# Patient Record
Sex: Female | Born: 1975 | Race: Black or African American | Hispanic: No | Marital: Single | Smoking: Former smoker
Health system: Southern US, Community
[De-identification: ages and names within clinical notes are randomized; demographics above are authoritative.]

## PROBLEM LIST (undated history)

## (undated) ENCOUNTER — Emergency Department (HOSPITAL_COMMUNITY): Admission: EM | Payer: Self-pay

## (undated) DIAGNOSIS — I10 Essential (primary) hypertension: Secondary | ICD-10-CM

## (undated) DIAGNOSIS — H532 Diplopia: Secondary | ICD-10-CM

## (undated) DIAGNOSIS — I6529 Occlusion and stenosis of unspecified carotid artery: Secondary | ICD-10-CM

## (undated) DIAGNOSIS — D509 Iron deficiency anemia, unspecified: Secondary | ICD-10-CM

## (undated) DIAGNOSIS — G939 Disorder of brain, unspecified: Secondary | ICD-10-CM

## (undated) HISTORY — DX: Occlusion and stenosis of unspecified carotid artery: I65.29

## (undated) HISTORY — DX: Diplopia: H53.2

## (undated) HISTORY — PX: TUBAL LIGATION: SHX77

---

## 1997-10-31 ENCOUNTER — Inpatient Hospital Stay (HOSPITAL_COMMUNITY): Admission: AD | Admit: 1997-10-31 | Discharge: 1997-10-31 | Payer: Self-pay | Admitting: Obstetrics

## 1997-11-30 ENCOUNTER — Inpatient Hospital Stay (HOSPITAL_COMMUNITY): Admission: AD | Admit: 1997-11-30 | Discharge: 1997-11-30 | Payer: Self-pay | Admitting: *Deleted

## 1997-11-30 ENCOUNTER — Encounter: Payer: Self-pay | Admitting: *Deleted

## 1997-12-30 ENCOUNTER — Inpatient Hospital Stay (HOSPITAL_COMMUNITY): Admission: AD | Admit: 1997-12-30 | Discharge: 1997-12-30 | Payer: Self-pay | Admitting: Obstetrics

## 1998-02-06 ENCOUNTER — Ambulatory Visit (HOSPITAL_COMMUNITY): Admission: RE | Admit: 1998-02-06 | Discharge: 1998-02-06 | Payer: Self-pay | Admitting: *Deleted

## 1998-03-17 ENCOUNTER — Inpatient Hospital Stay (HOSPITAL_COMMUNITY): Admission: AD | Admit: 1998-03-17 | Discharge: 1998-03-17 | Payer: Self-pay | Admitting: *Deleted

## 1998-03-18 ENCOUNTER — Ambulatory Visit (HOSPITAL_COMMUNITY): Admission: RE | Admit: 1998-03-18 | Discharge: 1998-03-18 | Payer: Self-pay | Admitting: *Deleted

## 1998-03-18 ENCOUNTER — Encounter: Payer: Self-pay | Admitting: *Deleted

## 1998-05-26 ENCOUNTER — Inpatient Hospital Stay (HOSPITAL_COMMUNITY): Admission: AD | Admit: 1998-05-26 | Discharge: 1998-05-26 | Payer: Self-pay | Admitting: *Deleted

## 1998-06-06 ENCOUNTER — Inpatient Hospital Stay (HOSPITAL_COMMUNITY): Admission: AD | Admit: 1998-06-06 | Discharge: 1998-06-06 | Payer: Self-pay | Admitting: *Deleted

## 1998-06-16 ENCOUNTER — Inpatient Hospital Stay (HOSPITAL_COMMUNITY): Admission: AD | Admit: 1998-06-16 | Discharge: 1998-06-16 | Payer: Self-pay | Admitting: *Deleted

## 1998-06-24 ENCOUNTER — Encounter: Payer: Self-pay | Admitting: *Deleted

## 1998-06-24 ENCOUNTER — Inpatient Hospital Stay (HOSPITAL_COMMUNITY): Admission: AD | Admit: 1998-06-24 | Discharge: 1998-06-24 | Payer: Self-pay | Admitting: *Deleted

## 1998-06-26 ENCOUNTER — Inpatient Hospital Stay (HOSPITAL_COMMUNITY): Admission: AD | Admit: 1998-06-26 | Discharge: 1998-06-28 | Payer: Self-pay | Admitting: *Deleted

## 1999-03-04 ENCOUNTER — Inpatient Hospital Stay (HOSPITAL_COMMUNITY): Admission: AD | Admit: 1999-03-04 | Discharge: 1999-03-04 | Payer: Self-pay | Admitting: *Deleted

## 1999-10-25 ENCOUNTER — Emergency Department (HOSPITAL_COMMUNITY): Admission: EM | Admit: 1999-10-25 | Discharge: 1999-10-25 | Payer: Self-pay | Admitting: Emergency Medicine

## 1999-10-25 ENCOUNTER — Encounter: Payer: Self-pay | Admitting: Emergency Medicine

## 1999-11-07 ENCOUNTER — Inpatient Hospital Stay (HOSPITAL_COMMUNITY): Admission: AD | Admit: 1999-11-07 | Discharge: 1999-11-07 | Payer: Self-pay | Admitting: *Deleted

## 2000-03-03 ENCOUNTER — Inpatient Hospital Stay (HOSPITAL_COMMUNITY): Admission: AD | Admit: 2000-03-03 | Discharge: 2000-03-03 | Payer: Self-pay | Admitting: *Deleted

## 2000-03-03 ENCOUNTER — Encounter: Payer: Self-pay | Admitting: *Deleted

## 2000-06-01 ENCOUNTER — Inpatient Hospital Stay (HOSPITAL_COMMUNITY): Admission: AD | Admit: 2000-06-01 | Discharge: 2000-06-01 | Payer: Self-pay | Admitting: *Deleted

## 2000-06-05 ENCOUNTER — Encounter (INDEPENDENT_AMBULATORY_CARE_PROVIDER_SITE_OTHER): Payer: Self-pay | Admitting: Specialist

## 2000-06-05 ENCOUNTER — Inpatient Hospital Stay (HOSPITAL_COMMUNITY): Admission: AD | Admit: 2000-06-05 | Discharge: 2000-06-07 | Payer: Self-pay | Admitting: *Deleted

## 2000-10-04 ENCOUNTER — Emergency Department (HOSPITAL_COMMUNITY): Admission: EM | Admit: 2000-10-04 | Discharge: 2000-10-04 | Payer: Self-pay | Admitting: Emergency Medicine

## 2001-10-02 ENCOUNTER — Emergency Department (HOSPITAL_COMMUNITY): Admission: EM | Admit: 2001-10-02 | Discharge: 2001-10-02 | Payer: Self-pay

## 2002-02-19 ENCOUNTER — Inpatient Hospital Stay (HOSPITAL_COMMUNITY): Admission: AD | Admit: 2002-02-19 | Discharge: 2002-02-19 | Payer: Self-pay | Admitting: *Deleted

## 2002-03-06 ENCOUNTER — Emergency Department (HOSPITAL_COMMUNITY): Admission: EM | Admit: 2002-03-06 | Discharge: 2002-03-06 | Payer: Self-pay | Admitting: Emergency Medicine

## 2002-08-15 ENCOUNTER — Emergency Department (HOSPITAL_COMMUNITY): Admission: EM | Admit: 2002-08-15 | Discharge: 2002-08-15 | Payer: Self-pay | Admitting: Emergency Medicine

## 2004-06-11 ENCOUNTER — Emergency Department (HOSPITAL_COMMUNITY): Admission: EM | Admit: 2004-06-11 | Discharge: 2004-06-11 | Payer: Self-pay | Admitting: Podiatry

## 2005-12-20 ENCOUNTER — Emergency Department (HOSPITAL_COMMUNITY): Admission: EM | Admit: 2005-12-20 | Discharge: 2005-12-20 | Payer: Self-pay | Admitting: Emergency Medicine

## 2008-01-11 ENCOUNTER — Emergency Department (HOSPITAL_COMMUNITY): Admission: EM | Admit: 2008-01-11 | Discharge: 2008-01-11 | Payer: Self-pay | Admitting: Emergency Medicine

## 2008-06-11 ENCOUNTER — Emergency Department (HOSPITAL_COMMUNITY): Admission: EM | Admit: 2008-06-11 | Discharge: 2008-06-11 | Payer: Self-pay | Admitting: Emergency Medicine

## 2009-12-30 ENCOUNTER — Emergency Department (HOSPITAL_COMMUNITY): Admission: EM | Admit: 2009-12-30 | Discharge: 2009-12-30 | Payer: Self-pay | Admitting: Emergency Medicine

## 2010-06-19 LAB — WET PREP, GENITAL: WBC, Wet Prep HPF POC: NONE SEEN

## 2010-06-19 LAB — POCT PREGNANCY, URINE: Preg Test, Ur: NEGATIVE

## 2010-06-19 LAB — URINALYSIS, ROUTINE W REFLEX MICROSCOPIC
Hgb urine dipstick: NEGATIVE
Nitrite: NEGATIVE
Protein, ur: NEGATIVE mg/dL
Specific Gravity, Urine: 1.016 (ref 1.005–1.030)
Urobilinogen, UA: 0.2 mg/dL (ref 0.0–1.0)

## 2010-07-17 LAB — BASIC METABOLIC PANEL
CO2: 30 mEq/L (ref 19–32)
Calcium: 8.7 mg/dL (ref 8.4–10.5)
GFR calc Af Amer: >60 mL/min (ref >60)
Potassium: 3.2 mEq/L — ABNORMAL LOW (ref 3.5–5.1)
Sodium: 139 mEq/L (ref 135–145)

## 2010-07-17 LAB — DIFFERENTIAL
Basophils Relative: 1 % (ref 0–1)
Lymphocytes Relative: 38 % (ref 12–46)
Monocytes Absolute: 0.4 10*3/uL (ref 0.1–1.0)
Monocytes Relative: 6 % (ref 3–12)
Neutro Abs: 3.5 10*3/uL (ref 1.7–7.7)

## 2010-07-17 LAB — CBC
HCT: 36.1 % (ref 36.0–46.0)
Hemoglobin: 12.5 g/dL (ref 12.0–15.0)
MCHC: 34.5 g/dL (ref 30.0–36.0)
RBC: 3.95 MIL/uL (ref 3.87–5.11)

## 2010-07-17 LAB — POCT CARDIAC MARKERS
CKMB, poc: <1 ng/mL — ABNORMAL LOW (ref 1.0–8.0)
Troponin i, poc: <0.05 ng/mL (ref 0.00–0.09)

## 2010-08-22 NOTE — Op Note (Signed)
[facility] of   Patient:    Connie Herrera, [name]                 MRN: [id number] Proc. Date: 06/06/00 Adm. Date:  [id number] Attending:  Deniece Ree                           Operative Report  PREOPERATIVE DIAGNOSIS:       Multiparity, desirous of permanent sterilization.  POSTOPERATIVE DIAGNOSIS:      Multiparity, desirous of permanent sterilization.  OPERATION:                    Bilateral tubal ligation using the modified                               Pomeroy procedure.  SURGEON:                      Deniece Ree, M.D.  ANESTHESIA:                   Epidural, Dr. Pamalee Leyden.  ESTIMATED BLOOD LOSS:         Less than 25 cc.  COMPLICATIONS:                None.  DISPOSITION:                  The patient tolerated the procedure well and returned to the recovery room in satisfactory condition.  DESCRIPTION OF PROCEDURE:     The patient was taken to the operating room, prepped and draped in the usual fashion for a postpartum sterilization procedure. A subumbilical incision was made. This was carried down to the peritoneum and fascia, at which time, with some manipulation, on the entrance of the abdominal cavity, the right tube was identified, grasped with the Babcock clamp, and followed out until the fimbriated end could be identified. This segment was then knuckled up and utilizing 0 plain catgut, ligated in a routine modified Pomeroy procedure pattern. This was done likewise on the opposite side. Both segments of tube were then labeled and sent to pathology. Both tubal stumps areas were then cauterized with the use of a cautery. Hemostasis was present. Sponge and needle count was correct x 2. The peritoneum and fascia were then closed using a #1 chromic in a running locking stitch followed by a subcuticular stitch using 4-0 Vicryl. The procedure then terminated. The patient tolerated the procedure well and returned to the recovery room in  satisfactory condition. DD:  06/06/00 TD:  06/06/00 Job: [id number]

## 2010-08-22 NOTE — H&P (Signed)
[facility] of [place]  Patient:    Connie Herrera, Connie Herrera                 MRN: [id number] Adm. Date:  [id number] Attending:  Deniece Ree                         History and Physical  HISTORY:                      The patient is a 35 year old multiparous female, status post vaginal delivery who is desirous of permanent sterilization. The patient has discussed the different types of reversible type of contraceptives that are available, however, if very adamant about having permanent sterilization. She understands that this procedure is intended to be permanent, however, cannot be guaranteed. All of her questions have been answered and possible complications explained which the patient accepts.  PAST MEDICAL HISTORY:         Noncontributory.  FAMILY HISTORY:               Noncontributory.  REVIEW OF SYSTEMS:            Noncontributory.  PHYSICAL EXAMINATION:  GENERAL:                      Well-developed, well-nourished postpartum female in no acute distress.  HEENT:                        Within normal limits.  NECK:                         Supple.  BREASTS:                      Without masses, tenderness, or discharge.  LUNGS:                        Clear to percussion and auscultation.  HEART:                        Normal sinus rhythm without murmur, rub, or gallop.  ABDOMEN:                      Benign with uterine fundus extended to the umbilicus.  EXTREMITIES AND NEUROLOGIC:   Within normal limits.  PELVIC:                       Not done.  DIAGNOSIS:                    Multiparity, desirous of permanent sterilization.  PLAN:                         The plan is for a bilateral tubal ligation using the modified Pomeroy procedure. DD:  06/06/00 TD:  06/06/00 Job: [id number]

## 2011-01-06 LAB — POCT I-STAT, CHEM 8
BUN: 5 — ABNORMAL LOW
Chloride: 101
Potassium: 3.1 — ABNORMAL LOW
Sodium: 139
TCO2: 29

## 2011-01-06 LAB — URINALYSIS, ROUTINE W REFLEX MICROSCOPIC
Nitrite: NEGATIVE
Specific Gravity, Urine: 1.037 — ABNORMAL HIGH
Urobilinogen, UA: 2 — ABNORMAL HIGH

## 2011-01-06 LAB — URINE MICROSCOPIC-ADD ON

## 2011-01-06 LAB — URINE CULTURE

## 2011-05-31 ENCOUNTER — Emergency Department (HOSPITAL_COMMUNITY)
Admission: EM | Admit: 2011-05-31 | Discharge: 2011-05-31 | Disposition: A | Discharge status: Home / Self Care | Payer: Self-pay | Attending: Emergency Medicine | Admitting: Emergency Medicine

## 2011-05-31 ENCOUNTER — Encounter (HOSPITAL_COMMUNITY): Payer: Self-pay | Admitting: Emergency Medicine

## 2011-05-31 DIAGNOSIS — F17200 Nicotine dependence, unspecified, uncomplicated: Secondary | ICD-10-CM | POA: Insufficient documentation

## 2011-05-31 DIAGNOSIS — R51 Headache: Secondary | ICD-10-CM | POA: Insufficient documentation

## 2011-05-31 DIAGNOSIS — R519 Headache, unspecified: Secondary | ICD-10-CM

## 2011-05-31 MED ORDER — DEXAMETHASONE SODIUM PHOSPHATE 10 MG/ML IJ SOLN
10.0000 mg | Freq: Once | INTRAMUSCULAR | Status: AC
  Administered 2011-05-31: 10 mg via INTRAVENOUS
  Filled 2011-05-31: qty 1

## 2011-05-31 MED ORDER — SODIUM CHLORIDE 0.9 % IV BOLUS (SEPSIS)
1000.0000 mL | Freq: Once | INTRAVENOUS | Status: AC
  Administered 2011-05-31: 1000 mL via INTRAVENOUS

## 2011-05-31 MED ORDER — METOCLOPRAMIDE HCL 5 MG/ML IJ SOLN
10.0000 mg | Freq: Once | INTRAMUSCULAR | Status: AC
  Administered 2011-05-31: 10 mg via INTRAVENOUS
  Filled 2011-05-31: qty 2

## 2011-05-31 MED ORDER — KETOROLAC TROMETHAMINE 30 MG/ML IJ SOLN
30.0000 mg | Freq: Once | INTRAMUSCULAR | Status: AC
  Administered 2011-05-31: 30 mg via INTRAVENOUS
  Filled 2011-05-31: qty 1

## 2011-05-31 MED ORDER — DIPHENHYDRAMINE HCL 50 MG/ML IJ SOLN
25.0000 mg | Freq: Once | INTRAMUSCULAR | Status: AC
  Administered 2011-05-31: 25 mg via INTRAVENOUS
  Filled 2011-05-31: qty 1

## 2011-05-31 NOTE — ED Provider Notes (Signed)
History     CSN: [id number]  Arrival date & time 05/31/11  1819   First MD Initiated Contact with Patient 05/31/11 1908      Chief Complaint  Patient presents with  . Headache    (Consider location/radiation/quality/duration/timing/severity/associated sxs/prior treatment) Patient is a 36 y.o. female presenting with headaches. The history is provided by the patient. No language interpreter was used.  Headache  This is a new problem. The current episode started 2 days ago. The problem occurs constantly. The problem has been gradually worsening. The headache is associated with bright light and loud noise. The pain is located in the frontal and temporal region. The pain is moderate. The pain does not radiate. Associated symptoms include nausea. Pertinent negatives include no anorexia, no fever, no malaise/fatigue, no near-syncope, no orthopnea, no palpitations, no shortness of breath and no vomiting. She has tried nothing for the symptoms. The treatment provided no relief.    History reviewed. No pertinent past medical history.  Past Surgical History  Procedure Date  . Tubal ligation     No family history on file.  History  Substance Use Topics  . Smoking status: Current Everyday Smoker  . Smokeless tobacco: Not on file  . Alcohol Use: No    OB History    Grav Para Term Preterm Abortions TAB SAB Ect Mult Living                  Review of Systems  Constitutional: Negative for fever, malaise/fatigue, activity change, appetite change and fatigue.  HENT: Negative for congestion, sore throat, rhinorrhea, neck pain and neck stiffness.   Eyes: Positive for photophobia. Negative for pain and visual disturbance.  Respiratory: Negative for cough and shortness of breath.   Cardiovascular: Negative for chest pain, palpitations, orthopnea and near-syncope.  Gastrointestinal: Positive for nausea. Negative for vomiting, abdominal pain and anorexia.  Genitourinary: Negative for dysuria,  urgency, frequency and flank pain.  Musculoskeletal: Negative for myalgias, back pain and arthralgias.  Neurological: Positive for headaches. Negative for dizziness, weakness, light-headedness and numbness.  All other systems reviewed and are negative.    Allergies  Review of patient's allergies indicates no known allergies.  Home Medications   Current Outpatient Rx  Name Route Sig Dispense Refill  . ACETAMINOPHEN 500 MG PO TABS Oral Take 1,000 mg by mouth every 6 (six) hours as needed. For pain      BP 149/87  Pulse 68  Temp(Src) 98.2 F (36.8 C) (Oral)  Resp 16  SpO2 100%  LMP 05/31/2011  Physical Exam  Nursing note and vitals reviewed. Constitutional: She is oriented to person, place, and time. She appears well-developed and well-nourished. No distress.       Uncomfortable appearing  HENT:  Head: Normocephalic and atraumatic.  Mouth/Throat: Oropharynx is clear and moist.  Eyes: Conjunctivae and EOM are normal. Pupils are equal, round, and reactive to light.       photophobia  Neck: Normal range of motion. Neck supple.  Cardiovascular: Normal rate, regular rhythm, normal heart sounds and intact distal pulses.  Exam reveals no gallop and no friction rub.   No murmur heard. Pulmonary/Chest: Effort normal and breath sounds normal. No respiratory distress.  Abdominal: Soft. Bowel sounds are normal. There is no tenderness.  Musculoskeletal: Normal range of motion. She exhibits no tenderness.  Neurological: She is alert and oriented to person, place, and time. She has normal strength. No cranial nerve deficit or sensory deficit.  Skin: Skin is warm and dry.  No rash noted.    ED Course  Procedures (including critical care time)  Labs Reviewed - No data to display No results found.   1. Cephalgia       MDM  Migrainous type headache. I have no concern about a malignant cause of headache such as subarachnoid hemorrhage or meningitis. There is no indication for imaging  at this time. Headache was gradual in onset and has been intermittent. She received a headache cocktail. She was signed out to my colleague Ivonne Kateri Balch, PA-C. He will reevaluate the patient if she's feeling better she'll be discharged home with instructions to followup with her primary care physician.        Dayton Bailiff, MD 05/31/11 2000

## 2011-05-31 NOTE — ED Notes (Addendum)
C/o headache, nausea, and dizziness x 2 days.  States high bp at work today (152/108).

## 2011-05-31 NOTE — Discharge Instructions (Signed)

## 2012-01-09 ENCOUNTER — Encounter (HOSPITAL_COMMUNITY): Payer: Self-pay | Admitting: *Deleted

## 2012-01-09 ENCOUNTER — Emergency Department (HOSPITAL_COMMUNITY): Payer: Self-pay

## 2012-01-09 ENCOUNTER — Emergency Department (HOSPITAL_COMMUNITY)
Admission: EM | Admit: 2012-01-09 | Discharge: 2012-01-09 | Disposition: A | Discharge status: Home / Self Care | Payer: Self-pay | Attending: Emergency Medicine | Admitting: Emergency Medicine

## 2012-01-09 DIAGNOSIS — R209 Unspecified disturbances of skin sensation: Secondary | ICD-10-CM | POA: Insufficient documentation

## 2012-01-09 DIAGNOSIS — R29898 Other symptoms and signs involving the musculoskeletal system: Secondary | ICD-10-CM

## 2012-01-09 NOTE — ED Provider Notes (Signed)
History     CSN: [id number]  Arrival date & time 01/09/12  0132   First MD Initiated Contact with Patient 01/09/12 [phone]      Chief Complaint  Patient presents with  . Numbness    "L arm heaviness"    (Consider location/radiation/quality/duration/timing/severity/associated sxs/prior treatment) HPI Comments: 36 year old female who presents with a complaint of left upper extremity weakness. She states that the arm feels heavy. She awoke with the symptoms on Thursday morning, they have been constant. Initially she had pain as well as a heavy feeling but now the pain has gone away and she has residual heaviness. She states that the arm is not numb and she is able to use the hand to perform normal activities of daily living. She does not recall sleeping in an abnormal way and she denies any problems with her legs, her vision, or her speech. The symptoms are constant, mild, nothing makes better or worse, no associated fevers chills nausea vomiting diarrhea dysuria swelling rashes chest pain cough or shortness of breath.  The history is provided by the patient.    History reviewed. No pertinent past medical history.  Past Surgical History  Procedure Date  . Tubal ligation   . Tubal ligation     No family history on file.  History  Substance Use Topics  . Smoking status: Current Every Day Smoker  . Smokeless tobacco: Not on file  . Alcohol Use: No    OB History    Grav Para Term Preterm Abortions TAB SAB Ect Mult Living                  Review of Systems  All other systems reviewed and are negative.    Allergies  Hydrocodone  Home Medications  No current outpatient prescriptions on file.  BP 146/93  Pulse 84  Temp 98.3 F (36.8 C) (Oral)  Resp 18  SpO2 100%  LMP 12/16/2011  Physical Exam  Nursing note and vitals reviewed. Constitutional: She appears well-developed and well-nourished. No distress.  HENT:  Head: Normocephalic and atraumatic.  Mouth/Throat:  Oropharynx is clear and moist. No oropharyngeal exudate.  Eyes: Conjunctivae normal and EOM are normal. Pupils are equal, round, and reactive to light. Right eye exhibits no discharge. Left eye exhibits no discharge. No scleral icterus.  Neck: Normal range of motion. Neck supple. No JVD present. No thyromegaly present.  Cardiovascular: Normal rate, regular rhythm, normal heart sounds and intact distal pulses.  Exam reveals no gallop and no friction rub.   No murmur heard. Pulmonary/Chest: Effort normal and breath sounds normal. No respiratory distress. She has no wheezes. She has no rales.  Abdominal: Soft. Bowel sounds are normal. She exhibits no distension and no mass. There is no tenderness.  Musculoskeletal: Normal range of motion. She exhibits no edema and no tenderness.  Lymphadenopathy:    She has no cervical adenopathy.  Neurological: She is alert. Coordination normal.       Neurologic exam:  Speech clear, pupils equal round reactive to light, extraocular movements intact  Normal peripheral visual fields Cranial nerves III through XII normal including no facial droop Follows commands, moves all extremities x4, normal strength to bilateral upper and lower extremities at all major muscle groups including grip - except for the left upper extremity at the wrist where she has mild weakness to dorsi flexion and mild weakness to the thumb extension. Sensation normal to light touch and pinprick Coordination intact, no limb ataxia, finger-nose-finger normal Rapid alternating  movements normal No pronator drift Gait normal   Skin: Skin is warm and dry. No rash noted. No erythema.  Psychiatric: She has a normal mood and affect. Her behavior is normal.    ED Course  Procedures (including critical care time)  Labs Reviewed - No data to display Mr Brain Wo Contrast  01/09/2012  *RADIOLOGY REPORT*  Clinical Data: Right arm heaviness.  left arm weakness.  Stroke versus radial nerve palsy.  MRI  HEAD WITHOUT CONTRAST  Technique:  Multiplanar, multiecho pulse sequences of the brain and surrounding structures were obtained according to standard protocol without intravenous contrast.  Comparison: None.  Findings: Diffusion imaging does not show any acute or subacute infarction.  The brainstem and cerebellum are normal.  Within the cerebral hemispheres, there are numerous scattered foci of T Q and FLAIR signal within the hemispheric white matter, more within the subcortical than the deep white matter.  No cortical or large vessel territory insult.  No mass lesion, hemorrhage, hydrocephalus or extra-axial collection.  No pituitary mass.  There is a small amount of fluid in the left frontal sinus region.  No skull or skull base lesion.  IMPRESSION: No acute infarction.  Numerous foci of abnormal white matter signal within the cerebral hemispheres.  This raises concern for demyelinating disease/multiple sclerosis.  The differential diagnosis includes an early manifestation small vessel disease, migraine related foci and changes related old head trauma.   Original Report Authenticated By: Thomasenia Sales, M.D.      1. Weakness of left arm       MDM  Overall the patient appears very well, she has a heavy feeling to the left upper extremity but normal sensation to light touch and pinprick. She has normal strength except for the radial nerve distribution. I suspect a Saturday night palsy back/radial nerve palsy, MRI in the morning to rule out other sources such as stroke though the patient is low risk.  Change of shift - care signed out to Dr. Fae Pippin, MD 01/10/12 [phone]

## 2012-01-09 NOTE — ED Notes (Signed)
Patient transported to MRI 

## 2012-01-09 NOTE — ED Notes (Signed)
Pt states that for a couple of days she has been having pain in her left arm. Pt states that the pain has decreased today but that her arm went "heavy." pt states feels like numbness, tinglining. Pt has no neuro deficits. Pt has equal strength bilateral arms and legs. Pt is alert and oriented, able to move arm and follow commands.

## 2012-01-09 NOTE — ED Provider Notes (Addendum)
Pt MRI concerning for MS. Discussed with Dr Amada Jupiter. Stated admission not required and no emergent treatment at this time. Suggested she get MRI with contrast for more certainty of diagnosis. Pt needs to f/u with neurology as outpt. I discussed with pt the MRI results. Explained the need to f/u. Offered MRI with contrast at this time but she stated she would rather complete workup as outpt. Encouraged to return for worsening symptoms.   Loren Racer, MD 01/09/12 1478  Loren Racer, MD 01/09/12 [phone]

## 2012-01-09 NOTE — ED Notes (Signed)
Pt resting with eyes closed.  Awakens to verbal stimuli.  States that her arm is still "tingling".  Neuro check negative.  Resp symmetrical and unlabored.

## 2012-01-09 NOTE — ED Notes (Signed)
C/o L arm heaviness onset Thursday (2d ago), describes as started as pain, then became heavy, pain now is not as bad as it was initially. Took aleve and tylenol ~ 3 hrs ago. CMS intact, ROM intact.

## 2012-01-11 ENCOUNTER — Emergency Department (HOSPITAL_COMMUNITY)
Admission: EM | Admit: 2012-01-11 | Discharge: 2012-01-11 | Disposition: A | Discharge status: Home / Self Care | Payer: Self-pay | Attending: Emergency Medicine | Admitting: Emergency Medicine

## 2012-01-11 ENCOUNTER — Emergency Department (HOSPITAL_COMMUNITY): Payer: Self-pay

## 2012-01-11 ENCOUNTER — Encounter (HOSPITAL_COMMUNITY): Payer: Self-pay | Admitting: *Deleted

## 2012-01-11 DIAGNOSIS — F17200 Nicotine dependence, unspecified, uncomplicated: Secondary | ICD-10-CM | POA: Insufficient documentation

## 2012-01-11 DIAGNOSIS — M25569 Pain in unspecified knee: Secondary | ICD-10-CM | POA: Insufficient documentation

## 2012-01-11 MED ORDER — OXYCODONE-ACETAMINOPHEN 5-325 MG PO TABS: 1.0000 | ORAL_TABLET | Freq: Four times a day (QID) | ORAL | Status: DC | PRN

## 2012-01-11 MED ORDER — KETOROLAC TROMETHAMINE 60 MG/2ML IM SOLN
60.0000 mg | Freq: Once | INTRAMUSCULAR | Status: AC
  Administered 2012-01-11: 60 mg via INTRAMUSCULAR
  Filled 2012-01-11: qty 2

## 2012-01-11 MED ORDER — OXYCODONE-ACETAMINOPHEN 5-325 MG PO TABS
1.0000 | ORAL_TABLET | Freq: Once | ORAL | Status: AC
  Administered 2012-01-11: 1 via ORAL
  Filled 2012-01-11: qty 1

## 2012-01-11 MED ORDER — IBUPROFEN 800 MG PO TABS: 800.0000 mg | ORAL_TABLET | Freq: Three times a day (TID) | ORAL | Status: DC | PRN

## 2012-01-11 NOTE — ED Notes (Signed)
Pt was seen here on Friday and has MRI because they thought she had a stroke.  Pt sts she was told she may have MS.  Pt states left leg has been burning for 2 days.Marland Kitchen

## 2012-01-11 NOTE — ED Provider Notes (Signed)
History     CSN: [id number]  Arrival date & time 01/11/12  1118   First MD Initiated Contact with Patient 01/11/12 1130      No chief complaint on file.   (Consider location/radiation/quality/duration/timing/severity/associated sxs/prior treatment) HPI Patient presents emergency department with left knee pain, has been a chronic issue over the last several year.  Patient, states, that she stands a lot at work, and she's had problems related to this job with her knees.  Patient, states, that she's not having numbness, or weakness in her extremity. patient denies swelling in the knee joint.  Patient denies nausea, vomiting, fever, or trauma.  History reviewed. No pertinent past medical history.  Past Surgical History  Procedure Date  . Tubal ligation   . Tubal ligation     No family history on file.  History  Substance Use Topics  . Smoking status: Current Every Day Smoker  . Smokeless tobacco: Not on file  . Alcohol Use: No    OB History    Grav Para Term Preterm Abortions TAB SAB Ect Mult Living                  Review of Systems All other systems negative except as documented in the HPI. All pertinent positives and negatives as reviewed in the HPI.  Allergies  Hydrocodone  Home Medications  No current outpatient prescriptions on file.  BP 132/88  Pulse 78  Temp 98.5 F (36.9 C) (Oral)  Resp 18  SpO2 97%  LMP 12/16/2011  Physical Exam  Nursing note and vitals reviewed. Constitutional: She appears well-developed and well-nourished.  Cardiovascular: Normal rate and regular rhythm.   Pulmonary/Chest: Effort normal and breath sounds normal.  Musculoskeletal:       Left knee: She exhibits normal range of motion, no swelling, no effusion, no ecchymosis, no deformity, normal alignment, no LCL laxity, normal patellar mobility and no MCL laxity. tenderness found. Medial joint line tenderness noted.    ED Course  Procedures (including critical care time)  Labs  Reviewed - No data to display Dg Knee Complete 4 Views Left  01/11/2012  *RADIOLOGY REPORT*  Clinical Data: Left knee pain for 3 days with burning sensation.  LEFT KNEE - COMPLETE 4+ VIEW  Comparison: 12/30/2009  Findings: No fracture, knee effusion, or acute bony findings.  IMPRESSION:  1.  No significant abnormality identified.   Original Report Authenticated By: Dellia Cloud, M.D.    Patient be referred to orthopedics as needed. Patient is advised to use ice and heat along with elevation for her left knee pain.  Patient is advised return here for any worsening in her condition.patient seen here on Friday for.  The possible code stroke on the head.  No signs of stroke on MRI.  Patient needs referral to neurology.  MDM  MDM Reviewed: vitals, nursing note and previous chart Interpretation: x-ray            Carlyle Dolly, PA-C 01/11/12 1518

## 2012-01-11 NOTE — ED Provider Notes (Signed)
Medical screening examination/treatment/procedure(s) were performed by non-physician practitioner and as supervising physician I was immediately available for consultation/collaboration.  Bernadette Armijo, MD 01/11/12 1713 

## 2012-01-11 NOTE — ED Notes (Signed)
Lights dimmed in room. Pt awaiting to go to xray

## 2012-01-11 NOTE — ED Notes (Signed)
Pt here for pain in left leg since Friday and on Friday was seen here for left arm pain, had MRI and told may have MS.

## 2012-03-02 ENCOUNTER — Encounter (HOSPITAL_COMMUNITY): Payer: Self-pay | Admitting: *Deleted

## 2012-03-02 ENCOUNTER — Emergency Department (HOSPITAL_COMMUNITY)
Admission: EM | Admit: 2012-03-02 | Discharge: 2012-03-02 | Disposition: A | Discharge status: Home / Self Care | Payer: Self-pay | Attending: Emergency Medicine | Admitting: Emergency Medicine

## 2012-03-02 DIAGNOSIS — N644 Mastodynia: Secondary | ICD-10-CM | POA: Insufficient documentation

## 2012-03-02 DIAGNOSIS — F17200 Nicotine dependence, unspecified, uncomplicated: Secondary | ICD-10-CM | POA: Insufficient documentation

## 2012-03-02 DIAGNOSIS — G35 Multiple sclerosis: Secondary | ICD-10-CM | POA: Insufficient documentation

## 2012-03-02 MED ORDER — ONDANSETRON 8 MG PO TBDP
8.0000 mg | ORAL_TABLET | Freq: Once | ORAL | Status: AC
  Administered 2012-03-02: 8 mg via ORAL
  Filled 2012-03-02: qty 1

## 2012-03-02 MED ORDER — HYDROCODONE-ACETAMINOPHEN 5-325 MG PO TABS
2.0000 | ORAL_TABLET | Freq: Once | ORAL | Status: AC
  Administered 2012-03-02: 2 via ORAL
  Filled 2012-03-02: qty 2

## 2012-03-02 MED ORDER — HYDROCODONE-ACETAMINOPHEN 5-325 MG PO TABS: 1.0000 | ORAL_TABLET | ORAL | Status: DC | PRN

## 2012-03-02 MED ORDER — ONDANSETRON HCL 8 MG PO TABS: 8.0000 mg | ORAL_TABLET | Freq: Three times a day (TID) | ORAL | Status: DC | PRN

## 2012-03-02 NOTE — ED Notes (Signed)
Pt states knot to right breast that has grown in size in the past 3 days. Increased pain with tight fitting bra. NAD noted at this time.

## 2012-03-02 NOTE — ED Provider Notes (Signed)
Medical screening examination/treatment/procedure(s) were performed by non-physician practitioner and as supervising physician I was immediately available for consultation/collaboration.  Briahna Pescador, MD 03/02/12 0518 

## 2012-03-02 NOTE — ED Notes (Signed)
Pt c/o knot in right breast x 3 days; painful; states has gotten "huge"

## 2012-03-02 NOTE — ED Provider Notes (Signed)
History     CSN: [id number]  Arrival date & time 03/02/12  0047   First MD Initiated Contact with Patient 03/02/12 0057      Chief Complaint  Patient presents with  . Knot to right breast     (Consider location/radiation/quality/duration/timing/severity/associated sxs/prior treatment) HPI History provided by pt.   She has had a painful mass in right breast for the past three days.  Aggravated by palpation.  No associated fever, skin changes, discharge or bleeding from nipple.  She is not on her period currently.  FH breast cancer but at old age.  Denies trauma.   Past Medical History  Diagnosis Date  . Multiple sclerosis     Past Surgical History  Procedure Date  . Tubal ligation   . Tubal ligation     No family history on file.  History  Substance Use Topics  . Smoking status: Current Every Day Smoker -- 0.5 packs/day  . Smokeless tobacco: Not on file  . Alcohol Use: No    OB History    Grav Para Term Preterm Abortions TAB SAB Ect Mult Living                  Review of Systems  All other systems reviewed and are negative.    Allergies  Hydrocodone  Home Medications   Current Outpatient Rx  Name  Route  Sig  Dispense  Refill  . HYDROCODONE-ACETAMINOPHEN 5-325 MG PO TABS   Oral   Take 1 tablet by mouth every 4 (four) hours as needed for pain.   20 tablet   0   . ONDANSETRON HCL 8 MG PO TABS   Oral   Take 1 tablet (8 mg total) by mouth every 8 (eight) hours as needed for nausea.   20 tablet   0     BP 139/94  Pulse 96  Temp 99.4 F (37.4 C) (Oral)  Resp 24  SpO2 99%  LMP 02/19/2012  Physical Exam  Nursing note and vitals reviewed. Constitutional: She is oriented to person, place, and time. She appears well-developed and well-nourished. No distress.  HENT:  Head: Normocephalic and atraumatic.  Eyes:       Normal appearance  Neck: Normal range of motion.  Pulmonary/Chest: Effort normal.       Breast symmetrical.  L and R breast tissue  feels similar; both diffusely fibrocystic and no obvious mass R breast.  Tenderness superior to nipple.  Nml nipple w/out drainage.  No dimpling or rash of skin nor abscess.    Musculoskeletal: Normal range of motion.  Neurological: She is alert and oriented to person, place, and time.  Psychiatric: She has a normal mood and affect. Her behavior is normal.    ED Course  Procedures (including critical care time)  Labs Reviewed - No data to display No results found.   1. Breast pain       MDM  36yo F presents w/ c/o non-traumatic, painful R breast mass x 3 days.  Afebrile, fibrocystic breast tissue bilaterally w/out obvious palpable mass nor skin or nipple changes on exam, but tenderness superior to R nipple.  [facility].  She is seen at the [facility] who will likely be able to follow her for this problem.  Prescribed vicodin.  Return precautions discussed.         Otilio Miu, PA-C 03/02/12 0425

## 2012-03-02 NOTE — ED Notes (Signed)
Pt noted to be tearful. Medication given for pain and nausea.  Pt denies any other needs at this time .

## 2012-03-11 ENCOUNTER — Ambulatory Visit (HOSPITAL_COMMUNITY)
Admission: RE | Admit: 2012-03-11 | Discharge: 2012-03-11 | Disposition: A | Discharge status: Home / Self Care | Payer: Self-pay | Source: Ambulatory Visit | Attending: Obstetrics and Gynecology | Admitting: Obstetrics and Gynecology

## 2012-03-11 ENCOUNTER — Encounter (HOSPITAL_COMMUNITY): Payer: Self-pay

## 2012-03-11 ENCOUNTER — Other Ambulatory Visit: Payer: Self-pay | Admitting: Obstetrics and Gynecology

## 2012-03-11 VITALS — BP 136/72 | Temp 98.9°F | Ht 68.0 in | Wt 186.4 lb

## 2012-03-11 DIAGNOSIS — N644 Mastodynia: Secondary | ICD-10-CM

## 2012-03-11 DIAGNOSIS — Z1239 Encounter for other screening for malignant neoplasm of breast: Secondary | ICD-10-CM

## 2012-03-11 NOTE — Patient Instructions (Signed)
Taught patient how to perform BSE and gave educational materials to take home. Patient needs to schedule a Pap smear due to last Pap smear was > 3 years ago. Patient has been given Sabrina's number to call and schedule. Patient referred to the [facility] for bilateral Diagnostic Mammogram and possible right breast ultrasound. Appointment scheduled for Tuesday, March 22, 2012 at 0920. Patient aware of appointment and will be there. Patient verbalized understanding.

## 2012-03-11 NOTE — Progress Notes (Signed)
Complaints of right breast lump and pain x 2 weeks. Patient rates pain at a 8-9 out of 10.   Pap Smear:    Pap smear not performed today due to patient had lumbar puncture yesterday and too uncomfortable for her to put feet in stirrups. Patient will call to reschedule Pap smear. Per patient last Pap smear was > 3 years at [facility] and normal. Per patient she has no history of abnormal Pap smears. Pap smear result above is in EPIC.  Physical exam: Breasts Breasts symmetrical. No skin abnormalities bilateral breasts. No nipple retraction bilateral breasts. No nipple discharge bilateral breasts. No lymphadenopathy. No lumps palpated bilateral breasts. Patient complained of pain right upper breast on exam. Patient guarded and unable to complete exam due to patients pain. Patient referred to the [facility] for bilateral Diagnostic Mammogram and possible right breast ultrasound. Appointment scheduled for Tuesday, March 22, 2012 at 0920.       Pelvic/Bimanual No Pap smear completed today since patient had lumbar puncture yesterday and was too uncomfortable to put her feet in stirrups. Patient will call to reschedule Pap smear.

## 2012-03-17 ENCOUNTER — Telehealth (HOSPITAL_COMMUNITY): Payer: Self-pay | Admitting: *Deleted

## 2012-03-17 NOTE — Telephone Encounter (Signed)
Telephoned home # and voicemail not set up. Telephoned mobile # and not valid #.

## 2012-03-23 ENCOUNTER — Telehealth (HOSPITAL_COMMUNITY): Payer: Self-pay | Admitting: *Deleted

## 2012-03-23 NOTE — Telephone Encounter (Signed)
Telephoned patient and left message

## 2012-04-07 NOTE — Addendum Note (Signed)
Encounter addended by: Christine Poll Brannock, RN on: 04/07/2012  4:52 PM<BR>     Documentation filed: Charges VN

## 2012-06-22 ENCOUNTER — Emergency Department (HOSPITAL_COMMUNITY)
Admission: EM | Admit: 2012-06-22 | Discharge: 2012-06-22 | Disposition: A | Discharge status: Home / Self Care | Payer: Self-pay | Attending: Emergency Medicine | Admitting: Emergency Medicine

## 2012-06-22 ENCOUNTER — Emergency Department (HOSPITAL_COMMUNITY): Payer: Self-pay

## 2012-06-22 ENCOUNTER — Encounter (HOSPITAL_COMMUNITY): Payer: Self-pay | Admitting: *Deleted

## 2012-06-22 DIAGNOSIS — S63509A Unspecified sprain of unspecified wrist, initial encounter: Secondary | ICD-10-CM | POA: Insufficient documentation

## 2012-06-22 DIAGNOSIS — Z8669 Personal history of other diseases of the nervous system and sense organs: Secondary | ICD-10-CM | POA: Insufficient documentation

## 2012-06-22 DIAGNOSIS — F17200 Nicotine dependence, unspecified, uncomplicated: Secondary | ICD-10-CM | POA: Insufficient documentation

## 2012-06-22 DIAGNOSIS — Y9389 Activity, other specified: Secondary | ICD-10-CM | POA: Insufficient documentation

## 2012-06-22 DIAGNOSIS — X58XXXA Exposure to other specified factors, initial encounter: Secondary | ICD-10-CM | POA: Insufficient documentation

## 2012-06-22 DIAGNOSIS — Y929 Unspecified place or not applicable: Secondary | ICD-10-CM | POA: Insufficient documentation

## 2012-06-22 MED ORDER — IBUPROFEN 800 MG PO TABS
800.0000 mg | ORAL_TABLET | Freq: Once | ORAL | Status: AC
  Administered 2012-06-22: 800 mg via ORAL
  Filled 2012-06-22: qty 1

## 2012-06-22 NOTE — ED Provider Notes (Signed)
Medical screening examination/treatment/procedure(s) were performed by non-physician practitioner and as supervising physician I was immediately available for consultation/collaboration.    Anushree Dorsi R Lando Alcalde, MD 06/22/12 2349 

## 2012-06-22 NOTE — ED Notes (Signed)
Pt states she hurt her left arm a few wks ago "playing"; thought it would go away but continues to have lower left arm/wrist pain

## 2012-06-22 NOTE — ED Provider Notes (Signed)
History    This chart was scribed for non-physician practitioner working with Celene Kras, MD by Leone Payor, ED Scribe. This patient was seen in room WTR9/WTR9 and the patient's care was started at 1917.   CSN: [id number]  Arrival date & time 06/22/12  4098   First MD Initiated Contact with Patient 06/22/12 1946      Chief Complaint  Patient presents with  . Arm Pain     The history is provided by the patient. No language interpreter was used.    Connie Herrera is a 37 y.o. female who presents to the Emergency Department complaining of ongoing, unchanged, constant, left arm swelling with mild pain that started after "playing" about 2 weeks ago. Pt states she thought it would go away but continues to have lower left arm/wrist pain. States she took advil with moderate relief.  She denies numbness, tingling.    Pt is a current everyday smoker but denies alcohol use.  Past Medical History  Diagnosis Date  . Multiple sclerosis     Past Surgical History  Procedure Laterality Date  . Tubal ligation    . Tubal ligation      Family History  Problem Relation Age of Onset  . Cancer Maternal Grandmother 67    breast    History  Substance Use Topics  . Smoking status: Current Every Day Smoker -- 0.50 packs/day  . Smokeless tobacco: Not on file  . Alcohol Use: No    OB History   Grav Para Term Preterm Abortions TAB SAB Ect Mult Living   4 4 4       4       Review of Systems A complete 10 system review of systems was obtained and all systems are negative except as noted in the HPI and PMH.   Allergies  Hydrocodone  Home Medications   Current Outpatient Rx  Name  Route  Sig  Dispense  Refill  . ibuprofen (ADVIL,MOTRIN) 200 MG tablet   Oral   Take 400 mg by mouth every 6 (six) hours as needed for pain.           BP 128/83  Pulse 98  Temp(Src) 97.8 F (36.6 C)  Resp 20  SpO2 100%  LMP 06/04/2012  Physical Exam  Nursing note and vitals  reviewed. Constitutional: She is oriented to person, place, and time. She appears well-developed and well-nourished. No distress.  HENT:  Head: Normocephalic and atraumatic.  Eyes: EOM are normal.  Neck: Neck supple. No tracheal deviation present.  Cardiovascular: Normal rate.   Pulmonary/Chest: Effort normal. No respiratory distress.  Musculoskeletal: Normal range of motion.  Swelling in forearm. No bruising, abrasion, or laceration.  Neurological: She is alert and oriented to person, place, and time.  Skin: Skin is warm and dry.  Psychiatric: She has a normal mood and affect. Her behavior is normal.    ED Course  Procedures (including critical care time)  DIAGNOSTIC STUDIES: Oxygen Saturation is 100% on room air, normal by my interpretation.    COORDINATION OF CARE: 7:56 PM Discussed treatment plan with pt at bedside and pt agreed to plan.    Labs Reviewed - No data to display Dg Wrist Complete Left  06/22/2012  *RADIOLOGY REPORT*  Clinical Data: Left wrist pain.  No known injuries.  LEFT WRIST - COMPLETE 3+ VIEW  Comparison: None.  Findings: No evidence of acute or subacute fracture or dislocation. Well-preserved joint spaces.  Well-preserved bone mineral density. No intrinsic  osseous abnormalities.  IMPRESSION: Normal examination.   Original Report Authenticated By: Hulan Saas, M.D.      1. Wrist sprain and strain, right, initial encounter       MDM  Pain and swelling over Left wrist.  Pt unsure of cause of injury.  Swelling is more bothersome than the pain.    Dg Left wrist: no fx   Gave ibuprofen.  Will have pt tx with ice and ibuprofen.  If swelling continues f/u with hand specialist: Dr. Izora Ribas.  Vitals: unremarkable. Discharged in stable condition.    Discussed pt with attending during ED encounter.    I personally performed the services described in this documentation, which was scribed in my presence. The recorded information has been reviewed and is  accurate.   Junius Finner, PA-C 06/22/12 2057

## 2012-10-10 ENCOUNTER — Other Ambulatory Visit: Payer: Self-pay | Admitting: Obstetrics and Gynecology

## 2012-10-10 DIAGNOSIS — N644 Mastodynia: Secondary | ICD-10-CM

## 2012-10-11 ENCOUNTER — Ambulatory Visit (HOSPITAL_COMMUNITY)
Admission: RE | Admit: 2012-10-11 | Discharge: 2012-10-11 | Disposition: A | Discharge status: Home / Self Care | Payer: Self-pay | Source: Ambulatory Visit | Attending: Obstetrics and Gynecology | Admitting: Obstetrics and Gynecology

## 2012-10-11 ENCOUNTER — Encounter (HOSPITAL_COMMUNITY): Payer: Self-pay

## 2012-10-11 ENCOUNTER — Other Ambulatory Visit: Payer: Self-pay | Admitting: Obstetrics and Gynecology

## 2012-10-11 VITALS — BP 136/90 | Temp 98.0°F | Ht 68.0 in | Wt 190.0 lb

## 2012-10-11 DIAGNOSIS — N898 Other specified noninflammatory disorders of vagina: Secondary | ICD-10-CM

## 2012-10-11 DIAGNOSIS — Z01419 Encounter for gynecological examination (general) (routine) without abnormal findings: Secondary | ICD-10-CM

## 2012-10-11 NOTE — Patient Instructions (Signed)
Let patient know [facility] will cover Pap smears every 3 years unless has a history of abnormal Pap smears. Referred patient to the [facility] for diagnostic mammogram. Appointment scheduled for Thursday, October 20, 2012 at 0940. Explained patient importance of follow up for patient cancelled original appointment scheduled in December 2013 and did not reschedule. Patient aware of appointment and will be there. Let patient know will follow up with her within the next couple weeks with results phone. Discussed smoking cessation with patient. [name] verbalized understanding.  Herrera, Connie Maser, RN 1:16 PM

## 2012-10-11 NOTE — Addendum Note (Signed)
Encounter addended by: Lurlean Horns, LPN on: 0/07/5407  2:10 PM<BR>     Documentation filed: Orders

## 2012-10-11 NOTE — Progress Notes (Addendum)
Patient complained of skin lesions under bilateral breasts.   Pap Smear:    Pap smear completed today. Per patient last Pap smear was > 3 years at [facility] and normal. Per patient she has no history of abnormal Pap smears. No Pap smear results in EPIC.  Physical exam: Breasts Open skin lesions under bilateral breasts greater left breast. Patient states areas form a lump then burst. Encouraged patient to keep area of under breast dry and clean. No complaints of itching. Told her to talk with physician at [facility] when she follows up next week.         Pelvic/Bimanual   Ext Genitalia No lesions, no swelling and no discharge observed on external genitalia.         Vagina Vagina pink and normal texture. No lesions and thick white discharge with odor observed in vagina. Wet prep completed.         Cervix Cervix is present. Cervix pink and of normal texture. Thick white discharge observed on cervix.Marland Kitchen     Uterus Uterus is present and palpable. Uterus in normal position and normal size.        Adnexae Bilateral ovaries present and palpable. No tenderness on palpation.          Rectovaginal No rectal exam completed today since patient had no rectal complaints. No skin abnormalities observed on exam.

## 2012-10-11 NOTE — Addendum Note (Signed)
Encounter addended by: Saintclair Halsted, RN on: 10/11/2012  1:26 PM<BR>     Documentation filed: Visit Diagnoses, Notes Section

## 2012-10-12 LAB — WET PREP, GENITAL: Trich, Wet Prep: NONE SEEN

## 2012-10-13 ENCOUNTER — Telehealth (HOSPITAL_COMMUNITY): Payer: Self-pay | Admitting: *Deleted

## 2012-10-13 ENCOUNTER — Other Ambulatory Visit: Payer: Self-pay | Admitting: *Deleted

## 2012-10-13 DIAGNOSIS — B9689 Other specified bacterial agents as the cause of diseases classified elsewhere: Secondary | ICD-10-CM

## 2012-10-13 MED ORDER — METRONIDAZOLE 500 MG PO TABS: 500.0000 mg | ORAL_TABLET | Freq: Two times a day (BID) | ORAL | Status: DC

## 2012-10-13 NOTE — Telephone Encounter (Signed)
Telephoned patient at home # and discussed negative pap smear. Advised patient wet prep did show bacterial vaginosis and medication was called in to pharmacy. Patient voiced understanding.

## 2012-10-20 ENCOUNTER — Other Ambulatory Visit: Payer: Self-pay

## 2012-10-31 ENCOUNTER — Other Ambulatory Visit: Payer: Self-pay

## 2012-11-14 ENCOUNTER — Inpatient Hospital Stay: Admission: RE | Admit: 2012-11-14 | Payer: Self-pay | Source: Ambulatory Visit

## 2012-11-21 ENCOUNTER — Telehealth (HOSPITAL_COMMUNITY): Payer: Self-pay | Admitting: *Deleted

## 2012-11-21 NOTE — Telephone Encounter (Signed)
Telephoned patient at home # and not available 

## 2012-11-25 ENCOUNTER — Telehealth (HOSPITAL_COMMUNITY): Payer: Self-pay | Admitting: *Deleted

## 2012-11-25 NOTE — Telephone Encounter (Signed)
Telephoned patient on mobile # and not available

## 2012-11-28 ENCOUNTER — Encounter (HOSPITAL_COMMUNITY): Payer: Self-pay | Admitting: *Deleted

## 2013-01-04 ENCOUNTER — Ambulatory Visit
Admission: RE | Admit: 2013-01-04 | Discharge: 2013-01-04 | Disposition: A | Discharge status: Home / Self Care | Payer: Medicaid Other | Source: Ambulatory Visit | Attending: Obstetrics and Gynecology | Admitting: Obstetrics and Gynecology

## 2013-01-04 DIAGNOSIS — N644 Mastodynia: Secondary | ICD-10-CM

## 2013-04-01 ENCOUNTER — Encounter (HOSPITAL_COMMUNITY): Payer: Self-pay | Admitting: *Deleted

## 2013-04-01 ENCOUNTER — Inpatient Hospital Stay (HOSPITAL_COMMUNITY)
Admission: AD | Admit: 2013-04-01 | Discharge: 2013-04-02 | Disposition: A | Discharge status: Home / Self Care | Payer: Medicaid Other | Source: Ambulatory Visit | Attending: Obstetrics & Gynecology | Admitting: Obstetrics & Gynecology

## 2013-04-01 DIAGNOSIS — N611 Abscess of the breast and nipple: Secondary | ICD-10-CM

## 2013-04-01 DIAGNOSIS — N61 Mastitis without abscess: Secondary | ICD-10-CM | POA: Insufficient documentation

## 2013-04-01 HISTORY — DX: Disorder of brain, unspecified: G93.9

## 2013-04-01 HISTORY — DX: Essential (primary) hypertension: I10

## 2013-04-01 LAB — CBC
MCH: 30.6 pg (ref 26.0–34.0)
MCHC: 35 g/dL (ref 30.0–36.0)
MCV: 87.4 fL (ref 78.0–100.0)
Platelets: 277 10*3/uL (ref 150–400)
RDW: 12.3 % (ref 11.5–15.5)

## 2013-04-01 MED ORDER — SULFAMETHOXAZOLE-TMP DS 800-160 MG PO TABS: 1.0000 | ORAL_TABLET | Freq: Two times a day (BID) | ORAL | Status: DC

## 2013-04-01 MED ORDER — SULFAMETHOXAZOLE-TMP DS 800-160 MG PO TABS
2.0000 | ORAL_TABLET | Freq: Two times a day (BID) | ORAL | Status: DC
  Administered 2013-04-01: 2 via ORAL
  Filled 2013-04-01: qty 2

## 2013-04-01 MED ORDER — IBUPROFEN 600 MG PO TABS
600.0000 mg | ORAL_TABLET | Freq: Once | ORAL | Status: AC
  Administered 2013-04-01: 600 mg via ORAL
  Filled 2013-04-01: qty 1

## 2013-04-01 NOTE — MAU Note (Addendum)
Under L breast is open, draining area on under side of breast. Pt states this happened 3 months ago, the area opened and went away and now has returned. L breast reddened around draining area. Drainage is bloody and very small amt

## 2013-04-01 NOTE — MAU Note (Signed)
Pt states has had recurring breast lump that drains under left breast. Is swollen and painful to touch.

## 2013-04-01 NOTE — MAU Provider Note (Signed)
History     CSN: [id number]  Arrival date and time: 04/01/13 4098   First Provider Initiated Contact with Patient 04/01/13 1941      Chief Complaint  Patient presents with  . Breast Mass   HPI  [name] is a 37 y.o. female [phone] who presents with complaints of a "left boil under my breast". She first noticed it a couple of days ago; she has experienced this in the past. She has never been seen for the boils she has had in the past. She denies fevers. Currently experiencing severe pain in the left breast from the boil; rates it 9/10 and that is why she came to be seen. The boil is opening and is draining some.   OB History   Grav Para Term Preterm Abortions TAB SAB Ect Mult Living   4 4 4       4       Past Medical History  Diagnosis Date  . Brain lesion     Past Surgical History  Procedure Laterality Date  . Tubal ligation    . Tubal ligation      Family History  Problem Relation Age of Onset  . Cancer Maternal Grandmother 50    breast    History  Substance Use Topics  . Smoking status: Current Every Day Smoker -- 1.00 packs/day for 4 years    Types: Cigarettes  . Smokeless tobacco: Never Used  . Alcohol Use: No    Allergies:  Allergies  Allergen Reactions  . Hydrocodone Nausea And Vomiting    Patient states that she can take medication it just makes her have bad nausea    Prescriptions prior to admission  Medication Sig Dispense Refill  . ibuprofen (ADVIL,MOTRIN) 200 MG tablet Take 800 mg by mouth every 6 (six) hours as needed for pain.        Results for orders placed during the hospital encounter of 04/01/13 (from the past 24 hour(s))  POCT PREGNANCY, URINE     Status: None   Collection Time    04/01/13  7:22 PM      Result Value Range   Preg Test, Ur NEGATIVE  NEGATIVE    Review of Systems  Constitutional: Negative for fever and chills.  Gastrointestinal: Negative for nausea, vomiting and abdominal pain.  Genitourinary:  Negative for dysuria, urgency, frequency and hematuria.       No vaginal discharge. No vaginal bleeding. No dysuria.   Skin:       Left breast boil; currently draining.    Physical Exam   Blood pressure 140/92, pulse 98, temperature 98.2 F (36.8 C), temperature source Oral, resp. rate 20, height 5' 7.5" (1.715 m), weight 88.962 kg (196 lb 2 oz), last menstrual period 02/13/2013.  Physical Exam  Constitutional: She is oriented to person, place, and time. She appears well-developed and well-nourished. No distress.  HENT:  Head: Normocephalic.  Eyes: Pupils are equal, round, and reactive to light.  Neck: Neck supple.  Respiratory: Effort normal. Left breast exhibits skin change.    GI: Soft.  Musculoskeletal: Normal range of motion.  Neurological: She is alert and oriented to person, place, and time.  Skin: Skin is warm. She is not diaphoretic.  Psychiatric: Her behavior is normal.    MAU Course  Procedures None  MDM UPT Ibuprofen 600 mg PO   Assessment and Plan   Report given to [place] CNM who resumes care of the patient   [name],  NP 04/01/2013, 7:41 PM   Dorathy Kinsman, CNM assumed care of pt at 2000.  No relief of pain w/ Ibuprofen. 6 cm very tender, non-fluctuant mass w/ surrounding cellulitis.   Consulted w/ Dr. Penne Lash who recommends eval by general surgeon in ED and Bactrim DS 2 tabs before transfer. Offered [facility]. Pt declined. Prefers to go by private vehicle. Pt stable for transfer by private vehicle.   ASSESSMENT: 1. Nonpuerperal abscess of breast   2. Cellulitis of female breast     PLAN: Transfer to [facility] ED.  [place], [place] 04/01/2013 8:31 PM

## 2013-04-01 NOTE — Progress Notes (Signed)
Pt is driving herself to [facility] ED for further eval of L breast abscess

## 2013-04-01 NOTE — Progress Notes (Signed)
Pt signed Transfer form and will drive self to [facility] ED for further EVAL

## 2013-04-02 ENCOUNTER — Encounter (HOSPITAL_COMMUNITY): Payer: Self-pay | Admitting: Emergency Medicine

## 2013-04-02 ENCOUNTER — Emergency Department (HOSPITAL_COMMUNITY)
Admission: EM | Admit: 2013-04-02 | Discharge: 2013-04-02 | Disposition: A | Discharge status: Home / Self Care | Payer: Medicaid Other | Attending: Emergency Medicine | Admitting: Emergency Medicine

## 2013-04-02 DIAGNOSIS — Z885 Allergy status to narcotic agent status: Secondary | ICD-10-CM | POA: Insufficient documentation

## 2013-04-02 DIAGNOSIS — N611 Abscess of the breast and nipple: Secondary | ICD-10-CM

## 2013-04-02 DIAGNOSIS — N61 Mastitis without abscess: Secondary | ICD-10-CM | POA: Insufficient documentation

## 2013-04-02 DIAGNOSIS — F17200 Nicotine dependence, unspecified, uncomplicated: Secondary | ICD-10-CM | POA: Insufficient documentation

## 2013-04-02 DIAGNOSIS — I10 Essential (primary) hypertension: Secondary | ICD-10-CM | POA: Insufficient documentation

## 2013-04-02 MED ORDER — OXYCODONE-ACETAMINOPHEN 5-325 MG PO TABS: 1.0000 | ORAL_TABLET | ORAL | Status: DC | PRN

## 2013-04-02 MED ORDER — OXYCODONE-ACETAMINOPHEN 5-325 MG PO TABS
2.0000 | ORAL_TABLET | Freq: Once | ORAL | Status: AC
  Administered 2013-04-02: 2 via ORAL
  Filled 2013-04-02: qty 2

## 2013-04-02 MED ORDER — ONDANSETRON HCL 4 MG/2ML IJ SOLN
4.0000 mg | Freq: Once | INTRAMUSCULAR | Status: AC
  Administered 2013-04-02: 4 mg via INTRAVENOUS
  Filled 2013-04-02: qty 2

## 2013-04-02 NOTE — ED Provider Notes (Signed)
CSN: [id number]     Arrival date & time 04/02/13  1026 History   None    Chief Complaint  Patient presents with  . Abscess   (Consider location/radiation/quality/duration/timing/severity/associated sxs/prior Treatment) HPI  This is a 37 year old female who presents the emergency department chief complaint of left breast abscess.  [facility].  She was seen by Dr. Grant Ruts who recommended the patient be transferred to: For evaluation by surgical team.  Refused transfer by [facility] and took her POV. The patient left to go to work instead. Today she comes to [facility] with the same complain. SHe has a history of recurring left breast abscess. The current abscess has been present for 3 months. It is draining bloody and purulent fluid. She c/o sever pain. Patient was started on bactrim last night. Denies fevers, chills, myalgias, arthralgias. Denies abdominal pain, nausea, vomiting, diarrhea or constipation.    Past Medical History  Diagnosis Date  . Brain lesion   . Hypertension    Past Surgical History  Procedure Laterality Date  . Tubal ligation    . Tubal ligation     Family History  Problem Relation Age of Onset  . Cancer Maternal Grandmother 68    breast   History  Substance Use Topics  . Smoking status: Current Every Day Smoker -- 1.00 packs/day for 4 years    Types: Cigarettes  . Smokeless tobacco: Never Used  . Alcohol Use: No   OB History   Grav Para Term Preterm Abortions TAB SAB Ect Mult Living   4 4 4       4      Review of Systems  Constitutional: Negative for fever and chills.  Gastrointestinal: Negative for nausea, vomiting and abdominal pain.  Musculoskeletal: Negative for myalgias.  Skin: Positive for wound.  Neurological: Negative for weakness.  All other systems reviewed and are negative.    Allergies  Hydrocodone  Home Medications   Current Outpatient Rx    Name  Route  Sig  Dispense  Refill  . ibuprofen (ADVIL,MOTRIN) 200 MG tablet   Oral   Take 800 mg by mouth every 6 (six) hours as needed for pain.          Marland Kitchen sulfamethoxazole-trimethoprim (BACTRIM DS) 800-160 MG per tablet   Oral   Take 1 tablet by mouth 2 (two) times daily.   20 tablet   0    BP 125/81  Pulse 79  Temp(Src) 99.1 F (37.3 C) (Oral)  Resp 16  Wt 196 lb (88.905 kg)  SpO2 100%  LMP 02/13/2013 Physical Exam  Constitutional: She is oriented to person, place, and time. She appears well-developed and well-nourished. No distress.  HENT:  Head: Normocephalic and atraumatic.  Eyes: Conjunctivae are normal. No scleral icterus.  Neck: Normal range of motion.  Cardiovascular: Normal rate, regular rhythm and normal heart sounds.  Exam reveals no gallop and no friction rub.   No murmur heard. Pulmonary/Chest: Effort normal and breath sounds normal. No respiratory distress.    Abdominal: Soft. Bowel sounds are normal. She exhibits no distension and no mass. There is no tenderness. There is no guarding.  Neurological: She is alert and oriented to person, place, and time.  Skin: Skin is warm and dry. She is not diaphoretic.    ED Course  Procedures (including critical care time) Labs Review Labs Reviewed - No data to display Imaging Review No results found.  EKG Interpretation   None       MDM   1. Breast abscess of female    BP 125/81  Pulse 79  Temp(Src) 99.1 F (37.3 C) (Oral)  Resp 16  Wt 196 lb (88.905 kg)  SpO2 100%  LMP 02/13/2013 patietn with large beast abscess. I have placed consult to Surgery for eval here in the ED.  Pain medication given.    12:38 PM Filed Vitals:   04/02/13 1130  BP: 136/76  Pulse: 77  Temp:   Resp:    Patient resting comfortably after administration of Percocet.  She states her pain is reduced greatly.  I spoke with Dr. Janee Morn on call for surgery today he states that deep to the length of the abscess present,  along with the fact she is afebrile without white count he does not feel she needs to be seen emergently.  Patient is instructed to follow up with [facility] surgery urgent clinic as soon as possible.  She is also instructed to continue taking her Bactrim.  Patient will be discharged with pain medications.  Return precautions at discharge.    Arthor Captain, PA-C 04/02/13 1239

## 2013-04-02 NOTE — ED Notes (Addendum)
[facility] discharge, reports that she was to be transferred to [facility] yesterday to be seen by a surgeon, pt was to driver herself pov. Pt did not come last night because she needed to work.

## 2013-04-05 NOTE — MAU Provider Note (Signed)
Pt seen and examined.  Since it is a weekend it would be best if she had evaluation today by general surgeon.  Pt to be seen at [facility] ED.  Attestation of Attending Supervision of Advanced Practitioner (CNM/NP): Evaluation and management procedures were performed by the Advanced Practitioner under my supervision and collaboration. I have reviewed the Advanced Practitioner's note and chart, and I agree with the management and plan.  LEGGETT,KELLY H. 9:32 AM

## 2013-04-05 NOTE — ED Provider Notes (Signed)
Medical screening examination/treatment/procedure(s) were performed by non-physician practitioner and as supervising physician I was immediately available for consultation/collaboration.  EKG Interpretation   None         Laray Anger, DO 04/05/13 1322

## 2013-06-05 ENCOUNTER — Other Ambulatory Visit: Payer: Self-pay | Admitting: Obstetrics and Gynecology

## 2013-06-30 ENCOUNTER — Inpatient Hospital Stay (HOSPITAL_COMMUNITY)
Admission: AD | Admit: 2013-06-30 | Discharge: 2013-06-30 | Disposition: A | Discharge status: Home / Self Care | Payer: Medicaid Other | Source: Ambulatory Visit | Attending: Obstetrics & Gynecology | Admitting: Obstetrics & Gynecology

## 2013-06-30 ENCOUNTER — Encounter (HOSPITAL_COMMUNITY): Payer: Self-pay

## 2013-06-30 DIAGNOSIS — A6000 Herpesviral infection of urogenital system, unspecified: Secondary | ICD-10-CM

## 2013-06-30 DIAGNOSIS — B009 Herpesviral infection, unspecified: Secondary | ICD-10-CM | POA: Insufficient documentation

## 2013-06-30 DIAGNOSIS — N949 Unspecified condition associated with female genital organs and menstrual cycle: Secondary | ICD-10-CM | POA: Insufficient documentation

## 2013-06-30 DIAGNOSIS — L293 Anogenital pruritus, unspecified: Secondary | ICD-10-CM | POA: Insufficient documentation

## 2013-06-30 DIAGNOSIS — R109 Unspecified abdominal pain: Secondary | ICD-10-CM | POA: Insufficient documentation

## 2013-06-30 DIAGNOSIS — F17200 Nicotine dependence, unspecified, uncomplicated: Secondary | ICD-10-CM | POA: Insufficient documentation

## 2013-06-30 DIAGNOSIS — A5901 Trichomonal vulvovaginitis: Secondary | ICD-10-CM | POA: Insufficient documentation

## 2013-06-30 LAB — URINALYSIS, ROUTINE W REFLEX MICROSCOPIC
Bilirubin Urine: NEGATIVE
GLUCOSE, UA: NEGATIVE mg/dL
HGB URINE DIPSTICK: NEGATIVE
Ketones, ur: NEGATIVE mg/dL
Nitrite: NEGATIVE
PH: 5.5 (ref 5.0–8.0)
PROTEIN: NEGATIVE mg/dL
Urobilinogen, UA: 1 mg/dL (ref 0.0–1.0)

## 2013-06-30 LAB — URINE MICROSCOPIC-ADD ON

## 2013-06-30 LAB — WET PREP, GENITAL: YEAST WET PREP: NONE SEEN

## 2013-06-30 LAB — GC/CHLAMYDIA PROBE AMP
CT PROBE, AMP APTIMA: NEGATIVE
GC PROBE AMP APTIMA: NEGATIVE

## 2013-06-30 LAB — POCT PREGNANCY, URINE: PREG TEST UR: NEGATIVE

## 2013-06-30 MED ORDER — VALACYCLOVIR HCL 500 MG PO TABS
1000.0000 mg | ORAL_TABLET | Freq: Once | ORAL | Status: AC
  Administered 2013-06-30: 1000 mg via ORAL
  Filled 2013-06-30: qty 2

## 2013-06-30 MED ORDER — ONDANSETRON HCL 4 MG PO TABS
4.0000 mg | ORAL_TABLET | Freq: Once | ORAL | Status: AC
  Administered 2013-06-30: 4 mg via ORAL
  Filled 2013-06-30: qty 1

## 2013-06-30 MED ORDER — ACYCLOVIR 200 MG PO CAPS: 200.0000 mg | ORAL_CAPSULE | ORAL | Status: AC

## 2013-06-30 MED ORDER — IBUPROFEN 600 MG PO TABS
600.0000 mg | ORAL_TABLET | Freq: Once | ORAL | Status: AC
  Administered 2013-06-30: 600 mg via ORAL
  Filled 2013-06-30: qty 1

## 2013-06-30 MED ORDER — METRONIDAZOLE 500 MG PO TABS
2000.0000 mg | ORAL_TABLET | Freq: Once | ORAL | Status: AC
  Administered 2013-06-30: 2000 mg via ORAL
  Filled 2013-06-30: qty 4

## 2013-06-30 NOTE — MAU Provider Note (Signed)
Chief Complaint: Vaginal Discharge, Vaginal Itching, Vaginal Pain and Abdominal Pain  First Provider Initiated Contact with Patient 06/30/13 0416     SUBJECTIVE HPI: Connie Herrera is a 38 y.o. [phone] female who presents with vaginal discharge and irritation x 1 week. No relief w./ Monistat. Also feels likes she about to get an HSV outbreak. Have pain and burning on right labia majora similar to prodrome pf previous outbreaks. Ran out of medication. Also reported some dysuria earlier this week that resolved. Denies fever, chills, vaginal bleeding, urgency, frequency, flank pain. Not currently sexually active.  Past Medical History  Diagnosis Date  . Brain lesion   . Hypertension    OB History  Gravida Para Term Preterm AB SAB TAB Ectopic Multiple Living  4 4 4       4     # Outcome Date GA Lbr Len/2nd Weight Sex Delivery Anes PTL Lv  4 TRM      SVD   Y  3 TRM      SVD   Y  2 TRM      SVD   Y  1 TRM      SVD   Y     Past Surgical History  Procedure Laterality Date  . Tubal ligation    . Tubal ligation     History   Social History  . Marital Status: Single    Spouse Name: N/A    Number of Children: N/A  . Years of Education: N/A   Occupational History  . Not on file.   Social History Main Topics  . Smoking status: Current Every Day Smoker -- 1.00 packs/day for 4 years    Types: Cigarettes  . Smokeless tobacco: Never Used  . Alcohol Use: No  . Drug Use: No  . Sexual Activity: Not Currently    Birth Control/ Protection: Surgical   Other Topics Concern  . Not on file   Social History Narrative  . No narrative on file   No current facility-administered medications on file prior to encounter.   Current Outpatient Prescriptions on File Prior to Encounter  Medication Sig Dispense Refill  . ibuprofen (ADVIL,MOTRIN) 200 MG tablet Take 800 mg by mouth every 6 (six) hours as needed for pain.        Allergies  Allergen Reactions  . Hydrocodone Nausea And Vomiting     Patient states that she can take medication it just makes her have bad nausea    ROS: Pertinent items in HPI  OBJECTIVE Blood pressure 144/86, pulse 89, temperature 98.4 F (36.9 C), temperature source Oral, resp. rate 18, height 5\' 7"  (1.702 m), weight 87.907 kg (193 lb 12.8 oz), last menstrual period 06/08/2013. GENERAL: Well-developed, well-nourished female in no acute distress.  HEENT: Normocephalic HEART: normal rate RESP: normal effort ABDOMEN: Soft, non-tender EXTREMITIES: Nontender, no edema NEURO: Alert and oriented SPECULUM EXAM: NEFG, mildly malodorous, thin, white discharge, no blood noted, cervix clean. No lesions or erythema visible on labia, but right labia majora extremely sensitive. Patient states this is where she usually has HSV outbreaks. BIMANUAL: cervix closed; uterus normal size, no adnexal tenderness or masses no cervical motion tenderness.  LAB RESULTS Results for orders placed during the hospital encounter of 06/30/13 (from the past 24 hour(s))  URINALYSIS, ROUTINE W REFLEX MICROSCOPIC     Status: Abnormal   Collection Time    06/30/13  3:10 AM      Result Value Ref Range   Color, Urine YELLOW  YELLOW   APPearance CLOUDY (*) CLEAR   Specific Gravity, Urine >1.030 (*) 1.005 - 1.030   pH 5.5  5.0 - 8.0   Glucose, UA NEGATIVE  NEGATIVE mg/dL   Hgb urine dipstick NEGATIVE  NEGATIVE   Bilirubin Urine NEGATIVE  NEGATIVE   Ketones, ur NEGATIVE  NEGATIVE mg/dL   Protein, ur NEGATIVE  NEGATIVE mg/dL   Urobilinogen, UA 1.0  0.0 - 1.0 mg/dL   Nitrite NEGATIVE  NEGATIVE   Leukocytes, UA MODERATE (*) NEGATIVE  URINE MICROSCOPIC-ADD ON     Status: Abnormal   Collection Time    06/30/13  3:10 AM      Result Value Ref Range   Squamous Epithelial / LPF MANY (*) RARE   WBC, UA 21-50  <3 WBC/hpf   RBC / HPF 0-2  <3 RBC/hpf   Bacteria, UA RARE  RARE   Urine-Other MUCOUS PRESENT    POCT PREGNANCY, URINE     Status: None   Collection Time    06/30/13  3:11 AM       Result Value Ref Range   Preg Test, Ur NEGATIVE  NEGATIVE  WET PREP, GENITAL     Status: Abnormal   Collection Time    06/30/13  4:20 AM      Result Value Ref Range   Yeast Wet Prep HPF POC NONE SEEN  NONE SEEN   Trich, Wet Prep MODERATE (*) NONE SEEN   Clue Cells Wet Prep HPF POC FEW (*) NONE SEEN   WBC, Wet Prep HPF POC FEW (*) NONE SEEN    IMAGING No results found.  MAU COURSE Flagyl given for Trichomonas. Valtrex given for herpes prodrome.  Patient requesting pain medication for cramping. Ibuprofen given. ASSESSMENT 1. Recurrent genital HSV (herpes simplex virus) infection   2. Trichomonal vaginitis     PLAN Discharge home in stable condition. Urine culture, GC/Chlamydia cultures pending. Partner needs treatment for Trichomonas. No intercourse until one week after both of untreated. Always use condoms. Recommend complete STD testing at gynecologist or department.     Follow-up Information   Follow up with [facility]. (As needed)    Contact information:   [address] [address] [phone]       Follow up with [facility]. (As needed in emergencies)    Contact information:   [address] [id number] [place] [address] [phone]       Medication List    STOP taking these medications       miconazole 100 MG vaginal suppository  Commonly known as:  MICOTIN     oxyCODONE-acetaminophen 5-325 MG per tablet  Commonly known as:  PERCOCET      TAKE these medications       acyclovir 200 MG capsule  Commonly known as:  ZOVIRAX  Take 1 capsule (200 mg total) by mouth as directed. 4 tablets twice a day x5 days for outbreaks. 2 tablets twice a day for suppression.     ibuprofen 200 MG tablet  Commonly known as:  ADVIL,MOTRIN  Take 800 mg by mouth every 6 (six) hours as needed for pain.       [place], CNM 06/30/2013  5:30 AM

## 2013-06-30 NOTE — Discharge Instructions (Signed)
Genital Herpes Genital herpes is a sexually transmitted disease. This means that it is a disease passed by having sex with an infected person. There is no cure for genital herpes. The time between attacks can be months to years. The virus may live in a person but produce no problems (symptoms). This infection can be passed to a baby as it travels down the birth canal (vagina). In a newborn, this can cause central nervous system damage, eye damage, or even death. The virus that causes genital herpes is usually HSV-2 virus. The virus that causes oral herpes is usually HSV-1. The diagnosis (learning what is wrong) is made through culture results. SYMPTOMS  Usually symptoms of pain and itching begin a few days to a week after contact. It first appears as small blisters that progress to small painful ulcers which then scab over and heal after several days. It affects the outer genitalia, birth canal, cervix, penis, anal area, buttocks, and thighs. HOME CARE INSTRUCTIONS   Keep ulcerated areas dry and clean.  Take medications as directed. Antiviral medications can speed up healing. They will not prevent recurrences or cure this infection. These medications can also be taken for suppression if there are frequent recurrences.  While the infection is active, it is contagious. Avoid all sexual contact during active infections.  Condoms may help prevent spread of the herpes virus.  Practice safe sex.  Wash your hands thoroughly after touching the genital area.  Avoid touching your eyes after touching your genital area.  Inform your caregiver if you have had genital herpes and become pregnant. It is your responsibility to insure a safe outcome for your baby in this pregnancy.  Only take over-the-counter or prescription medicines for pain, discomfort, or fever as directed by your caregiver. SEEK MEDICAL CARE IF:   You have a recurrence of this infection.  You do not respond to medications and are not  improving.  You have new sources of pain or discharge which have changed from the original infection.  You have an oral temperature above 102 F (38.9 C).  You develop abdominal pain.  You develop eye pain or signs of eye infection. Document Released: 03/20/2000 Document Revised: 06/15/2011 Document Reviewed: 04/10/2009 [facility] Patient Information 2014 [place], [place].  Trichomoniasis Trichomoniasis is an infection, caused by the Trichomonas organism, that affects both women and men. In women, the outer female genitalia and the vagina are affected. In men, the penis is mainly affected, but the prostate and other reproductive organs can also be involved. Trichomoniasis is a sexually transmitted disease (STD) and is most often passed to another person through sexual contact. The majority of people who get trichomoniasis do so from a sexual encounter and are also at risk for other STDs. CAUSES   Sexual intercourse with an infected partner.  It can be present in swimming pools or hot tubs. SYMPTOMS   Abnormal gray-green frothy vaginal discharge in women.  Vaginal itching and irritation in women.  Itching and irritation of the area outside the vagina in women.  Penile discharge with or without pain in males.  Inflammation of the urethra (urethritis), causing painful urination.  Bleeding after sexual intercourse. RELATED COMPLICATIONS  Pelvic inflammatory disease.  Infection of the uterus (endometritis).  Infertility.  Tubal (ectopic) pregnancy.  It can be associated with other STDs, including gonorrhea and chlamydia, hepatitis B, and HIV. COMPLICATIONS DURING PREGNANCY  Early (premature) delivery.  Premature rupture of the membranes (PROM).  Low birth weight. DIAGNOSIS   Visualization of  Trichomonas under the microscope from the vagina discharge.  Ph of the vagina greater than 4.5, tested with a test tape.  Trich Rapid Test.  Culture of the organism, but this is  not usually needed.  It may be found on a Pap test.  Having a "strawberry cervix,"which means the cervix looks very red like a strawberry. TREATMENT   You may be given medication to fight the infection. Inform your caregiver if you could be or are pregnant. Some medications used to treat the infection should not be taken during pregnancy.  Over-the-counter medications or creams to decrease itching or irritation may be recommended.  Your sexual partner will need to be treated if infected. HOME CARE INSTRUCTIONS   Take all medication prescribed by your caregiver.  Take over-the-counter medication for itching or irritation as directed by your caregiver.  Do not have sexual intercourse while you have the infection.  Do not douche or wear tampons.  Discuss your infection with your partner, as your partner may have acquired the infection from you. Or, your partner may have been the person who transmitted the infection to you.  Have your sex partner examined and treated if necessary.  Practice safe, informed, and protected sex.  See your caregiver for other STD testing. SEEK MEDICAL CARE IF:   You still have symptoms after you finish the medication.  You have an oral temperature above 102 F (38.9 C).  You develop belly (abdominal) pain.  You have pain when you urinate.  You have bleeding after sexual intercourse.  You develop a rash.  The medication makes you sick or makes you throw up (vomit). Document Released: 09/16/2000 Document Revised: 06/15/2011 Document Reviewed: 10/12/2008 [facility] Patient Information 2014 [place], [place].

## 2013-06-30 NOTE — MAU Note (Addendum)
Thick, white, malodorous vaginal discharge x 1 week. Took monistat 7 with no relief. Vaginal itching & irritation worsening. Known HSV, currently having an outbreak; doesn't have med for HSV. Lower abdominal pain; painful urination earlier this week that improved after drinking cranberry juice.

## 2013-07-01 LAB — URINE CULTURE
Colony Count: 75000
Special Requests: NORMAL

## 2013-07-14 ENCOUNTER — Other Ambulatory Visit: Payer: Self-pay | Admitting: Obstetrics and Gynecology

## 2013-07-14 DIAGNOSIS — N644 Mastodynia: Secondary | ICD-10-CM

## 2013-07-18 ENCOUNTER — Other Ambulatory Visit: Payer: Self-pay | Admitting: Internal Medicine

## 2013-07-18 ENCOUNTER — Other Ambulatory Visit: Payer: Self-pay

## 2013-07-18 ENCOUNTER — Other Ambulatory Visit: Payer: Self-pay | Admitting: Obstetrics and Gynecology

## 2013-07-18 DIAGNOSIS — N644 Mastodynia: Secondary | ICD-10-CM

## 2013-07-18 NOTE — MAU Provider Note (Signed)
Attestation of Attending Supervision of Advanced Practitioner (CNM/NP): Evaluation and management procedures were performed by the Advanced Practitioner under my supervision and collaboration. I have reviewed the Advanced Practitioner's note and chart, and I agree with the management and plan.  Guss Bunde 7:15 PM

## 2013-07-19 ENCOUNTER — Inpatient Hospital Stay: Admission: RE | Admit: 2013-07-19 | Payer: Medicaid Other | Source: Ambulatory Visit

## 2013-08-03 ENCOUNTER — Encounter: Payer: Self-pay | Admitting: *Deleted

## 2013-08-04 ENCOUNTER — Encounter: Payer: Self-pay | Admitting: Neurology

## 2013-08-04 ENCOUNTER — Ambulatory Visit (INDEPENDENT_AMBULATORY_CARE_PROVIDER_SITE_OTHER): Payer: Medicaid Other | Admitting: Neurology

## 2013-08-04 VITALS — BP 140/88 | HR 90 | Ht 67.0 in | Wt 190.0 lb

## 2013-08-04 DIAGNOSIS — R253 Fasciculation: Secondary | ICD-10-CM

## 2013-08-04 DIAGNOSIS — R209 Unspecified disturbances of skin sensation: Secondary | ICD-10-CM

## 2013-08-04 DIAGNOSIS — R5383 Other fatigue: Secondary | ICD-10-CM

## 2013-08-04 DIAGNOSIS — R202 Paresthesia of skin: Secondary | ICD-10-CM

## 2013-08-04 DIAGNOSIS — R5381 Other malaise: Secondary | ICD-10-CM

## 2013-08-04 DIAGNOSIS — R531 Weakness: Secondary | ICD-10-CM

## 2013-08-04 DIAGNOSIS — R259 Unspecified abnormal involuntary movements: Secondary | ICD-10-CM

## 2013-08-04 NOTE — Patient Instructions (Signed)
Overall you are doing fairly well but I do want to suggest a few things today:   As far as diagnostic testing:  1)Please have some blood work checked today 2)Please schedule an EMG/nerve conduction study  Follow up once the above testing is completed. Please call us with any interim questions, concerns, problems, updates or refill requests.   My clinical assistant and will answer any of your questions and relay your messages to me and also relay most of my messages to you.   Our phone number is [phone]. We also have an after hours call service for urgent matters and there is a physician on-call for urgent questions. For any emergencies you know to call 911 or go to the nearest emergency room

## 2013-08-04 NOTE — Progress Notes (Signed)
[facility]    Provider:  Dr Janann Colonel Referring Provider: Philis Fendt, MD Primary Care Physician:  No PCP Per Patient  CC:  LUE weakness  HPI:  Connie Herrera is a 38 y.o. female here as a referral from Dr. Jeanie Cooks for left sided weakness  She notes symptoms started around 2 years ago. Initial symptom was weakness on the left side of her body. Went to [facility], had MRI brain and cervical spine showing white matter lesion on left side of brain concerning for MS, had LP done which was negative for OCB and patient told she does not have MS. Had repeat MRI done at [facility] done in ED which showed continued white matter lesions and she was told this was concerning for MS. Returned to [facility], had repeat MRI which was stable, told it was stable and they still feel she does not have MS. They told her that they think the lesion on her brain is likely related to HTN and instructed her to follow up with PCP.  She describes continued weakness on the left side, feels heavy to her, notes tenderness with touch. Has heaviness in her left leg, hurts if there is any pressure on it. She notes some difficulty with her balance, denies any falls but she feels unsteady and will fall. In the past has had episodes of shaky/jumpy muscles, typically involves the left side but can also involve the right side. She notes some twitching/fasciculation type movements of her muscles.   Otherwise healthy.   [place] Notes 03/2013: Connie Herrera is a 38 y.o. female referred to establish care for management of white matter lesions seen on MRI 2013.  41yoF w/ h/o HTN who presented to establish care for management of white matter lesions seen on MRI. Patient underwent LP 02/2012, which was negative for MSO. Pt states she had LUE weakness. Pt subsequently went to [facility] ED, and underwent MRI for further w/u. Patient was found to have multiple white matter lesions, which were concerning for MS,  and was subsequently referred to [facility] for further management. Pt underwent LP 1 year ago for further work up, but states she did not f/u 2/2 "fear of results" and lack of insurance.   Today, patient continues to have LUE "heaviness," and admits to having "muscle jumping." Pt states several years ago, and states the movements are faint (sounds almost like fasciculations; everywhere?). Patient denies any issues to BLE, and RUE. Patient admits to tingling sensation in finger/toes (intermittent). Patient states all sx are intermittent. Patient denies any positional, aggravating, relieving factors. Pt feels stress provokes these episodes. Patient denies any GI/GU issues, sleep changes, appetite changes, nausea, vomiting, fever. Patient states her balance is "off." She states her balance is off after standing for long period of time (for many years), or when she walks for long period of times. Patient denies any orthostatic issues.   Review of Systems: Out of a complete 14 system review, the patient complains of only the following symptoms, and all other reviewed systems are negative. + weakness, not enough sleep, joint pain, cramps, aching muscles  History   Social History  . Marital Status: Single    Spouse Name: N/A    Number of Children: 4  . Years of Education: 14   Occupational History  .  [employer]   Social History Main Topics  . Smoking status: Current Every Day Smoker -- 1.00 packs/day for 4 years    Types:  Cigarettes  . Smokeless tobacco: Never Used  . Alcohol Use: No  . Drug Use: No  . Sexual Activity: Not Currently    Birth Control/ Protection: Surgical   Other Topics Concern  . Not on file   Social History Narrative   Patient lives at home with children.    Patient has 4 children.    Patient is single.    Patient has 2 years of college.    Patient is right handed.    Patient is working full time. [employer].     Family History  Problem  Relation Age of Onset  . Cancer Maternal Grandmother 49    breast    Past Medical History  Diagnosis Date  . Brain lesion   . Hypertension     Past Surgical History  Procedure Laterality Date  . Tubal ligation    . Tubal ligation      Current Outpatient Prescriptions  Medication Sig Dispense Refill  . ibuprofen (ADVIL,MOTRIN) 200 MG tablet Take 800 mg by mouth every 6 (six) hours as needed for pain.       Marland Kitchen lisinopril (ZESTRIL) 2.5 MG tablet Take 2.5 mg by mouth.       No current facility-administered medications for this visit.    Allergies as of 08/04/2013 - Review Complete 08/04/2013  Allergen Reaction Noted  . Hydrocodone Nausea And Vomiting 01/09/2012    Vitals: BP 140/88  Pulse 90  Ht 5\' 7"  (1.702 m)  Wt 190 lb (86.183 kg)  BMI 29.75 kg/m2 Last Weight:  Wt Readings from Last 1 Encounters:  08/04/13 190 lb (86.183 kg)   Last Height:   Ht Readings from Last 1 Encounters:  08/04/13 5\' 7"  (1.702 m)     Physical exam: Exam: Gen: NAD, conversant Eyes: anicteric sclerae, moist conjunctivae HENT: Atraumatic, oropharynx clear Neck: Trachea midline; supple,  Lungs: CTA, no wheezing, rales, rhonic                          CV: RRR, no MRG Abdomen: Soft, non-tender;  Extremities: No peripheral edema  Skin: Normal temperature, no rash,  Psych: Appropriate affect, pleasant  Neuro: MS: AA&Ox3, appropriately interactive, normal affect   Speech: fluent w/o paraphasic error  Memory: good recent and remote recall  CN: PERRL, EOMI no nystagmus, no ptosis, sensation intact to LT V1-V3 bilat, face symmetric, no weakness, hearing grossly intact, palate elevates symmetrically, shoulder shrug 5/5 bilat,  tongue protrudes midline, no fasiculations noted.  Motor: normal bulk and tone Strength: 5/5  In all extremities with giveaway weakness in the LUE and LLE No fasciculations noted, marked tenderness to palpation of muscles in LUE  Coord: rapid alternating and  point-to-point (FNF, HTS) movements intact.  Reflexes: brisk L patellar, bilat downgoing toes  Sens: LT intact in all extremities  Gait: posture, stance, stride and arm-swing normal. Tandem gait intact. Able to walk on heels and toes. Romberg absent.   Assessment:  After physical and neurologic examination, review of laboratory studies, imaging, neurophysiology testing and pre-existing records, assessment will be reviewed on the problem list.  Plan:  Treatment plan and additional workup will be reviewed under Problem List.  1)Weakness 2)Paresthesias 3)Fasciculations  37y/o woman presenting for initial evaluation of L sided weakness, paresthesias, pain and fasciculations. Unclear etiology of symptoms. Has had extensive workup for MS completed at [facility] which has been negative. Will check lab work for possible autoimmune cause, will check EMG/NCS. If workup negative  would likely need to check MRI brain on yearly basis to monitor for potential development of demyelinating process such as MS   Jim Like, DO  [facility] [address] [place], [place] [address]  Phone [phone] Fax [phone]

## 2013-08-16 ENCOUNTER — Encounter: Payer: Medicaid Other | Admitting: Diagnostic Neuroimaging

## 2013-11-09 ENCOUNTER — Other Ambulatory Visit: Payer: Medicaid Other

## 2013-11-15 ENCOUNTER — Other Ambulatory Visit: Payer: Medicaid Other

## 2013-11-21 ENCOUNTER — Inpatient Hospital Stay: Admission: RE | Admit: 2013-11-21 | Payer: Medicaid Other | Source: Ambulatory Visit

## 2013-11-27 ENCOUNTER — Encounter: Payer: Self-pay | Admitting: Diagnostic Neuroimaging

## 2013-11-27 ENCOUNTER — Other Ambulatory Visit (INDEPENDENT_AMBULATORY_CARE_PROVIDER_SITE_OTHER): Payer: Self-pay

## 2013-11-27 DIAGNOSIS — Z0289 Encounter for other administrative examinations: Secondary | ICD-10-CM

## 2013-11-28 ENCOUNTER — Other Ambulatory Visit: Payer: Self-pay | Admitting: Neurology

## 2013-11-28 ENCOUNTER — Ambulatory Visit (INDEPENDENT_AMBULATORY_CARE_PROVIDER_SITE_OTHER): Payer: Medicaid Other | Admitting: Diagnostic Neuroimaging

## 2013-11-28 ENCOUNTER — Encounter (INDEPENDENT_AMBULATORY_CARE_PROVIDER_SITE_OTHER): Payer: Self-pay

## 2013-11-28 DIAGNOSIS — R531 Weakness: Secondary | ICD-10-CM

## 2013-11-28 DIAGNOSIS — R202 Paresthesia of skin: Secondary | ICD-10-CM

## 2013-11-28 DIAGNOSIS — R209 Unspecified disturbances of skin sensation: Secondary | ICD-10-CM

## 2013-11-28 DIAGNOSIS — R5381 Other malaise: Secondary | ICD-10-CM

## 2013-11-28 DIAGNOSIS — R5383 Other fatigue: Secondary | ICD-10-CM

## 2013-11-28 DIAGNOSIS — R253 Fasciculation: Secondary | ICD-10-CM

## 2013-11-28 DIAGNOSIS — Z0289 Encounter for other administrative examinations: Secondary | ICD-10-CM

## 2013-11-28 LAB — COMPREHENSIVE METABOLIC PANEL
ALK PHOS: 75 IU/L (ref 39–117)
ALT: 12 IU/L (ref 0–32)
AST: 13 IU/L (ref 0–40)
Albumin/Globulin Ratio: 1.8 (ref 1.1–2.5)
Albumin: 4.2 g/dL (ref 3.5–5.5)
BUN/Creatinine Ratio: 10 (ref 8–20)
BUN: 8 mg/dL (ref 6–20)
CALCIUM: 8.8 mg/dL (ref 8.7–10.2)
CHLORIDE: 99 mmol/L (ref 97–108)
CO2: 22 mmol/L (ref 18–29)
CREATININE: 0.77 mg/dL (ref 0.57–1.00)
GFR calc Af Amer: 114 mL/min/{1.73_m2} (ref >59)
GFR calc non Af Amer: 99 mL/min/{1.73_m2} (ref >59)
GLOBULIN, TOTAL: 2.4 g/dL (ref 1.5–4.5)
Glucose: 78 mg/dL (ref 65–99)
Potassium: 3.5 mmol/L (ref 3.5–5.2)
SODIUM: 139 mmol/L (ref 134–144)
Total Bilirubin: 0.3 mg/dL (ref 0.0–1.2)
Total Protein: 6.6 g/dL (ref 6.0–8.5)

## 2013-11-28 LAB — CK: CK TOTAL: 152 U/L (ref 24–173)

## 2013-11-28 LAB — ANA W/REFLEX IF POSITIVE: Anti Nuclear Antibody(ANA): NEGATIVE

## 2013-11-28 LAB — SEDIMENTATION RATE: SED RATE: 6 mm/h (ref 0–32)

## 2013-11-28 NOTE — Procedures (Signed)
   [facility]  NCS (NERVE CONDUCTION STUDY) WITH EMG (ELECTROMYOGRAPHY) REPORT   STUDY DATE: 11/28/13 PATIENT NAME: Connie Herrera DOB: 1976/02/01 MRN: [id number]  ORDERING CLINICIAN: Jim Like, DO   TECHNOLOGIST: Laretta Alstrom  ELECTROMYOGRAPHER: Earlean Polka. Penumalli, MD  CLINICAL INFORMATION: 38 year old female with left arm and leg numbness, pain and weakness.  FINDINGS: NERVE CONDUCTION STUDY: Left median, left ulnar, left peroneal, left tibial motor responses and F wave latencies are normal. Left median, left ulnar, left peroneal sensory responses are normal.  NEEDLE ELECTROMYOGRAPHY: Needle examination of left deltoid, biceps, triceps, flexor carpi radialis, first dorsal interosseous muscles is normal.  IMPRESSION:  This is a normal study. No electrodiagnostic evidence of large fiber neuropathy or myopathy at this time.   INTERPRETING PHYSICIAN:  Penni Bombard, MD Certified in Neurology, Neurophysiology and Neuroimaging  [facility] [address] [place], [place] [address] [phone]

## 2013-12-17 ENCOUNTER — Encounter (HOSPITAL_COMMUNITY): Payer: Self-pay | Admitting: Emergency Medicine

## 2013-12-17 ENCOUNTER — Emergency Department (HOSPITAL_COMMUNITY)
Admission: EM | Admit: 2013-12-17 | Discharge: 2013-12-17 | Disposition: A | Discharge status: Home / Self Care | Payer: Medicaid Other | Source: Home / Self Care | Attending: Family Medicine | Admitting: Family Medicine

## 2013-12-17 DIAGNOSIS — J039 Acute tonsillitis, unspecified: Secondary | ICD-10-CM

## 2013-12-17 LAB — POCT RAPID STREP A: Streptococcus, Group A Screen (Direct): POSITIVE — AB

## 2013-12-17 MED ORDER — AMOXICILLIN 400 MG/5ML PO SUSR: 800.0000 mg | Freq: Three times a day (TID) | ORAL | Status: AC

## 2013-12-17 NOTE — ED Provider Notes (Signed)
CSN: [id number]     Arrival date & time 12/17/13  1531 History   First MD Initiated Contact with Patient 12/17/13 1546     Chief Complaint  Patient presents with  . Sore Throat   (Consider location/radiation/quality/duration/timing/severity/associated sxs/prior Treatment) Patient is a 38 y.o. female presenting with pharyngitis.  Sore Throat   38 year old female with history of frequent strep tonsillitis presents complaining of sore throat. She had a cold a few days ago that got better. She started to get a severe sore throat with swollen tonsils and swollen glands in her neck yesterday. She also had body aches yesterday better somewhat better today. She has been taking over-the-counter medications without relief. She also admits to a very mild dry cough. No chest pain, shortness of breath, fever, chills, NVD. She is requesting for me to put in an order for her to have her tonsils removed.   Past Medical History  Diagnosis Date  . Brain lesion   . Hypertension    Past Surgical History  Procedure Laterality Date  . Tubal ligation    . Tubal ligation     Family History  Problem Relation Age of Onset  . Cancer Maternal Grandmother 109    breast   History  Substance Use Topics  . Smoking status: Current Every Day Smoker -- 1.00 packs/day for 4 years    Types: Cigarettes  . Smokeless tobacco: Never Used  . Alcohol Use: No   OB History   Grav Para Term Preterm Abortions TAB SAB Ect Mult Living   4 4 4       4      Review of Systems  Constitutional: Negative for fever and chills.  HENT: Positive for sore throat.   Respiratory: Positive for cough.   Musculoskeletal: Positive for arthralgias and myalgias.  Hematological: Positive for adenopathy.  All other systems reviewed and are negative.   Allergies  Hydrocodone  Home Medications   Prior to Admission medications   Medication Sig Start Date End Date Taking? Authorizing Provider  lisinopril (ZESTRIL) 2.5 MG tablet Take  2.5 mg by mouth. 06/30/13 06/30/14 Yes Historical Provider, MD  amoxicillin (AMOXIL) 400 MG/5ML suspension Take 10 mLs (800 mg total) by mouth 3 (three) times daily. 12/17/13 12/24/13  Freeman Caldron Shinichi Anguiano, PA-C  ibuprofen (ADVIL,MOTRIN) 200 MG tablet Take 800 mg by mouth every 6 (six) hours as needed for pain.     Historical Provider, MD   BP 138/79  Pulse 96  Temp(Src) 99.6 F (37.6 C) (Oral)  Resp 16  SpO2 99%  LMP 12/15/2013 Physical Exam  Nursing note and vitals reviewed. Constitutional: She is oriented to person, place, and time. Vital signs are normal. She appears well-developed and well-nourished. No distress.  HENT:  Head: Normocephalic and atraumatic.  Mouth/Throat: Oropharyngeal exudate and posterior oropharyngeal erythema present.  3+ tonsillar enlargement bilaterally, with erythema and exudate  Pulmonary/Chest: Effort normal. No respiratory distress.  Lymphadenopathy:    She has cervical adenopathy (tonsillar\).  Neurological: She is alert and oriented to person, place, and time. She has normal strength. Coordination normal.  Skin: Skin is warm and dry. No rash noted. She is not diaphoretic.  Psychiatric: She has a normal mood and affect. Judgment normal.    ED Course  Procedures (including critical care time) Labs Review Labs Reviewed  POCT RAPID STREP A ([facility] ONLY) - Abnormal; Notable for the following:    Streptococcus, Group A Screen (Direct) POSITIVE (*)    All other components within  normal limits    Imaging Review No results found.   MDM   1. Tonsillitis with exudate    Sure it is positive, treated with amoxicillin. Continue ibuprofen and sore throat lozenges as needed. Advised her to followup with her primary care physician to show [place] again her tonsils taken out, she needs referral to ENT.     Meds ordered this encounter  Medications  . amoxicillin (AMOXIL) 400 MG/5ML suspension    Sig: Take 10 mLs (800 mg total) by mouth 3 (three) times daily.     Dispense:  210 mL    Refill:  0    Order Specific Question:  Supervising Provider    Answer:  Jake Michaelis, DAVID C [[id number]]     Liam Graham, PA-C 12/17/13 1616

## 2013-12-17 NOTE — Discharge Instructions (Signed)

## 2013-12-17 NOTE — ED Notes (Signed)
C/O severe sore throat, swollen tonsils, swollen glands, body aches since yesterday without fevers.  Has been taking Alka Seltzer without relief.  Tonsils enlarged, inflamed with exudate.

## 2013-12-18 NOTE — ED Provider Notes (Signed)
Medical screening examination/treatment/procedure(s) were performed by a resident physician or non-physician practitioner and as the supervising physician I was immediately available for consultation/collaboration.  Linna Darner, MD Family Medicine   Waldemar Dickens, MD 12/18/13 [phone]

## 2014-02-05 ENCOUNTER — Encounter (HOSPITAL_COMMUNITY): Payer: Self-pay | Admitting: Emergency Medicine

## 2014-03-04 ENCOUNTER — Other Ambulatory Visit: Payer: Self-pay | Admitting: Advanced Practice Midwife

## 2014-04-08 ENCOUNTER — Other Ambulatory Visit: Payer: Self-pay | Admitting: Advanced Practice Midwife

## 2014-06-26 ENCOUNTER — Other Ambulatory Visit: Payer: Self-pay | Admitting: Internal Medicine

## 2014-06-26 DIAGNOSIS — N6310 Unspecified lump in the right breast, unspecified quadrant: Secondary | ICD-10-CM

## 2014-06-26 DIAGNOSIS — N6002 Solitary cyst of left breast: Secondary | ICD-10-CM

## 2014-07-11 ENCOUNTER — Ambulatory Visit
Admission: RE | Admit: 2014-07-11 | Discharge: 2014-07-11 | Disposition: A | Discharge status: Home / Self Care | Payer: Medicaid Other | Source: Ambulatory Visit | Attending: Internal Medicine | Admitting: Internal Medicine

## 2014-07-11 DIAGNOSIS — N6002 Solitary cyst of left breast: Secondary | ICD-10-CM

## 2014-07-11 DIAGNOSIS — N6310 Unspecified lump in the right breast, unspecified quadrant: Secondary | ICD-10-CM

## 2015-01-20 ENCOUNTER — Encounter (HOSPITAL_COMMUNITY): Payer: Self-pay | Admitting: *Deleted

## 2015-01-20 ENCOUNTER — Emergency Department (HOSPITAL_COMMUNITY)
Admission: EM | Admit: 2015-01-20 | Discharge: 2015-01-20 | Discharge status: Against Medical Advice | Payer: Medicaid Other | Attending: Emergency Medicine | Admitting: Emergency Medicine

## 2015-01-20 DIAGNOSIS — K130 Diseases of lips: Secondary | ICD-10-CM | POA: Diagnosis not present

## 2015-01-20 DIAGNOSIS — L02212 Cutaneous abscess of back [any part, except buttock]: Secondary | ICD-10-CM | POA: Insufficient documentation

## 2015-01-20 DIAGNOSIS — I10 Essential (primary) hypertension: Secondary | ICD-10-CM | POA: Insufficient documentation

## 2015-01-20 DIAGNOSIS — L02415 Cutaneous abscess of right lower limb: Secondary | ICD-10-CM | POA: Insufficient documentation

## 2015-01-20 DIAGNOSIS — F17210 Nicotine dependence, cigarettes, uncomplicated: Secondary | ICD-10-CM | POA: Diagnosis not present

## 2015-01-20 NOTE — ED Notes (Signed)
Pt states she has to leave to pick up her daughter and states she would have to come back later today. Pt observed ambulating out of ED with steady gait, NAD.

## 2015-01-20 NOTE — ED Notes (Signed)
Pt reports recently being seen for abscess to right upper thigh, bottom llip and back. Was not given antibiotics but reports still has sore on her lips, fatigue and just not feeling well. Denies fever.

## 2015-05-10 ENCOUNTER — Inpatient Hospital Stay (HOSPITAL_COMMUNITY)
Admission: AD | Admit: 2015-05-10 | Discharge: 2015-05-11 | Disposition: A | Discharge status: Home / Self Care | Payer: Medicaid Other | Source: Ambulatory Visit | Attending: Obstetrics & Gynecology | Admitting: Obstetrics & Gynecology

## 2015-05-10 DIAGNOSIS — F17210 Nicotine dependence, cigarettes, uncomplicated: Secondary | ICD-10-CM | POA: Insufficient documentation

## 2015-05-10 DIAGNOSIS — N739 Female pelvic inflammatory disease, unspecified: Secondary | ICD-10-CM | POA: Insufficient documentation

## 2015-05-11 ENCOUNTER — Encounter (HOSPITAL_COMMUNITY): Payer: Self-pay | Admitting: *Deleted

## 2015-05-11 ENCOUNTER — Emergency Department (INDEPENDENT_AMBULATORY_CARE_PROVIDER_SITE_OTHER)
Admission: EM | Admit: 2015-05-11 | Discharge: 2015-05-11 | Disposition: A | Discharge status: Home / Self Care | Payer: Medicaid Other | Source: Home / Self Care | Attending: Emergency Medicine | Admitting: Emergency Medicine

## 2015-05-11 DIAGNOSIS — N739 Female pelvic inflammatory disease, unspecified: Secondary | ICD-10-CM | POA: Diagnosis not present

## 2015-05-11 DIAGNOSIS — R109 Unspecified abdominal pain: Secondary | ICD-10-CM | POA: Diagnosis present

## 2015-05-11 DIAGNOSIS — I10 Essential (primary) hypertension: Secondary | ICD-10-CM

## 2015-05-11 DIAGNOSIS — F17210 Nicotine dependence, cigarettes, uncomplicated: Secondary | ICD-10-CM | POA: Diagnosis not present

## 2015-05-11 LAB — URINALYSIS, ROUTINE W REFLEX MICROSCOPIC
Bilirubin Urine: NEGATIVE
Glucose, UA: NEGATIVE mg/dL
Hgb urine dipstick: NEGATIVE
Ketones, ur: NEGATIVE mg/dL
Nitrite: NEGATIVE
Protein, ur: NEGATIVE mg/dL
Specific Gravity, Urine: >1.03 — ABNORMAL HIGH (ref 1.005–1.030)
pH: 5.5 (ref 5.0–8.0)

## 2015-05-11 LAB — URINE MICROSCOPIC-ADD ON

## 2015-05-11 LAB — POCT PREGNANCY, URINE: Preg Test, Ur: NEGATIVE

## 2015-05-11 LAB — WET PREP, GENITAL
Sperm: NONE SEEN
Yeast Wet Prep HPF POC: NONE SEEN

## 2015-05-11 MED ORDER — DOXYCYCLINE HYCLATE 50 MG PO CAPS: 100.0000 mg | ORAL_CAPSULE | Freq: Two times a day (BID) | ORAL | Status: AC

## 2015-05-11 MED ORDER — HYDROCHLOROTHIAZIDE 12.5 MG PO TABS: 12.5000 mg | ORAL_TABLET | Freq: Every day | ORAL | Status: DC

## 2015-05-11 MED ORDER — CEFTRIAXONE SODIUM 250 MG IJ SOLR
250.0000 mg | Freq: Once | INTRAMUSCULAR | Status: AC
  Administered 2015-05-11: 250 mg via INTRAMUSCULAR
  Filled 2015-05-11: qty 250

## 2015-05-11 MED ORDER — METRONIDAZOLE 500 MG PO TABS: 500.0000 mg | ORAL_TABLET | Freq: Two times a day (BID) | ORAL | Status: AC

## 2015-05-11 NOTE — MAU Note (Signed)
PT called and not in lobby. Pt had told admissions person she was going to car and get her phone and would be right back.

## 2015-05-11 NOTE — Discharge Instructions (Signed)
Your blood pressure is elevated. Please take hydrochlorothiazide 1 pill daily. This will likely make you pee a little more often for the next week. Have a coworker recheck your blood pressure on Wednesday or Thursday to see if it is better. Please call your insurance to find a list of primary care doctors in the area. Follow-up as needed.

## 2015-05-11 NOTE — Discharge Instructions (Signed)
Cervicitis Cervicitis is a soreness and swelling (inflammation) of the cervix. Your cervix is located at the bottom of your uterus. It opens up to the vagina. CAUSES   Sexually transmitted infections (STIs).   Allergic reaction.   Medicines or birth control devices that are put in the vagina.   Injury to the cervix.   Bacterial infections.  RISK FACTORS You are at greater risk if you:  Have unprotected sexual intercourse.  Have sexual intercourse with many partners.  Began sexual intercourse at an early age.  Have a history of STIs. SYMPTOMS  There may be no symptoms. If symptoms occur, they may include:   Gray, white, yellow, or bad-smelling vaginal discharge.   Pain or itching of the area outside the vagina.   Painful sexual intercourse.   Lower abdominal or lower back pain, especially during intercourse.   Frequent urination.   Abnormal vaginal bleeding between periods, after sexual intercourse, or after menopause.   Pressure or a heavy feeling in the pelvis.  DIAGNOSIS  Diagnosis is made after a pelvic exam. Other tests may include:   Examination of any discharge under a microscope (wet prep).   A Pap test.  TREATMENT  Treatment will depend on the cause of cervicitis. If it is caused by an STI, both you and your partner will need to be treated. Antibiotic medicines will be given.  HOME CARE INSTRUCTIONS   Do not have sexual intercourse until your health care provider says it is okay.   Do not have sexual intercourse until your partner has been treated, if your cervicitis is caused by an STI.   Take your antibiotics as directed. Finish them even if you start to feel better.  SEEK MEDICAL CARE IF:  Your symptoms come back.   You have a fever.  MAKE SURE YOU:   Understand these instructions.  Will watch your condition.  Will get help right away if you are not doing well or get worse.   This information is not intended to replace  advice given to you by your health care provider. Make sure you discuss any questions you have with your health care provider.   Document Released: 03/23/2005 Document Revised: 03/28/2013 Document Reviewed: 09/14/2012 Elsevier Interactive Patient Education 2016 Elsevier Inc[employer]  Pelvic Inflammatory Disease Pelvic inflammatory disease (PID) refers to an infection in some or all of the female organs. The infection can be in the uterus, ovaries, fallopian tubes, or the surrounding tissues in the pelvis. PID can cause abdominal or pelvic pain that comes on suddenly (acute pelvic pain). PID is a serious infection because it can lead to lasting (chronic) pelvic pain or the inability to have children (infertility). CAUSES This condition is most often caused by an infection that is spread during sexual contact. However, the infection can also be caused by the normal bacteria that are found in the vaginal tissues if these bacteria travel upward into the reproductive organs. PID can also occur following:  The birth of a baby.  A miscarriage.  An abortion.  Major pelvic surgery.  The use of an intrauterine device (IUD).  A sexual assault. RISK FACTORS This condition is more likely to develop in women who:  Are younger than 40 years of age.  Are sexually active at [facility] age.  Use nonbarrier contraception.  Have multiple sexual partners.  Have sex with someone who has symptoms of an STD (sexually transmitted disease).  Use oral contraception. At times, certain behaviors can also increase the possibility of  getting PID, such as:  Using a vaginal douche.  Having an IUD in place. SYMPTOMS Symptoms of this condition include:  Abdominal or pelvic pain.  Fever.  Chills.  Abnormal vaginal discharge.  Abnormal uterine bleeding.  Unusual pain shortly after the end of a menstrual period.  Painful urination.  Pain with sexual intercourse.  Nausea and vomiting. DIAGNOSIS To  diagnose this condition, your health care provider will do a physical exam and take your medical history. A pelvic exam typically reveals great tenderness in the uterus and the surrounding pelvic tissues. You may also have tests, such as:  Lab tests, including a pregnancy test, blood tests, and urine test.  Culture tests of the vagina and cervix to check for an STD.  Ultrasound.  A laparoscopic procedure to look inside the pelvis.  Examining vaginal secretions under a microscope. TREATMENT Treatment for this condition may involve one or more approaches.  Antibiotic medicines may be prescribed to be taken by mouth.  Sexual partners may need to be treated if the infection is caused by an STD.  For more severe cases, hospitalization may be needed to give antibiotics directly into a vein through an IV tube.  Surgery may be needed if other treatments do not help, but this is rare. It may take weeks until you are completely well. If you are diagnosed with PID, you should also be checked for human immunodeficiency virus (HIV). Your health care provider may test you for infection again 3 months after treatment. You should not have unprotected sex. HOME CARE INSTRUCTIONS  Take over-the-counter and prescription medicines only as told by your health care provider.  If you were prescribed an antibiotic medicine, take it as told by your health care provider. Do not stop taking the antibiotic even if you start to feel better.  Do not have sexual intercourse until treatment is completed or as told by your health care provider. If PID is confirmed, your recent sexual partners will need treatment, especially if you had unprotected sex.  Keep all follow-up visits as told by your health care provider. This is important. SEEK MEDICAL CARE IF:  You have increased or abnormal vaginal discharge.  Your pain does not improve.  You vomit.  You have a fever.  You cannot tolerate your medicines.  Your  partner has an STD.  You have pain when you urinate. SEEK IMMEDIATE MEDICAL CARE IF:  You have increased abdominal or pelvic pain.  You have chills.  Your symptoms are not better in 72 hours even with treatment.   This information is not intended to replace advice given to you by your health care provider. Make sure you discuss any questions you have with your health care provider.   Document Released: 03/23/2005 Document Revised: 12/12/2014 Document Reviewed: 04/30/2014 Elsevier Interactive Patient Education [employer].

## 2015-05-11 NOTE — ED Notes (Signed)
Presents for HTN.  Has hx - was placed on lisinopril by a doctor through [facility] when getting tested for MS, but pt stopped approx 2 mo ago due to side effect of constant cough.  Was seen and treated for P[facility] last [facility]  Pt states she's feeling better.  Denies any HA, blurred vision, or any other c/o's at this time, but was concerned about HTN during visit last night.

## 2015-05-11 NOTE — MAU Note (Signed)
Having abd pain and vag d/c. Yellow vag discharge. Symptoms present 4-5 days

## 2015-05-11 NOTE — ED Provider Notes (Signed)
CSN: [id number]     Arrival date & time 05/11/15  1522 History   First MD Initiated Contact with Patient 05/11/15 1556     Chief Complaint  Patient presents with  . Hypertension   (Consider location/radiation/quality/duration/timing/severity/associated sxs/prior Treatment) HPI  She is a 40 year old woman here for hypertension.  She states she was diagnosed with hypertension last year during a workup for MS. At that time she was started on lisinopril 2.5 mg daily.  She stopped this medication about 3 months ago due to a constant cough. She denies any chest pain or shortness of breath. She does get occasional headaches associated with her menstrual cycle. She did have a brief episode of blurred vision this morning, but it has resolved. She was seen at [facility] last night and treated for PID and also was told her blood pressure was elevated.  Past Medical History  Diagnosis Date  . Brain lesion   . Hypertension    Past Surgical History  Procedure Laterality Date  . Tubal ligation    . Tubal ligation     Family History  Problem Relation Age of Onset  . Cancer Maternal Grandmother 25    breast   Social History  Substance Use Topics  . Smoking status: Current Every Day Smoker -- 1.00 packs/day for 4 years    Types: Cigarettes  . Smokeless tobacco: Never Used  . Alcohol Use: No   OB History    Gravida Para Term Preterm AB TAB SAB Ectopic Multiple Living   4 4 4       4      Review of Systems As in history of present illness Allergies  Hydrocodone  Home Medications   Prior to Admission medications   Medication Sig Start Date End Date Taking? Authorizing Provider  acyclovir (ZOVIRAX) 200 MG capsule TAKE ONE CAPSULE BY MOUTH AS DIRECTED FOUR CAPSULES TWICE DAILY FOR 5 DAYS FOR OUTBREAKS,  2 CAPSULES  TWICE DAILY  FOR  SUPPRESSION 04/09/14  Yes Manya Silvas, CNM  doxycycline (VIBRAMYCIN) 50 MG capsule Take 2 capsules (100 mg total) by mouth 2 (two) times daily. 05/11/15  05/25/15 Yes Lori A Clemmons, CNM  metroNIDAZOLE (FLAGYL) 500 MG tablet Take 1 tablet (500 mg total) by mouth 2 (two) times daily. 05/11/15 05/25/15 Yes Lori A Clemmons, CNM  hydrochlorothiazide (HYDRODIURIL) 12.5 MG tablet Take 1 tablet (12.5 mg total) by mouth daily. 05/11/15   Melony Overly, MD  ibuprofen (ADVIL,MOTRIN) 200 MG tablet Take 800 mg by mouth every 6 (six) hours as needed for pain.     Historical Provider, MD   Meds Ordered and Administered this Visit  Medications - No data to display  BP 140/100 mmHg  Pulse 80  Temp(Src) 98.7 F (37.1 C) (Oral)  SpO2 99%  LMP 04/26/2015 (Exact Date) No data found.   Physical Exam  Constitutional: She is oriented to person, place, and time. She appears well-developed and well-nourished. No distress.  Cardiovascular: Normal rate, regular rhythm and normal heart sounds.   Pulmonary/Chest: Effort normal and breath sounds normal. No respiratory distress. She has no wheezes. She has no rales.  Neurological: She is alert and oriented to person, place, and time.    ED Course  Procedures (including critical care time)  Labs Review Labs Reviewed - No data to display  Imaging Review No results found.    MDM   1. Essential hypertension    We'll start her on low-dose HCTZ. She will call her insurance persistent finding  a primary care provider. She works at an assisted living facility. I have requested that she have a coworker check her blood pressure in the middle to end of next week. Follow-up as needed.    Melony Overly, MD 05/11/15 [phone]

## 2015-05-11 NOTE — Progress Notes (Signed)
Written and verbal d/c instructions given and understanding voiced. 

## 2015-05-11 NOTE — MAU Provider Note (Signed)
  History     CSN: [id number]  Arrival date and time: 05/10/15 2342   None     Chief Complaint  Patient presents with  . Abdominal Pain   HPI  Connie Herrera 40 y.o. [id number] presents with the complaint of odor and vaginal discharge accompainied by adominal pain. States her main problem is the vaginal discharge. States she has not had sex in 3-4 months.  Past Medical History  Diagnosis Date  . Brain lesion   . Hypertension     Past Surgical History  Procedure Laterality Date  . Tubal ligation    . Tubal ligation      Family History  Problem Relation Age of Onset  . Cancer Maternal Grandmother 3    breast    Social History  Substance Use Topics  . Smoking status: Current Every Day Smoker -- 1.00 packs/day for 4 years    Types: Cigarettes  . Smokeless tobacco: Never Used  . Alcohol Use: No    Allergies:  Allergies  Allergen Reactions  . Hydrocodone Nausea And Vomiting    Patient states that she can take medication it just makes her have bad nausea    Prescriptions prior to admission  Medication Sig Dispense Refill Last Dose  . acyclovir (ZOVIRAX) 200 MG capsule TAKE ONE CAPSULE BY MOUTH AS DIRECTED FOUR CAPSULES TWICE DAILY FOR 5 DAYS FOR OUTBREAKS,  2 CAPSULES  TWICE DAILY  FOR  SUPPRESSION 30 capsule 6   . ibuprofen (ADVIL,MOTRIN) 200 MG tablet Take 800 mg by mouth every 6 (six) hours as needed for pain.    Taking  . lisinopril (ZESTRIL) 2.5 MG tablet Take 2.5 mg by mouth.   Taking    Review of Systems  Constitutional: Negative for fever.  Gastrointestinal: Positive for abdominal pain.  Genitourinary:       Vaginal discharge and odor Vaginal irritation  All other systems reviewed and are negative.  Physical Exam   Blood pressure 155/100, pulse 95, temperature 98.3 F (36.8 C), resp. rate 18, height 5' 7.5" (1.715 m), weight 86.274 kg (190 lb 3.2 oz), last menstrual period 04/26/2015.  Physical Exam  Nursing note and vitals  reviewed. Constitutional: She is oriented to person, place, and time. She appears well-developed and well-nourished. No distress.  HENT:  Head: Normocephalic and atraumatic.  Cardiovascular: Normal rate.   Respiratory: Effort normal. No respiratory distress.  GI: Soft. She exhibits no distension and no mass. There is no tenderness. There is no rebound and no guarding.  Genitourinary:  Pt has very reddened and irritated vaginal mucosa and is tender to the touch. Bimanual exam -CMT  Musculoskeletal: Normal range of motion.  Neurological: She is alert and oriented to person, place, and time.  Skin: Skin is warm and dry.  Psychiatric: She has a normal mood and affect. Her behavior is normal. Judgment and thought content normal.    MAU Course  Procedures  MDM Based on bimanual exam with CMT pt will be treated for PID  Assessment and Plan  PID GC Chlamydia Cx's Wet prep Rocephin 250mg  Im Doxycycline 100mg  BID x 14 days Flagyl 500mg  Bid x 14 days Discharge  Clemmons,Lori Grissett 05/11/2015, 12:48 AM

## 2015-05-13 LAB — GC/CHLAMYDIA PROBE AMP (~~LOC~~) NOT AT ARMC
Chlamydia: NEGATIVE
Neisseria Gonorrhea: NEGATIVE

## 2015-05-18 ENCOUNTER — Encounter (HOSPITAL_COMMUNITY): Payer: Self-pay | Admitting: Nurse Practitioner

## 2015-05-18 ENCOUNTER — Emergency Department (HOSPITAL_COMMUNITY)
Admission: EM | Admit: 2015-05-18 | Discharge: 2015-05-18 | Disposition: A | Discharge status: Home / Self Care | Payer: Medicaid Other | Attending: Emergency Medicine | Admitting: Emergency Medicine

## 2015-05-18 DIAGNOSIS — Z8669 Personal history of other diseases of the nervous system and sense organs: Secondary | ICD-10-CM | POA: Diagnosis not present

## 2015-05-18 DIAGNOSIS — R252 Cramp and spasm: Secondary | ICD-10-CM | POA: Insufficient documentation

## 2015-05-18 DIAGNOSIS — Z792 Long term (current) use of antibiotics: Secondary | ICD-10-CM | POA: Diagnosis not present

## 2015-05-18 DIAGNOSIS — N939 Abnormal uterine and vaginal bleeding, unspecified: Secondary | ICD-10-CM | POA: Insufficient documentation

## 2015-05-18 DIAGNOSIS — F17210 Nicotine dependence, cigarettes, uncomplicated: Secondary | ICD-10-CM | POA: Insufficient documentation

## 2015-05-18 DIAGNOSIS — I10 Essential (primary) hypertension: Secondary | ICD-10-CM | POA: Diagnosis not present

## 2015-05-18 DIAGNOSIS — Z79899 Other long term (current) drug therapy: Secondary | ICD-10-CM | POA: Diagnosis not present

## 2015-05-18 LAB — I-STAT BETA HCG BLOOD, ED (MC, WL, AP ONLY)

## 2015-05-18 LAB — CBC
HCT: 39.8 % (ref 36.0–46.0)
HEMOGLOBIN: 13.3 g/dL (ref 12.0–15.0)
MCH: 30.1 pg (ref 26.0–34.0)
MCHC: 33.4 g/dL (ref 30.0–36.0)
MCV: 90 fL (ref 78.0–100.0)
PLATELETS: 299 10*3/uL (ref 150–400)
RBC: 4.42 MIL/uL (ref 3.87–5.11)
RDW: 12 % (ref 11.5–15.5)
WBC: 4.8 10*3/uL (ref 4.0–10.5)

## 2015-05-18 MED ORDER — IBUPROFEN 200 MG PO TABS
600.0000 mg | ORAL_TABLET | Freq: Once | ORAL | Status: AC
  Administered 2015-05-18: 600 mg via ORAL
  Filled 2015-05-18: qty 3

## 2015-05-18 NOTE — ED Notes (Signed)
Pt c/o 2 day history of heavy vaginal bleeding and pelvic cramps. She reports it is time for her normal cycle but she has never bled this heavily. Reports changing her pad every hour. Denies n/v/fevers. She is concerned that it may be related to her new BP medication HCTZ that she started 2 days ago.

## 2015-05-18 NOTE — ED Provider Notes (Signed)
CSN: [id number]     Arrival date & time 05/18/15  1243 History   First MD Initiated Contact with Patient 05/18/15 1401     Chief Complaint  Patient presents with  . Vaginal Bleeding   (Consider location/radiation/quality/duration/timing/severity/associated sxs/prior Treatment) Patient is a 40 y.o. female presenting with vaginal bleeding. The history is provided by the patient. No language interpreter was used.  Vaginal Bleeding Associated symptoms: no dysuria, no fever, no nausea and no vaginal discharge     Connie Herrera is a 40 y.o female with a history of uncontrolled hypertension who presents for vaginal bleeding with pelvic cramps x 2 days. She states she woke up in a pool of blood. When asked when her last menstrual period was she states, "should be now." Denies having a history of vaginal bleeding when not on her menstrual cycle. States she has gone through 1-2 pads every 2 hours. Reports some clots. She thinks her vaginal bleeding is related to her starting hydrochlorothiazide. She is requesting a new blood pressure medication. She is prescribed medication at an urgent care. Denies having a PCP. Denies any fever, chills, night sweats, nausea, vomiting, dysuria, hematuria, urinary frequency. Denies being on birth control. She had a tubal ligation.  Past Medical History  Diagnosis Date  . Brain lesion   . Hypertension    Past Surgical History  Procedure Laterality Date  . Tubal ligation    . Tubal ligation     Family History  Problem Relation Age of Onset  . Cancer Maternal Grandmother 80    breast   Social History  Substance Use Topics  . Smoking status: Current Every Day Smoker -- 1.00 packs/day for 4 years    Types: Cigarettes  . Smokeless tobacco: Never Used  . Alcohol Use: No   OB History    Gravida Para Term Preterm AB TAB SAB Ectopic Multiple Living   4 4 4       4      Review of Systems  Constitutional: Negative for fever and chills.  Gastrointestinal:  Negative for nausea and vomiting.  Genitourinary: Positive for vaginal bleeding and menstrual problem. Negative for dysuria, frequency, vaginal discharge and pelvic pain.  All other systems reviewed and are negative.     Allergies  Hydrocodone  Home Medications   Prior to Admission medications   Medication Sig Start Date End Date Taking? Authorizing Provider  acyclovir (ZOVIRAX) 200 MG capsule TAKE ONE CAPSULE BY MOUTH AS DIRECTED FOUR CAPSULES TWICE DAILY FOR 5 DAYS FOR OUTBREAKS,  2 CAPSULES  TWICE DAILY  FOR  SUPPRESSION 04/09/14  Yes Manya Silvas, CNM  doxycycline (VIBRAMYCIN) 50 MG capsule Take 2 capsules (100 mg total) by mouth 2 (two) times daily. 05/11/15 05/25/15 Yes Lori A Clemmons, CNM  hydrochlorothiazide (HYDRODIURIL) 12.5 MG tablet Take 1 tablet (12.5 mg total) by mouth daily. 05/11/15  Yes Melony Overly, MD  ibuprofen (ADVIL,MOTRIN) 200 MG tablet Take 800 mg by mouth every 6 (six) hours as needed for pain.    Yes Historical Provider, MD  metroNIDAZOLE (FLAGYL) 500 MG tablet Take 1 tablet (500 mg total) by mouth 2 (two) times daily. 05/11/15 05/25/15 Yes Lori A Clemmons, CNM   BP 146/103 mmHg  Pulse 73  Temp(Src) 98 F (36.7 C) (Oral)  Resp 16  Ht 5\' 7"  (1.702 m)  Wt 84.55 kg  BMI 29.19 kg/m2  SpO2 100%  LMP 04/26/2015 Physical Exam  Constitutional: She is oriented to person, place, and time. She appears well-developed and  well-nourished.  HENT:  Head: Normocephalic and atraumatic.  Eyes: Conjunctivae are normal.  Neck: Normal range of motion. Neck supple.  Cardiovascular: Normal rate, regular rhythm and normal heart sounds.   No tachycardia or shortness of breath.  Pulmonary/Chest: Effort normal and breath sounds normal.  Abdominal: Soft. There is no tenderness.  No pelvic tenderness to palpation. No guarding or rebound.  Genitourinary: Pelvic exam was performed with patient supine. There is bleeding in the vagina. No tenderness in the vagina. No foreign body around the  vagina.  Pelvic exam: Chaperone present. Bleeding from the cervical os but no clots. No adnexal tenderness. No CMT.  Musculoskeletal: Normal range of motion.  Neurological: She is alert and oriented to person, place, and time.  Skin: Skin is warm and dry.  Nursing note and vitals reviewed.   ED Course  Procedures (including critical care time) Labs Review Labs Reviewed  CBC  I-STAT BETA HCG BLOOD, ED ([facility], [facility], [facility] ONLY)    Imaging Review No results found. I have personally reviewed and evaluated these lab results as part of my medical decision-making.   EKG Interpretation None      MDM   Final diagnoses:  Vaginal bleeding   Patient presents for vaginal bleeding that began 2 days ago. She reports that she should be starting her period today. No increased abdominal pain or cramping. Also states she has a headache. She reports a change in her medication recently. She started hydrochlorothiazide 2 days ago and began having the vaginal bleeding soon after. She believes that they are related. This is not a normal side effect from hydrochlorothiazide. She also reports that her blood pressure still not controlled. She states the medication is not working. When asked if she checked her blood pressure daily she stated she does not. I asked her how she knew it wasn't working and she said that we took her blood pressure here in the ED today and it was still high. She is requesting a medication change. I discussed following up with her PCP to have her medications evaluated. She says that she does not have one and she goes to an urgent care. I discussed following up with her Medicaid doctor or [place] and wellness. Her vaginal exam shows bleeding from the cervix which could be related to her menstrual cycle. Her hemoglobin was 13.3. She denies any dizziness, chest pain, shortness of breath, or lightheadedness. Beta hCG was negative. I discussed [facility] with [facility]. I also  discussed return precautions. She was told to take ibuprofen for her headache. Patient agrees with plan. Medications  ibuprofen (ADVIL,MOTRIN) tablet 600 mg (600 mg Oral Given 05/18/15 1502)   Filed Vitals:   05/18/15 1259  BP: 146/103  Pulse: 73  Temp: 98 F (36.7 C)  Resp: [address] PA-C 05/18/15 1531  Pattricia Boss, MD 05/19/15 [phone]

## 2015-06-16 ENCOUNTER — Other Ambulatory Visit: Payer: Self-pay | Admitting: Certified Nurse Midwife

## 2015-08-01 ENCOUNTER — Other Ambulatory Visit: Payer: Self-pay

## 2015-08-01 DIAGNOSIS — N6002 Solitary cyst of left breast: Secondary | ICD-10-CM

## 2015-08-01 DIAGNOSIS — N6310 Unspecified lump in the right breast, unspecified quadrant: Secondary | ICD-10-CM

## 2015-11-18 ENCOUNTER — Other Ambulatory Visit (HOSPITAL_COMMUNITY): Payer: Self-pay | Admitting: *Deleted

## 2015-11-18 ENCOUNTER — Telehealth (HOSPITAL_COMMUNITY): Payer: Self-pay | Admitting: *Deleted

## 2015-11-18 DIAGNOSIS — N6320 Unspecified lump in the left breast, unspecified quadrant: Secondary | ICD-10-CM

## 2015-11-18 NOTE — Telephone Encounter (Signed)
Patient had left message on voicemail. Tried to return call but voicemail box not set up. Unable to leave message.

## 2015-11-20 ENCOUNTER — Encounter (HOSPITAL_COMMUNITY): Payer: Self-pay | Admitting: *Deleted

## 2015-11-21 ENCOUNTER — Ambulatory Visit (HOSPITAL_COMMUNITY): Payer: Medicaid Other

## 2015-11-21 ENCOUNTER — Other Ambulatory Visit: Payer: Medicaid Other

## 2015-11-27 ENCOUNTER — Inpatient Hospital Stay (HOSPITAL_COMMUNITY)
Admission: AD | Admit: 2015-11-27 | Discharge: 2015-11-27 | Disposition: A | Discharge status: Home / Self Care | Payer: Medicaid Other | Source: Ambulatory Visit | Attending: Obstetrics & Gynecology | Admitting: Obstetrics & Gynecology

## 2015-11-27 ENCOUNTER — Encounter (HOSPITAL_COMMUNITY): Payer: Self-pay

## 2015-11-27 ENCOUNTER — Other Ambulatory Visit (HOSPITAL_COMMUNITY): Payer: Self-pay | Admitting: *Deleted

## 2015-11-27 DIAGNOSIS — N6320 Unspecified lump in the left breast, unspecified quadrant: Secondary | ICD-10-CM

## 2015-11-27 DIAGNOSIS — Z79899 Other long term (current) drug therapy: Secondary | ICD-10-CM | POA: Insufficient documentation

## 2015-11-27 DIAGNOSIS — I10 Essential (primary) hypertension: Secondary | ICD-10-CM | POA: Insufficient documentation

## 2015-11-27 DIAGNOSIS — N644 Mastodynia: Secondary | ICD-10-CM

## 2015-11-27 DIAGNOSIS — N6019 Diffuse cystic mastopathy of unspecified breast: Secondary | ICD-10-CM | POA: Insufficient documentation

## 2015-11-27 DIAGNOSIS — F17210 Nicotine dependence, cigarettes, uncomplicated: Secondary | ICD-10-CM | POA: Insufficient documentation

## 2015-11-27 DIAGNOSIS — N6012 Diffuse cystic mastopathy of left breast: Secondary | ICD-10-CM

## 2015-11-27 LAB — POCT PREGNANCY, URINE: PREG TEST UR: NEGATIVE

## 2015-11-27 MED ORDER — IBUPROFEN 800 MG PO TABS: 800.0000 mg | ORAL_TABLET | Freq: Three times a day (TID) | ORAL | 1 refills | Status: DC | PRN

## 2015-11-27 NOTE — MAU Provider Note (Signed)
Chief Complaint: Breast Mass   First Provider Initiated Contact with Patient 11/27/15 1253      SUBJECTIVE HPI: Connie Herrera is a 40 y.o. [id number] who presents to maternity admissions reporting painful lump of left breast x 1 month, with worsening pain and increasing size of lump in last week.  She recently enrolled in [facility] program and has diagnostic mammogram scheduled on 9/7.  She reports her grandmother died of breast cancer so she is worried. She has not tried anything for her pain, reporting that narcotics make her vomit and ibuprofen makes her sleepy.  There are no associated symptoms. She denies vaginal bleeding, vaginal itching/burning, urinary symptoms, h/a, dizziness, n/v, or fever/chills.     HPI  Past Medical History:  Diagnosis Date  . Brain lesion   . Hypertension    Past Surgical History:  Procedure Laterality Date  . TUBAL LIGATION    . TUBAL LIGATION     Social History   Social History  . Marital status: Single    Spouse name: N/A  . Number of children: 4  . Years of education: 14   Occupational History  .  [employer]   Social History Main Topics  . Smoking status: Current Every Day Smoker    Packs/day: 0.50    Years: 4.00    Types: Cigarettes  . Smokeless tobacco: Current User  . Alcohol use No  . Drug use: No  . Sexual activity: Not on file   Other Topics Concern  . Not on file   Social History Narrative   Patient lives at home with children.    Patient has 4 children.    Patient is single.    Patient has 2 years of college.    Patient is right handed.    Patient is working full time. [employer].    No current facility-administered medications on file prior to encounter.    Current Outpatient Prescriptions on File Prior to Encounter  Medication Sig Dispense Refill  . acyclovir (ZOVIRAX) 200 MG capsule TAKE ONE CAPSULE BY MOUTH AS DIRECTED FOUR CAPSULES TWICE DAILY FOR 5 DAYS FOR OUTBREAKS,  2 CAPSULES   TWICE DAILY  FOR  SUPPRESSION 30 capsule 6  . ibuprofen (ADVIL,MOTRIN) 200 MG tablet Take 800 mg by mouth every 6 (six) hours as needed for mild pain.     . hydrochlorothiazide (HYDRODIURIL) 12.5 MG tablet Take 1 tablet (12.5 mg total) by mouth daily. (Patient not taking: Reported on 11/27/2015) 30 tablet 0  . [DISCONTINUED] lisinopril (ZESTRIL) 2.5 MG tablet Take 2.5 mg by mouth.     Allergies  Allergen Reactions  . Hydrocodone Nausea And Vomiting    Patient states that she can take medication it just makes her have bad nausea    ROS:  Review of Systems  Constitutional: Negative for chills, fatigue and fever.  Respiratory: Negative for shortness of breath.   Cardiovascular: Negative for chest pain.  Gastrointestinal: Negative for nausea and vomiting.  Genitourinary: Negative for difficulty urinating, dysuria, flank pain, pelvic pain, vaginal bleeding, vaginal discharge and vaginal pain.  Musculoskeletal:       Pain in left breast in upper outer quadrant  Neurological: Negative for dizziness and headaches.  Psychiatric/Behavioral: Negative.      I have reviewed patient's Past Medical Hx, Surgical Hx, Family Hx, Social Hx, medications and allergies.   Physical Exam  Patient Vitals for the past 24 hrs:  BP Temp Temp src Pulse Resp Height Weight  11/27/15 1147 142/94 98.8 F (37.1 C) Oral 95 18 5\' 7"  (1.702 m) 170 lb (77.1 kg)   Constitutional: Well-developed, well-nourished female in no acute distress.  Cardiovascular: normal rate Respiratory: normal effort Breast:  Exam of left breast reveals several smooth, round and rope-like, mobile masses ranging from 0.5 cm to 2-3 cm in size that are tender to palpation GI: Abd soft, non-tender. Pos BS x 4 MS: Extremities nontender, no edema, normal ROM Neurologic: Alert and oriented x 4.  GU: Neg CVAT.   LAB RESULTS Results for orders placed or performed during the hospital encounter of 11/27/15 (from the past 24 hour(s))  Pregnancy,  urine POC     Status: None   Collection Time: 11/27/15 11:43 AM  Result Value Ref Range   Preg Test, Ur NEGATIVE NEGATIVE       IMAGING No results found.  MAU Management/MDM: Exam c/w fibrocystic breast changes.  Pt drinking lots of caffeine daily with recent increase in amount.  Recommend decreased caffeine intake and [facility] as scheduled.  Reassurance provided.  Rx for ibuprofen 800 mg Q 6 hours PRN. Heat/ice to area for comfort. Pt stable at time of discharge.  ASSESSMENT 1. Fibrocystic breast changes, left   2. Painful lumpy left breast     PLAN Discharge home   Medication List    TAKE these medications   acyclovir 200 MG capsule Commonly known as:  ZOVIRAX TAKE ONE CAPSULE BY MOUTH AS DIRECTED FOUR CAPSULES TWICE DAILY FOR 5 DAYS FOR OUTBREAKS,  2 CAPSULES  TWICE DAILY  FOR  SUPPRESSION   hydrochlorothiazide 12.5 MG tablet Commonly known as:  HYDRODIURIL Take 1 tablet (12.5 mg total) by mouth daily.   ibuprofen 200 MG tablet Commonly known as:  ADVIL,MOTRIN Take 800 mg by mouth every 6 (six) hours as needed for mild pain.      Follow-up Information    [facility] .   Specialty:  Diagnostic Radiology Why:  As scheduled on 12/12/15, return to MAU as needed for emergencies Contact information: [place] [address] [address] Certified Nurse-Midwife 11/27/2015  2:24 PM

## 2015-11-27 NOTE — Discharge Instructions (Signed)
Fibrocystic Breast Changes °Fibrocystic breast changes occur when breast ducts become blocked, causing painful, fluid-filled lumps (cysts) to form in the breast. This is a common condition that is noncancerous (benign). It occurs when women go through hormonal changes during their menstrual cycle. Fibrocystic breast changes can affect one or both breasts. °CAUSES  °The exact cause of fibrocystic breast changes is not known, but it may be related to the female hormones estrogen and progesterone. Family traits that get passed from parent to child (genetics) may also be a factor in some cases. °SIGNS AND SYMPTOMS  °· Tenderness, mild discomfort, or pain.   °· Swelling.   °· Rope-like feeling when touching the breast.   °· Lumpy breast, one or both sides.   °· Changes in breast size, especially before (larger) and after (smaller) the menstrual period.   °· Green or dark brown nipple discharge (not blood).   °Symptoms are usually worse before menstrual periods start and get better toward the end of the menstrual period.  °DIAGNOSIS  °To make a diagnosis, your health care provider will ask you questions and perform a physical exam of your breasts. The health care provider may recommend other tests that can examine inside your breasts, such as: °· A breast X-ray (mammogram).   °· Ultrasonography.  °· An MRI.   °If something more than fibrocystic breast changes is suspected, your health care provider may take a breast tissue sample (breast biopsy) to examine. °TREATMENT  °Often, treatment is not needed. Your health care provider may recommend over-the-counter pain relievers to help lessen pain or discomfort caused by the fibrocystic breast changes. You may also be asked to change your diet to limit or stop eating foods or drinking beverages that contain caffeine. Foods and beverages that contain caffeine include chocolate, soda, coffee, and tea. Reducing sugar and fat in your diet may also help. Your health care provider  may also recommend: °· Fine needle aspiration to remove fluid from a cyst that is causing pain.   °· Surgery to remove a large, persistent, and tender cyst. °HOME CARE INSTRUCTIONS  °· Examine your breasts after every menstrual period. If you do not have menstrual periods, check your breasts the first day of every month. Feel for changes, such as more tenderness, a new growth, a change in breast size, or a change in a lump that has always been there.   °· Only take over-the-counter or prescription medicine as directed by your health care provider.   °· Wear a well-fitted support or sports bra, especially when exercising.   °· Decrease or avoid caffeine, fat, and sugar in your diet as directed by your health care provider.   °SEEK MEDICAL CARE IF:  °· You have fluid leaking (discharge) from your nipples, especially bloody discharge.   °· You have new lumps or bumps in the breast.   °· Your breast or breasts become enlarged, red, and painful.   °· You have areas of your breast that pucker in.   °· Your nipples appear flat or indented.   °  °This information is not intended to replace advice given to you by your health care provider. Make sure you discuss any questions you have with your health care provider. °  °Document Released: 01/07/2006 Document Revised: 12/12/2014 Document Reviewed: 09/11/2012 °Elsevier Interactive Patient Education ©2016 Elsevier Inc. ° ° °

## 2015-11-28 ENCOUNTER — Other Ambulatory Visit (HOSPITAL_COMMUNITY): Payer: Self-pay | Admitting: Obstetrics and Gynecology

## 2015-11-28 ENCOUNTER — Ambulatory Visit (HOSPITAL_COMMUNITY)
Admission: RE | Admit: 2015-11-28 | Discharge: 2015-11-28 | Disposition: A | Discharge status: Home / Self Care | Payer: Self-pay | Source: Ambulatory Visit | Attending: Obstetrics and Gynecology | Admitting: Obstetrics and Gynecology

## 2015-11-28 ENCOUNTER — Ambulatory Visit
Admission: RE | Admit: 2015-11-28 | Discharge: 2015-11-28 | Disposition: A | Discharge status: Home / Self Care | Payer: No Typology Code available for payment source | Source: Ambulatory Visit | Attending: Obstetrics and Gynecology | Admitting: Obstetrics and Gynecology

## 2015-11-28 ENCOUNTER — Ambulatory Visit
Admission: RE | Admit: 2015-11-28 | Discharge: 2015-11-28 | Disposition: A | Discharge status: Home / Self Care | Payer: Self-pay | Source: Ambulatory Visit | Attending: Obstetrics and Gynecology | Admitting: Obstetrics and Gynecology

## 2015-11-28 ENCOUNTER — Encounter (HOSPITAL_COMMUNITY): Payer: Self-pay

## 2015-11-28 VITALS — BP 120/82 | Temp 98.2°F | Ht 67.0 in | Wt 187.0 lb

## 2015-11-28 DIAGNOSIS — N6320 Unspecified lump in the left breast, unspecified quadrant: Secondary | ICD-10-CM

## 2015-11-28 DIAGNOSIS — N6325 Unspecified lump in the left breast, overlapping quadrants: Secondary | ICD-10-CM

## 2015-11-28 DIAGNOSIS — Z01419 Encounter for gynecological examination (general) (routine) without abnormal findings: Secondary | ICD-10-CM

## 2015-11-28 NOTE — Patient Instructions (Addendum)
Explained breast self awareness to Connie Herrera. Let her know [facility] will cover Pap smears and HPV typing every 5 years unless has a history of abnormal Pap smears. Referred patient to the [facility] for a bilateral diagnostic mammogram and ultrasound. Appointment scheduled for Thursday, November 28, 2015 at 0930. Let patient know will follow up with her within the next couple weeks with results of Pap smear by phone. Discussed smoking cessation with patient. Referred patient to the [facility] Quitline and gave resources to free smoking cessation classes offered at [facility]. Connie Herrera verbalized understanding.  Marketia Stallsmith, Arvil Chaco, RN 9:58 AM

## 2015-11-28 NOTE — Addendum Note (Signed)
Encounter addended by: Loletta Parish, RN on: 11/28/2015  3:09 PM<BR>    Actions taken: Visit diagnoses modified

## 2015-11-28 NOTE — Progress Notes (Signed)
Complaints of left breast lump x 1 month that is painful. Patient states the pain comes and goes. Patient states the pain is worse when she lays on her left breast. Patient rates pain at a 8 out of 10. Patient complained of redness and itching within the left breast.  Pap Smear: Pap smear completed today. Last Pap smear was 10/11/2012 in [facility] and normal. Per patient has no history of an abnormal Pap smear. Last Pap smear result is in EPIC.  Physical exam: Breasts Breasts symmetrical. No skin abnormalities bilateral breasts. No nipple retraction bilateral breasts. No nipple discharge bilateral breasts. No lymphadenopathy. No lumps palpated right breast. Palpated a lump within the left breast at 3 o'clock 3 cm from the nipple. Complaints of left breast tenderness on exam. Patient complained of some mild tenderness when palpated right breast on exam. Referred patient to the [facility] for a bilateral diagnostic mammogram and ultrasound. Appointment scheduled for Thursday, November 28, 2015 at 0930.  Pelvic/Bimanual   Ext Genitalia No lesions, no swelling and no discharge observed on external genitalia.         Vagina Vagina pink and normal texture. No lesions or discharge observed in vagina.          Cervix Cervix is present. Cervix pink and of normal texture. No discharge observed.     Uterus Uterus is present and palpable. Uterus in normal position and normal size.        Adnexae Bilateral ovaries present and palpable. No tenderness on palpation.          Rectovaginal No rectal exam completed today since patient had no rectal complaints. No skin abnormalities observed on exam.    Smoking History: Patient is a current smoker. Discussed smoking cessation with patient. Referred patient to the [facility] Quitline and gave resources to free smoking cessation classes offered at [facility].  Patient Navigation: Patient education provided. Access to services provided for patient  through [facility] program.

## 2015-11-29 LAB — CYTOLOGY - PAP

## 2015-12-02 ENCOUNTER — Encounter (HOSPITAL_COMMUNITY): Payer: Self-pay | Admitting: *Deleted

## 2015-12-05 ENCOUNTER — Ambulatory Visit: Payer: No Typology Code available for payment source

## 2015-12-10 ENCOUNTER — Telehealth (HOSPITAL_COMMUNITY): Payer: Self-pay | Admitting: *Deleted

## 2015-12-10 NOTE — Telephone Encounter (Signed)
Telephoned patient at home number and voicemail box not set up.

## 2015-12-12 ENCOUNTER — Ambulatory Visit (HOSPITAL_COMMUNITY): Payer: Medicaid Other

## 2016-01-01 ENCOUNTER — Telehealth (HOSPITAL_COMMUNITY): Payer: Self-pay | Admitting: *Deleted

## 2016-01-01 NOTE — Telephone Encounter (Signed)
Patient returned call to [facility]. Advised patient of negative pap smear results and negative HPV. Next pap smear due in five years. Patient wanted to know what she is suppose to do about lump in breast. Advised patient results from mammogram showed negative results and follow up one year. Patient stated the lump was huge but no active problems. Advised patient if anything changed then would need to follow up with the [facility]. Patient voiced understanding,

## 2016-01-05 ENCOUNTER — Inpatient Hospital Stay (HOSPITAL_COMMUNITY)
Admission: AD | Admit: 2016-01-05 | Discharge: 2016-01-05 | Disposition: A | Discharge status: Home / Self Care | Payer: No Typology Code available for payment source | Source: Ambulatory Visit | Attending: Family Medicine | Admitting: Family Medicine

## 2016-01-05 ENCOUNTER — Encounter (HOSPITAL_COMMUNITY): Payer: Self-pay | Admitting: *Deleted

## 2016-01-05 DIAGNOSIS — B9689 Other specified bacterial agents as the cause of diseases classified elsewhere: Secondary | ICD-10-CM

## 2016-01-05 DIAGNOSIS — F17210 Nicotine dependence, cigarettes, uncomplicated: Secondary | ICD-10-CM | POA: Insufficient documentation

## 2016-01-05 DIAGNOSIS — N760 Acute vaginitis: Secondary | ICD-10-CM | POA: Insufficient documentation

## 2016-01-05 DIAGNOSIS — A5901 Trichomonal vulvovaginitis: Secondary | ICD-10-CM

## 2016-01-05 DIAGNOSIS — A599 Trichomoniasis, unspecified: Secondary | ICD-10-CM

## 2016-01-05 LAB — WET PREP, GENITAL
Sperm: NONE SEEN
Yeast Wet Prep HPF POC: NONE SEEN

## 2016-01-05 LAB — URINALYSIS, ROUTINE W REFLEX MICROSCOPIC
BILIRUBIN URINE: NEGATIVE
Glucose, UA: NEGATIVE mg/dL
KETONES UR: NEGATIVE mg/dL
Nitrite: NEGATIVE
PROTEIN: NEGATIVE mg/dL
Specific Gravity, Urine: 1.025 (ref 1.005–1.030)
pH: 6 (ref 5.0–8.0)

## 2016-01-05 LAB — URINE MICROSCOPIC-ADD ON

## 2016-01-05 LAB — POCT PREGNANCY, URINE: PREG TEST UR: NEGATIVE

## 2016-01-05 MED ORDER — METRONIDAZOLE IN NACL 5-0.79 MG/ML-% IV SOLN: 500.0000 mg | Freq: Once | INTRAVENOUS | Status: DC

## 2016-01-05 MED ORDER — METRONIDAZOLE 500 MG PO TABS: 500.0000 mg | ORAL_TABLET | Freq: Two times a day (BID) | ORAL | 0 refills | Status: AC

## 2016-01-05 MED ORDER — METRONIDAZOLE 500 MG PO TABS
500.0000 mg | ORAL_TABLET | Freq: Once | ORAL | Status: AC
  Administered 2016-01-05: 500 mg via ORAL
  Filled 2016-01-05: qty 1

## 2016-01-05 NOTE — Progress Notes (Signed)
Dr Baron Sane in earlier to discuss test results and d/c plan. Written and verbal d/c instructions given and understanding voiced.

## 2016-01-05 NOTE — MAU Note (Addendum)
Lower abd pain for 2 days. WHite vag d/c with some odor. Pain in L breast but had mammogram 2 wks ago and was told ok. Have benign cyst L breast. Here this am for abd pain. Think may have uti

## 2016-01-05 NOTE — Discharge Instructions (Signed)
Trichomoniasis Trichomoniasis is an infection caused by an organism called Trichomonas. The infection can affect both women and men. In women, the outer female genitalia and the vagina are affected. In men, the penis is mainly affected, but the prostate and other reproductive organs can also be involved. Trichomoniasis is a sexually transmitted infection (STI) and is most often passed to another person through sexual contact.  RISK FACTORS  Having unprotected sexual intercourse.  Having sexual intercourse with an infected partner. SIGNS AND SYMPTOMS  Symptoms of trichomoniasis in women include:  Abnormal gray-green frothy vaginal discharge.  Itching and irritation of the vagina.  Itching and irritation of the area outside the vagina. Symptoms of trichomoniasis in men include:   Penile discharge with or without pain.  Pain during urination. This results from inflammation of the urethra. DIAGNOSIS  Trichomoniasis may be found during a Pap test or physical exam. Your health care provider may use one of the following methods to help diagnose this infection:  Testing the pH of the vagina with a test tape.  Using a vaginal swab test that checks for the Trichomonas organism. A test is available that provides results within a few minutes.  Examining a urine sample.  Testing vaginal secretions. Your health care provider may test you for other STIs, including HIV. TREATMENT   You may be given medicine to fight the infection. Women should inform their health care provider if they could be or are pregnant. Some medicines used to treat the infection should not be taken during pregnancy.  Your health care provider may recommend over-the-counter medicines or creams to decrease itching or irritation.  Your sexual partner will need to be treated if infected.  Your health care provider may test you for infection again 3 months after treatment. HOME CARE INSTRUCTIONS   Take medicines only as  directed by your health care provider.  Take over-the-counter medicine for itching or irritation as directed by your health care provider.  Do not have sexual intercourse while you have the infection.  Women should not douche or wear tampons while they have the infection.  Discuss your infection with your partner. Your partner may have gotten the infection from you, or you may have gotten it from your partner.  Have your sex partner get examined and treated if necessary.  Practice safe, informed, and protected sex.  See your health care provider for other STI testing. SEEK MEDICAL CARE IF:   You still have symptoms after you finish your medicine.  You develop abdominal pain.  You have pain when you urinate.  You have bleeding after sexual intercourse.  You develop a rash.  Your medicine makes you sick or makes you throw up (vomit). MAKE SURE YOU:  Understand these instructions.  Will watch your condition.  Will get help right away if you are not doing well or get worse.   This information is not intended to replace advice given to you by your health care provider. Make sure you discuss any questions you have with your health care provider.   Document Released: 09/16/2000 Document Revised: 04/13/2014 Document Reviewed: 01/02/2013 Elsevier Interactive Patient Education 2016 Elsevier Inc.  

## 2016-01-05 NOTE — MAU Provider Note (Signed)
History     CSN: [id number]  Arrival date and time: 01/05/16 [id number]   First Provider Initiated Contact with Patient 01/05/16 [phone]      Chief Complaint  Patient presents with  . Breast Pain  . Abdominal Pain   Abdominal Pain  This is a new problem. The current episode started in the past 7 days. The onset quality is sudden. The problem occurs constantly. The most recent episode lasted 3 days. The problem has been gradually worsening. The pain is located in the suprapubic region. The pain is at a severity of 7/10. The pain is moderate. The quality of the pain is sharp. The abdominal pain does not radiate. Associated symptoms include dysuria. Pertinent negatives include no anorexia, arthralgias, constipation, diarrhea, fever, frequency, headaches, hematuria, nausea or vomiting. The pain is aggravated by certain positions and movement. The pain is relieved by nothing. She has tried nothing for the symptoms.  Vaginal Discharge  The patient's primary symptoms include genital itching, a genital odor, pelvic pain and vaginal discharge. The patient's pertinent negatives include no genital lesions, genital rash, missed menses or vaginal bleeding. This is a new problem. The current episode started in the past 7 days. The problem occurs constantly. The problem has been gradually worsening. The pain is mild. The problem affects both sides. She is not pregnant. Associated symptoms include abdominal pain and dysuria. Pertinent negatives include no anorexia, chills, constipation, diarrhea, fever, frequency, headaches, hematuria, nausea, rash or vomiting. The vaginal discharge was copious, milky and yellow. The symptoms are aggravated by urinating. She has tried nothing for the symptoms. She is sexually active. No, her partner does not have an STD. She uses nothing for contraception.    OB History    Gravida Para Term Preterm AB Living   4 4 4     4    SAB TAB Ectopic Multiple Live Births           4       Past Medical History:  Diagnosis Date  . Brain lesion   . Hypertension     Past Surgical History:  Procedure Laterality Date  . TUBAL LIGATION    . TUBAL LIGATION      Family History  Problem Relation Age of Onset  . Cancer Maternal Grandmother 43    breast    Social History  Substance Use Topics  . Smoking status: Current Every Day Smoker    Packs/day: 0.50    Years: 4.00    Types: Cigarettes  . Smokeless tobacco: Current User  . Alcohol use No    Allergies:  Allergies  Allergen Reactions  . Hydrocodone Nausea And Vomiting    Patient states that she can take medication it just makes her have bad nausea    Prescriptions Prior to Admission  Medication Sig Dispense Refill Last Dose  . acyclovir (ZOVIRAX) 200 MG capsule TAKE ONE CAPSULE BY MOUTH AS DIRECTED FOUR CAPSULES TWICE DAILY FOR 5 DAYS FOR OUTBREAKS,  2 CAPSULES  TWICE DAILY  FOR  SUPPRESSION 30 capsule 6 01/04/2016 at Unknown time  . ibuprofen (ADVIL,MOTRIN) 800 MG tablet Take 1 tablet (800 mg total) by mouth every 8 (eight) hours as needed. 60 tablet 1 01/04/2016 at Unknown time  . hydrochlorothiazide (HYDRODIURIL) 12.5 MG tablet Take 1 tablet (12.5 mg total) by mouth daily. (Patient not taking: Reported on 11/27/2015) 30 tablet 0 Not Taking  . ibuprofen (ADVIL,MOTRIN) 200 MG tablet Take 800 mg by mouth every 6 (six) hours as needed for  mild pain.    Not Taking    Review of Systems  Constitutional: Negative for chills and fever.  HENT: Negative for congestion.   Respiratory: Negative for cough and shortness of breath.   Cardiovascular: Negative for chest pain and palpitations.  Gastrointestinal: Positive for abdominal pain. Negative for anorexia, constipation, diarrhea, nausea and vomiting.  Genitourinary: Positive for dysuria, pelvic pain and vaginal discharge. Negative for frequency, hematuria and missed menses.  Musculoskeletal: Negative for arthralgias.  Skin: Negative for itching and rash.   Neurological: Negative for headaches.   Physical Exam   Blood pressure 145/88, pulse 86, temperature 98.1 F (36.7 C), resp. rate 18, height 5\' 7"  (1.702 m), weight 191 lb (86.6 kg), last menstrual period 12/30/2015.  Physical Exam  Constitutional: She is oriented to person, place, and time. She appears well-developed and well-nourished.  HENT:  Head: Normocephalic and atraumatic.  Cardiovascular: Normal rate and intact distal pulses.   Respiratory: Effort normal and breath sounds normal. No respiratory distress.  GI: Soft. Bowel sounds are normal. She exhibits no distension. There is no rebound and no guarding.  Positive suprapubic tenderness  Genitourinary:  Genitourinary Comments: Vagina with copious mucopurulent yellowish in discharge. No bleeding no cervical motion tenderness  Neurological: She is alert and oriented to person, place, and time. No cranial nerve deficit.  Skin: Skin is warm and dry.  Psychiatric: She has a normal mood and affect. Her behavior is normal.    MAU Course  Procedures  MDM In MAU patient underwent testing with urinalysis wet prep and gonorrhea and chlamydia. Patient's wet prep came back for Trichomonas. Additionally patient's wet prep was positive for clue cells and bacterial vaginosis. Urinalysis showed some white blood cells and blood with mild bacteria. It was sent for culture.  Patient was given her first dose of Flagyl in the emergency department this evening.  Patient's blood pressure was noted to be elevated throughout admission. Patient is working on getting access to a free clinic or establishing insurance so she can be treated. Assessment and Plan  #1: Trichomonas. Treat with Flagyl twice a day 7 days advised partners get treated. 2: Bacterial vaginosis treated with Flagyl twice a day for 7 days  Jacquiline Doe 01/05/2016, 1:47 AM

## 2016-01-06 LAB — URINE CULTURE

## 2016-01-06 LAB — GC/CHLAMYDIA PROBE AMP (~~LOC~~) NOT AT ARMC
CHLAMYDIA, DNA PROBE: NEGATIVE
Neisseria Gonorrhea: NEGATIVE

## 2016-05-20 ENCOUNTER — Other Ambulatory Visit: Payer: Self-pay | Admitting: Advanced Practice Midwife

## 2016-07-23 ENCOUNTER — Other Ambulatory Visit: Payer: Self-pay | Admitting: Obstetrics and Gynecology

## 2016-10-04 ENCOUNTER — Ambulatory Visit (HOSPITAL_COMMUNITY)
Admission: EM | Admit: 2016-10-04 | Discharge: 2016-10-04 | Disposition: A | Discharge status: Home / Self Care | Payer: No Typology Code available for payment source | Attending: Family Medicine | Admitting: Family Medicine

## 2016-10-04 ENCOUNTER — Encounter (HOSPITAL_COMMUNITY): Payer: Self-pay | Admitting: Emergency Medicine

## 2016-10-04 DIAGNOSIS — R112 Nausea with vomiting, unspecified: Secondary | ICD-10-CM

## 2016-10-04 DIAGNOSIS — R51 Headache: Secondary | ICD-10-CM

## 2016-10-04 DIAGNOSIS — I10 Essential (primary) hypertension: Secondary | ICD-10-CM

## 2016-10-04 DIAGNOSIS — K2900 Acute gastritis without bleeding: Secondary | ICD-10-CM

## 2016-10-04 MED ORDER — KETOROLAC TROMETHAMINE 60 MG/2ML IM SOLN
INTRAMUSCULAR | Status: AC
  Filled 2016-10-04: qty 2

## 2016-10-04 MED ORDER — ONDANSETRON 4 MG PO TBDP
4.0000 mg | ORAL_TABLET | Freq: Once | ORAL | Status: AC
  Administered 2016-10-04: 4 mg via ORAL

## 2016-10-04 MED ORDER — AMLODIPINE BESYLATE 5 MG PO TABS: 5.0000 mg | ORAL_TABLET | Freq: Every day | ORAL | 0 refills | Status: DC

## 2016-10-04 MED ORDER — KETOROLAC TROMETHAMINE 60 MG/2ML IM SOLN
60.0000 mg | Freq: Once | INTRAMUSCULAR | Status: AC
  Administered 2016-10-04: 60 mg via INTRAMUSCULAR

## 2016-10-04 MED ORDER — ONDANSETRON HCL 4 MG PO TABS: 4.0000 mg | ORAL_TABLET | Freq: Four times a day (QID) | ORAL | 0 refills | Status: DC

## 2016-10-04 MED ORDER — ONDANSETRON 4 MG PO TBDP
ORAL_TABLET | ORAL | Status: AC
  Filled 2016-10-04: qty 1

## 2016-10-04 NOTE — ED Provider Notes (Signed)
[place]    CSN: [id number] Arrival date & time: 10/04/16  1200     History   Chief Complaint Chief Complaint  Patient presents with  . Emesis  . Hypertension    HPI Connie Herrera is a 41 y.o. female.   HPI   Hypertension Patient presents for hypertension issue. She recently tried to go off of lisinopril and control BP with diet. Patient was tolerating medicine well.  She has had a tubal ligation. She has been having associated HA and nausea.  She denies vision changes, numbness, tingling, weakness, CP, or SOB.  Her nausea, HA, and vomiting started this AM. She is having difficulty keeping things down. She denies any fevers, secundines, recent travel, abdominal pain, diarrhea, constipation, bleeding, or new foods tried.  Past Medical History:  Diagnosis Date  . Brain lesion   . Hypertension     Patient Active Problem List   Diagnosis Date Noted  . Paresthesias 08/04/2013  . Breast pain, right 03/11/2012    Past Surgical History:  Procedure Laterality Date  . TUBAL LIGATION    . TUBAL LIGATION      OB History    Gravida Para Term Preterm AB Living   4 4 4     4    SAB TAB Ectopic Multiple Live Births           4       Home Medications    Prior to Admission medications   Medication Sig Start Date End Date Taking? Authorizing Provider  acyclovir (ZOVIRAX) 200 MG capsule TAKE ONE CAPSULE BY MOUTH AS DIRECTED , TAKE 4 CAPSULE TWICE DAILY FOR 5 DAYS , FOR OUTBREAK , 2 CAPSULES TWICE DAILY FOR SUPPERSSION 05/20/16   Tamala Julian, [place], CNM  amLODipine (NORVASC) 5 MG tablet Take 1 tablet (5 mg total) by mouth daily. 10/04/16   Shelda Pal, DO  ibuprofen (ADVIL,MOTRIN) 800 MG tablet Take 1 tablet (800 mg total) by mouth every 8 (eight) hours as needed. 11/27/15   Leftwich-Kirby, Kathie Dike, CNM  ondansetron (ZOFRAN) 4 MG tablet Take 1 tablet (4 mg total) by mouth every 6 (six) hours. 10/04/16   Shelda Pal, DO    Family  History Family History  Problem Relation Age of Onset  . Cancer Maternal Grandmother 21       breast    Social History Social History  Substance Use Topics  . Smoking status: Current Every Day Smoker    Packs/day: 0.50    Years: 4.00    Types: Cigarettes  . Smokeless tobacco: Current User  . Alcohol use No     Allergies   Hydrocodone   Review of Systems Review of Systems  Gastrointestinal: Positive for nausea.  Neurological: Positive for headaches.     Physical Exam Triage Vital Signs ED Triage Vitals  Enc Vitals Group     BP 10/04/16 1212 (!) 170/99     Pulse Rate 10/04/16 1212 71     Resp 10/04/16 1212 16     Temp 10/04/16 1212 98.5 F (36.9 C)     Temp Source 10/04/16 1212 Oral     SpO2 10/04/16 1212 98 %     Weight 10/04/16 1213 181 lb (82.1 kg)     Height 10/04/16 1213 5\' 8"  (1.727 m)   Updated Vital Signs BP (!) 170/99   Pulse 71   Temp 98.5 F (36.9 C) (Oral)   Resp 16   Ht 5\' 8"  (1.727 m)  Wt 181 lb (82.1 kg)   SpO2 98%   BMI 27.52 kg/m   Physical Exam  Constitutional: She is oriented to person, place, and time. She appears well-developed and well-nourished.  HENT:  Head: Normocephalic and atraumatic.  Mouth/Throat: Oropharynx is clear and moist.  Eyes: EOM are normal. Pupils are equal, round, and reactive to light.  Neck: Normal range of motion. Neck supple.  Cardiovascular: Normal rate and regular rhythm.   No murmur heard. Pulmonary/Chest: Effort normal and breath sounds normal. No respiratory distress.  Abdominal: Soft. Bowel sounds are normal. She exhibits no distension and no mass. There is tenderness. There is no guarding.  Negative Carnett's, McBurney's, Rovsing's, Murphy's sign  Neurological: She is alert and oriented to person, place, and time. She displays normal reflexes. No cranial nerve deficit. Coordination normal.  Skin: Skin is warm and dry. She is not diaphoretic.  Psychiatric: Judgment normal.  Flat affect     UC  Treatments / Results  Procedures Procedures - none  Initial Impression / Assessment and Plan / UC Course  I have reviewed the triage vital signs and the nursing notes.  Pertinent labs & imaging results that were available during my care of the patient were reviewed by me and considered in my medical decision making (see chart for details).     Pt presents with uncontrolled HTN after failing lifestyle modifications in place of medication management. EKG is normal showing a normal sinus rhythm, normal axis, no interval abnormalities, no T-wave inversions, no ST segment changes, or other signs of ischemia. Her chest pain is likely secondary to vomiting, which she agrees with. We'll give him Toradol today for her headache. Stay well-hydrated with electrolyte containing solutions. She is to set up with a PCP this week. She has had a tubal ligation so I am OK with ordering Norvasc as we do not have to monitor labs with it like ACEi/ARBs or diuretics. I do not think she is having an acute coronary event or PE. I do not think she has appendicitis. She is discharged in stable condition. The pt voiced understanding and agreement to the plan.  Final Clinical Impressions(s) / UC Diagnoses   Final diagnoses:  Acute gastritis without hemorrhage, unspecified gastritis type    New Prescriptions New Prescriptions   AMLODIPINE (NORVASC) 5 MG TABLET    Take 1 tablet (5 mg total) by mouth daily.   ONDANSETRON (ZOFRAN) 4 MG TABLET    Take 1 tablet (4 mg total) by mouth every 6 (six) hours.     Shelda Pal, DO 10/04/16 1323

## 2016-10-04 NOTE — ED Triage Notes (Signed)
PT reports she woke up with severe nausea and headache. History of hypertension. Quit taking lisinopril 3 months ago and has been trying to control with diet

## 2016-10-04 NOTE — Discharge Instructions (Signed)
Try to keep drinking fluids like Pedialyte, Powerade, Gatorade, or something with electrolytes in it. Drink these as long as you continue to vomit.  Your EKG looks normal today.  If you start having chest pain associated with physical exertion rather than vomiting, seek care.  Do not take any ibuprofen for the rest of the day.  OK to take Tylenol 1000 mg (2 extra strength tabs) or 975 mg (3 regular strength tabs) every 6 hours as needed.  Make sure you follow up with a PCP regarding your blood pressure.

## 2016-12-27 ENCOUNTER — Ambulatory Visit (INDEPENDENT_AMBULATORY_CARE_PROVIDER_SITE_OTHER): Payer: Self-pay

## 2016-12-27 ENCOUNTER — Ambulatory Visit (HOSPITAL_COMMUNITY)
Admission: EM | Admit: 2016-12-27 | Discharge: 2016-12-27 | Disposition: A | Discharge status: Home / Self Care | Payer: Self-pay | Attending: Urgent Care | Admitting: Urgent Care

## 2016-12-27 ENCOUNTER — Encounter (HOSPITAL_COMMUNITY): Payer: Self-pay | Admitting: Emergency Medicine

## 2016-12-27 DIAGNOSIS — R059 Cough, unspecified: Secondary | ICD-10-CM

## 2016-12-27 DIAGNOSIS — R0789 Other chest pain: Secondary | ICD-10-CM

## 2016-12-27 DIAGNOSIS — R05 Cough: Secondary | ICD-10-CM | POA: Insufficient documentation

## 2016-12-27 DIAGNOSIS — R350 Frequency of micturition: Secondary | ICD-10-CM | POA: Insufficient documentation

## 2016-12-27 DIAGNOSIS — M545 Low back pain: Secondary | ICD-10-CM | POA: Insufficient documentation

## 2016-12-27 DIAGNOSIS — N3090 Cystitis, unspecified without hematuria: Secondary | ICD-10-CM

## 2016-12-27 LAB — POCT URINALYSIS DIP (DEVICE)
GLUCOSE, UA: 100 mg/dL — AB
KETONES UR: 15 mg/dL — AB
Nitrite: POSITIVE — AB
SPECIFIC GRAVITY, URINE: 1.025 (ref 1.005–1.030)
Urobilinogen, UA: 4 mg/dL — ABNORMAL HIGH (ref 0.0–1.0)
pH: 6 (ref 5.0–8.0)

## 2016-12-27 MED ORDER — NAPROXEN 500 MG PO TABS: 500.0000 mg | ORAL_TABLET | Freq: Two times a day (BID) | ORAL | 0 refills | Status: DC

## 2016-12-27 MED ORDER — NITROFURANTOIN MONOHYD MACRO 100 MG PO CAPS: 100.0000 mg | ORAL_CAPSULE | Freq: Two times a day (BID) | ORAL | 0 refills | Status: DC

## 2016-12-27 MED ORDER — TRAMADOL-ACETAMINOPHEN 37.5-325 MG PO TABS: 1.0000 | ORAL_TABLET | Freq: Four times a day (QID) | ORAL | 0 refills | Status: DC | PRN

## 2016-12-27 NOTE — ED Triage Notes (Addendum)
Cough, right chest pain, right chest hurts worse with coughing that started 3 days ago.    Patient also has concerns for a uti

## 2016-12-27 NOTE — ED Provider Notes (Addendum)
MRN: [id number] DOB: 04/04/1976  Subjective:   Connie Herrera is a 41 y.o. female presenting for chief complaint of Cough  Reports 2 day history of right sided chest pain, shob, wheezing, mild productive cough, mild sinus congestion. Admits history of this type of chest wall pain, states that she was previously told she had a fracture of her sternum. She also reports having moved, lifted heavy boxes and heavy items in general ~1 week ago. Has tried 800mg  ibuprofen without any relief. Also has lower belly pain, urinary frequency, low back pain. Denies fever, n/v, sinus pain, ear pain, sore throat, genital rashes, vaginal discharge. Patient is currently on her cycle. Does not hydrate well. Smokes 1/2ppd.  Connie Herrera is not currently taking any medications and is allergic to hydrocodone.  Connie Herrera  has a past medical history of Brain lesion and Hypertension. Also  has a past surgical history that includes Tubal ligation and Tubal ligation.  Objective:   Vitals: BP (!) 153/92 (BP Location: Left Arm)   Pulse 82   Temp 98.2 F (36.8 C) (Oral)   Resp 18   SpO2 99%   Physical Exam  Constitutional: She is oriented to person, place, and time. She appears well-developed and well-nourished.  HENT:  Mouth/Throat: Oropharynx is clear and moist.  Eyes: No scleral icterus.  Cardiovascular: Normal rate, regular rhythm and intact distal pulses.  Exam reveals no gallop and no friction rub.   No murmur heard. Pulmonary/Chest: No respiratory distress. She has no wheezes. She has no rales. She exhibits tenderness (mid-right sided with light palpation).  Abdominal: Soft. Bowel sounds are normal. She exhibits no distension and no mass. There is no tenderness. There is no guarding.  No CVA tenderness.  Neurological: She is alert and oriented to person, place, and time.  Skin: Skin is warm and dry.   Results for orders placed or performed during the hospital encounter of 12/27/16 (from the past 24 hour(s))   POCT urinalysis dip (device)     Status: Abnormal   Collection Time: 12/27/16  6:31 PM  Result Value Ref Range   Glucose, UA 100 (A) NEGATIVE mg/dL   Bilirubin Urine MODERATE (A) NEGATIVE   Ketones, ur 15 (A) NEGATIVE mg/dL   Specific Gravity, Urine 1.025 1.005 - 1.030   Hgb urine dipstick LARGE (A) NEGATIVE   pH 6.0 5.0 - 8.0   Protein, ur >=300 (A) NEGATIVE mg/dL   Urobilinogen, UA 4.0 (H) 0.0 - 1.0 mg/dL   Nitrite POSITIVE (A) NEGATIVE   Leukocytes, UA LARGE (A) NEGATIVE   Assessment and Plan :   Cystitis  Atypical chest pain  Cough   Start Macrobid for cystitis. Advised aggressive hydration. Will manage chest wall pain as costochondritis with APAP d/t her current cystitis and intolerance to opioids. Radiology over read is pending.  Connie Herrera, [place] [facility]  12/27/2016  6:08 PM    UPDATE:  Dg Chest 2 View  Result Date: 12/27/2016 CLINICAL DATA:  Cough, right anterior chest pain (pressure pain), right chest hurts worse with coughing that started 3 days ago, productive cough. Pain radiates down rt arm. No known lung conditions, no known heart conditions, smoker 1 pack a day, nondiabetic. EXAM: CHEST  2 VIEW COMPARISON:  06/11/2008 FINDINGS: The heart size and mediastinal contours are within normal limits. Both lungs are clear. No pleural effusion or pneumothorax. The visualized skeletal structures are unremarkable. IMPRESSION: Normal chest radiographs. Electronically Signed   By: Connie Herrera M.D.   On:  12/27/2016 19:20      Connie Eagles, PA-C 12/27/16 1923

## 2016-12-29 LAB — URINE CULTURE

## 2017-01-27 ENCOUNTER — Other Ambulatory Visit: Payer: Self-pay | Admitting: Advanced Practice Midwife

## 2017-05-12 ENCOUNTER — Encounter (HOSPITAL_COMMUNITY): Payer: Self-pay

## 2017-05-12 ENCOUNTER — Inpatient Hospital Stay (HOSPITAL_COMMUNITY)
Admission: AD | Admit: 2017-05-12 | Discharge: 2017-05-13 | Disposition: A | Discharge status: Home / Self Care | Payer: Self-pay | Source: Ambulatory Visit | Attending: Obstetrics and Gynecology | Admitting: Obstetrics and Gynecology

## 2017-05-12 DIAGNOSIS — Z79899 Other long term (current) drug therapy: Secondary | ICD-10-CM | POA: Insufficient documentation

## 2017-05-12 DIAGNOSIS — R102 Pelvic and perineal pain: Secondary | ICD-10-CM

## 2017-05-12 DIAGNOSIS — Z885 Allergy status to narcotic agent status: Secondary | ICD-10-CM | POA: Insufficient documentation

## 2017-05-12 DIAGNOSIS — F17210 Nicotine dependence, cigarettes, uncomplicated: Secondary | ICD-10-CM | POA: Insufficient documentation

## 2017-05-12 DIAGNOSIS — I10 Essential (primary) hypertension: Secondary | ICD-10-CM | POA: Insufficient documentation

## 2017-05-12 DIAGNOSIS — A5901 Trichomonal vulvovaginitis: Secondary | ICD-10-CM | POA: Insufficient documentation

## 2017-05-12 LAB — URINALYSIS, ROUTINE W REFLEX MICROSCOPIC
BACTERIA UA: NONE SEEN
GLUCOSE, UA: NEGATIVE mg/dL
HGB URINE DIPSTICK: NEGATIVE
Ketones, ur: 5 mg/dL — AB
Nitrite: NEGATIVE
PROTEIN: 30 mg/dL — AB
Specific Gravity, Urine: 1.028 (ref 1.005–1.030)
pH: 5 (ref 5.0–8.0)

## 2017-05-12 LAB — POCT PREGNANCY, URINE: Preg Test, Ur: NEGATIVE

## 2017-05-12 NOTE — MAU Note (Signed)
Pt here with c/o cramping and odorous discharge.

## 2017-05-13 ENCOUNTER — Encounter (HOSPITAL_COMMUNITY): Payer: Self-pay

## 2017-05-13 DIAGNOSIS — A5901 Trichomonal vulvovaginitis: Secondary | ICD-10-CM

## 2017-05-13 LAB — GC/CHLAMYDIA PROBE AMP (~~LOC~~) NOT AT ARMC
Chlamydia: NEGATIVE
NEISSERIA GONORRHEA: NEGATIVE

## 2017-05-13 LAB — WET PREP, GENITAL
Clue Cells Wet Prep HPF POC: NONE SEEN
Sperm: NONE SEEN
YEAST WET PREP: NONE SEEN

## 2017-05-13 MED ORDER — IBUPROFEN 600 MG PO TABS
600.0000 mg | ORAL_TABLET | Freq: Once | ORAL | Status: AC
  Administered 2017-05-13: 600 mg via ORAL
  Filled 2017-05-13: qty 1

## 2017-05-13 MED ORDER — METRONIDAZOLE 500 MG PO TABS
2000.0000 mg | ORAL_TABLET | Freq: Once | ORAL | Status: AC
Start: 2017-05-13 — End: 2017-05-13
  Administered 2017-05-13: 2000 mg via ORAL
  Filled 2017-05-13: qty 4

## 2017-05-13 NOTE — Discharge Instructions (Signed)
Trichomoniasis °Trichomoniasis is an STI (sexually transmitted infection) that can affect both women and men. In women, the outer area of the female genitalia (vulva) and the vagina are affected. In men, the penis is mainly affected, but the prostate and other reproductive organs can also be involved. This condition can be treated with medicine. It often has no symptoms (is asymptomatic), especially in men. °What are the causes? °This condition is caused by an organism called Trichomonas vaginalis. Trichomoniasis most often spreads from person to person (is contagious) through sexual contact. °What increases the risk? °The following factors may make you more likely to develop this condition: °· Having unprotected sexual intercourse. °· Having sexual intercourse with a partner who has trichomoniasis. °· Having multiple sexual partners. °· Having had previous trichomoniasis infections or other STIs. ° °What are the signs or symptoms? °In women, symptoms of trichomoniasis include: °· Abnormal vaginal discharge that is clear, white, gray, or yellow-green and foamy and has an unusual "fishy" odor. °· Itching and irritation of the vagina and vulva. °· Burning or pain during urination or sexual intercourse. °· Genital redness and swelling. ° °In men, symptoms of trichomoniasis include: °· Penile discharge that may be foamy or contain pus. °· Pain in the penis. This may happen only when urinating. °· Itching or irritation inside the penis. °· Burning after urination or ejaculation. ° °How is this diagnosed? °In women, this condition may be found during a routine Pap test or physical exam. It may be found in men during a routine physical exam. Your health care provider may perform tests to help diagnose this infection, such as: °· Urine tests (men and women). °· The following in women: °? Testing the pH of the vagina. °? A vaginal swab test that checks for the Trichomonas vaginalis organism. °? Testing vaginal  secretions. ° °Your health care provider may test you for other STIs, including HIV (human immunodeficiency virus). °How is this treated? °This condition is treated with medicine taken by mouth (orally), such as metronidazole or tinidazole to fight the infection. Your sexual partner(s) may also need to be tested and treated. °· If you are a woman and you plan to become pregnant or think you may be pregnant, tell your health care provider right away. Some medicines that are used to treat the infection should not be taken during pregnancy. ° °Your health care provider may recommend over-the-counter medicines or creams to help relieve itching or irritation. You may be tested for infection again 3 months after treatment. °Follow these instructions at home: °· Take and use over-the-counter and prescription medicines, including creams, only as told by your health care provider. °· Do not have sexual intercourse until one week after you finish your medicine, or until your health care provider approves. Ask your health care provider when you may resume sexual intercourse. °· (Women) Do not douche or wear tampons while you have the infection. °· Discuss your infection with your sexual partner(s). Make sure that your partner gets tested and treated, if necessary. °· Keep all follow-up visits as told by your health care provider. This is important. °How is this prevented? °· Use condoms every time you have sex. Using condoms correctly and consistently can help protect against STIs. °· Avoid having multiple sexual partners. °· Talk with your sexual partner about any symptoms that either of you may have, as well as any history of STIs. °· Get tested for STIs and STDs (sexually transmitted diseases) before you have sex. Ask your partner   to do the same.  Do not have sexual contact if you have symptoms of trichomoniasis or another STI. Contact a health care provider if:  You still have symptoms after you finish your  medicine.  You develop pain in your abdomen.  You have pain when you urinate.  You have bleeding after sexual intercourse.  You develop a rash.  You feel nauseous or you vomit.  You plan to become pregnant or think you may be pregnant. Summary  Trichomoniasis is an STI (sexually transmitted infection) that can affect both women and men.  This condition often has no symptoms (is asymptomatic), especially in men.  You should not have sexual intercourse until one week after you finish your medicine, or until your health care provider approves. Ask your health care provider when you may resume sexual intercourse.  Discuss your infection with your sexual partner. Make sure that your partner gets tested and treated, if necessary. This information is not intended to replace advice given to you by your health care provider. Make sure you discuss any questions you have with your health care provider. Document Released: 09/16/2000 Document Revised: 02/14/2016 Document Reviewed: 02/14/2016 Elsevier Interactive Patient Education  2017 [place] [employer] Practicing safe sex means taking steps before and during sex to reduce your risk of:  Getting an STD (sexually transmitted disease).  Giving your partner an STD.  Unwanted pregnancy.  How can I practice safe sex?  To practice safe sex:  Limit your sexual partners to only one partner who is having sex with only you.  Avoid using alcohol and recreational drugs before having sex. These substances can affect your judgment.  Before having sex with a new partner: ? Talk to your partner about past partners, past STDs, and drug use. ? You and your partner should be screened for STDs and discuss the results with each other.  Check your body regularly for sores, blisters, rashes, or unusual discharge. If you notice any of these problems, visit your health care provider.  If you have symptoms of an infection or you are being treated  for an STD, avoid sexual contact.  While having sex, use a condom. Make sure to: ? Use a condom every time you have vaginal, oral, or anal sex. Both females and males should wear condoms during oral sex. ? Keep condoms in place from the beginning to the end of sexual activity. ? Use a latex condom, if possible. Latex condoms offer the best protection. ? Use only water-based lubricants or oils to lubricate a condom. Using petroleum-based lubricants or oils will weaken the condom and increase the chance that it will break.  See your health care provider for regular screenings, exams, and tests for STDs.  Talk with your health care provider about the form of birth control (contraception) that is best for you.  Get vaccinated against hepatitis B and human papillomavirus (HPV).  If you are at risk of being infected with HIV (human immunodeficiency virus), talk with your health care provider about taking a prescription medicine to prevent HIV infection. You are considered at risk for HIV if: ? You are a man who has sex with other men. ? You are a heterosexual man or woman who is sexually active with more than one partner. ? You take drugs by injection. ? You are sexually active with a partner who has HIV.  This information is not intended to replace advice given to you by your health care provider. Make sure  you discuss any questions you have with your health care provider. Document Released: 04/30/2004 Document Revised: 08/07/2015 Document Reviewed: 02/10/2015 Elsevier Interactive Patient Education  2018 [employer].     Expedited Partner Therapy:  Information Sheet for Patients and Partners               You have been offered expedited partner therapy (EPT). This information sheet contains important information and warnings you need to be aware of, so please read it carefully.   Expedited Partner Therapy (EPT) is the clinical practice of treating the sexual partners of persons who  receive chlamydia, gonorrhea, or trichomoniasis diagnoses by providing medications or prescriptions to the patient. Patients then provide partners with these therapies without the health-care provider having examined the partner. In other words, EPT is a convenient, fast and private way for patients to help their sexual partners get treated.   Chlamydia and gonorrhea are bacterial infections you get from having sex with a person who is already infected. Trichomoniasis (or trich) is a very common sexually transmitted infection (STI) that is caused by infection with a protozoan parasite called Trichomonas vaginalis.  Many people with these infections dont know it because they feel fine, but without treatment these infections can cause serious health problems, such as pelvic inflammatory disease, ectopic pregnancy, infertility and increased risk of HIV.   It is important to get treated as soon as possible to protect your health, to avoid spreading these infections to others, and to prevent yourself from becoming re-infected. The good news is these infections can be easily cured with proper antibiotic medicine. The best way to take care of your self is to see a doctor or go to your local health department. If you are not able to see a doctor or other medical provider, you should take EPT.    Recommended Medication: EPT for Chlamydia:  Azithromycin (Zithromax) 1 gram orally in a single dose EPT for Gonorrhea:  Cefixime (Suprax) 400 milligrams orally in a single dose PLUS azithromycin (Zithromax) 1 gram orally in a single dose EPT for Trichomoniasis:  Metronidazole (Flagyl) 2 grams orally in a single dose   These medicines are very safe. However, you should not take them if you have ever had an allergic reaction (like a rash) to any of these medicines: azithromycin (Zithromax), erythromycin, clarithromycin (Biaxin), metronidazole (Flagyl), tinidazole (Tindimax). If you are uncertain about whether you have  an allergy, call your medical provider or pharmacist before taking this medicine. If you have a serious, long-term illness like kidney, liver or heart disease, colitis or stomach problems, or you are currently taking other prescription medication, talk to your provider before taking this medication.   Women: If you have lower belly pain, pain during sex, vomiting, or a fever, do not take this medicine. Instead, you should see a medical provider to be certain you do not have pelvic inflammatory disease (PID). PID can be serious and lead to infertility, pregnancy problems or chronic pelvic pain.   Pregnant Women: It is very important for you to see a doctor to get pregnancy services and pre-natal care. These antibiotics for EPT are safe for pregnant women, but you still need to see a medical provider as soon as possible. It is also important to note that Doxycycline is an alternative therapy for chlamydia, but it should not be taken by someone who is pregnant.   Men: If you have pain or swelling in the testicles or a fever, do not take this medicine and  see a medical provider.     Men who have sex with men (MSM): MSM in [place] continue to experience high rates of syphilis and HIV. Many MSM with gonorrhea or chlamydia could also have syphilis and/or HIV and not know it. If you are a man who has sex with other men, it is very important that you see a medical provider and are tested for HIV and syphilis. EPT is not recommended for gonorrhea for MSM.  Recommended treatment for gonorrhea for MSM is Rocephin (shot) AND azithromycin due to decreased cure rate.  Please see your medical provider if this is the case.    Along with this information sheet is a prescription for the medicine. If you receive a prescription it will be in your name and will indicate your date of birth, or it will be in the name of Expedited Partner Therapy.   In either case, you can have the prescription filled at a pharmacy. You  will be responsible for the cost of the medicine, unless you have prescription drug coverage. In that case, you could provide your name so the pharmacy could bill your health plan.   Take the medication as directed. Some people will have a mild, upset stomach, which does not last long. AVOID alcohol 24 hours after taking metronidazole (Flagyl) to reduce the possibility of a disulfiram-like reaction (severe vomiting and abdominal pain).  After taking the medicine, do not have sex for 7 days. Do not share this medicine or give it to anyone else. It is important to tell everyone you have had sex with in the last 60 days that they need to go and get tested for sexually transmitted infections.   Ways to prevent these and other sexually transmitted infections (STIs):    Abstain from sex. This is the only sure way to avoid getting an STI.   Use barrier methods, such as condoms, consistently and correctly.   Limit the number of sexual partners.   Have regular physical exams, including testing for STIs.   For more information about EPT or other issues pertaining to an STI, please contact your medical provider or the [facility] Department at [phone] or [email or web address]

## 2017-05-13 NOTE — MAU Provider Note (Signed)
Chief Complaint:  Vaginal Discharge and Abdominal Pain   First Provider Initiated Contact with Patient 05/13/17 0111     HPI: Connie Herrera is a 42 y.o. [id number] who presents to maternity admissions reporting vaginal discharge and cramping for several days.  Also has vaginal itching. States was seen in urgent care and given Macrobid for suspected UTI but that the itching has worsened since starting med.. She reports no vaginal bleeding, urinary symptoms, h/a, dizziness, n/v, or fever/chills.    Vaginal Discharge  The patient's primary symptoms include genital itching, a genital odor, pelvic pain and vaginal discharge. The patient's pertinent negatives include no genital lesions. The problem has been gradually worsening. The pain is mild. She is not pregnant. Pertinent negatives include no back pain, chills, dysuria or frequency. The vaginal discharge was milky and white. There has been no bleeding. She has not been passing clots. She has not been passing tissue. Nothing aggravates the symptoms. She has tried nothing for the symptoms. She is sexually active. It is unknown whether or not her partner has an STD.    RN Note: Pt here with c/o cramping and odorous discharge.     Past Medical History: Past Medical History:  Diagnosis Date  . Brain lesion   . Hypertension     Past obstetric history: OB History  Gravida Para Term Preterm AB Living  4 4 4     4   SAB TAB Ectopic Multiple Live Births          4    # Outcome Date GA Lbr Len/2nd Weight Sex Delivery Anes PTL Lv  4 Term      Vag-Spont   LIV  3 Term      Vag-Spont   LIV  2 Term      Vag-Spont   LIV  1 Term      Vag-Spont   LIV      Past Surgical History: Past Surgical History:  Procedure Laterality Date  . TUBAL LIGATION    . TUBAL LIGATION      Family History: Family History  Problem Relation Age of Onset  . Cancer Maternal Grandmother 4       breast    Social History: Social History   Tobacco Use  .  Smoking status: Current Every Day Smoker    Packs/day: 0.50    Years: 4.00    Pack years: 2.00    Types: Cigarettes  . Smokeless tobacco: Current User  Substance Use Topics  . Alcohol use: No  . Drug use: No    Allergies:  Allergies  Allergen Reactions  . Hydrocodone Nausea And Vomiting    Patient states that she can take medication it just makes her have bad nausea    Meds:  Medications Prior to Admission  Medication Sig Dispense Refill Last Dose  . acyclovir (ZOVIRAX) 200 MG capsule TAKE 1 CAPSULE BY MOUTH AS DIRECTED, TAKE 4 CAPSULES TWICE DAILY FOR 5 DAYS. FOR OUTBREAK, 2 CAPSULES TWICE DAILY FOR SUPPRESSION 90 capsule 2   . amLODipine (NORVASC) 5 MG tablet Take 1 tablet (5 mg total) by mouth daily. 30 tablet 0   . ibuprofen (ADVIL,MOTRIN) 800 MG tablet Take 1 tablet (800 mg total) by mouth every 8 (eight) hours as needed. 60 tablet 1 12/27/2016 at Unknown time  . naproxen (NAPROSYN) 500 MG tablet Take 1 tablet (500 mg total) by mouth 2 (two) times daily. 30 tablet 0   . nitrofurantoin, macrocrystal-monohydrate, (MACROBID) 100 MG capsule Take  1 capsule (100 mg total) by mouth 2 (two) times daily. 10 capsule 0   . ondansetron (ZOFRAN) 4 MG tablet Take 1 tablet (4 mg total) by mouth every 6 (six) hours. 12 tablet 0     I have reviewed patient's Past Medical Hx, Surgical Hx, Family Hx, Social Hx, medications and allergies.  ROS:  Review of Systems  Constitutional: Negative for chills.  Genitourinary: Positive for pelvic pain and vaginal discharge. Negative for difficulty urinating, dysuria, frequency and vaginal bleeding.  Musculoskeletal: Negative for back pain.   Other systems negative     Physical Exam   Patient Vitals for the past 24 hrs:  BP Temp Temp src Pulse Resp SpO2 Height Weight  05/12/17 2337 (!) 154/98 98.4 F (36.9 C) Oral 98 18 100 % 5\' 7"  (1.702 m) 196 lb (88.9 kg)   Constitutional: Well-developed, well-nourished female in no acute distress.   Cardiovascular: normal rate and rhythm, no ectopy audible, S1 & S2 heard, no murmur Respiratory: normal effort, no distress. Lungs CTAB with no wheezes or crackles GI: Abd soft, non-tender.  Nondistended.  No rebound, No guarding.  Bowel Sounds audible  MS: Extremities nontender, no edema, normal ROM Neurologic: Alert and oriented x 4.   Grossly nonfocal. GU: Neg CVAT. Skin:  Warm and Dry Psych:  Affect appropriate.  PELVIC EXAM: Cervix pink, visually closed, without lesion, scant white creamy discharge, vaginal walls and external genitalia normal Bimanual exam: Cervix firm, anterior, neg CMT, uterus nontender, nonenlarged, adnexa without tenderness, enlargement, or mass    Labs: Results for orders placed or performed during the hospital encounter of 05/12/17 (from the past 24 hour(s))  Urinalysis, Routine w reflex microscopic     Status: Abnormal   Collection Time: 05/12/17 11:40 PM  Result Value Ref Range   Color, Urine AMBER (A) YELLOW   APPearance CLOUDY (A) CLEAR   Specific Gravity, Urine 1.028 1.005 - 1.030   pH 5.0 5.0 - 8.0   Glucose, UA NEGATIVE NEGATIVE mg/dL   Hgb urine dipstick NEGATIVE NEGATIVE   Bilirubin Urine SMALL (A) NEGATIVE   Ketones, ur 5 (A) NEGATIVE mg/dL   Protein, ur 30 (A) NEGATIVE mg/dL   Nitrite NEGATIVE NEGATIVE   Leukocytes, UA LARGE (A) NEGATIVE   RBC / HPF TOO NUMEROUS TO COUNT 0 - 5 RBC/hpf   WBC, UA TOO NUMEROUS TO COUNT 0 - 5 WBC/hpf   Bacteria, UA NONE SEEN NONE SEEN   Squamous Epithelial / LPF 6-30 (A) NONE SEEN   WBC Clumps PRESENT    Mucus PRESENT   Pregnancy, urine POC     Status: None   Collection Time: 05/12/17 11:50 PM  Result Value Ref Range   Preg Test, Ur NEGATIVE NEGATIVE  Wet prep, genital     Status: Abnormal   Collection Time: 05/13/17  1:22 AM  Result Value Ref Range   Yeast Wet Prep HPF POC NONE SEEN NONE SEEN   Trich, Wet Prep PRESENT (A) NONE SEEN   Clue Cells Wet Prep HPF POC NONE SEEN NONE SEEN   WBC, Wet Prep HPF  POC MANY (A) NONE SEEN   Sperm NONE SEEN     Imaging:  No results found.  MAU Course/MDM: I have ordered labs as follows: see above.  Cultures added Imaging ordered: none Results reviewed. Reviewed Connie Herrera is likely causing itching and crampiness.   Treatments in MAU included Flagyl 2gm po x 1, and ibuprofen for cramps Pt stable at time of discharge.  Assessment: Trichomonal  vaginitis - Plan: Discharge patient  Pelvic pain - Plan: Discharge patient   Plan: Discharge home Recommend safe sex/abstinence Has ibuprofen at home Rx for partner therapy given with instructions for partner  Encouraged to return here or to other Urgent Care/ED if she develops worsening of symptoms, increase in pain, fever, or other concerning symptoms.   Hansel Feinstein CNM, MSN Certified Nurse-Midwife 05/13/2017 1:11 AM

## 2017-07-05 ENCOUNTER — Encounter (HOSPITAL_COMMUNITY): Payer: Self-pay | Admitting: *Deleted

## 2017-07-05 ENCOUNTER — Other Ambulatory Visit: Payer: Self-pay

## 2017-07-05 ENCOUNTER — Inpatient Hospital Stay (HOSPITAL_COMMUNITY)
Admission: AD | Admit: 2017-07-05 | Discharge: 2017-07-05 | Disposition: A | Discharge status: Home / Self Care | Payer: Self-pay | Source: Ambulatory Visit | Attending: Obstetrics and Gynecology | Admitting: Obstetrics and Gynecology

## 2017-07-05 DIAGNOSIS — R1030 Lower abdominal pain, unspecified: Secondary | ICD-10-CM

## 2017-07-05 DIAGNOSIS — Z87891 Personal history of nicotine dependence: Secondary | ICD-10-CM | POA: Insufficient documentation

## 2017-07-05 DIAGNOSIS — I10 Essential (primary) hypertension: Secondary | ICD-10-CM | POA: Insufficient documentation

## 2017-07-05 DIAGNOSIS — N760 Acute vaginitis: Secondary | ICD-10-CM

## 2017-07-05 DIAGNOSIS — B9689 Other specified bacterial agents as the cause of diseases classified elsewhere: Secondary | ICD-10-CM

## 2017-07-05 DIAGNOSIS — Z79899 Other long term (current) drug therapy: Secondary | ICD-10-CM | POA: Insufficient documentation

## 2017-07-05 DIAGNOSIS — Z885 Allergy status to narcotic agent status: Secondary | ICD-10-CM | POA: Insufficient documentation

## 2017-07-05 LAB — WET PREP, GENITAL
Sperm: NONE SEEN
Trich, Wet Prep: NONE SEEN
Yeast Wet Prep HPF POC: NONE SEEN

## 2017-07-05 LAB — URINALYSIS, ROUTINE W REFLEX MICROSCOPIC
Bilirubin Urine: NEGATIVE
Glucose, UA: NEGATIVE mg/dL
Hgb urine dipstick: NEGATIVE
Ketones, ur: NEGATIVE mg/dL
Leukocytes, UA: NEGATIVE
Nitrite: NEGATIVE
Protein, ur: NEGATIVE mg/dL
Specific Gravity, Urine: 1.013 (ref 1.005–1.030)
pH: 7 (ref 5.0–8.0)

## 2017-07-05 LAB — POCT PREGNANCY, URINE: Preg Test, Ur: NEGATIVE

## 2017-07-05 MED ORDER — SECNIDAZOLE 2 G PO PACK: 2.0000 g | PACK | Freq: Once | ORAL | 0 refills | Status: AC

## 2017-07-05 MED ORDER — KETOROLAC TROMETHAMINE 60 MG/2ML IM SOLN
60.0000 mg | Freq: Once | INTRAMUSCULAR | Status: AC
  Administered 2017-07-05: 60 mg via INTRAMUSCULAR
  Filled 2017-07-05: qty 2

## 2017-07-05 NOTE — MAU Provider Note (Signed)
Chief Complaint: Abdominal Pain and Vaginal Discharge   First Provider Initiated Contact with Patient 07/05/17 0512      SUBJECTIVE HPI: Connie Herrera is a 42 y.o. [id number] not currently pregnant who presents to maternity admissions reporting abdominal pain and vaginal discharge. She was seen last month on 05/13/17 for same symptoms, was dx and tx for trich. She reports feeling better for 1-2 weeks then the symptoms returned. She denies having IC with same partner or different partner. She reports vaginal discharge as white thin discharge with odor. She reports vaginal irritation to the touch, she denies vaginal lesions. Se reports associated symptoms of lower abdominal pain- rates pain 7/10, has not taken any medication for pain. She describes the abdominal pain as aching. She denies vaginal bleeding, urinary symptoms, h/a, dizziness, n/v, or fever/chills.    Past Medical History:  Diagnosis Date  . Brain lesion   . Hypertension    Past Surgical History:  Procedure Laterality Date  . TUBAL LIGATION    . TUBAL LIGATION     Social History   Socioeconomic History  . Marital status: Single    Spouse name: Not on file  . Number of children: 4  . Years of education: 37  . Highest education level: Not on file  Occupational History    Employer: [employer]  Social Needs  . Financial resource strain: Not on file  . Food insecurity:    Worry: Not on file    Inability: Not on file  . Transportation needs:    Medical: Not on file    Non-medical: Not on file  Tobacco Use  . Smoking status: Former Smoker    Packs/day: 0.50    Years: 4.00    Pack years: 2.00    Types: Cigarettes    Last attempt to quit: 06/04/2017    Years since quitting: 0.0  . Smokeless tobacco: Current User  Substance and Sexual Activity  . Alcohol use: No  . Drug use: No  . Sexual activity: Not Currently    Birth control/protection: Surgical  Lifestyle  . Physical activity:    Days per  week: Not on file    Minutes per session: Not on file  . Stress: Not on file  Relationships  . Social connections:    Talks on phone: Not on file    Gets together: Not on file    Attends religious service: Not on file    Active member of club or organization: Not on file    Attends meetings of clubs or organizations: Not on file    Relationship status: Not on file  . Intimate partner violence:    Fear of current or ex partner: Not on file    Emotionally abused: Not on file    Physically abused: Not on file    Forced sexual activity: Not on file  Other Topics Concern  . Not on file  Social History Narrative   Patient lives at home with children.    Patient has 4 children.    Patient is single.    Patient has 2 years of college.    Patient is right handed.    Patient is working full time. [employer].    No current facility-administered medications on file prior to encounter.    Current Outpatient Medications on File Prior to Encounter  Medication Sig Dispense Refill  . acyclovir (ZOVIRAX) 200 MG capsule TAKE 1 CAPSULE BY MOUTH AS DIRECTED, TAKE 4 CAPSULES TWICE  DAILY FOR 5 DAYS. FOR OUTBREAK, 2 CAPSULES TWICE DAILY FOR SUPPRESSION 90 capsule 2  . amLODipine (NORVASC) 5 MG tablet Take 1 tablet (5 mg total) by mouth daily. 30 tablet 0  . ibuprofen (ADVIL,MOTRIN) 800 MG tablet Take 1 tablet (800 mg total) by mouth every 8 (eight) hours as needed. 60 tablet 1  . naproxen (NAPROSYN) 500 MG tablet Take 1 tablet (500 mg total) by mouth 2 (two) times daily. 30 tablet 0  . nitrofurantoin, macrocrystal-monohydrate, (MACROBID) 100 MG capsule Take 1 capsule (100 mg total) by mouth 2 (two) times daily. 10 capsule 0  . ondansetron (ZOFRAN) 4 MG tablet Take 1 tablet (4 mg total) by mouth every 6 (six) hours. 12 tablet 0   Allergies  Allergen Reactions  . Hydrocodone Nausea And Vomiting    Patient states that she can take medication it just makes her have bad nausea    ROS:   Review of Systems  Constitutional: Negative.   Respiratory: Negative.   Cardiovascular: Negative.   Gastrointestinal: Positive for abdominal pain. Negative for constipation, diarrhea, nausea and vomiting.  Genitourinary: Positive for vaginal discharge. Negative for difficulty urinating, dysuria, frequency, genital sores, menstrual problem, pelvic pain, urgency and vaginal bleeding.    I have reviewed patient's Past Medical Hx, Surgical Hx, Family Hx, Social Hx, medications and allergies.   Physical Exam   Patient Vitals for the past 24 hrs:  BP Temp Temp src Pulse Resp SpO2 Height Weight  07/05/17 0627 (!) 154/94 - - 69 18 - - -  07/05/17 0607 (!) 173/99 - - - 18 - - -  07/05/17 0450 (!) 147/87 98.4 F (36.9 C) Oral 76 16 100 % 5\' 7"  (1.702 m) 199 lb (90.3 kg)   Constitutional: Well-developed, well-nourished female in no acute distress.  Cardiovascular: normal rate Respiratory: normal effort GI: Abd soft, non-tender. Pos BS x 4 MS: Extremities nontender, no edema, normal ROM Neurologic: Alert and oriented x 4.   PELVIC EXAM: Cervix pink, visually closed, without lesion, moderate white creamy discharge with mild odor, vaginal walls and external genitalia normal, no lesions noted  Bimanual exam: Cervix 0/long/high, firm, anterior, neg CMT, uterus nontender, nonenlarged, adnexa without tenderness, enlargement, or mass   LAB RESULTS Results for orders placed or performed during the hospital encounter of 07/05/17 (from the past 24 hour(s))  Urinalysis, Routine w reflex microscopic     Status: Abnormal   Collection Time: 07/05/17  5:00 AM  Result Value Ref Range   Color, Urine YELLOW YELLOW   APPearance CLOUDY (A) CLEAR   Specific Gravity, Urine 1.013 1.005 - 1.030   pH 7.0 5.0 - 8.0   Glucose, UA NEGATIVE NEGATIVE mg/dL   Hgb urine dipstick NEGATIVE NEGATIVE   Bilirubin Urine NEGATIVE NEGATIVE   Ketones, ur NEGATIVE NEGATIVE mg/dL   Protein, ur NEGATIVE NEGATIVE mg/dL    Nitrite NEGATIVE NEGATIVE   Leukocytes, UA NEGATIVE NEGATIVE  Pregnancy, urine POC     Status: None   Collection Time: 07/05/17  5:08 AM  Result Value Ref Range   Preg Test, Ur NEGATIVE NEGATIVE  Wet prep, genital     Status: Abnormal   Collection Time: 07/05/17  5:25 AM  Result Value Ref Range   Yeast Wet Prep HPF POC NONE SEEN NONE SEEN   Trich, Wet Prep NONE SEEN NONE SEEN   Clue Cells Wet Prep HPF POC PRESENT (A) NONE SEEN   WBC, Wet Prep HPF POC FEW (A) NONE SEEN   Sperm NONE  SEEN     MAU Management/MDM: Orders Placed This Encounter  Procedures  . Wet prep, genital  . Urinalysis, Routine w reflex microscopic  . Pregnancy, urine POC  UA- negative  Wet prep- positive clue cells, will treat for BV based on clinical finding   Meds ordered this encounter  Medications  . ketorolac (TORADOL) injection 60 mg    Treatments in MAU included 60mg  Toradol IM for abdominal pain- pain relieved with medication.   Pt discharged. Pt stable prior to discharge.   ASSESSMENT 1. BV (bacterial vaginosis)   2. Lower abdominal pain   3.      Chronic hypertension   PLAN Discharge home. Pt stable prior to discharge. Discussed taken BP medication- Norvasc as scheduled and not missing any medication.  Rx Solosec for BV Follow up with PCP or urgent care for worsening symptoms     Allergies as of 07/05/2017      Reactions   Hydrocodone Nausea And Vomiting   Patient states that she can take medication it just makes her have bad nausea      Medication List    STOP taking these medications   nitrofurantoin (macrocrystal-monohydrate) 100 MG capsule Commonly known as:  MACROBID   ondansetron 4 MG tablet Commonly known as:  ZOFRAN     TAKE these medications   acyclovir 200 MG capsule Commonly known as:  ZOVIRAX TAKE 1 CAPSULE BY MOUTH AS DIRECTED, TAKE 4 CAPSULES TWICE DAILY FOR 5 DAYS. FOR OUTBREAK, 2 CAPSULES TWICE DAILY FOR SUPPRESSION   amLODipine 5 MG tablet Commonly known as:   NORVASC Take 1 tablet (5 mg total) by mouth daily.   ibuprofen 800 MG tablet Commonly known as:  ADVIL,MOTRIN Take 1 tablet (800 mg total) by mouth every 8 (eight) hours as needed.   naproxen 500 MG tablet Commonly known as:  NAPROSYN Take 1 tablet (500 mg total) by mouth 2 (two) times daily.   Secnidazole 2 g Pack Commonly known as:  SOLOSEC Take 2 g by mouth once for 1 dose.      Darrol Poke  Certified Nurse-Midwife 07/05/2017  7:29 AM

## 2017-07-05 NOTE — MAU Note (Signed)
Pt presents to MAU with vaginal discharge and abdominal pain. Pt states that she was here last month and was treated for an STD, she took the medicine, but has been feeling bad for the last couple of days.

## 2017-07-06 LAB — GC/CHLAMYDIA PROBE AMP (~~LOC~~) NOT AT ARMC
Chlamydia: NEGATIVE
Neisseria Gonorrhea: NEGATIVE

## 2017-11-21 ENCOUNTER — Other Ambulatory Visit: Payer: Self-pay | Admitting: Advanced Practice Midwife

## 2018-01-10 ENCOUNTER — Ambulatory Visit (HOSPITAL_COMMUNITY)
Admission: EM | Admit: 2018-01-10 | Discharge: 2018-01-10 | Disposition: A | Discharge status: Home / Self Care | Payer: Self-pay | Attending: Family Medicine | Admitting: Family Medicine

## 2018-01-10 ENCOUNTER — Ambulatory Visit (INDEPENDENT_AMBULATORY_CARE_PROVIDER_SITE_OTHER): Payer: Self-pay

## 2018-01-10 ENCOUNTER — Encounter (HOSPITAL_COMMUNITY): Payer: Self-pay

## 2018-01-10 DIAGNOSIS — R52 Pain, unspecified: Secondary | ICD-10-CM

## 2018-01-10 DIAGNOSIS — S92505A Nondisplaced unspecified fracture of left lesser toe(s), initial encounter for closed fracture: Secondary | ICD-10-CM

## 2018-01-10 NOTE — Discharge Instructions (Signed)
Return if any problems.

## 2018-01-10 NOTE — ED Triage Notes (Signed)
Pt presents with pain to left greater toe after stubbing it on the vacuum cleaner last night. Decreased rom.

## 2018-01-10 NOTE — ED Provider Notes (Signed)
[facility]    CSN: [id number] Arrival date & time: 01/10/18  0856     History   Chief Complaint Chief Complaint  Patient presents with  . Toe Injury    HPI Connie Herrera is a 42 y.o. female.   The history is provided by the patient. No language interpreter was used.  Toe Pain  This is a new problem. The current episode started 2 days ago. The problem occurs constantly. The problem has been gradually worsening. Nothing aggravates the symptoms. Nothing relieves the symptoms. She has tried nothing for the symptoms. The treatment provided no relief.  Pt reports she injured her toe 2 days ago.  Pt complains of pain with walking  Past Medical History:  Diagnosis Date  . Brain lesion   . Hypertension     Patient Active Problem List   Diagnosis Date Noted  . Paresthesias 08/04/2013  . Breast pain, right 03/11/2012    Past Surgical History:  Procedure Laterality Date  . TUBAL LIGATION    . TUBAL LIGATION      OB History    Gravida  4   Para  4   Term  4   Preterm      AB      Living  4     SAB      TAB      Ectopic      Multiple      Live Births  4            Home Medications    Prior to Admission medications   Medication Sig Start Date End Date Taking? Authorizing Provider  acyclovir (ZOVIRAX) 200 MG capsule  TAKE 1 CAPSULE BY MOUTH AS DIRECTED. TAKE 4 CAPSULE TWICE DAILY FOR 5 DAYS FOR OUTBREAK, TAKE 2 CAPSULES TWICE DAILY FOR SUPPRESSION 11/23/17   Tamala Julian, [place], CNM  amLODipine (NORVASC) 5 MG tablet Take 1 tablet (5 mg total) by mouth daily. 10/04/16   Shelda Pal, DO  ibuprofen (ADVIL,MOTRIN) 800 MG tablet Take 1 tablet (800 mg total) by mouth every 8 (eight) hours as needed. 11/27/15   Leftwich-Kirby, Kathie Dike, CNM  naproxen (NAPROSYN) 500 MG tablet Take 1 tablet (500 mg total) by mouth 2 (two) times daily. 12/27/16   Jaynee Eagles, PA-C    Family History Family History  Problem Relation Age of Onset  . Cancer  Maternal Grandmother 63       breast    Social History Social History   Tobacco Use  . Smoking status: Former Smoker    Packs/day: 0.50    Years: 4.00    Pack years: 2.00    Types: Cigarettes    Last attempt to quit: 06/04/2017    Years since quitting: 0.6  . Smokeless tobacco: Current User  Substance Use Topics  . Alcohol use: No  . Drug use: No     Allergies   Hydrocodone   Review of Systems Review of Systems  All other systems reviewed and are negative.    Physical Exam Triage Vital Signs ED Triage Vitals  Enc Vitals Group     BP 01/10/18 0921 (!) 152/99     Pulse Rate 01/10/18 0921 74     Resp 01/10/18 0921 16     Temp 01/10/18 0921 98.3 F (36.8 C)     Temp Source 01/10/18 0921 Oral     SpO2 01/10/18 0921 100 %     Weight --      Height --  Head Circumference --      Peak Flow --      Pain Score 01/10/18 0924 7     Pain Loc --      Pain Edu? --      Excl. in [place]? --    No data found.  Updated Vital Signs BP (!) 152/99 (BP Location: Right Arm)   Pulse 74   Temp 98.3 F (36.8 C) (Oral)   Resp 16   LMP 01/08/2018 (Exact Date)   SpO2 100%   Visual Acuity Right Eye Distance:   Left Eye Distance:   Bilateral Distance:    Right Eye Near:   Left Eye Near:    Bilateral Near:     Physical Exam  Musculoskeletal: She exhibits tenderness.  Tender 5th toe, pain with movement, nv and ns intact   Neurological: She is alert.  Skin: Skin is warm.  Psychiatric: She has a normal mood and affect.  Nursing note and vitals reviewed.    UC Treatments / Results  Labs (all labs ordered are listed, but only abnormal results are displayed) Labs Reviewed - No data to display  EKG None  Radiology Dg Foot Complete Left  Result Date: 01/10/2018 CLINICAL DATA:  Per pt: tripped and fell last night, injured the left foot. Pain is the left foot, 4th AND 5th toes, lateral left 5th metatarsal. No prior injury to the left foot. Patient is not a diabetic  EXAM: LEFT FOOT - COMPLETE 3+ VIEW COMPARISON:  None. FINDINGS: There is an oblique fracture of the proximal phalanx of the 5th digit, associated minimal angulation and displacement. Associated soft tissue swelling is present. No radiopaque foreign body or soft tissue gas. IMPRESSION: Fracture of the 5th proximal phalanx. Electronically Signed   By: Nolon Nations M.D.   On: 01/10/2018 10:37    Procedures Procedures (including critical care time)  Medications Ordered in UC Medications - No data to display  Initial Impression / Assessment and Plan / UC Course  I have reviewed the triage vital signs and the nursing notes.  Pertinent labs & imaging results that were available during my care of the patient were reviewed by me and considered in my medical decision making (see chart for details).      Final Clinical Impressions(s) / UC Diagnoses   Final diagnoses:  Pain  Closed nondisplaced fracture of phalanx of lesser toe of left foot, unspecified phalanx, initial encounter     Discharge Instructions     Return if any problems.    ED Prescriptions    None     Controlled Substance Prescriptions [place] Controlled Substance Registry consulted? Not Applicable   Fransico Meadow, [place] 01/10/18 1051

## 2018-01-27 ENCOUNTER — Other Ambulatory Visit: Payer: Self-pay | Admitting: Certified Nurse Midwife

## 2018-02-25 ENCOUNTER — Other Ambulatory Visit: Payer: Self-pay | Admitting: Advanced Practice Midwife

## 2019-01-22 ENCOUNTER — Ambulatory Visit (HOSPITAL_COMMUNITY)
Admission: EM | Admit: 2019-01-22 | Discharge: 2019-01-22 | Disposition: A | Discharge status: Home / Self Care | Payer: Self-pay | Attending: Family | Admitting: Family

## 2019-01-22 ENCOUNTER — Encounter (HOSPITAL_COMMUNITY): Payer: Self-pay

## 2019-01-22 ENCOUNTER — Other Ambulatory Visit: Payer: Self-pay

## 2019-01-22 DIAGNOSIS — R350 Frequency of micturition: Secondary | ICD-10-CM | POA: Insufficient documentation

## 2019-01-22 DIAGNOSIS — I10 Essential (primary) hypertension: Secondary | ICD-10-CM | POA: Insufficient documentation

## 2019-01-22 DIAGNOSIS — N898 Other specified noninflammatory disorders of vagina: Secondary | ICD-10-CM

## 2019-01-22 LAB — POCT URINALYSIS DIP (DEVICE)
Bilirubin Urine: NEGATIVE
Glucose, UA: NEGATIVE mg/dL
Hgb urine dipstick: NEGATIVE
Ketones, ur: NEGATIVE mg/dL
Nitrite: NEGATIVE
Protein, ur: NEGATIVE mg/dL
Specific Gravity, Urine: 1.025 (ref 1.005–1.030)
Urobilinogen, UA: 0.2 mg/dL (ref 0.0–1.0)
pH: 7.5 (ref 5.0–8.0)

## 2019-01-22 MED ORDER — METRONIDAZOLE 500 MG PO TABS: 500.0000 mg | ORAL_TABLET | Freq: Two times a day (BID) | ORAL | 0 refills | Status: AC

## 2019-01-22 NOTE — ED Triage Notes (Addendum)
Pt states she has a UTI. Pt states she has pressure. X 1 week. Pt states she has not been taking her B/P meds.

## 2019-01-22 NOTE — Discharge Instructions (Signed)
Your urine does not obviously indicate urinary tract infection, I suspect your symptoms are from your vagina.  We will treat you for bacterial vaginosis with vaginal testing and a urine culture still pending. Will notify your of any positive findings and if any changes to treatment are needed.   Please restart your blood pressure medications.  Please follow up with your PCP and/or gynecology for any persistent symptoms.

## 2019-01-22 NOTE — ED Provider Notes (Signed)
[facility]    CSN: [id number] Arrival date & time: 01/22/19  1117      History   Chief Complaint Chief Complaint  Patient presents with  . Urinary Tract Infection    HPI Connie Herrera is a 43 y.o. female.   Connie Herrera presents with complaints of pelvic pressure, noted odor and some frequency of urination. No pain with urination. Occasional back pain. White vaginal discharge. No vaginal itching. History  Of bv and states it was similar. No specific known history of UTI's. Sexually active  With 1 partner and uses condoms. No fevers. No other gi symptoms. Symptoms started 1 week ago. History  Of htn, states she has not been taking her medication for this. Per chart review it appears had similar complaints with resulting BV diagnosis 07/05/2017.   ROS per HPI, negative if not otherwise mentioned.      Past Medical History:  Diagnosis Date  . Brain lesion   . Hypertension     Patient Active Problem List   Diagnosis Date Noted  . Paresthesias 08/04/2013  . Breast pain, right 03/11/2012    Past Surgical History:  Procedure Laterality Date  . TUBAL LIGATION    . TUBAL LIGATION      OB History    Gravida  4   Para  4   Term  4   Preterm      AB      Living  4     SAB      TAB      Ectopic      Multiple      Live Births  4            Home Medications    Prior to Admission medications   Medication Sig Start Date End Date Taking? Authorizing Provider  acyclovir (ZOVIRAX) 200 MG capsule  TAKE 1 CAPSULE BY MOUTH AS DIRECTED. TAKE 4 CAPSULE TWICE DAILY FOR 5 DAYS FOR OUTBREAK, TAKE 2 CAPSULES TWICE DAILY FOR SUPPRESSION 11/23/17   Tamala Julian, [place], CNM  acyclovir (ZOVIRAX) 200 MG capsule  TAKE 1 CAPSULE BY MOUTH AS DIRECTED. TAKE 4 CAPSULE TWICE DAILY FOR 5 DAYS FOR OUTBREAK, TAKE 2 CAPSULES TWICE DAILY FOR SUPPRESSION 02/27/18   Tamala Julian, [place], CNM  amLODipine (NORVASC) 5 MG tablet Take 1 tablet (5 mg total) by mouth daily.  10/04/16   Shelda Pal, DO  ibuprofen (ADVIL,MOTRIN) 800 MG tablet Take 1 tablet (800 mg total) by mouth every 8 (eight) hours as needed. 11/27/15   Leftwich-Kirby, Kathie Dike, CNM  metroNIDAZOLE (FLAGYL) 500 MG tablet Take 1 tablet (500 mg total) by mouth 2 (two) times daily for 7 days. 01/22/19 01/29/19  Zigmund Gottron, NP  naproxen (NAPROSYN) 500 MG tablet Take 1 tablet (500 mg total) by mouth 2 (two) times daily. 12/27/16   Jaynee Eagles, PA-C    Family History Family History  Problem Relation Age of Onset  . Cancer Maternal Grandmother 45       breast    Social History Social History   Tobacco Use  . Smoking status: Former Smoker    Packs/day: 0.50    Years: 4.00    Pack years: 2.00    Types: Cigarettes    Quit date: 06/04/2017    Years since quitting: 1.6  . Smokeless tobacco: Current User  Substance Use Topics  . Alcohol use: No  . Drug use: No     Allergies   Hydrocodone   Review  of Systems Review of Systems   Physical Exam Triage Vital Signs ED Triage Vitals  Enc Vitals Group     BP 01/22/19 1131 (!) 160/105     Pulse Rate 01/22/19 1131 90     Resp 01/22/19 1131 18     Temp 01/22/19 1131 98.4 F (36.9 C)     Temp Source 01/22/19 1131 Oral     SpO2 01/22/19 1131 100 %     Weight 01/22/19 1128 180 lb (81.6 kg)     Height --      Head Circumference --      Peak Flow --      Pain Score 01/22/19 1128 8     Pain Loc --      Pain Edu? --      Excl. in [place]? --    No data found.  Updated Vital Signs BP (!) 160/105 (BP Location: Right Arm)   Pulse 90   Temp 98.4 F (36.9 C) (Oral)   Resp 18   Wt 180 lb (81.6 kg)   LMP 01/10/2019   SpO2 100%   BMI 28.19 kg/m    Physical Exam Constitutional:      General: She is not in acute distress.    Appearance: She is well-developed.  Cardiovascular:     Rate and Rhythm: Normal rate.  Pulmonary:     Effort: Pulmonary effort is normal.  Abdominal:     Palpations: Abdomen is soft. Abdomen is not  rigid.     Tenderness: There is no right CVA tenderness, left CVA tenderness, guarding or rebound.     Comments: Mild "pressure" to suprapubic abdomen on palpation   Genitourinary:    Comments: Denies sores, lesions, vaginal bleeding; no pelvic pain; gu exam deferred at this time, vaginal self swab collected.   Skin:    General: Skin is warm and dry.  Neurological:     Mental Status: She is alert and oriented to person, place, and time.      UC Treatments / Results  Labs (all labs ordered are listed, but only abnormal results are displayed) Labs Reviewed  POCT URINALYSIS DIP (DEVICE) - Abnormal; Notable for the following components:      Result Value   Leukocytes,Ua LARGE (*)    All other components within normal limits  URINE CULTURE  CERVICOVAGINAL ANCILLARY ONLY    EKG   Radiology No results found.  Procedures Procedures (including critical care time)  Medications Ordered in UC Medications - No data to display  Initial Impression / Assessment and Plan / UC Course  I have reviewed the triage vital signs and the nursing notes.  Pertinent labs & imaging results that were available during my care of the patient were reviewed by me and considered in my medical decision making (see chart for details).     Large leuks to urine, with only mild symptoms of UTI, in presence of complaints of vaginal discharge. I feel that this is likely related to bv rather than UTI, but culture is pending. If positive will initiate antibiotics. Flagyl initiated at this time, also with vaginal cytology pending. Bp discussed and encouraged to restart taking medications. Encouraged follow up with PCP for recheck if she is unable to tolerate her BP medications. Return precautions provided. Patient verbalized understanding and agreeable to plan.   Final Clinical Impressions(s) / UC Diagnoses   Final diagnoses:  Vaginal discharge  Urinary frequency     Discharge Instructions     Your urine  does not obviously indicate urinary tract infection, I suspect your symptoms are from your vagina.  We will treat you for bacterial vaginosis with vaginal testing and a urine culture still pending. Will notify your of any positive findings and if any changes to treatment are needed.   Please restart your blood pressure medications.  Please follow up with your PCP and/or gynecology for any persistent symptoms.    ED Prescriptions    Medication Sig Dispense Auth. Provider   metroNIDAZOLE (FLAGYL) 500 MG tablet Take 1 tablet (500 mg total) by mouth 2 (two) times daily for 7 days. 14 tablet Zigmund Gottron, NP     PDMP not reviewed this encounter.   Zigmund Gottron, NP 01/22/19 1210

## 2019-01-23 LAB — URINE CULTURE

## 2019-01-24 ENCOUNTER — Telehealth (HOSPITAL_COMMUNITY): Payer: Self-pay | Admitting: Emergency Medicine

## 2019-01-24 ENCOUNTER — Encounter (HOSPITAL_COMMUNITY): Payer: Self-pay

## 2019-01-24 LAB — CERVICOVAGINAL ANCILLARY ONLY
Bacterial vaginitis: POSITIVE — AB
Candida vaginitis: NEGATIVE
Chlamydia: NEGATIVE
Neisseria Gonorrhea: NEGATIVE
Trichomonas: POSITIVE — AB

## 2019-01-24 NOTE — Telephone Encounter (Signed)
Bacterial Vaginosis test is positive.  Prescription for metronidazole was given at the urgent care visit.  Trichomonas is positive. Rx metronidazole was given at the urgent care visit. Pt needs education to please refrain from sexual intercourse for 7 days to give the medicine time to work. Sexual partners need to be notified and tested/treated. Condoms may reduce risk of reinfection. Recheck for further evaluation if symptoms are not improving.   Attempted to reach patient. No answer at this time. Voicemail left.    

## 2019-01-25 ENCOUNTER — Other Ambulatory Visit (HOSPITAL_COMMUNITY): Payer: Self-pay | Admitting: *Deleted

## 2019-01-25 DIAGNOSIS — N6320 Unspecified lump in the left breast, unspecified quadrant: Secondary | ICD-10-CM

## 2019-01-25 NOTE — Progress Notes (Signed)
Patient ID: Connie Herrera, female   DOB: Aug 27, 1975, 43 y.o.   MRN: [id number]   Virtual Visit via Telephone Note  I connected with Connie Herrera on 01/26/19 at  1:50 PM EDT by telephone and verified that I am speaking with the correct person using two identifiers.   I discussed the limitations, risks, security and privacy concerns of performing an evaluation and management service by telephone and the availability of in person appointments. I also discussed with the patient that there may be a patient responsible charge related to this service. The patient expressed understanding and agreed to proceed.  Patient location: My Location:  [place] office Persons on the call:  History of Present Illness: After being seen and treated for trich in the UC on 01/22/2019.  She does not want to take amlodipine for BP bc she says it makes her kidneys hurt.  Previously she was on Lisinopril and had a cough for about 5 months.  She has not tried any other BP meds.  She has a few more days on metronidazole.  She denies CP/SOB/HA.     Observations/Objective:  NAD.  A&Ox3   Assessment and Plan: 1. Trichomonas vaginitis Finish metronidazole and test for cure next week - Urine cytology ancillary only; Future  2. Hypertension, unspecified type Check BP OOO 3-5 times weekly and record.  Patient says she doesn't want to take Rx but reluctantly agrees.   - hydrochlorothiazide (HYDRODIURIL) 25 MG tablet; Take 1 tablet (25 mg total) by mouth daily.  Dispense: 90 tablet; Refill: 3 - Basic metabolic panel; Future We have discussed target BP range and blood pressure goal. I have advised patient to check BP regularly and to call us back or report to clinic if the numbers are consistently higher than 140/90. We discussed the importance of compliance with medical therapy and DASH diet recommended, consequences of uncontrolled hypertension discussed.     3. Encounter for examination following treatment at  hospital     Follow Up Instructions: 6 weeks assign PCP  I discussed the assessment and treatment plan with the patient. The patient was provided an opportunity to ask questions and all were answered. The patient agreed with the plan and demonstrated an understanding of the instructions.   The patient was advised to call back or seek an in-person evaluation if the symptoms worsen or if the condition fails to improve as anticipated.  I provided 13 minutes of non-face-to-face time during this encounter.   Freeman Caldron, PA-C

## 2019-01-26 ENCOUNTER — Ambulatory Visit: Payer: Self-pay | Attending: Family Medicine | Admitting: Physician Assistant

## 2019-01-26 ENCOUNTER — Other Ambulatory Visit: Payer: Self-pay

## 2019-01-26 DIAGNOSIS — I10 Essential (primary) hypertension: Secondary | ICD-10-CM

## 2019-01-26 DIAGNOSIS — A5901 Trichomonal vulvovaginitis: Secondary | ICD-10-CM

## 2019-01-26 DIAGNOSIS — Z09 Encounter for follow-up examination after completed treatment for conditions other than malignant neoplasm: Secondary | ICD-10-CM

## 2019-01-26 MED ORDER — HYDROCHLOROTHIAZIDE 25 MG PO TABS: 25.0000 mg | ORAL_TABLET | Freq: Every day | ORAL | 3 refills | Status: DC

## 2019-02-01 ENCOUNTER — Telehealth (HOSPITAL_COMMUNITY): Payer: Self-pay | Admitting: Emergency Medicine

## 2019-02-01 ENCOUNTER — Ambulatory Visit: Payer: Self-pay | Attending: Family Medicine

## 2019-02-01 ENCOUNTER — Other Ambulatory Visit: Payer: Self-pay

## 2019-02-01 DIAGNOSIS — A5901 Trichomonal vulvovaginitis: Secondary | ICD-10-CM

## 2019-02-01 DIAGNOSIS — I10 Essential (primary) hypertension: Secondary | ICD-10-CM

## 2019-02-01 NOTE — Telephone Encounter (Signed)
Called patient at [phone].  Patient made video visit for heart flutter after taking blood pressure medication for tomorrow at 8:30 AM.  Told patient this was not appropriate for a video visit and she should go to the ER right away for further evaluation and management.  Instructed patient to have a family member or friend transport her, or call 911 if she did not have transportation.  Patient aware and in agreement with this plan.  Aware of consequences associated with not going to the ER including missed diagnosis, organ damage, organ failure and/or death.

## 2019-02-02 ENCOUNTER — Telehealth: Payer: Self-pay

## 2019-02-02 LAB — BASIC METABOLIC PANEL
BUN/Creatinine Ratio: 11 (ref 9–23)
BUN: 9 mg/dL (ref 6–24)
CO2: 27 mmol/L (ref 20–29)
Calcium: 9.6 mg/dL (ref 8.7–10.2)
Chloride: 97 mmol/L (ref 96–106)
Creatinine, Ser: 0.82 mg/dL (ref 0.57–1.00)
GFR calc Af Amer: 102 mL/min/{1.73_m2} (ref >59)
GFR calc non Af Amer: 89 mL/min/{1.73_m2} (ref >59)
Glucose: 123 mg/dL — ABNORMAL HIGH (ref 65–99)
Potassium: 3.7 mmol/L (ref 3.5–5.2)
Sodium: 137 mmol/L (ref 134–144)

## 2019-02-07 ENCOUNTER — Ambulatory Visit
Admission: RE | Admit: 2019-02-07 | Discharge: 2019-02-07 | Disposition: A | Discharge status: Home / Self Care | Payer: No Typology Code available for payment source | Source: Ambulatory Visit | Attending: Obstetrics and Gynecology | Admitting: Obstetrics and Gynecology

## 2019-02-07 ENCOUNTER — Ambulatory Visit (HOSPITAL_COMMUNITY)
Admission: RE | Admit: 2019-02-07 | Discharge: 2019-02-07 | Disposition: A | Discharge status: Home / Self Care | Payer: Self-pay | Source: Ambulatory Visit | Attending: Obstetrics and Gynecology | Admitting: Obstetrics and Gynecology

## 2019-02-07 ENCOUNTER — Encounter (HOSPITAL_COMMUNITY): Payer: Self-pay

## 2019-02-07 ENCOUNTER — Other Ambulatory Visit: Payer: Self-pay

## 2019-02-07 DIAGNOSIS — N6320 Unspecified lump in the left breast, unspecified quadrant: Secondary | ICD-10-CM

## 2019-02-07 DIAGNOSIS — Z1239 Encounter for other screening for malignant neoplasm of breast: Secondary | ICD-10-CM | POA: Insufficient documentation

## 2019-02-07 DIAGNOSIS — N644 Mastodynia: Secondary | ICD-10-CM | POA: Insufficient documentation

## 2019-02-07 LAB — URINE CYTOLOGY ANCILLARY ONLY
Bacterial Vaginitis-Urine: NEGATIVE
Candida Urine: NEGATIVE
Chlamydia: NEGATIVE
Comment: NEGATIVE
Comment: NEGATIVE
Comment: NORMAL
Neisseria Gonorrhea: NEGATIVE
Trichomonas: NEGATIVE

## 2019-02-07 NOTE — Patient Instructions (Addendum)
Explained breast self awareness with [name]. Patient did not need a Pap smear today due to last Pap smear and HPV typing was 11/28/2015. Let her know [facility] will cover Pap smears and HPV typing every 5 years unless has a history of abnormal Pap smears. Referred patient to the [facility] for a diagnostic mammogram. Appointment scheduled for Tuesday, February 07, 2019 at [place]. Patient aware of appointment and will be there. Discussed with patient her high blood pressure (BP). Patient states she was prescribed a prescription for her BP but stopped taking due to side effects. Patient advised to call her PCP about her BP and medication side effects. [name] verbalized understanding.  [place]annock, Arvil Chaco, RN 8:49 AM

## 2019-02-07 NOTE — Progress Notes (Signed)
Complaints of left breast lump x 2 months that went away and came back. Patient stated it is painful when lays on it. Patient rates the pain at a 5 out of 10.  Pap Smear: Pap smear not completed today. Last Pap smear was 11/28/2015 at [facility] and normal with negative HPV. Per patient has no history of an abnormal Pap smear. Last two Pap smear results are in Epic.  Physical exam: Breasts Breasts symmetrical. No skin abnormalities bilateral breasts. No nipple retraction bilateral breasts. No nipple discharge bilateral breasts. No lymphadenopathy. No lumps palpated bilateral breasts. Unable to palpate a lump in patients area of concern. Complaints of left diffuse and right outer breast pain on exam. Referred patient to the [facility] for a diagnostic mammogram. Appointment scheduled for Tuesday, February 07, 2019 at [place].        Pelvic/Bimanual No Pap smear completed today since last Pap smear and HPV typing was 09/17/2016. Pap smear not indicated per [facility] guidelines.   Smoking History: Patient is a former smoker that quit 06/04/2017.  Patient Navigation: Patient education provided. Access to services provided for patient through [facility] program.   Breast and Cervical Cancer Risk Assessment: Patient has a family history of her maternal grandmother having breast cancer. Patient has no known genetic mutations or history of radiation treatment to the chest before age 39. Patient has no history of cervical dysplasia, immunocompromised, or DES exposure in-utero.  Risk Assessment    Risk Scores      02/07/2019   Last edited by: Loletta Parish, RN   5-year risk: 0.7 %   Lifetime risk: 9.5 %

## 2019-02-08 ENCOUNTER — Other Ambulatory Visit: Payer: Self-pay

## 2019-02-08 ENCOUNTER — Encounter (HOSPITAL_COMMUNITY): Payer: Self-pay | Admitting: Obstetrics and Gynecology

## 2019-02-08 ENCOUNTER — Emergency Department (HOSPITAL_COMMUNITY)
Admission: EM | Admit: 2019-02-08 | Discharge: 2019-02-08 | Disposition: A | Discharge status: Home / Self Care | Payer: No Typology Code available for payment source | Attending: Emergency Medicine | Admitting: Emergency Medicine

## 2019-02-08 DIAGNOSIS — I10 Essential (primary) hypertension: Secondary | ICD-10-CM

## 2019-02-08 DIAGNOSIS — Z79899 Other long term (current) drug therapy: Secondary | ICD-10-CM | POA: Insufficient documentation

## 2019-02-08 DIAGNOSIS — R002 Palpitations: Secondary | ICD-10-CM

## 2019-02-08 DIAGNOSIS — F17220 Nicotine dependence, chewing tobacco, uncomplicated: Secondary | ICD-10-CM | POA: Insufficient documentation

## 2019-02-08 MED ORDER — ACETAMINOPHEN 325 MG PO TABS
650.0000 mg | ORAL_TABLET | Freq: Once | ORAL | Status: DC
  Filled 2019-02-08: qty 2

## 2019-02-08 NOTE — ED Provider Notes (Signed)
[facility] DEPT Provider Note   CSN: [id number] Arrival date & time: 02/08/19  1005     History   Chief Complaint Chief Complaint  Patient presents with   Hypertension   Anxiety    HPI Connie Herrera is a 43 y.o. female.  She is complaining of elevated blood pressure and feeling anxious about.  She said she had a headache for the last couple of days.  She was noted to have elevated blood pressure at urgent care and they put her on HCTZ.  She said she started taking it but then she started having palpitations and so stopped the medication.  She used to be on lisinopril but it caused her cough so she stopped that and has been using homeopathic medications to treat her blood pressure.  She is starting with a new primary care doctor soon.  No blurry vision double vision numbness weakness chest pain or shortness of breath.     The history is provided by the patient.  Hypertension This is a chronic problem. The current episode started more than 1 week ago. The problem has not changed since onset.Associated symptoms include headaches. Pertinent negatives include no chest pain, no abdominal pain and no shortness of breath. Nothing aggravates the symptoms. Nothing relieves the symptoms. She has tried nothing for the symptoms. The treatment provided no relief.  Palpitations Palpitations quality:  Irregular Onset quality:  Gradual Timing:  Intermittent Progression:  Unchanged Chronicity:  New Context: anxiety   Relieved by:  None tried Worsened by:  Nothing Ineffective treatments:  None tried Associated symptoms: no back pain, no chest pain, no chest pressure, no cough, no diaphoresis, no dizziness, no hemoptysis, no lower extremity edema, no nausea, no near-syncope, no numbness, no shortness of breath, no syncope, no vomiting and no weakness     Past Medical History:  Diagnosis Date   Brain lesion    Hypertension     Patient Active Problem  List   Diagnosis Date Noted   Screening breast examination 02/07/2019   Breast pain 02/07/2019   Hypertension 01/26/2019   Paresthesias 08/04/2013   Breast pain, right 03/11/2012    Past Surgical History:  Procedure Laterality Date   TUBAL LIGATION     TUBAL LIGATION       OB History    Gravida  4   Para  4   Term  4   Preterm      AB      Living  4     SAB      TAB      Ectopic      Multiple      Live Births  4            Home Medications    Prior to Admission medications   Medication Sig Start Date End Date Taking? Authorizing Provider  acyclovir (ZOVIRAX) 200 MG capsule  TAKE 1 CAPSULE BY MOUTH AS DIRECTED. TAKE 4 CAPSULE TWICE DAILY FOR 5 DAYS FOR OUTBREAK, TAKE 2 CAPSULES TWICE DAILY FOR SUPPRESSION 11/23/17   Tamala Julian, [place], CNM  acyclovir (ZOVIRAX) 200 MG capsule  TAKE 1 CAPSULE BY MOUTH AS DIRECTED. TAKE 4 CAPSULE TWICE DAILY FOR 5 DAYS FOR OUTBREAK, TAKE 2 CAPSULES TWICE DAILY FOR SUPPRESSION 02/27/18   Tamala Julian, [place], CNM  hydrochlorothiazide (HYDRODIURIL) 25 MG tablet Take 1 tablet (25 mg total) by mouth daily. 01/26/19   Argentina Donovan, PA-C  ibuprofen (ADVIL,MOTRIN) 800 MG tablet Take 1 tablet (800 mg  total) by mouth every 8 (eight) hours as needed. 11/27/15   Leftwich-Kirby, Kathie Dike, CNM  naproxen (NAPROSYN) 500 MG tablet Take 1 tablet (500 mg total) by mouth 2 (two) times daily. 12/27/16   Jaynee Eagles, PA-C    Family History Family History  Problem Relation Age of Onset   Cancer Maternal Grandmother 70       breast    Social History Social History   Tobacco Use   Smoking status: Former Smoker    Packs/day: 0.50    Years: 4.00    Pack years: 2.00    Types: Cigarettes    Quit date: 06/04/2017    Years since quitting: 1.6   Smokeless tobacco: Current User  Substance Use Topics   Alcohol use: No   Drug use: No     Allergies   Hydrocodone   Review of Systems Review of Systems  Constitutional: Negative for  diaphoresis and fever.  HENT: Negative for sore throat.   Eyes: Negative for visual disturbance.  Respiratory: Negative for cough, hemoptysis and shortness of breath.   Cardiovascular: Positive for palpitations. Negative for chest pain, syncope and near-syncope.  Gastrointestinal: Negative for abdominal pain, nausea and vomiting.  Genitourinary: Negative for dysuria.  Musculoskeletal: Negative for back pain.  Skin: Negative for rash.  Neurological: Positive for headaches. Negative for dizziness, weakness and numbness.     Physical Exam Updated Vital Signs BP (!) 168/99 (BP Location: Left Arm)    Pulse 84    Temp 98.1 F (36.7 C) (Oral)    Resp 18    LMP 02/08/2019    SpO2 100%   Physical Exam Vitals signs and nursing note reviewed.  Constitutional:      General: She is not in acute distress.    Appearance: She is well-developed.  HENT:     Head: Normocephalic and atraumatic.  Eyes:     Conjunctiva/sclera: Conjunctivae normal.  Neck:     Musculoskeletal: Neck supple.  Cardiovascular:     Rate and Rhythm: Normal rate and regular rhythm.     Pulses: Normal pulses.     Heart sounds: No murmur.  Pulmonary:     Effort: Pulmonary effort is normal. No respiratory distress.     Breath sounds: Normal breath sounds.  Abdominal:     Palpations: Abdomen is soft.     Tenderness: There is no abdominal tenderness.  Musculoskeletal: Normal range of motion.        General: No deformity or signs of injury.  Skin:    General: Skin is warm and dry.  Neurological:     General: No focal deficit present.     Mental Status: She is alert and oriented to person, place, and time.     Sensory: No sensory deficit.     Motor: No weakness.      ED Treatments / Results  Labs (all labs ordered are listed, but only abnormal results are displayed) Labs Reviewed - No data to display  EKG EKG Interpretation  Date/Time:  Wednesday February 08 2019 10:23:53 EST Ventricular Rate:  77 PR  Interval:    QRS Duration: 91 QT Interval:  396 QTC Calculation: 449 R Axis:   -6 Text Interpretation: Sinus rhythm Abnormal R-wave progression, early transition Baseline wander in lead(s) V2 V6 No significant change since 7/18 Confirmed by Aletta Edouard [phone]) on 02/08/2019 10:35:40 AM   Radiology US Breast Ltd Uni Left Inc Axilla  Result Date: 02/07/2019 CLINICAL DATA:  43 year old female presenting for evaluation  of a palpable lump in the left breast. EXAM: DIGITAL DIAGNOSTIC BILATERAL MAMMOGRAM WITH CAD AND TOMO ULTRASOUND LEFT BREAST COMPARISON:  Previous exam(s). ACR Breast Density Category c: The breast tissue is heterogeneously dense, which may obscure small masses. FINDINGS: There are multiple bilateral oval circumscribed masses consistent benign fibrocystic change. The palpable marker has been placed along the lower outer left breast. There are benign masses, likely cysts, within the breast tissue in the vicinity of this marker. No suspicious masses are identified. No suspicious calcifications, masses or areas of distortion are seen in the bilateral breasts. Mammographic images were processed with CAD. Ultrasound targeted to the palpable site in the left breast at 4 o'clock and 9 cm from the nipple demonstrates a hypoechoic lesion within the skin measuring 1.3 x 0.3 x 1.4 cm. A similar smaller lesion is seen at 4 o'clock, 7 cm from the nipple measuring 0.7 x 0.2 x 0.5 cm. IMPRESSION: 1. There are 2 benign sebaceous cysts in the lower outer quadrant of the left breast which corresponds with the palpable area of concern. 2.  No mammographic evidence of malignancy in the bilateral breasts. RECOMMENDATION: Screening mammogram in one year.(Code:SM-B-01Y) I have discussed the findings and recommendations with the patient. If applicable, a reminder letter will be sent to the patient regarding the next appointment. BI-RADS CATEGORY  2: Benign. Electronically Signed   By: Ammie Ferrier M.D.   On:  02/07/2019 10:41   Ms Digital Diag Tomo Bilat  Result Date: 02/07/2019 CLINICAL DATA:  43 year old female presenting for evaluation of a palpable lump in the left breast. EXAM: DIGITAL DIAGNOSTIC BILATERAL MAMMOGRAM WITH CAD AND TOMO ULTRASOUND LEFT BREAST COMPARISON:  Previous exam(s). ACR Breast Density Category c: The breast tissue is heterogeneously dense, which may obscure small masses. FINDINGS: There are multiple bilateral oval circumscribed masses consistent benign fibrocystic change. The palpable marker has been placed along the lower outer left breast. There are benign masses, likely cysts, within the breast tissue in the vicinity of this marker. No suspicious masses are identified. No suspicious calcifications, masses or areas of distortion are seen in the bilateral breasts. Mammographic images were processed with CAD. Ultrasound targeted to the palpable site in the left breast at 4 o'clock and 9 cm from the nipple demonstrates a hypoechoic lesion within the skin measuring 1.3 x 0.3 x 1.4 cm. A similar smaller lesion is seen at 4 o'clock, 7 cm from the nipple measuring 0.7 x 0.2 x 0.5 cm. IMPRESSION: 1. There are 2 benign sebaceous cysts in the lower outer quadrant of the left breast which corresponds with the palpable area of concern. 2.  No mammographic evidence of malignancy in the bilateral breasts. RECOMMENDATION: Screening mammogram in one year.(Code:SM-B-01Y) I have discussed the findings and recommendations with the patient. If applicable, a reminder letter will be sent to the patient regarding the next appointment. BI-RADS CATEGORY  2: Benign. Electronically Signed   By: Ammie Ferrier M.D.   On: 02/07/2019 10:41    Procedures Procedures (including critical care time)  Medications Ordered in ED Medications - No data to display   Initial Impression / Assessment and Plan / ED Course  I have reviewed the triage vital signs and the nursing notes.  Pertinent labs & imaging results  that were available during my care of the patient were reviewed by me and considered in my medical decision making (see chart for details).  Clinical Course as of Feb 07 1941  Wed Feb 08, 2019  1124 Patient  here for evaluation of palpitations.  She feels that the hydrochlorothiazide is causing her palpitations.  Differential includes arrhythmia, metabolic derangement, anxiety.  She said she has had headache for few days.  She had labs done 3 days ago and she declines in getting any labs done today because she did not want to pay for them.  Reviewed those labs and do not see any significant abnormalities.  She has an EKG that shows normal sinus rhythm.  She is agreeable to stay on the cardiac monitor for little while but has to go to work soon.  She has a primary care doctor follow-up.  We will give her some Tylenol for her headache and recheck her blood pressure.   [MB]    Clinical Course User Index [MB] Hayden Rasmussen, MD        Final Clinical Impressions(s) / ED Diagnoses   Final diagnoses:  Essential hypertension  Palpitation    ED Discharge Orders    None       Hayden Rasmussen, MD 02/08/19 1942

## 2019-02-08 NOTE — ED Triage Notes (Signed)
Patent reports she is weak and has a headache and is worried about her blood pressure. Patient reports she has anxiety about her BP and is scared of having a stroke. Patient reports she :"does not feel like herself"

## 2019-02-08 NOTE — Discharge Instructions (Addendum)
You were evaluated in the emergency department for high blood pressure and palpitations along with a headache.  You did not want any blood work or further testing.  Your EKG was unremarkable and you did not have any abnormal rhythms while on the heart monitor.  Please continue your regular blood pressure medicine and follow-up with your primary care doctor.  Return if any concerns.

## 2019-02-13 ENCOUNTER — Other Ambulatory Visit: Payer: Self-pay

## 2019-02-13 ENCOUNTER — Ambulatory Visit: Payer: Self-pay | Attending: Family Medicine

## 2019-02-20 ENCOUNTER — Telehealth: Payer: No Typology Code available for payment source

## 2019-02-28 ENCOUNTER — Telehealth: Payer: Self-pay | Admitting: *Deleted

## 2019-02-28 NOTE — Telephone Encounter (Signed)
Pt was sent a letter from financial dept. Inform them, that the application they submitted was incomplete, since they were missing some documentation at the time of the appointment, Pt need to reschedule and resubmit all new papers and application for [employer] and [facility], P.S. old documents has been sent back by mail to the Pt and Pt. need to make a new appt. 

## 2019-03-01 ENCOUNTER — Inpatient Hospital Stay: Payer: Self-pay | Attending: Obstetrics and Gynecology | Admitting: *Deleted

## 2019-03-01 ENCOUNTER — Other Ambulatory Visit: Payer: Self-pay

## 2019-03-01 VITALS — BP 158/103 | Temp 98.2°F | Ht 67.5 in | Wt 217.0 lb

## 2019-03-01 DIAGNOSIS — Z0000 Encounter for general adult medical examination without abnormal findings: Secondary | ICD-10-CM

## 2019-03-01 NOTE — Progress Notes (Signed)
Wisewoman initial screening    Clinical Measurement:  Height: 67.5 in Weight: 217 lb  Blood Pressure: 162/103  Blood Pressure #2: 158/103 Fasting Labs Drawn Today, will review with patient when they result.   Medical History:  Patient states that she does not have a history of high cholesterol or diabetes. Patient states that she does have a history of high blood pressure.  Medications:  Patient states that she does take medication to lower blood pressure. Patient states that she does not take medication to lower cholesterol or blood sugar. Patient does not take an aspirin a day to help prevent a heart attack or stroke. During the past 7 days patient has taken prescribed medication to lower blood pressure on 0 days.   Blood pressure, self measurement: Patient states that she does measure blood pressure from home because she does not have the equipment to do so.   Nutrition: Patient states that on average she eats 2 cups of fruit and 1 cup of vegetables per day. Patient states that she does not eat fish at least 2 times per week. Patient eats more than half servings of whole grains. Patient does not drink less than 36 ounces of beverages with added sugar weekly. Patient is currently watching sodium or salt intake. In the past 7 days patient has not had any drinks containing alcohol. On average patient does not drink any drinks containing alcohol.      Physical activity:  Patient states that she gets 30 minutes of moderate and 0 minutes of vigorous physical activity each week.  Smoking status:  Patient states that she quit smoking tobacco more than 12 months ago.   Quality of life:  Over the past 2 weeks patient states that she has not had any days where she has little interest or pleasure in doing things and several days where she has felt down, depressed or hopeless.    Risk reduction and counseling:    Heart Wise: Explained the Heart Wise program to the patient. Patient agreed to participate in  the program. Gave patient the BP logs that she will need to record her BP's on at home. Gave the patient a BP monitor and explained in detail how to self-measure BP. Also gave dates for check-in to the patient of when I would call to get her BP readings. Will call patient on Wednesday December 9th and Wednesday February 10th. Answered any questions that patient had regarding BP. Encouraged patient to start taking prescribed BP medication daily and at the same time every day. Patient stated that she does not like how her current BP medication makes her feel and that is why she has not been taking it regularly. Will refer patient for follow-up with Internal Medicine for follow-up.  Health Coaching: Encouraged patient to try and add more vegetables into daily diet. Talked about different heart healthy fish she can try like salmon, tuna or mackerel. Encouraged patient to try and reduce the amount of sodas that she drinks. Patient stated that she has switched from Dr. Malachi Bonds to Sprite. Spoke with patient about adding more exercise into daily routine and how that can help with lowering BP. Patient states that she would like to try and loose some weight.   Navigation:  I will notify patient of lab results.  Patient is aware of 2 more health coaching sessions and a follow up. Will call patient with follow-up appointment information for Internal Medicine once appointment has been scheduled.   Time: 35 minutes

## 2019-03-07 ENCOUNTER — Other Ambulatory Visit: Payer: Self-pay | Admitting: Obstetrics and Gynecology

## 2019-03-08 ENCOUNTER — Telehealth: Payer: Self-pay

## 2019-03-08 LAB — HGB A1C W/O EAG: Hgb A1c MFr Bld: 5.7 % — ABNORMAL HIGH (ref 4.8–5.6)

## 2019-03-08 LAB — LIPID PANEL W/O CHOL/HDL RATIO
Cholesterol, Total: 138 mg/dL (ref 100–199)
HDL: 30 mg/dL — ABNORMAL LOW (ref >39)
LDL Chol Calc (NIH): 87 mg/dL (ref 0–99)
Triglycerides: 115 mg/dL (ref 0–149)
VLDL Cholesterol Cal: 21 mg/dL (ref 5–40)

## 2019-03-08 LAB — GLUCOSE, RANDOM: Glucose: 100 mg/dL — ABNORMAL HIGH (ref 65–99)

## 2019-03-08 NOTE — Telephone Encounter (Signed)
Health coaching 2   Labs- 138 cholesterol , 87 LDL cholesterol , 115 triglycerides , 30 HDL cholesterol , 5.7 hemoglobin A1C , 100 mean plasma glucose  Patient understands and is aware of her lab results.   Goals-  Discussed lab results with patient and answered any questions patient had regarding results. Encouraged patient to add in more healthy fats into diet to raise HDL. Encouraged patient to watch her sugars and carbs since her hemoglobin A1C was slightly elevated and her glucose was borderline. Patient stated that she has been trying to make changes recently. Also encouraged patient to try and add more exercise into her daily routine. Checked in with patient to see how her at home blood pressure monitoring has been going. Patient stated that she has been doing her measurements and that she did not have any questions in regards to this. Informed the patient that I would call her back in a week to get her first 2 weeks of BP logs.   Navigation:  Patient is aware of 1 more health coaching sessions and a follow up. Patient is scheduled for follow-up at [place] on December 30th @ 3:30 pm  Time- 10 minutes

## 2019-03-17 ENCOUNTER — Telehealth (HOSPITAL_COMMUNITY): Payer: Self-pay

## 2019-03-17 NOTE — Telephone Encounter (Signed)
Attempted to call patient to get BP logs for [facility]. Patient stated that she was at work. Asked patient to call me back on Monday with those readings. Patient voiced understanding.

## 2019-03-29 ENCOUNTER — Telehealth (HOSPITAL_COMMUNITY): Payer: Self-pay

## 2019-03-29 NOTE — Telephone Encounter (Signed)
Called patient to get BP readings from the Heart Wise/[facility] program. Patient did not have readings with her. Patient stated that she would call me back on Monday 04/03/19 to give me her readings.

## 2019-04-05 ENCOUNTER — Other Ambulatory Visit: Payer: Self-pay

## 2019-04-05 ENCOUNTER — Ambulatory Visit: Payer: Self-pay | Attending: Family Medicine | Admitting: Family Medicine

## 2019-04-05 DIAGNOSIS — M62838 Other muscle spasm: Secondary | ICD-10-CM

## 2019-04-05 DIAGNOSIS — A5901 Trichomonal vulvovaginitis: Secondary | ICD-10-CM

## 2019-04-05 MED ORDER — METRONIDAZOLE 500 MG PO TABS: 500.0000 mg | ORAL_TABLET | Freq: Two times a day (BID) | ORAL | 0 refills | Status: DC

## 2019-04-05 MED ORDER — POTASSIUM CHLORIDE ER 10 MEQ PO TBCR: 10.0000 meq | EXTENDED_RELEASE_TABLET | Freq: Every day | ORAL | 3 refills | Status: DC

## 2019-04-05 MED ORDER — CYCLOBENZAPRINE HCL 10 MG PO TABS: 10.0000 mg | ORAL_TABLET | Freq: Every evening | ORAL | 1 refills | Status: DC | PRN

## 2019-04-05 NOTE — Progress Notes (Signed)
Virtual Visit via Telephone Note  I connected with Connie Herrera, on 04/05/2019 at 4:48 PM by telephone due to the COVID-19 pandemic and verified that I am speaking with the correct person using two identifiers.   Consent: I discussed the limitations, risks, security and privacy concerns of performing an evaluation and management service by telephone and the availability of in person appointments. I also discussed with the patient that there may be a patient responsible charge related to this service. The patient expressed understanding and agreed to proceed.   Location of Patient: Home  Location of Provider: Clinic   Persons participating in Telemedicine visit: Tanyia Hyre Farrington-CMA Dr. Margarita Rana     History of Present Illness: 43 year old female with a history of Hypertension who presents today for a follow up visit.   She complains of lower abdomen pain and is requesting Flagyl pills as she never completed her treatment for Trichomonas - states she lost the rest of her prescription. She was diagnosed in 01/22/19. Complains of muscle spasms when she gets up in a hurry, cough or performs a rapid motion. Her body cramps up and she has to make it wear off. This has been present for years and she is wondering why she has these.  Past Medical History:  Diagnosis Date  . Brain lesion   . Hypertension    Allergies  Allergen Reactions  . Hydrocodone Nausea And Vomiting    Patient states that she can take medication it just makes her have bad nausea    Current Outpatient Medications on File Prior to Visit  Medication Sig Dispense Refill  . acyclovir (ZOVIRAX) 200 MG capsule  TAKE 1 CAPSULE BY MOUTH AS DIRECTED. TAKE 4 CAPSULE TWICE DAILY FOR 5 DAYS FOR OUTBREAK, TAKE 2 CAPSULES TWICE DAILY FOR SUPPRESSION (Patient taking differently: Take 200 mg by mouth daily. ) 90 capsule 2  . hydrochlorothiazide (HYDRODIURIL) 25 MG tablet Take 1 tablet (25 mg total) by  mouth daily. 90 tablet 3  . ibuprofen (ADVIL,MOTRIN) 800 MG tablet Take 1 tablet (800 mg total) by mouth every 8 (eight) hours as needed. 60 tablet 1  . naproxen (NAPROSYN) 500 MG tablet Take 1 tablet (500 mg total) by mouth 2 (two) times daily. (Patient not taking: Reported on 02/08/2019) 30 tablet 0   No current facility-administered medications on file prior to visit.    Observations/Objective: Awake, alert, oriented x3 Not in acute distress  CMP Latest Ref Rng & Units 03/07/2019 02/01/2019 11/27/2013  Glucose 65 - 99 mg/dL 100(H) 123(H) 78  BUN 6 - 24 mg/dL - 9 8  Creatinine 0.57 - 1.00 mg/dL - 0.82 0.77  Sodium 134 - 144 mmol/L - 137 139  Potassium 3.5 - 5.2 mmol/L - 3.7 3.5  Chloride 96 - 106 mmol/L - 97 99  CO2 20 - 29 mmol/L - 27 22  Calcium 8.7 - 10.2 mg/dL - 9.6 8.8  Total Protein 6.0 - 8.5 g/dL - - 6.6  Total Bilirubin 0.0 - 1.2 mg/dL - - 0.3  Alkaline Phos 39 - 117 IU/L - - 75  AST 0 - 40 IU/L - - 13  ALT 0 - 32 IU/L - - 12     Assessment and Plan: 1. Muscle spasm Commenced on muscle relaxant Could also be secondary to hypokalemia given she is on a chronic diuretic and last potassium prior to initiation of HCTZ was 3.7 Commence Potassium Advised weight loss could reduce frequency of spasms - potassium chloride (  KLOR-CON 10) 10 MEQ tablet; Take 1 tablet (10 mEq total) by mouth daily.  Dispense: 30 tablet; Refill: 3 - cyclobenzaprine (FLEXERIL) 10 MG tablet; Take 1 tablet (10 mg total) by mouth at bedtime as needed for muscle spasms.  Dispense: 30 tablet; Refill: 1  2. Trichomonas vaginitis It is difficult to know if she has a recurrence or never actually completed course of treatment Partner needs treatment Test on cure in 4 weeks - metroNIDAZOLE (FLAGYL) 500 MG tablet; Take 1 tablet (500 mg total) by mouth 2 (two) times daily.  Dispense: 14 tablet; Refill: 0   Follow Up Instructions: Return in about 1 month (around 05/06/2019).    I discussed the assessment and  treatment plan with the patient. The patient was provided an opportunity to ask questions and all were answered. The patient agreed with the plan and demonstrated an understanding of the instructions.   The patient was advised to call back or seek an in-person evaluation if the symptoms worsen or if the condition fails to improve as anticipated.     I provided 16 minutes total of non-face-to-face time during this encounter including median intraservice time, reviewing previous notes, labs, imaging, medications, management and patient verbalized understanding.     Charlott Rakes, MD, FAAFP. [facility] [place], [place]   04/05/2019, 4:48 PM

## 2019-04-05 NOTE — Progress Notes (Signed)
Patient has been called and DOB has been verified. Patient has been screened and transferred to PCP to start phone visit.  Pain in lower abdomen.

## 2019-04-10 ENCOUNTER — Telehealth: Payer: Self-pay

## 2019-04-10 NOTE — Telephone Encounter (Signed)
Called patient to get BP readings from the [facility] program. Left name and number for patient to call back.

## 2019-04-24 ENCOUNTER — Other Ambulatory Visit: Payer: Self-pay | Admitting: Advanced Practice Midwife

## 2019-05-15 ENCOUNTER — Telehealth: Payer: Self-pay

## 2019-05-15 NOTE — Telephone Encounter (Signed)
Spoke with patient about Connie Herrera/ Heart [employer]. Patient stated that she had not been taking blood pressure at home because of family issues but wants to continue with the program. We decided together that she will start over with her daily measurements tomorrow on 05/16/19. Will call patient back on 05/31/19 to get her 1st 2 weeks of readings. Patient voiced understanding.

## 2019-05-31 ENCOUNTER — Telehealth: Payer: Self-pay

## 2019-05-31 NOTE — Telephone Encounter (Signed)
Left message for patient about getting blood pressure readings for the [facility] program. Left name and number for patient to call back.

## 2019-07-03 ENCOUNTER — Telehealth: Payer: Self-pay

## 2019-07-03 NOTE — Telephone Encounter (Signed)
Called patient to get BP readings for the [facility] program. Patient completed partial readings for the 2 week logging period. Encouraged patient to pick-up with the weekly reading at this point. Will check back in with patient in a couple of weeks to get her next readings. Patient voiced understanding.

## 2019-08-19 ENCOUNTER — Ambulatory Visit (HOSPITAL_COMMUNITY)
Admission: EM | Admit: 2019-08-19 | Discharge: 2019-08-19 | Disposition: A | Discharge status: Home / Self Care | Payer: No Typology Code available for payment source | Attending: Family Medicine | Admitting: Family Medicine

## 2019-08-19 ENCOUNTER — Encounter (HOSPITAL_COMMUNITY): Payer: Self-pay | Admitting: *Deleted

## 2019-08-19 ENCOUNTER — Other Ambulatory Visit: Payer: Self-pay

## 2019-08-19 DIAGNOSIS — W57XXXA Bitten or stung by nonvenomous insect and other nonvenomous arthropods, initial encounter: Secondary | ICD-10-CM

## 2019-08-19 DIAGNOSIS — S80861A Insect bite (nonvenomous), right lower leg, initial encounter: Secondary | ICD-10-CM

## 2019-08-19 MED ORDER — DOXYCYCLINE HYCLATE 100 MG PO CAPS: 100.0000 mg | ORAL_CAPSULE | Freq: Two times a day (BID) | ORAL | 0 refills | Status: AC

## 2019-08-19 NOTE — ED Triage Notes (Signed)
Reports feeling sensation of a bite to right lower leg 2 days ago.  Since then, area has become red and swollen.  Denies fevers.

## 2019-08-19 NOTE — ED Provider Notes (Signed)
[place]    CSN: [id number] Arrival date & time: 08/19/19  1001      History   Chief Complaint Chief Complaint  Patient presents with  . Leg Pain    HPI Connie Herrera is a 44 y.o. female.   Patient is a 44 year old female presents today with possible spider bite to right lower extremity.  Reporting erythema, swelling and pain.  Symptoms have been constant worsening over the past 2 days.  Increase swelling.  She has not done anything to treat the symptoms.  Denies any fever, chills, body aches or night sweats.  ROS per HPI      Past Medical History:  Diagnosis Date  . Brain lesion   . Hypertension     Patient Active Problem List   Diagnosis Date Noted  . Screening breast examination 02/07/2019  . Breast pain 02/07/2019  . Hypertension 01/26/2019  . Paresthesias 08/04/2013  . Breast pain, right 03/11/2012    Past Surgical History:  Procedure Laterality Date  . TUBAL LIGATION      OB History    Gravida  4   Para  4   Term  4   Preterm      AB      Living  4     SAB      TAB      Ectopic      Multiple      Live Births  4            Home Medications    Prior to Admission medications   Medication Sig Start Date End Date Taking? Authorizing Provider  acyclovir (ZOVIRAX) 200 MG capsule Take 4 capsules twice a day x 5 days for outbreak or 2 capsules 2 x daily for suppression. 04/24/19  Yes Smith, [place], CNM  hydrochlorothiazide (HYDRODIURIL) 25 MG tablet Take 1 tablet (25 mg total) by mouth daily. 01/26/19  Yes Freeman Caldron M, PA-C  potassium chloride (KLOR-CON 10) 10 MEQ tablet Take 1 tablet (10 mEq total) by mouth daily. 04/05/19  Yes Charlott Rakes, MD  doxycycline (VIBRAMYCIN) 100 MG capsule Take 1 capsule (100 mg total) by mouth 2 (two) times daily for 7 days. 08/19/19 08/26/19  Loura Halt A, NP  ibuprofen (ADVIL,MOTRIN) 800 MG tablet Take 1 tablet (800 mg total) by mouth every 8 (eight) hours as needed.  11/27/15   Leftwich-Kirby, Kathie Dike, CNM    Family History Family History  Problem Relation Age of Onset  . Healthy Mother   . Healthy Father   . Cancer Maternal Grandmother 78       breast    Social History Social History   Tobacco Use  . Smoking status: Former Smoker    Packs/day: 0.50    Years: 4.00    Pack years: 2.00    Types: Cigarettes    Quit date: 06/04/2017    Years since quitting: 2.2  . Smokeless tobacco: Never Used  Substance Use Topics  . Alcohol use: No  . Drug use: No     Allergies   Hydrocodone   Review of Systems Review of Systems   Physical Exam Triage Vital Signs ED Triage Vitals  Enc Vitals Group     BP 08/19/19 1009 (!) 148/91     Pulse Rate 08/19/19 1009 (!) 102     Resp 08/19/19 1009 18     Temp 08/19/19 1009 97.9 F (36.6 C)     Temp Source 08/19/19 1009 Oral  SpO2 08/19/19 1009 96 %     Weight --      Height --      Head Circumference --      Peak Flow --      Pain Score 08/19/19 1011 6     Pain Loc --      Pain Edu? --      Excl. in [place]? --    No data found.  Updated Vital Signs BP (!) 148/91 Comment: takes meds; hasn't had today  Pulse (!) 102   Temp 97.9 F (36.6 C) (Oral)   Resp 18   LMP 08/12/2019 (Approximate)   SpO2 96%   Visual Acuity Right Eye Distance:   Left Eye Distance:   Bilateral Distance:    Right Eye Near:   Left Eye Near:    Bilateral Near:     Physical Exam Vitals and nursing note reviewed.  Constitutional:      General: She is not in acute distress.    Appearance: Normal appearance. She is not ill-appearing, toxic-appearing or diaphoretic.  HENT:     Head: Normocephalic.     Nose: Nose normal.  Eyes:     Conjunctiva/sclera: Conjunctivae normal.  Pulmonary:     Effort: Pulmonary effort is normal.  Musculoskeletal:        General: Normal range of motion.     Cervical back: Normal range of motion.  Skin:    General: Skin is warm and dry.     Findings: No rash.     Comments: See  picture for detail   Neurological:     Mental Status: She is alert.  Psychiatric:        Mood and Affect: Mood normal.      UC Treatments / Results  Labs (all labs ordered are listed, but only abnormal results are displayed) Labs Reviewed - No data to display  EKG   Radiology No results found.  Procedures   Procedures (including critical care time)  Medications Ordered in UC Medications - No data to display  Initial Impression / Assessment and Plan / UC Course  I have reviewed the triage vital signs and the nursing notes.  Pertinent labs & imaging results that were available during my care of the patient were reviewed by me and considered in my medical decision making (see chart for details).     Insect bite We will go ahead and cover with antibiotics today based on appearance and worsening redness and swelling of the lower extremity. Recommended warm compresses Follow up as needed for continued or worsening symptoms  Final Clinical Impressions(s) / UC Diagnoses   Final diagnoses:  Insect bite of right lower leg, initial encounter     Discharge Instructions     Take the antibiotics as prescribed.  Warm compresses.  Follow-up for any continued or worsening problems.    ED Prescriptions    Medication Sig Dispense Auth. Provider   doxycycline (VIBRAMYCIN) 100 MG capsule Take 1 capsule (100 mg total) by mouth 2 (two) times daily for 7 days. 14 capsule Gianah Batt A, NP     PDMP not reviewed this encounter.   Orvan July, NP 08/19/19 1032

## 2019-08-19 NOTE — Discharge Instructions (Signed)
Take the antibiotics as prescribed.  Warm compresses.  Follow-up for any continued or worsening problems.

## 2019-08-20 ENCOUNTER — Telehealth (HOSPITAL_COMMUNITY): Payer: Self-pay | Admitting: *Deleted

## 2019-08-20 NOTE — Telephone Encounter (Signed)
Returning pt call.  Pt reports worsening pain, states the redness is spreading, requesting hydrocodone w/ nausea med.  Pt confirms she started taking the abx yesterday.  Informed pt that anyone that has already started the abx and has significantly worsening pain and/or redness that continues spreading needs to be re-evaluated.  Pt verbalized understanding.

## 2019-09-25 ENCOUNTER — Other Ambulatory Visit: Payer: Self-pay | Admitting: Advanced Practice Midwife

## 2019-11-12 ENCOUNTER — Other Ambulatory Visit: Payer: Self-pay | Admitting: Advanced Practice Midwife

## 2019-12-07 ENCOUNTER — Other Ambulatory Visit: Payer: Self-pay

## 2019-12-07 DIAGNOSIS — Z1231 Encounter for screening mammogram for malignant neoplasm of breast: Secondary | ICD-10-CM

## 2020-01-09 ENCOUNTER — Encounter: Payer: Self-pay | Admitting: Nurse Practitioner

## 2020-01-09 ENCOUNTER — Ambulatory Visit (INDEPENDENT_AMBULATORY_CARE_PROVIDER_SITE_OTHER): Payer: Self-pay | Admitting: Nurse Practitioner

## 2020-01-09 ENCOUNTER — Other Ambulatory Visit (HOSPITAL_COMMUNITY)
Admission: RE | Admit: 2020-01-09 | Discharge: 2020-01-09 | Disposition: A | Discharge status: Home / Self Care | Payer: No Typology Code available for payment source | Source: Ambulatory Visit | Attending: Nurse Practitioner | Admitting: Nurse Practitioner

## 2020-01-09 ENCOUNTER — Other Ambulatory Visit: Payer: Self-pay

## 2020-01-09 VITALS — BP 149/86 | HR 89 | Ht 67.0 in | Wt 214.6 lb

## 2020-01-09 DIAGNOSIS — I10 Essential (primary) hypertension: Secondary | ICD-10-CM

## 2020-01-09 DIAGNOSIS — N920 Excessive and frequent menstruation with regular cycle: Secondary | ICD-10-CM

## 2020-01-09 DIAGNOSIS — Z01419 Encounter for gynecological examination (general) (routine) without abnormal findings: Secondary | ICD-10-CM | POA: Insufficient documentation

## 2020-01-09 DIAGNOSIS — Z6833 Body mass index (BMI) 33.0-33.9, adult: Secondary | ICD-10-CM

## 2020-01-09 DIAGNOSIS — E6609 Other obesity due to excess calories: Secondary | ICD-10-CM | POA: Insufficient documentation

## 2020-01-09 DIAGNOSIS — Z6834 Body mass index (BMI) 34.0-34.9, adult: Secondary | ICD-10-CM | POA: Insufficient documentation

## 2020-01-09 NOTE — Progress Notes (Signed)
GYNECOLOGY ANNUAL PREVENTATIVE CARE ENCOUNTER NOTE  Subjective:   Connie Herrera is a 44 y.o. [phone] female here for a routine annual gynecologic exam.  Current complaints: heavy bleeding with menses for 4-5 days.  Gets up at night 5-6 times to change pad.  Feels like blood is pouring out.  Having dizziness occasionally.   Denies abnormal vaginal bleeding, discharge, pelvic pain, problems with intercourse or other gynecologic concerns.    Gynecologic History Patient's last menstrual period was 12/30/2019 (exact date). Contraception: tubal ligation Last Pap: 2017. Results were: normal Last mammogram: 02-2019. Results were: normal  Obstetric History OB History  Gravida Para Term Preterm AB Living  4 4 4  0 0 4  SAB TAB Ectopic Multiple Live Births  0 0 0 0 4    # Outcome Date GA Lbr Len/2nd Weight Sex Delivery Anes PTL Lv  4 Term      Vag-Spont   LIV  3 Term      Vag-Spont   LIV  2 Term      Vag-Spont   LIV  1 Term      Vag-Spont   LIV    Past Medical History:  Diagnosis Date  . Brain lesion   . Hypertension     Past Surgical History:  Procedure Laterality Date  . TUBAL LIGATION      Current Outpatient Medications on File Prior to Visit  Medication Sig Dispense Refill  . acyclovir (ZOVIRAX) 200 MG capsule TAKE 4 CAPSULES BY MOUTH TWICE DAILY FOR 5 DAYS FOR OUTBREAK OR 2 CAPSULES TWICE DAILY FOR SUPPRESSION. (Patient not taking: Reported on 01/09/2020) 90 capsule 0  . hydrochlorothiazide (HYDRODIURIL) 25 MG tablet Take 1 tablet (25 mg total) by mouth daily. (Patient not taking: Reported on 01/09/2020) 90 tablet 3  . ibuprofen (ADVIL,MOTRIN) 800 MG tablet Take 1 tablet (800 mg total) by mouth every 8 (eight) hours as needed. (Patient not taking: Reported on 01/09/2020) 60 tablet 1  . potassium chloride (KLOR-CON 10) 10 MEQ tablet Take 1 tablet (10 mEq total) by mouth daily. (Patient not taking: Reported on 01/09/2020) 30 tablet 3   No current facility-administered  medications on file prior to visit.    Allergies  Allergen Reactions  . Hydrocodone Nausea And Vomiting    Patient states that she can take medication it just makes her have bad nausea    Social History   Socioeconomic History  . Marital status: Single    Spouse name: Not on file  . Number of children: 4  . Years of education: 51  . Highest education level: Some college, no degree  Occupational History    Employer: [employer]  Tobacco Use  . Smoking status: Former Smoker    Packs/day: 0.50    Years: 4.00    Pack years: 2.00    Types: Cigarettes    Quit date: 06/04/2017    Years since quitting: 2.6  . Smokeless tobacco: Never Used  Vaping Use  . Vaping Use: Never used  Substance and Sexual Activity  . Alcohol use: No  . Drug use: No  . Sexual activity: Never    Birth control/protection: Surgical  Other Topics Concern  . Not on file  Social History Narrative   Patient lives at home with children.    Patient has 4 children.    Patient is single.    Patient has 2 years of college.    Patient is right handed.    Patient is  working full time. [employer].    Social Determinants of Health   Financial Resource Strain:   . Difficulty of Paying Living Expenses: Not on file  Food Insecurity: No Food Insecurity  . Worried About Charity fundraiser in the Last Year: Never true  . Ran Out of Food in the Last Year: Never true  Transportation Needs: No Transportation Needs  . Lack of Transportation (Medical): No  . Lack of Transportation (Non-Medical): No  Physical Activity:   . Days of Exercise per Week: Not on file  . Minutes of Exercise per Session: Not on file  Stress:   . Feeling of Stress : Not on file  Social Connections:   . Frequency of Communication with Friends and Family: Not on file  . Frequency of Social Gatherings with Friends and Family: Not on file  . Attends Religious Services: Not on file  . Active Member of Clubs or  Organizations: Not on file  . Attends Archivist Meetings: Not on file  . Marital Status: Not on file  Intimate Partner Violence:   . Fear of Current or Ex-Partner: Not on file  . Emotionally Abused: Not on file  . Physically Abused: Not on file  . Sexually Abused: Not on file    Family History  Problem Relation Age of Onset  . Healthy Mother   . Healthy Father   . Cancer Maternal Grandmother 56       breast    The following portions of the patient's history were reviewed and updated as appropriate: allergies, current medications, past family history, past medical history, past social history, past surgical history and problem list.  Review of Systems Pertinent items noted in HPI and remainder of comprehensive ROS otherwise negative.   Objective:  BP (!) 149/86   Pulse 89   Ht 5\' 7"  (1.702 m)   Wt 214 lb 9.6 oz (97.3 kg)   LMP 12/30/2019 (Exact Date)   BMI 33.61 kg/m  CONSTITUTIONAL: Well-developed, well-nourished female in no acute distress.  HENT:  Normocephalic, atraumatic, External right and left ear normal.  EYES: Conjunctivae and EOM are normal. Pupils are equal, round.  No scleral icterus.  NECK: Normal range of motion, supple, no masses.  Normal thyroid.  SKIN: Skin is warm and dry. No rash noted. Not diaphoretic. No erythema. No pallor. NEUROLOGIC: Alert and oriented to person, place, and time. Normal reflexes, muscle tone coordination. No cranial nerve deficit noted. PSYCHIATRIC: Normal mood and affect. Normal behavior. Normal judgment and thought content. CARDIOVASCULAR: Normal heart rate noted, regular rhythm RESPIRATORY: Clear to auscultation bilaterally. Effort and breath sounds normal, no problems with respiration noted. BREASTS: Symmetric in size. No masses, skin changes, nipple drainage, or lymphadenopathy. ABDOMEN: Soft, no distention noted.  No tenderness, rebound or guarding.  PELVIC: Normal appearing external genitalia; normal appearing vaginal  mucosa.  Cervix very difficult to fully visualize.  No abnormal discharge noted.  Pap smear obtained.  Normal uterine size, no other palpable masses, no uterine or adnexal tenderness. MUSCULOSKELETAL: Normal range of motion. No tenderness.  No cyanosis, clubbing, or edema.    Assessment and Plan:  1. Well woman exam with routine gynecological exam Has had one covid vaccine of a 2 shot series.  Will bring card to visit next time so date can be entered into her record.  Encouraged to have second shot ASAP.  Had her vaccine at [facility]. Has new insurance and will call the office with her insurance info. Wants  all testing done today.  - Cytology - PAP( Axis) - HIV Antibody (routine testing w rflx) - Hepatitis C Antibody - Hepatitis B Surface AntiGEN - RPR  2. BMI 33.0-33.9,adult Increased exercise and healthy diet encouraged  3. Menorrhagia with regular cycle Has had increased bleeding for the past one year.  Has family history of fibroids and several relatives have had hysterectomies.  - CBC - Comprehensive metabolic panel - US PELVIC COMPLETE WITH TRANSVAGINAL; Future  4.  Hypertension Takes presribed medication from time to time.  Given list of PCP practices so she can return for management of her hypertension. Nonsmoker.  Will follow up results of pap smear and manage accordingly. Mammogram already scheduled for Feb 15, 2020 Routine preventative health maintenance measures emphasized. Please refer to After Visit Summary for other counseling recommendations.    Earlie Server, RN, MSN, NP-BC Nurse Practitioner, [place] [facility]

## 2020-01-09 NOTE — Progress Notes (Signed)
GYN [place] scheduled for 01/22/20 @ 3pm.

## 2020-01-10 ENCOUNTER — Encounter: Payer: Self-pay | Admitting: Nurse Practitioner

## 2020-01-10 DIAGNOSIS — D500 Iron deficiency anemia secondary to blood loss (chronic): Secondary | ICD-10-CM | POA: Insufficient documentation

## 2020-01-10 LAB — COMPREHENSIVE METABOLIC PANEL
ALT: 17 IU/L (ref 0–32)
AST: 13 IU/L (ref 0–40)
Albumin/Globulin Ratio: 1.5 (ref 1.2–2.2)
Albumin: 4.3 g/dL (ref 3.8–4.8)
Alkaline Phosphatase: 81 IU/L (ref 44–121)
BUN/Creatinine Ratio: 10 (ref 9–23)
BUN: 9 mg/dL (ref 6–24)
Bilirubin Total: <0.2 mg/dL (ref 0.0–1.2)
CO2: 23 mmol/L (ref 20–29)
Calcium: 8.8 mg/dL (ref 8.7–10.2)
Chloride: 102 mmol/L (ref 96–106)
Creatinine, Ser: 0.86 mg/dL (ref 0.57–1.00)
GFR calc Af Amer: 96 mL/min/{1.73_m2} (ref >59)
GFR calc non Af Amer: 83 mL/min/{1.73_m2} (ref >59)
Globulin, Total: 2.8 g/dL (ref 1.5–4.5)
Glucose: 81 mg/dL (ref 65–99)
Potassium: 3.7 mmol/L (ref 3.5–5.2)
Sodium: 140 mmol/L (ref 134–144)
Total Protein: 7.1 g/dL (ref 6.0–8.5)

## 2020-01-10 LAB — CBC
Hematocrit: 32.8 % — ABNORMAL LOW (ref 34.0–46.6)
Hemoglobin: 10.6 g/dL — ABNORMAL LOW (ref 11.1–15.9)
MCH: 26.5 pg — ABNORMAL LOW (ref 26.6–33.0)
MCHC: 32.3 g/dL (ref 31.5–35.7)
MCV: 82 fL (ref 79–97)
Platelets: 436 10*3/uL (ref 150–450)
RBC: 4 x10E6/uL (ref 3.77–5.28)
RDW: 13.8 % (ref 11.7–15.4)
WBC: 6.4 10*3/uL (ref 3.4–10.8)

## 2020-01-10 LAB — HIV ANTIBODY (ROUTINE TESTING W REFLEX): HIV Screen 4th Generation wRfx: NONREACTIVE

## 2020-01-10 LAB — HEPATITIS C ANTIBODY: Hep C Virus Ab: 0.1 s/co ratio (ref 0.0–0.9)

## 2020-01-10 LAB — HEPATITIS B SURFACE ANTIGEN: Hepatitis B Surface Ag: NEGATIVE

## 2020-01-10 LAB — RPR: RPR Ser Ql: NONREACTIVE

## 2020-01-11 LAB — CYTOLOGY - PAP
Chlamydia: NEGATIVE
Comment: NEGATIVE
Comment: NEGATIVE
Comment: NEGATIVE
Comment: NORMAL
Diagnosis: NEGATIVE
High risk HPV: NEGATIVE
Neisseria Gonorrhea: NEGATIVE
Trichomonas: NEGATIVE

## 2020-01-22 ENCOUNTER — Other Ambulatory Visit: Payer: Self-pay

## 2020-01-22 ENCOUNTER — Ambulatory Visit
Admission: RE | Admit: 2020-01-22 | Discharge: 2020-01-22 | Disposition: A | Discharge status: Home / Self Care | Payer: No Typology Code available for payment source | Source: Ambulatory Visit | Attending: Nurse Practitioner | Admitting: Nurse Practitioner

## 2020-01-22 DIAGNOSIS — N920 Excessive and frequent menstruation with regular cycle: Secondary | ICD-10-CM | POA: Insufficient documentation

## 2020-01-25 ENCOUNTER — Other Ambulatory Visit: Payer: Self-pay

## 2020-01-25 DIAGNOSIS — Z01419 Encounter for gynecological examination (general) (routine) without abnormal findings: Secondary | ICD-10-CM

## 2020-01-25 MED ORDER — ACYCLOVIR 200 MG PO CAPS: ORAL_CAPSULE | ORAL | 0 refills | Status: DC

## 2020-01-29 ENCOUNTER — Telehealth: Payer: Self-pay | Admitting: *Deleted

## 2020-01-29 NOTE — Telephone Encounter (Signed)
Connie Herrera left a message this afternoon she has been waiting for someone to call her with results. Emaly Boschert,RN

## 2020-01-30 NOTE — Telephone Encounter (Signed)
Patient wanting results of most recent [place]. [place] not read yet by Earlie Server, NP.   Read report to patient and informed her the provider needs to review to make recommendations for follow up. Patient voiced understanding. Message forwarded to Earlie Server, NP

## 2020-02-03 ENCOUNTER — Other Ambulatory Visit: Payer: Self-pay | Admitting: Physician Assistant

## 2020-02-03 DIAGNOSIS — I10 Essential (primary) hypertension: Secondary | ICD-10-CM

## 2020-02-03 NOTE — Telephone Encounter (Signed)
Requested medication (s) are due for refill today: yes  Requested medication (s) are on the active medication list: yes  Last refill:  01/26/19  Future visit scheduled: no  Notes to clinic:  overdue for appt- called pt and LM on VM to call office and schedule appt   Requested Prescriptions  Pending Prescriptions Disp Refills   hydrochlorothiazide (HYDRODIURIL) 25 MG tablet [Pharmacy Med Name: HYDROCHLOROTHIAZIDE 25MG  TABLETS] 90 tablet 3    Sig: TAKE 1 TABLET(25 MG) BY MOUTH DAILY      Cardiovascular: Diuretics - Thiazide Failed - 02/03/2020  9:28 AM      Failed - Last BP in normal range    BP Readings from Last 1 Encounters:  01/09/20 (!) 149/86          Failed - Valid encounter within last 6 months    Recent Outpatient Visits           10 months ago Muscle spasm   [place], Charlane Ferretti, [place]   1 year ago Trichomonas vaginitis   [place], Levada Dy M, [place]              Passed - Ca in normal range and within 360 days    Calcium  Date Value Ref Range Status  01/09/2020 8.8 8.7 - 10.2 mg/dL Final   Calcium, Ion  Date Value Ref Range Status  01/11/2008 1.15  Final          Passed - Cr in normal range and within 360 days    Creatinine, Ser  Date Value Ref Range Status  01/09/2020 0.86 0.57 - 1.00 mg/dL Final          Passed - K in normal range and within 360 days    Potassium  Date Value Ref Range Status  01/09/2020 3.7 3.5 - 5.2 mmol/L Final          Passed - Na in normal range and within 360 days    Sodium  Date Value Ref Range Status  01/09/2020 140 134 - 144 mmol/L Final

## 2020-02-06 ENCOUNTER — Ambulatory Visit: Payer: No Typology Code available for payment source | Admitting: Obstetrics & Gynecology

## 2020-02-06 ENCOUNTER — Ambulatory Visit: Payer: No Typology Code available for payment source | Admitting: Nurse Practitioner

## 2020-02-12 ENCOUNTER — Telehealth: Payer: Self-pay | Admitting: *Deleted

## 2020-02-12 ENCOUNTER — Ambulatory Visit: Payer: No Typology Code available for payment source | Admitting: Obstetrics & Gynecology

## 2020-02-12 NOTE — Telephone Encounter (Signed)
Patient called with question re: her appointment today and wanting to know what it is.  I spoke with her and explained what an endometrial biopsy is and why it is done. She voices understanding and states she wants to reschedule. Call transferred to registrar to reschedule. Ky Rumple,RN

## 2020-02-15 ENCOUNTER — Other Ambulatory Visit: Payer: Self-pay | Admitting: Nurse Practitioner

## 2020-02-15 ENCOUNTER — Other Ambulatory Visit: Payer: Self-pay

## 2020-02-15 ENCOUNTER — Ambulatory Visit
Admission: RE | Admit: 2020-02-15 | Discharge: 2020-02-15 | Disposition: A | Discharge status: Home / Self Care | Payer: No Typology Code available for payment source | Source: Ambulatory Visit | Attending: Nurse Practitioner | Admitting: Nurse Practitioner

## 2020-02-15 ENCOUNTER — Ambulatory Visit: Payer: No Typology Code available for payment source

## 2020-02-15 DIAGNOSIS — Z1231 Encounter for screening mammogram for malignant neoplasm of breast: Secondary | ICD-10-CM

## 2020-03-04 ENCOUNTER — Ambulatory Visit (INDEPENDENT_AMBULATORY_CARE_PROVIDER_SITE_OTHER): Payer: Self-pay | Admitting: Obstetrics and Gynecology

## 2020-03-04 ENCOUNTER — Other Ambulatory Visit: Payer: Self-pay

## 2020-03-04 ENCOUNTER — Other Ambulatory Visit (HOSPITAL_COMMUNITY)
Admission: RE | Admit: 2020-03-04 | Discharge: 2020-03-04 | Disposition: A | Discharge status: Home / Self Care | Payer: Medicaid Other | Source: Ambulatory Visit | Attending: Obstetrics and Gynecology | Admitting: Obstetrics and Gynecology

## 2020-03-04 ENCOUNTER — Encounter: Payer: Self-pay | Admitting: Obstetrics and Gynecology

## 2020-03-04 VITALS — BP 147/98 | HR 97 | Wt 217.6 lb

## 2020-03-04 DIAGNOSIS — N920 Excessive and frequent menstruation with regular cycle: Secondary | ICD-10-CM | POA: Diagnosis not present

## 2020-03-04 DIAGNOSIS — D500 Iron deficiency anemia secondary to blood loss (chronic): Secondary | ICD-10-CM

## 2020-03-04 DIAGNOSIS — Z3202 Encounter for pregnancy test, result negative: Secondary | ICD-10-CM

## 2020-03-04 LAB — POCT PREGNANCY, URINE: Preg Test, Ur: NEGATIVE

## 2020-03-04 MED ORDER — TRANEXAMIC ACID 650 MG PO TABS: 1300.0000 mg | ORAL_TABLET | Freq: Three times a day (TID) | ORAL | 2 refills | Status: DC

## 2020-03-04 NOTE — Progress Notes (Signed)
ENDOMETRIAL BIOPSY      Connie Herrera is a 44 y.o. [id number], SVD x 4 here for endometrial biopsy.  As this was first time seeing the patient, I reviewed the chart.  She has had heavy cycles x 1 year.  Menses are regular and lasts for 8 days.  The first 4-5 days are heavy.  The pelvic ultrasound was reviewed and showed two dominant fibroids.  CLINICAL DATA:  Menorrhagia   EXAM: TRANSABDOMINAL AND TRANSVAGINAL ULTRASOUND OF PELVIS   TECHNIQUE: Both transabdominal and transvaginal ultrasound examinations of the pelvis were performed. Transabdominal technique was performed for global imaging of the pelvis including uterus, ovaries, adnexal regions, and pelvic cul-de-sac. It was necessary to proceed with endovaginal exam following the transabdominal exam to visualize the endometrial stripe.   COMPARISON:  None   FINDINGS: Uterus   Measurements: At least 12.6 x 0.7 x 12.1 cm = volume: 697 mL. There are multiple intrauterine masses identified. The largest is seen within the left uterine corpus measuring at least 8.8 x 7.6 x 9.0 cm in size. These heterogeneously hypoechoic lobulated masses are compatible with multiple intrauterine fibroids. A submucosal 2.6 cm fibroid is seen projecting into the endometrial cavity. The endometrial cavity is deviated into the right uterus and appears slightly distorted secondary to mass effect from multiple uterine fibroids. The cervix is unremarkable.   Endometrium   Thickness: 14 mm. There is fluid seen within the endometrial cavity measuring up to 4 mm in thickness.   Right ovary   Measurements: 4.9 x 2.2 x 2.8 cm = volume: 16 mL. Multiple follicles are seen within the right ovary. A probable involuting corpus luteum is seen within the right ovary. No solid masses are identified. No adnexal masses are seen.   Left ovary   Not clearly identified. There is suggestion of a normal appearing left ovary seen transabdominally, best on image  # 61 and 62, however, this is not definitive. No adnexal masses are identified.   Other findings   There is trace free fluid seen within the pelvis surrounding the right ovary   IMPRESSION: Fibroid uterus with numerous fibroids with several noted to be submucosal and distorting the endometrial stripe.   Small fluid within the endometrial cavity. This may relate to the patient's phase of menstruation and a follow-up sonogram in 6-12 weeks, in the medial post menstrual phase, may be helpful in documenting resolution.   No definite visualization of the left ovary.  No adnexal masses   The indications for endometrial biopsy were reviewed.  Risks of the biopsy including cramping, bleeding, infection, uterine perforation, inadequate specimen and need for additional procedures were discussed. The patient states she understands and agrees to undergo procedure today. Consent was signed. Time out was performed.   Indications: menorrhagia Urine HCG: negative  A bivalve speculum was placed into the vagina and the cervix was easily visualized and was prepped with Betadine x2. A single-toothed tenaculum was placed on the anterior lip of the cervix to stabilize it. The 3 mm pipelle was introduced into the endometrial cavity without difficulty to a depth of 11 cm, and a moderate amount of tissue was obtained and sent to pathology. This was repeated for a total of 3 passes. The instruments were removed from the patient's vagina. Minimal bleeding from the cervix at the tenaculum was noted.   The patient tolerated the procedure well. Routine post-procedure instructions were given to the patient.    Information given on [place], fibroids and hysteroscopy.  At this time, the patient does not want hysterectomy.  Current plan if endometrial biopsy is negative would be lysteda for now, rx has been given,and then hysteroscopic fibroid resection followed by uterine artery embolization.  Pt will be seen for preop  before the procedure.  It has been emphasized to improve blood pressure control before the procedure.   Lynnda Shields, MD Faculty Attending, [facility] for [facility]

## 2020-03-04 NOTE — Patient Instructions (Addendum)
Hysteroscopy Hysteroscopy is a procedure that is used to examine the inside of a woman's womb (uterus). This may be done for various reasons, including:  To look for lumps (tumors) and other growths in the uterus.  To evaluate abnormal bleeding, fibroid tumors, polyps, scar tissue (adhesions), or cancer of the uterus.  To determine the cause of an inability to get pregnant (infertility) or repeated losses of pregnancies (miscarriages).  To find a lost IUD (intrauterine device).  To perform a procedure that permanently prevents pregnancy (sterilization). During this procedure, a thin, flexible tube with a small light and camera (hysteroscope) is used to examine the uterus. The camera sends images to a monitor in the room so that your health care provider can view the inside of your uterus. A hysteroscopy should be done right after a menstrual period to make sure that you are not pregnant. Tell a health care provider about:  Any allergies you have.  All medicines you are taking, including vitamins, herbs, eye drops, creams, and over-the-counter medicines.  Any problems you or family members have had with the use of anesthetic medicines.  Any blood disorders you have.  Any surgeries you have had.  Any medical conditions you have.  Whether you are pregnant or may be pregnant. What are the risks? Generally, this is a safe procedure. However, problems may occur, including:  Excessive bleeding.  Infection.  Damage to the uterus or other structures or organs.  Allergic reaction to medicines or fluids that are used in the procedure. What happens before the procedure? Staying hydrated Follow instructions from your health care provider about hydration, which may include:  Up to 2 hours before the procedure - you may continue to drink clear liquids, such as water, clear fruit juice, black coffee, and plain tea. Eating and drinking restrictions Follow instructions from your health care  provider about eating and drinking, which may include:  8 hours before the procedure - stop eating solid foods and drink clear liquids only  2 hours before the procedure - stop drinking clear liquids. General instructions  Ask your health care provider about: ? Changing or stopping your normal medicines. This is important if you take diabetes medicines or blood thinners. ? Taking medicines such as aspirin and ibuprofen. These medicines can thin your blood and cause bleeding. Do not take these medicines for 1 week before your procedure, or as told by your health care provider.  Do not use any products that contain nicotine or tobacco for 2 weeks before the procedure. This includes cigarettes and e-cigarettes. If you need help quitting, ask your health care provider.  Medicine may be placed in your cervix the day before the procedure. This medicine causes the cervix to have a larger opening (dilate). The larger opening makes it easier for the hysteroscope to be inserted into the uterus during the procedure.  Plan to have someone with you for the first 24-48 hours after the procedure, especially if you are given a medicine to make you fall asleep (general anesthetic).  Plan to have someone take you home from the hospital or clinic. What happens during the procedure?  To lower your risk of infection: ? Your health care team will wash or sanitize their hands. ? Your skin will be washed with soap. ? Hair may be removed from the surgical area.  An IV tube will be inserted into one of your veins.  You may be given one or more of the following: ? A medicine to help  you relax (sedative). ? A medicine that numbs the area around the cervix (local anesthetic). ? A medicine to make you fall asleep (general anesthetic).  A hysteroscope will be inserted through your vagina and into your uterus.  Air or fluid will be used to enlarge your uterus, enabling your health care provider to see your uterus  better. The amount of fluid used will be carefully checked throughout the procedure.  In some cases, tissue may be gently scraped from inside the uterus and sent to a lab for testing (biopsy). The procedure may vary among health care providers and hospitals. What happens after the procedure?  Your blood pressure, heart rate, breathing rate, and blood oxygen level will be monitored until the medicines you were given have worn off.  You may have some cramping. You may be given medicines for this.  You may have bleeding, which varies from light spotting to menstrual-like bleeding. This is normal.  If you had a biopsy done, it is your responsibility to get the results of your procedure. Ask your health care provider, or the department performing the procedure, when your results will be ready. Summary  Hysteroscopy is a procedure that is used to examine the inside of a woman's womb (uterus).  After the procedure, you may have bleeding, which varies from light spotting to menstrual-like bleeding. This is normal. You may also have cramping.  Plan to have someone take you home from the hospital or clinic. This information is not intended to replace advice given to you by your health care provider. Make sure you discuss any questions you have with your health care provider. Document Revised: 03/05/2017 Document Reviewed: 04/21/2016 Elsevier Patient Education  2020 [place]. Endometrial Biopsy  Endometrial biopsy is a procedure in which a tissue sample is taken from inside the uterus. The sample is taken from the endometrium, which is the lining of the uterus. The tissue sample is then checked under a microscope to see if the tissue is normal or abnormal. This procedure helps to determine where you are in your menstrual cycle and how hormone levels are affecting the lining of the uterus. This procedure may also be used to evaluate uterine bleeding or to diagnose endometrial cancer, endometrial  tuberculosis, polyps, or other inflammatory conditions. Tell a health care provider about:  Any allergies you have.  All medicines you are taking, including vitamins, herbs, eye drops, creams, and over-the-counter medicines.  Any problems you or family members have had with anesthetic medicines.  Any blood disorders you have.  Any surgeries you have had.  Any medical conditions you have.  Whether you are pregnant or may be pregnant. What are the risks? Generally, this is a safe procedure. However, problems may occur, including:  Bleeding.  Pelvic infection.  Puncture of the wall of the uterus with the biopsy device (rare). What happens before the procedure?  Keep a record of your menstrual cycles as told by your health care provider. You may need to schedule your procedure for a specific time in your cycle.  You may want to bring a sanitary pad to wear after the procedure.  Ask your health care provider about: ? Changing or stopping your regular medicines. This is especially important if you are taking diabetes medicines or blood thinners. ? Taking medicines such as aspirin and ibuprofen. These medicines can thin your blood. Do not take these medicines before your procedure if your health care provider instructs you not to.  Plan to have someone take  you home from the hospital or clinic. What happens during the procedure?  To lower your risk of infection: ? Your health care team will wash or sanitize their hands.  You will lie on an exam table with your feet and legs supported as in a pelvic exam.  Your health care provider will insert an instrument (speculum) into your vagina to see your cervix.  Your cervix will be cleansed with an antiseptic solution.  A medicine (local anesthetic) will be used to numb the cervix.  A forceps instrument (tenaculum) will be used to hold your cervix steady for the biopsy.  A thin, rod-like instrument (uterine sound) will be inserted  through your cervix to determine the length of your uterus and the location where the biopsy sample will be removed.  A thin, flexible tube (catheter) will be inserted through your cervix and into the uterus. The catheter will be used to collect the biopsy sample from your endometrial tissue.  The catheter and speculum will then be removed, and the tissue sample will be sent to a lab for examination. What happens after the procedure?  You will rest in a recovery area until you are ready to go home.  You may have mild cramping and a small amount of vaginal bleeding. This is normal.  It is up to you to get the results of your procedure. Ask your health care provider, or the department that is doing the procedure, when your results will be ready. Summary  Endometrial biopsy is a procedure in which a tissue sample is taken from the endometrium, which is the lining of the uterus.  This procedure may help to diagnose menstrual cycle problems, abnormal bleeding, or other conditions affecting the endometrium.  Before the procedure, keep a record of your menstrual cycles as told by your health care provider.  The tissue sample that is removed will be checked under a microscope to see if it is normal or abnormal. This information is not intended to replace advice given to you by your health care provider. Make sure you discuss any questions you have with your health care provider. Document Revised: 03/05/2017 Document Reviewed: 04/08/2016 Elsevier Patient Education  [place]. Endometrial Biopsy, Care After This sheet gives you information about how to care for yourself after your procedure. Your health care provider may also give you more specific instructions. If you have problems or questions, contact your health care provider. What can I expect after the procedure? After the procedure, it is common to have:  Mild cramping.  A small amount of vaginal bleeding for a few days. This is  normal. Follow these instructions at home:   Take over-the-counter and prescription medicines only as told by your health care provider.  Do not douche, use tampons, or have sexual intercourse until your health care provider approves.  Return to your normal activities as told by your health care provider. Ask your health care provider what activities are safe for you.  Follow instructions from your health care provider about any activity restrictions, such as restrictions on strenuous exercise or heavy lifting. Contact a health care provider if:  You have heavy bleeding, or bleed for longer than 2 days after the procedure.  You have bad smelling discharge from your vagina.  You have a fever or chills.  You have a burning sensation when urinating or you have difficulty urinating.  You have severe pain in your lower abdomen. Get help right away if:  You have severe cramps  in your stomach or back.  You pass large blood clots.  Your bleeding increases.  You become weak or light-headed, or you pass out. Summary  After the procedure, it is common to have mild cramping and a small amount of vaginal bleeding for a few days.  Do not douche, use tampons, or have sexual intercourse until your health care provider approves.  Return to your normal activities as told by your health care provider. Ask your health care provider what activities are safe for you. This information is not intended to replace advice given to you by your health care provider. Make sure you discuss any questions you have with your health care provider. Document Revised: 03/05/2017 Document Reviewed: 04/08/2016 Elsevier Patient Education  2020 [place]. Uterine Artery Embolization for Fibroids  Uterine artery embolization is a procedure to shrink uterine fibroids. Uterine fibroids are masses of tissue (tumors) that can develop in the womb (uterus). They are also called leiomyomas. This type of tumor is not  cancerous (benign) and does not spread to other parts of the body outside of the pelvic area. The pelvic area is the part of the body between the hip bones. You can have one or many fibroids. Fibroids can vary in size, shape, weight, and where they grow in the uterus. Some can become quite large. In this procedure, a thin plastic tube (catheter) is used to inject a chemical that blocks off the blood supply to the fibroid, which causes the fibroid to shrink. Tell a health care provider about:  Any allergies you have.  All medicines you are taking, including vitamins, herbs, eye drops, creams, and over-the-counter medicines.  Any problems you or family members have had with anesthetic medicines.  Any blood disorders you have.  Any surgeries you have had.  Any medical conditions you have.  Whether you are pregnant or may be pregnant. What are the risks? Generally, this is a safe procedure. However, problems may occur, including:  Bleeding.  Allergic reactions to medicines or dyes.  Damage to other structures or organs.  Infection, including blood infection (septicemia).  Injury to the uterus from decreased blood supply.  Lack of menstrual periods (amenorrhea).  Death of tissue cells (necrosis) around your bladder or vulva.  Development of a hole between organs or from an organ to the surface of your skin (fistula).  Blood clot in the legs (deep vein thrombosis) or lung (pulmonary embolus).  Nausea and vomiting. What happens before the procedure? Staying hydrated Follow instructions from your health care provider about hydration, which may include:  Up to 2 hours before the procedure - you may continue to drink clear liquids, such as water, clear fruit juice, black coffee, and plain tea. Eating and drinking restrictions Follow instructions from your health care provider about eating and drinking, which may include:  8 hours before the procedure - stop eating heavy meals or  foods such as meat, fried foods, or fatty foods.  6 hours before the procedure - stop eating light meals or foods, such as toast or cereal.  6 hours before the procedure - stop drinking milk or drinks that contain milk.  2 hours before the procedure - stop drinking clear liquids. Medicines  Ask your health care provider about: ? Changing or stopping your regular medicines. This is especially important if you are taking diabetes medicines or blood thinners. ? Taking over-the-counter medicines, vitamins, herbs, and supplements. ? Taking medicines such as aspirin and ibuprofen. These medicines can thin your blood.  Do not take these medicines unless your health care provider tells you to take them.  You may be given antibiotic medicine to help prevent infection.  You may be given medicine to prevent nausea and vomiting (antiemetic). General instructions  Ask your health care provider how your surgical site will be marked or identified.  You may be asked to shower with a germ-killing soap.  Plan to have someone take you home from the hospital or clinic.  If you will be going home right after the procedure, plan to have someone with you for 24 hours.  You will be asked to empty your bladder. What happens during the procedure?  To lower your risk of infection: ? Your health care team will wash or sanitize their hands. ? Hair may be removed from the surgical area. ? Your skin will be washed with soap.  An IV will be inserted into one of your veins.  You will be given one or more of the following: ? A medicine to help you relax (sedative). ? A medicine to numb the area (local anesthetic).  A small cut (incision) will be made in your groin.  A catheter will be inserted into the main artery of your leg. The catheter will be guided through the artery to your uterus.  A series of images will be taken while dye is injected through the catheter in your groin. X-rays are taken at the  same time. This is done to provide a road map of the blood supply to your uterus and fibroids.  Tiny plastic spheres, about the size of sand grains, will be injected through the catheter. Metal coils may be used to help block the artery. The particles will lodge in tiny branches of the uterine artery that supplies blood to the fibroids.  The procedure will be repeated on the artery that supplies the other side of the uterus.  The catheter will be removed and pressure will be applied to stop the bleeding.  A dressing will be placed over the incision. The procedure may vary among health care providers and hospitals. What happens after the procedure?  Your blood pressure, heart rate, breathing rate, and blood oxygen level will be monitored until the medicines you were given have worn off.  You will be given pain medicine as needed.  You may be given medicine for nausea and vomiting as needed.  Do not drive for 24 hours if you were given a sedative. Summary  Uterine artery embolization is a procedure to shrink uterine fibroids by blocking the blood supply to the fibroid.  You may be given a sedative and local anesthetic for the procedure.  A catheter will be inserted into the main artery of your leg. The catheter will be guided through the artery to your uterus.  After the procedure you will be given pain medicine and medicine for nausea as needed.  Do not drive for 24 hours if you were given a sedative. This information is not intended to replace advice given to you by your health care provider. Make sure you discuss any questions you have with your health care provider. Document Revised: 03/05/2017 Document Reviewed: 06/25/2016 Elsevier Patient Education  2020 [place]. Uterine Fibroids  Uterine fibroids (leiomyomas) are noncancerous (benign) tumors that can develop in the uterus. Fibroids may also develop in the fallopian tubes, cervix, or tissues (ligaments) near the  uterus. You may have one or many fibroids. Fibroids vary in size, weight, and where they grow in the  uterus. Some can become quite large. Most fibroids do not require medical treatment. What are the causes? The cause of this condition is not known. What increases the risk? You are more likely to develop this condition if you:  Are in your 30s or 40s and have not gone through menopause.  Have a family history of this condition.  Are of African-American descent.  Had your first period at an early age (early menarche).  Have not had any children (nulliparity).  Are overweight or obese. What are the signs or symptoms? Many women do not have any symptoms. Symptoms of this condition may include:  Heavy menstrual bleeding.  Bleeding or spotting between periods.  Pain and pressure in the pelvic area, between the hips.  Bladder problems, such as needing to urinate urgently or more often than usual.  Inability to have children (infertility).  Failure to carry pregnancy to term (miscarriage). How is this diagnosed? This condition may be diagnosed based on:  Your symptoms and medical history.  A physical exam.  A pelvic exam that includes feeling for any tumors.  Imaging tests, such as ultrasound or MRI. How is this treated? Treatment for this condition may include:  Seeing your health care provider for follow-up visits to monitor your fibroids for any changes.  Taking NSAIDs such as ibuprofen, naproxen, or aspirin to reduce pain.  Hormone medicines. These may be taken as a pill, given in an injection, or delivered by a T-shaped device that is inserted into the uterus (intrauterine device, IUD).  Surgery to remove one of the following: ? The fibroids (myomectomy). Your health care provider may recommend this if fibroids affect your fertility and you want to become pregnant. ? The uterus (hysterectomy). ? Blood supply to the fibroids (uterine artery embolization). Follow  these instructions at home:  Take over-the-counter and prescription medicines only as told by your health care provider.  Ask your health care provider if you should take iron pills or eat more iron-rich foods, such as dark green, leafy vegetables. Heavy menstrual bleeding can cause low iron levels.  If directed, apply heat to your back or abdomen to reduce pain. Use the heat source that your health care provider recommends, such as a moist heat pack or a heating pad. ? Place a towel between your skin and the heat source. ? Leave the heat on for 20-30 minutes. ? Remove the heat if your skin turns bright red. This is especially important if you are unable to feel pain, heat, or cold. You may have a greater risk of getting burned.  Pay close attention to your menstrual cycle. Tell your health care provider about any changes, such as: ? Increased blood flow that requires you to use more pads or tampons than usual. ? A change in the number of days that your period lasts. ? A change in symptoms that are associated with your period, such as back pain or cramps in your abdomen.  Keep all follow-up visits as told by your health care provider. This is important, especially if your fibroids need to be monitored for any changes. Contact a health care provider if you:  Have pelvic pain, back pain, or cramps in your abdomen that do not get better with medicine or heat.  Develop new bleeding between periods.  Have increased bleeding during or between periods.  Feel unusually tired or weak.  Feel light-headed. Get help right away if you:  Faint.  Have pelvic pain that suddenly gets worse.  Have  severe vaginal bleeding that soaks a tampon or pad in 30 minutes or less. Summary  Uterine fibroids are noncancerous (benign) tumors that can develop in the uterus.  The exact cause of this condition is not known.  Most fibroids do not require medical treatment unless they affect your ability to have  children (fertility).  Contact a health care provider if you have pelvic pain, back pain, or cramps in your abdomen that do not get better with medicines.  Make sure you know what symptoms should cause you to get help right away. This information is not intended to replace advice given to you by your health care provider. Make sure you discuss any questions you have with your health care provider. Document Revised: 03/05/2017 Document Reviewed: 02/16/2017 Elsevier Patient Education  2020 [employer].

## 2020-03-06 LAB — SURGICAL PATHOLOGY

## 2020-03-07 ENCOUNTER — Telehealth: Payer: Self-pay | Admitting: *Deleted

## 2020-03-07 ENCOUNTER — Other Ambulatory Visit: Payer: Self-pay | Admitting: Lactation Services

## 2020-03-07 MED ORDER — IBUPROFEN 600 MG PO TABS: 600.0000 mg | ORAL_TABLET | Freq: Four times a day (QID) | ORAL | 1 refills | Status: DC | PRN

## 2020-03-07 NOTE — Progress Notes (Signed)
Patient with abdominal pain post Endometrial biopsy. Spoke with Earlie Server, NNP, who ordered Ibuprofen for pain.

## 2020-03-07 NOTE — Telephone Encounter (Signed)
Patient left a voice message this am stating she had a biopsy 29th and has not stopped hurting or bleeding. States she needs pain med called in to [facility] on [address] and a call back. Per chart review patient spoke to someone in our office today and a RX was sent in for Ibuprofen for pain. Andie Mungin,RN

## 2020-03-09 ENCOUNTER — Other Ambulatory Visit: Payer: Self-pay | Admitting: Family Medicine

## 2020-03-09 DIAGNOSIS — I10 Essential (primary) hypertension: Secondary | ICD-10-CM

## 2020-03-09 NOTE — Telephone Encounter (Signed)
Requested medications are due for refill today yes  Requested medications are on the active medication list yes  Last refill 11/3  Last visit 03/2019  Future visit scheduled no  Notes to clinic Is this pt associated with your practice, no PCP listed but Dr Margarita Rana wrote this rx.

## 2020-03-15 ENCOUNTER — Other Ambulatory Visit: Payer: Self-pay | Admitting: Obstetrics and Gynecology

## 2020-03-15 DIAGNOSIS — Z01419 Encounter for gynecological examination (general) (routine) without abnormal findings: Secondary | ICD-10-CM

## 2020-03-19 ENCOUNTER — Other Ambulatory Visit: Payer: Self-pay | Admitting: Nurse Practitioner

## 2020-03-21 NOTE — Progress Notes (Signed)
Pt. Needs orders for upcomming surgery.PAT and labs appointment on 03/22/20.Thanks

## 2020-03-21 NOTE — Patient Instructions (Signed)
DUE TO COVID-19 ONLY ONE VISITOR IS ALLOWED TO COME WITH YOU AND STAY IN THE WAITING ROOM ONLY DURING PRE OP AND PROCEDURE DAY OF SURGERY. THE 1 VISITOR  MAY VISIT WITH YOU AFTER SURGERY IN YOUR PRIVATE ROOM DURING VISITING HOURS ONLY!  YOU NEED TO HAVE A COVID 19 TEST ON: 03/22/20 @               , THIS TEST MUST BE DONE BEFORE SURGERY,  COVID TESTING SITE [place] [address], IT IS ON THE RIGHT GOING OUT [address] APPROXIMATELY  2 MINUTES PAST [employer] ON THE RIGHT. ONCE YOUR COVID TEST IS COMPLETED,  PLEASE BEGIN THE QUARANTINE INSTRUCTIONS AS OUTLINED IN YOUR HANDOUT.                SHRILEY JOFFE    Your procedure is scheduled on: 03/26/20   Report to [facility] Main  Entrance   Report to admitting at: 6:45 AM     Call this number if you have problems the morning of surgery [phone]    Remember: Do not eat food or drink liquids :After Midnight.   BRUSH YOUR TEETH MORNING OF SURGERY AND RINSE YOUR MOUTH OUT, NO CHEWING GUM CANDY OR MINTS.                          You may not have any metal on your body including hair pins and              piercings  Do not wear jewelry, make-up, lotions, powders or perfumes, deodorant             Do not wear nail polish on your fingernails.  Do not shave  48 hours prior to surgery.               Do not bring valuables to the hospital. [place].  Contacts, dentures or bridgework may not be worn into surgery.  Leave suitcase in the car. After surgery it may be brought to your room.     Patients discharged the day of surgery will not be allowed to drive home. IF YOU ARE HAVING SURGERY AND GOING HOME THE SAME DAY, YOU MUST HAVE AN ADULT TO DRIVE YOU HOME AND BE WITH YOU FOR 24 HOURS. YOU MAY GO HOME BY TAXI OR UBER OR ORTHERWISE, BUT AN ADULT MUST ACCOMPANY YOU HOME AND STAY WITH YOU FOR 24 HOURS.  Name and phone number of your  driver:  Special Instructions: N/A              Please read over the following fact sheets you were given: _____________________________________________________________________         [facility] - Preparing for Surgery Before surgery, you can play an important role.  Because skin is not sterile, your skin needs to be as free of germs as possible.  You can reduce the number of germs on your skin by washing with CHG (chlorahexidine gluconate) soap before surgery.  CHG is an antiseptic cleaner which kills germs and bonds with the skin to continue killing germs even after washing. Please DO NOT use if you have an allergy to CHG or antibacterial soaps.  If your skin becomes reddened/irritated stop using the CHG and inform your nurse when you arrive at Short Stay.  Do not shave (including legs and underarms) for at least 48 hours prior to the first CHG shower.  You may shave your face/neck. Please follow these instructions carefully:  1.  Shower with CHG Soap the night before surgery and the  morning of Surgery.  2.  If you choose to wash your hair, wash your hair first as usual with your  normal  shampoo.  3.  After you shampoo, rinse your hair and body thoroughly to remove the  shampoo.                           4.  Use CHG as you would any other liquid soap.  You can apply chg directly  to the skin and wash                       Gently with a scrungie or clean washcloth.  5.  Apply the CHG Soap to your body ONLY FROM THE NECK DOWN.   Do not use on face/ open                           Wound or open sores. Avoid contact with eyes, ears mouth and genitals (private parts).                       Wash face,  Genitals (private parts) with your normal soap.             6.  Wash thoroughly, paying special attention to the area where your surgery  will be performed.  7.  Thoroughly rinse your body with warm water from the neck down.  8.  DO NOT shower/wash with your normal soap after using and rinsing off   the CHG Soap.                9.  Pat yourself dry with a clean towel.            10.  Wear clean pajamas.            11.  Place clean sheets on your bed the night of your first shower and do not  sleep with pets. Day of Surgery : Do not apply any lotions/deodorants the morning of surgery.  Please wear clean clothes to the hospital/surgery center.  FAILURE TO FOLLOW THESE INSTRUCTIONS MAY RESULT IN THE CANCELLATION OF YOUR SURGERY PATIENT SIGNATURE_________________________________  NURSE SIGNATURE__________________________________  ________________________________________________________________________

## 2020-03-22 ENCOUNTER — Other Ambulatory Visit (HOSPITAL_COMMUNITY): Payer: No Typology Code available for payment source

## 2020-03-22 ENCOUNTER — Encounter (HOSPITAL_COMMUNITY)
Admission: RE | Admit: 2020-03-22 | Discharge: 2020-03-22 | Disposition: A | Discharge status: Home / Self Care | Payer: Medicaid Other | Source: Ambulatory Visit | Attending: Anesthesiology | Admitting: Anesthesiology

## 2020-03-23 ENCOUNTER — Other Ambulatory Visit: Payer: Self-pay | Admitting: Nurse Practitioner

## 2020-03-25 ENCOUNTER — Telehealth: Payer: Self-pay | Admitting: Obstetrics and Gynecology

## 2020-03-25 NOTE — Telephone Encounter (Signed)
Patient called to say she no longer wanted to have surgery, and request cancellation.

## 2020-03-25 NOTE — Progress Notes (Signed)
Pt verified she does want to reschedule her operation until after Christmas

## 2020-03-26 ENCOUNTER — Ambulatory Visit (HOSPITAL_COMMUNITY)
Admission: RE | Admit: 2020-03-26 | Payer: No Typology Code available for payment source | Source: Home / Self Care | Admitting: Obstetrics and Gynecology

## 2020-03-26 ENCOUNTER — Encounter (HOSPITAL_COMMUNITY): Admission: RE | Payer: Self-pay | Source: Home / Self Care

## 2020-03-26 SURGERY — DILATATION & CURETTAGE/HYSTEROSCOPY WITH MYOSURE
Anesthesia: Choice

## 2020-03-27 ENCOUNTER — Other Ambulatory Visit: Payer: Self-pay | Admitting: Nurse Practitioner

## 2020-03-31 ENCOUNTER — Other Ambulatory Visit: Payer: Self-pay | Admitting: Nurse Practitioner

## 2020-04-04 ENCOUNTER — Other Ambulatory Visit: Payer: Self-pay | Admitting: Nurse Practitioner

## 2020-04-06 DIAGNOSIS — U071 COVID-19: Secondary | ICD-10-CM

## 2020-04-06 HISTORY — DX: COVID-19: U07.1

## 2020-04-08 ENCOUNTER — Other Ambulatory Visit: Payer: Self-pay | Admitting: Nurse Practitioner

## 2020-04-08 ENCOUNTER — Other Ambulatory Visit: Payer: Self-pay | Admitting: Family Medicine

## 2020-04-08 DIAGNOSIS — I10 Essential (primary) hypertension: Secondary | ICD-10-CM

## 2020-04-08 NOTE — Telephone Encounter (Signed)
Requested medication (s) are due for refill today: yes  Requested medication (s) are on the active medication list: yes  Last refill:  02/07/20 #30 0 refills  Future visit scheduled: no  Notes to clinic:  no valid encounter within 6 months. Called to schedule appt. No answer, left message to call back to clinic to schedule appt for further refills. Do you want 2 courtesy refill?     Requested Prescriptions  Pending Prescriptions Disp Refills   hydrochlorothiazide (HYDRODIURIL) 25 MG tablet [Pharmacy Med Name: HYDROCHLOROTHIAZIDE 25MG  TABLETS] 30 tablet 0    Sig: TAKE 1 TABLET(25 MG) BY MOUTH DAILY      Cardiovascular: Diuretics - Thiazide Failed - 04/08/2020 12:59 PM      Failed - Last BP in normal range    BP Readings from Last 1 Encounters:  03/04/20 (!) 147/98          Failed - Valid encounter within last 6 months    Recent Outpatient Visits           1 year ago Muscle spasm    [facility] [place], [place], [place]   1 year ago Trichomonas vaginitis   [facility] [place], [place], M                Passed - Ca in normal range and within 360 days    Calcium  Date Value Ref Range Status  01/09/2020 8.8 8.7 - 10.2 mg/dL Final   Calcium, Ion  Date Value Ref Range Status  01/11/2008 1.15  Final          Passed - Cr in normal range and within 360 days    Creatinine, Ser  Date Value Ref Range Status  01/09/2020 0.86 0.57 - 1.00 mg/dL Final          Passed - K in normal range and within 360 days    Potassium  Date Value Ref Range Status  01/09/2020 3.7 3.5 - 5.2 mmol/L Final          Passed - Na in normal range and within 360 days    Sodium  Date Value Ref Range Status  01/09/2020 140 134 - 144 mmol/L Final

## 2020-04-12 ENCOUNTER — Other Ambulatory Visit: Payer: Self-pay | Admitting: Nurse Practitioner

## 2020-04-14 ENCOUNTER — Ambulatory Visit (HOSPITAL_COMMUNITY)
Admission: EM | Admit: 2020-04-14 | Discharge: 2020-04-14 | Disposition: A | Discharge status: Home / Self Care | Payer: 59

## 2020-04-14 ENCOUNTER — Emergency Department (HOSPITAL_COMMUNITY)
Admission: EM | Admit: 2020-04-14 | Discharge: 2020-04-14 | Disposition: A | Discharge status: Home / Self Care | Payer: 59 | Attending: Emergency Medicine | Admitting: Emergency Medicine

## 2020-04-14 ENCOUNTER — Other Ambulatory Visit: Payer: Self-pay

## 2020-04-14 ENCOUNTER — Emergency Department (HOSPITAL_COMMUNITY): Payer: 59

## 2020-04-14 ENCOUNTER — Encounter (HOSPITAL_COMMUNITY): Payer: Self-pay

## 2020-04-14 DIAGNOSIS — Z87891 Personal history of nicotine dependence: Secondary | ICD-10-CM | POA: Insufficient documentation

## 2020-04-14 DIAGNOSIS — H532 Diplopia: Secondary | ICD-10-CM | POA: Diagnosis present

## 2020-04-14 DIAGNOSIS — H539 Unspecified visual disturbance: Secondary | ICD-10-CM

## 2020-04-14 DIAGNOSIS — G939 Disorder of brain, unspecified: Secondary | ICD-10-CM | POA: Diagnosis not present

## 2020-04-14 DIAGNOSIS — U071 COVID-19: Secondary | ICD-10-CM | POA: Insufficient documentation

## 2020-04-14 DIAGNOSIS — R030 Elevated blood-pressure reading, without diagnosis of hypertension: Secondary | ICD-10-CM

## 2020-04-14 DIAGNOSIS — I10 Essential (primary) hypertension: Secondary | ICD-10-CM | POA: Insufficient documentation

## 2020-04-14 DIAGNOSIS — R519 Headache, unspecified: Secondary | ICD-10-CM

## 2020-04-14 DIAGNOSIS — Z20822 Contact with and (suspected) exposure to covid-19: Secondary | ICD-10-CM

## 2020-04-14 LAB — CBC
HCT: 37.3 % (ref 36.0–46.0)
Hemoglobin: 11.9 g/dL — ABNORMAL LOW (ref 12.0–15.0)
MCH: 25.9 pg — ABNORMAL LOW (ref 26.0–34.0)
MCHC: 31.9 g/dL (ref 30.0–36.0)
MCV: 81.3 fL (ref 80.0–100.0)
Platelets: 370 10*3/uL (ref 150–400)
RBC: 4.59 MIL/uL (ref 3.87–5.11)
RDW: 14.8 % (ref 11.5–15.5)
WBC: 2.7 10*3/uL — ABNORMAL LOW (ref 4.0–10.5)
nRBC: 0 % (ref 0.0–0.2)

## 2020-04-14 LAB — BASIC METABOLIC PANEL
Anion gap: 9 (ref 5–15)
BUN: 9 mg/dL (ref 6–20)
CO2: 26 mmol/L (ref 22–32)
Calcium: 8.7 mg/dL — ABNORMAL LOW (ref 8.9–10.3)
Chloride: 104 mmol/L (ref 98–111)
Creatinine, Ser: 0.79 mg/dL (ref 0.44–1.00)
GFR, Estimated: >60 mL/min (ref >60)
Glucose, Bld: 101 mg/dL — ABNORMAL HIGH (ref 70–99)
Potassium: 3.5 mmol/L (ref 3.5–5.1)
Sodium: 139 mmol/L (ref 135–145)

## 2020-04-14 LAB — I-STAT BETA HCG BLOOD, ED (MC, WL, AP ONLY): I-stat hCG, quantitative: <5 m[IU]/mL (ref <5)

## 2020-04-14 MED ORDER — GADOBUTROL 1 MMOL/ML IV SOLN
9.5000 mL | Freq: Once | INTRAVENOUS | Status: AC | PRN
  Administered 2020-04-14: 9.5 mL via INTRAVENOUS

## 2020-04-14 NOTE — ED Provider Notes (Signed)
2 day hx of blurred, double vision b/l eyes and headache. Discussed necessity of going to ED. Nursing staff to transport to [facility] ED. Hemodynamically stable for transport.     Volney American, [place] 04/14/20 1352

## 2020-04-14 NOTE — ED Triage Notes (Signed)
Patient is being discharged from the Urgent Care and sent to the Emergency Department via [facility] by [facility] . Per Curly Shores PA, patient is in need of higher level of care due to need for imaging with double vision and hx of brain lesion. Patient is aware and verbalizes understanding of plan of care.  Vitals:   04/14/20 1105  BP: (!) 149/99  Pulse: 93  Resp: 17  Temp: 98.3 F (36.8 C)  SpO2: 99%

## 2020-04-14 NOTE — ED Notes (Signed)
Spoke with pt to take vitals and update on wait.  She states she was sent from [facility] for a CT due to double vision x 2 days.  Pt updated on wait and CT ordered.

## 2020-04-14 NOTE — ED Triage Notes (Signed)
Patient complains of 2 days of bilateral blurred vision and bilateral leg pain. Patient sent from urgent care for further evaluation

## 2020-04-14 NOTE — Discharge Instructions (Signed)
At this time there does not appear to be the presence of an emergent medical condition, however there is always the potential for conditions to change. Please read and follow the below instructions.  Please return to the Emergency Department immediately for any new or worsening symptoms. Please be sure to follow up with your Primary Care Provider within one week regarding your visit today; please call their office to schedule an appointment even if you are feeling better for a follow-up visit. Please call either [facility] or [facility] under discharge paperwork to schedule follow-up appointment for further evaluation of your abnormal MRI findings.  You will need a follow-up MRI in 3 months for reassessment.  You may use the eye patch given to you today to help with your double vision. Your Covid test should result in the next 1-2 days please check your MyChart account for those results and discuss them with your primary care provider at your follow-up appointment. Your blood pressure was elevated in the ER today please take your blood pressure medication as prescribed by your PCP.  Please follow-up with your primary care doctor within 1 week for blood pressure recheck and medication management.  Go to the nearest Emergency Department immediately if: You have fever or chills Get a very bad headache. Start to feel mixed up (confused). Feel weak or numb. Feel faint. Have very bad pain in your: Chest. Belly (abdomen). Throw up more than once. Have trouble breathing. Have sudden vision loss. Suddenly get a very bad headache. Have sudden weakness or numbness. Suddenly lose the ability to speak, understand speech, or both. You have any new/concerning or worsening of symptoms   Please read the additional information packets attached to your discharge summary.  Do not take your medicine if  develop an itchy rash, swelling in your mouth or lips, or difficulty breathing; call 911 and  seek immediate emergency medical attention if this occurs.  You may review your lab tests and imaging results in their entirety on your MyChart account.  Please discuss all results of fully with your primary care provider and other specialist at your follow-up visit.  Note: Portions of this text may have been transcribed using voice recognition software. Every effort was made to ensure accuracy; however, inadvertent computerized transcription errors may still be present.

## 2020-04-14 NOTE — ED Triage Notes (Addendum)
Pt presents with body aches xs 3 days. Also states that she is unable to see xs 2-3 days. States feels as if her vision is off. States had chest pain yesterday as if she had indigestion for 2-3 hours and went away.   Curly Shores, PA advised of pts condition.

## 2020-04-14 NOTE — ED Provider Notes (Signed)
[facility] EMERGENCY DEPARTMENT Provider Note   CSN: [id number] Arrival date & time: 04/14/20  1130     History No chief complaint on file.   Connie Herrera is a 45 y.o. female presents today for diplopia and blurry vision onset 2 days ago.  Symptoms have been constant since onset no history of similar in the past.  She denies any associated pain or headache reports that she is otherwise feeling well.  She reports having trouble with measuring distances due to double vision.  Denies fever/chills, recent illness, chest pains or shortness breath, dumping, nausea/vomiting, diarrhea or any additional concerns.  HPI     Past Medical History:  Diagnosis Date  . Brain lesion   . Hypertension     Patient Active Problem List   Diagnosis Date Noted  . Anemia due to blood loss, chronic 01/10/2020  . Menorrhagia with regular cycle 01/09/2020  . BMI 33.0-33.9,adult 01/09/2020  . Screening breast examination 02/07/2019  . Breast pain 02/07/2019  . Hypertension 01/26/2019  . Paresthesias 08/04/2013  . Breast pain, right 03/11/2012    Past Surgical History:  Procedure Laterality Date  . TUBAL LIGATION       OB History    Gravida  4   Para  4   Term  4   Preterm  0   AB  0   Living  4     SAB  0   IAB  0   Ectopic  0   Multiple  0   Live Births  4           Family History  Problem Relation Age of Onset  . Healthy Mother   . Healthy Father   . Cancer Maternal Grandmother 59       breast  . Breast cancer Maternal Grandmother     Social History   Tobacco Use  . Smoking status: Former Smoker    Packs/day: 0.50    Years: 4.00    Pack years: 2.00    Types: Cigarettes    Quit date: 06/04/2017    Years since quitting: 2.8  . Smokeless tobacco: Never Used  Vaping Use  . Vaping Use: Never used  Substance Use Topics  . Alcohol use: No  . Drug use: No    Home Medications Prior to Admission medications   Medication Sig Start  Date End Date Taking? Authorizing Provider  acyclovir (ZOVIRAX) 200 MG capsule Take 2 capsules (400 mg total) by mouth 3 (three) times daily. Take for 5 days prn each outbreak. 03/18/20   Shelly Bombard, MD  hydrochlorothiazide (HYDRODIURIL) 25 MG tablet Take 1 tablet (25 mg total) by mouth daily. Must have office visit for refills 02/07/20   Charlott Rakes, MD  ibuprofen (ADVIL) 600 MG tablet Take 1 tablet (600 mg total) by mouth every 6 (six) hours as needed. 03/07/20   Burleson, Rona Ravens, NP  potassium chloride (KLOR-CON 10) 10 MEQ tablet Take 1 tablet (10 mEq total) by mouth daily. Patient not taking: Reported on 01/09/2020 04/05/19   Charlott Rakes, MD  tranexamic acid (LYSTEDA) 650 MG TABS tablet Take 2 tablets (1,300 mg total) by mouth 3 (three) times daily. Take during menses for a maximum of five days 03/04/20   Griffin Basil, MD    Allergies    Hydrocodone  Review of Systems   Review of Systems Ten systems are reviewed and are negative for acute change except as noted in the HPI  Physical Exam Updated Vital Signs BP (!) 156/95 (BP Location: Right Arm)   Pulse 87   Temp 99.2 F (37.3 C) (Oral)   Resp 16   SpO2 100%   Physical Exam Constitutional:      General: She is not in acute distress.    Appearance: Normal appearance. She is well-developed. She is not ill-appearing or diaphoretic.  HENT:     Head: Normocephalic and atraumatic.  Eyes:     General: Vision grossly intact. Gaze aligned appropriately.     Pupils: Pupils are equal, round, and reactive to light.  Neck:     Trachea: Trachea and phonation normal.  Pulmonary:     Effort: Pulmonary effort is normal. No respiratory distress.  Abdominal:     General: There is no distension.     Palpations: Abdomen is soft.     Tenderness: There is no abdominal tenderness. There is no guarding or rebound.  Musculoskeletal:        General: Normal range of motion.     Cervical back: Normal range of motion.  Skin:     General: Skin is warm and dry.  Neurological:     Mental Status: She is alert.     GCS: GCS eye subscore is 4. GCS verbal subscore is 5. GCS motor subscore is 6.     Comments: Mental Status: Alert, oriented, thought content appropriate, able to give a coherent history. Speech fluent without evidence of aphasia. Able to follow 2 step commands without difficulty. Cranial Nerves: II: Peripheral visual fields grossly normal, pupils equal, round, reactive to light III,IV, VI: ptosis not present.  Right eye lateral rectus palsy.  Otherwise movements intact. V,VII: smile symmetric, eyebrows raise symmetric, facial light touch sensation equal VIII: hearing grossly normal to voice X: uvula elevates symmetrically XI: bilateral shoulder shrug symmetric and strong XII: midline tongue extension without fassiculations Motor: Normal tone. 5/5 strength in upper and lower extremities bilaterally including strong and equal grip strength and dorsiflexion/plantar flexion Sensory: Sensation intact to light touch in all extremities.Negative Romberg.  Cerebellar: normal finger-to-nose with bilateral upper extremities. Normal heel-to -shin balance bilaterally of the lower extremity. No pronator drift.  Gait: normal gait and balance CV: distal pulses palpable throughout  Psychiatric:        Behavior: Behavior normal.     ED Results / Procedures / Treatments   Labs (all labs ordered are listed, but only abnormal results are displayed) Labs Reviewed  CBC - Abnormal; Notable for the following components:      Result Value   WBC 2.7 (*)    Hemoglobin 11.9 (*)    MCH 25.9 (*)    All other components within normal limits  BASIC METABOLIC PANEL - Abnormal; Notable for the following components:   Glucose, Bld 101 (*)    Calcium 8.7 (*)    All other components within normal limits  SARS CORONAVIRUS 2 (TAT 6-24 HRS)  I-STAT BETA HCG BLOOD, ED ([facility], [facility], [facility] ONLY)    EKG None  Radiology CT HEAD WO  CONTRAST  Result Date: 04/14/2020 CLINICAL DATA:  45 year old female with blurred vision. EXAM: CT HEAD WITHOUT CONTRAST TECHNIQUE: Contiguous axial images were obtained from the base of the skull through the vertex without intravenous contrast. COMPARISON:  None. FINDINGS: Brain: The ventricles and sulci appropriate size for patient's age. The gray-white matter discrimination is preserved. Faint 6 mm high density focus in the region of the left lentiform nucleus (15/3) is not characterized. Further evaluation with  MRI without and with contrast is recommended. There is no acute intracranial hemorrhage. No mass effect or midline shift. No extra-axial fluid collection. Vascular: No hyperdense vessel or unexpected calcification. Skull: Normal. Negative for fracture or focal lesion. Sinuses/Orbits: Mild mucoperiosteal thickening of paranasal sinuses. No air-fluid. The mastoid air cells are clear. Other: None IMPRESSION: 1. No acute intracranial pathology. 2. Faint 6 mm high density focus in the region of the left lentiform nucleus. Further evaluation with MRI without and with contrast is recommended. Electronically Signed   By: Anner Crete M.D.   On: 04/14/2020 18:00   MR Brain W and Wo Contrast  Result Date: 04/14/2020 CLINICAL DATA:  Initial evaluation for brain mass or lesion. EXAM: MRI HEAD WITHOUT AND WITH CONTRAST TECHNIQUE: Multiplanar, multiecho pulse sequences of the brain and surrounding structures were obtained without and with intravenous contrast. CONTRAST:  9.5 cc of Gadavist. COMPARISON:  Prior CT from earlier the same day. FINDINGS: Brain: Cerebral volume within normal limits for age. Scattered patchy T2/FLAIR hyperintensity within the periventricular, deep, and subcortical white matter both cerebral hemispheres, nonspecific, but most commonly related to chronic microvascular ischemic disease. Overall, appearance is mild in nature. No abnormal foci of restricted diffusion to suggest acute or  subacute ischemia. Gray-white matter differentiation maintained. No encephalomalacia to suggest chronic cortical infarction. No acute intracranial hemorrhage. 9 mm round well-circumscribed lesion seen at the lateral margin of the left lentiform nucleus, corresponding with finding on prior CT. Lesion demonstrates alternating T2 hyperintense and hypointense rims, with prominent blooming artifact on SWI sequence. No significant surrounding edema or FLAIR signal abnormality. Lesion does demonstrates smooth concentric enhancement following contrast administration. 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 No definite associated DVA. No other mass lesion, midline shift or mass effect. No other abnormal enhancement. No extra-axial fluid collection. Ventricles normal size without hydrocephalus. Pituitary gland suprasellar region normal. Midline structures intact. Vascular: Major intracranial vascular flow voids are maintained. Skull and upper cervical spine: Craniocervical junction within normal limits. Diffusely decreased T1 signal intensity seen within the visualized bone marrow, nonspecific, but most commonly related to anemia, smoking, or obesity. No focal marrow replacing lesion. No scalp soft tissue abnormality. Sinuses/Orbits: Globes and orbital soft tissues within normal limits. Mild mucosal thickening noted within the ethmoidal air cells. Paranasal sinuses are otherwise clear. Trace left mastoid effusion noted, of doubtful significance. Inner ear structures grossly normal. Other: None. IMPRESSION: 1. 9 mm well-circumscribed lesion involving the lateral margin of the left lentiform nucleus, corresponding with finding on prior CT. 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 However, the associated enhancement about this lesion is somewhat greater and atypical for what is usually seen with these lesions. Given this, a short interval follow-up MRI, with and without contrast, in 3  months is recommended to document stability. 2. No other acute intracranial abnormality. 3. Mild T2/FLAIR hyperintensity involving the supratentorial cerebral white matter, nonspecific, but most commonly related to chronic microvascular ischemic disease. Electronically Signed   By: Jeannine Boga M.D.   On: 04/14/2020 20:25    Procedures Procedures (including critical care time)  Medications Ordered in ED Medications  gadobutrol (GADAVIST) 1 MMOL/ML injection 9.5 mL (9.5 mLs Intravenous Contrast Given 04/14/20 2007)    ED Course  I have reviewed the triage vital signs and the nursing notes.  Pertinent labs & imaging results that were available during my care of the patient were reviewed by me and considered in my medical decision making (see  chart for details).  Clinical Course as of 04/14/20 2118  Nancy Fetter Apr 14, 2020  2037 Dr. Leonel Ramsay [BM]  2039 Outpatient [BM]  2040 Neurology [BM]    Clinical Course User Index [BM] Gari Crown   MDM Rules/Calculators/A&P                         Additional history obtained from: 1. Nursing notes from this visit. 2. Review of electronic medical records.  Patient had MRI brain 2013 which showed numerous abnormal white matter signals within the cerebral hemispheres concerning for demyelinating disease.  Patient reports that she was informed of this finding but it never caused her any trouble so she never followed up with neurology. ------------------- I ordered, reviewed and interpreted labs which include: CBC with my leukopenia of 2.7, mild anemia of 11.9. Pregnancy test negative BMP shows no emergent electrolyte derangement, AKI or gap.  CT Head:  IMPRESSION:  1. No acute intracranial pathology.  2. Faint 6 mm high density focus in the region of the left lentiform  nucleus. Further evaluation with MRI without and with contrast is  recommended.   MRI Brain w/wo:  IMPRESSION:  1. 9 mm well-circumscribed lesion involving  the lateral margin of  the left lentiform nucleus, corresponding with finding on prior CT.  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  However, the associated enhancement about this lesion is somewhat  greater and atypical for what is usually seen with these lesions.  Given this, a short interval follow-up MRI, with and without  contrast, in 3 months is recommended to document stability.  2. No other acute intracranial abnormality.  3. Mild T2/FLAIR hyperintensity involving the supratentorial  cerebral white matter, nonspecific, but most commonly related to  chronic microvascular ischemic disease.  - 45 year old female presented for diplopia and blurred vision x2 days on exam she had right abducens palsy otherwise neurologic examination was unremarkable.  MRI was obtained which showed findings above.  I consulted neurologist Dr. Leonel Ramsay at 8:37 PM he reviewed patient's imaging and history and advised that there is no indication for admission or further work-up in the ER at this time.  Dr. Leonel Ramsay advises that patient will need outpatient neurology follow-up. - I reassessed the patient she is resting comfortably in bed no acute distress she states understanding of findings above and is asking for discharge.  I referred patient to both [facility] neurology and she is to call tomorrow morning to schedule follow-up appointments.  At discharge patient is requesting Covid test, feel it is reasonable the 6/24-hour Covid test has been ordered and patient will follow up on her MyChart account for results and discussed them with her primary care provider at her follow-up visit.  Patient without respiratory or GI symptoms and vital signs without fever tachycardia or hypoxia on room air.  Patient advised to take all daily medications as prescribed by PCP.  At this time there does not appear to be any evidence of an acute emergency medical condition and the patient  appears stable for discharge with appropriate outpatient follow up. Diagnosis was discussed with patient who verbalizes understanding of care plan and is agreeable to discharge. I have discussed return precautions with patient who verbalizes understanding. Patient encouraged to follow-up with their PCP and Neurology. All questions answered.  Patient's case discussed with Dr. Sherry Ruffing who agrees with plan to discharge with follow-up.   Note: Portions of this report may  have been transcribed using voice recognition software. Every effort was made to ensure accuracy; however, inadvertent computerized transcription errors may still be present. Final Clinical Impression(s) / ED Diagnoses Final diagnoses:  Brain lesion  Elevated blood pressure reading  Encounter for screening laboratory testing for COVID-19 virus    Rx / DC Orders ED Discharge Orders    None       Gari Crown 04/14/20 2118    Tegeler, Gwenyth Allegra, MD 04/14/20 2127

## 2020-04-14 NOTE — ED Notes (Signed)
Pt was transported to [facility]

## 2020-04-15 LAB — SARS CORONAVIRUS 2 (TAT 6-24 HRS): SARS Coronavirus 2: POSITIVE — AB

## 2020-04-16 ENCOUNTER — Other Ambulatory Visit: Payer: Self-pay | Admitting: Nurse Practitioner

## 2020-04-20 ENCOUNTER — Other Ambulatory Visit: Payer: Self-pay | Admitting: Nurse Practitioner

## 2020-04-24 ENCOUNTER — Other Ambulatory Visit: Payer: Self-pay | Admitting: Nurse Practitioner

## 2020-04-28 ENCOUNTER — Other Ambulatory Visit: Payer: Self-pay | Admitting: Nurse Practitioner

## 2020-05-01 ENCOUNTER — Encounter: Payer: Self-pay | Admitting: Diagnostic Neuroimaging

## 2020-05-01 ENCOUNTER — Telehealth: Payer: Self-pay | Admitting: Diagnostic Neuroimaging

## 2020-05-01 ENCOUNTER — Ambulatory Visit: Payer: 59 | Admitting: Diagnostic Neuroimaging

## 2020-05-01 VITALS — BP 146/96 | HR 80 | Ht 67.5 in | Wt 216.0 lb

## 2020-05-01 DIAGNOSIS — D1800 Hemangioma unspecified site: Secondary | ICD-10-CM

## 2020-05-01 DIAGNOSIS — H532 Diplopia: Secondary | ICD-10-CM | POA: Diagnosis not present

## 2020-05-01 NOTE — Telephone Encounter (Signed)
[facility] order sent to GI . To be scheduled in April 2022

## 2020-05-01 NOTE — Patient Instructions (Signed)
  TRANSIENT DOUBLE VISION (? right CN6 palsy per ER notes on 04/14/20) - check labs  LEFT BRAIN LESION (? cavernoma; likely incidental finding) - repeat MRI brain w/wo (plan for April 2022)  CHRONIC SMALL VESSEL ISCHEMIC DISEASE - likely related to high BP; optimize nutrition, exercise and BP control  HYPERTENSION - establish with internal medicine PCP

## 2020-05-01 NOTE — Progress Notes (Signed)
[facility]  PATIENT: Connie Herrera DOB: Dec 24, 1975  REFERRING CLINICIAN: No ref. provider found HISTORY FROM: patient  REASON FOR VISIT: new consult    HISTORICAL  CHIEF COMPLAINT:  Chief Complaint  Patient presents with  . Diplopia    Rm 7 ED referral  "double vision gone but have blurry vision; MRI showed spot on my brain, that's why I am here"     HISTORY OF PRESENT ILLNESS:   45 year old female here for evaluation of transient double vision, headaches, abnormal MRI.  04/14/2020 patient went to the emergency room due to several days of blurred vision and double vision. Patient noticed horizontal double vision which seem to be coming from her right eye according to the patient. If she closed 1 eye double vision would go away. Patient went to the hospital for evaluation. CT of the head was obtained which showed hyperdensity in the left lentiform nuclei. Follow-up MRI brain confirmed a small enhancing lesion, possibly a cavernous malformation, but slightly atypical. Patient was noted to have right lateral rectus palsy on exam in the emergency room. She also had some hypertension. She was having some body aches and malaise. She had Covid testing done which was positive. She was discharged home with follow-up.  Since that time patient has noted her blood pressure has been staying high. Her Covid infection symptoms have improved. Her double vision symptoms also improved over several days.  Today patient was feeling some anxiety and stress related to this appointment. She has had some headache today.  Of note in 2013 patient had MRI of the brain which showed nonspecific T2 hyperintensities, and possibility of MS versus small vessel disease was raised. Apparently she enrolled in a clinical research study at that time and had lumbar puncture which was normal. And therefore MS was ruled out at that time.   REVIEW OF SYSTEMS: Full 14 system review of systems  performed and negative with exception of: As per HPI.  ALLERGIES: Allergies  Allergen Reactions  . Hydrocodone Nausea And Vomiting    Patient states that she can take medication it just makes her have bad nausea    HOME MEDICATIONS: Outpatient Medications Prior to Visit  Medication Sig Dispense Refill  . acyclovir (ZOVIRAX) 200 MG capsule Take 2 capsules (400 mg total) by mouth 3 (three) times daily. Take for 5 days prn each outbreak. 30 capsule 11  . hydrochlorothiazide (HYDRODIURIL) 25 MG tablet Take 1 tablet (25 mg total) by mouth daily. Must have office visit for refills 30 tablet 0  . ibuprofen (ADVIL) 600 MG tablet Take 1 tablet (600 mg total) by mouth every 6 (six) hours as needed. 30 tablet 1  . tranexamic acid (LYSTEDA) 650 MG TABS tablet Take 2 tablets (1,300 mg total) by mouth 3 (three) times daily. Take during menses for a maximum of five days 30 tablet 2  . potassium chloride (KLOR-CON 10) 10 MEQ tablet Take 1 tablet (10 mEq total) by mouth daily. (Patient not taking: No sig reported) 30 tablet 3   No facility-administered medications prior to visit.    PAST MEDICAL HISTORY: Past Medical History:  Diagnosis Date  . Brain lesion   . COVID-19 04/2020  . Diplopia   . Hypertension     PAST SURGICAL HISTORY: Past Surgical History:  Procedure Laterality Date  . TUBAL LIGATION      FAMILY HISTORY: Family History  Problem Relation Age of Onset  . Healthy Mother   . Healthy Father   .  Cancer Maternal Grandmother 65       breast  . Breast cancer Maternal Grandmother     SOCIAL HISTORY: Social History   Socioeconomic History  . Marital status: Single    Spouse name: Not on file  . Number of children: 4  . Years of education: 22  . Highest education level: Some college, no degree  Occupational History    Employer: [employer]    Comment: chef  Tobacco Use  . Smoking status: Former Smoker    Packs/day: 0.50    Years: 4.00    Pack years:  2.00    Types: Cigarettes    Quit date: 06/04/2017    Years since quitting: 2.9  . Smokeless tobacco: Never Used  Vaping Use  . Vaping Use: Never used  Substance and Sexual Activity  . Alcohol use: No  . Drug use: No  . Sexual activity: Never    Birth control/protection: Surgical  Other Topics Concern  . Not on file  Social History Narrative   Patient lives at home with children.    Patient has 4 children.    Patient is single.    Patient has 2 years of college.    Patient is right handed.    Patient is working full time. [facility].    Social Determinants of Health   Financial Resource Strain: Not on file  Food Insecurity: No Food Insecurity  . Worried About Charity fundraiser in the Last Year: Never true  . Ran Out of Food in the Last Year: Never true  Transportation Needs: No Transportation Needs  . Lack of Transportation (Medical): No  . Lack of Transportation (Non-Medical): No  Physical Activity: Not on file  Stress: Not on file  Social Connections: Not on file  Intimate Partner Violence: Not on file     PHYSICAL EXAM  GENERAL EXAM/CONSTITUTIONAL: Vitals:  Vitals:   05/01/20 0916  BP: (!) 146/96  Pulse: 80  Weight: 216 lb (98 kg)  Height: 5' 7.5" (1.715 m)     Body mass index is 33.33 kg/m. Wt Readings from Last 3 Encounters:  05/01/20 216 lb (98 kg)  03/04/20 217 lb 9.6 oz (98.7 kg)  01/09/20 214 lb 9.6 oz (97.3 kg)     Patient is in no distress; well developed, nourished and groomed; neck is supple  CARDIOVASCULAR:  Examination of carotid arteries is normal; no carotid bruits  Regular rate and rhythm, no murmurs  Examination of peripheral vascular system by observation and palpation is normal  EYES:  Ophthalmoscopic exam of optic discs and posterior segments is normal; no papilledema or hemorrhages  No exam data present  MUSCULOSKELETAL:  Gait, strength, tone, movements noted in Neurologic exam below  NEUROLOGIC: MENTAL  STATUS:  No flowsheet data found.  awake, alert, oriented to person, place and time  recent and remote memory intact  normal attention and concentration  language fluent, comprehension intact, naming intact  fund of knowledge appropriate  CRANIAL NERVE:   2nd - no papilledema on fundoscopic exam  2nd, 3rd, 4th, 6th - pupils equal and reactive to light, visual fields full to confrontation, extraocular muscles intact, no nystagmus  5th - facial sensation symmetric  7th - facial strength symmetric  8th - hearing intact  9th - palate elevates symmetrically, uvula midline  11th - shoulder shrug symmetric  12th - tongue protrusion midline  MOTOR:   normal bulk and tone, full strength in the BUE, BLE  SENSORY:  normal and symmetric to light touch, pinprick, temperature, vibration  COORDINATION:   finger-nose-finger, fine finger movements normal  REFLEXES:   deep tendon reflexes present and symmetric  GAIT/STATION:   narrow based gait    DIAGNOSTIC DATA (LABS, IMAGING, TESTING) - I reviewed patient records, labs, notes, testing and imaging myself where available.  Lab Results  Component Value Date   WBC 2.7 (L) 04/14/2020   HGB 11.9 (L) 04/14/2020   HCT 37.3 04/14/2020   MCV 81.3 04/14/2020   PLT 370 04/14/2020      Component Value Date/Time   NA 139 04/14/2020 1221   NA 140 01/09/2020 1351   K 3.5 04/14/2020 1221   CL 104 04/14/2020 1221   CO2 26 04/14/2020 1221   GLUCOSE 101 (H) 04/14/2020 1221   BUN 9 04/14/2020 1221   BUN 9 01/09/2020 1351   CREATININE 0.79 04/14/2020 1221   CALCIUM 8.7 (L) 04/14/2020 1221   PROT 7.1 01/09/2020 1351   ALBUMIN 4.3 01/09/2020 1351   AST 13 01/09/2020 1351   ALT 17 01/09/2020 1351   ALKPHOS 81 01/09/2020 1351   BILITOT <0.2 01/09/2020 1351   GFRNONAA >60 04/14/2020 1221   GFRAA 96 01/09/2020 1351   Lab Results  Component Value Date   CHOL 138 03/07/2019   HDL 30 (L) 03/07/2019   LDLCALC 87 03/07/2019    TRIG 115 03/07/2019   Lab Results  Component Value Date   HGBA1C 5.7 (H) 03/07/2019   No results found for: VITAMINB12 No results found for: TSH   01/09/2012 MRI brain without contrast - No acute infarction.  - Numerous foci of abnormal white matter signal within the cerebral  hemispheres. This raises concern for demyelinating  disease/multiple sclerosis. The differential diagnosis includes an  early manifestation small vessel disease, migraine related foci and  changes related old head trauma.    04/14/2020 MRI brain with and without [I reviewed images myself and agree with interpretation. This is a new finding compared to 2013.  -VRP]  1. 9 mm well-circumscribed lesion involving the lateral margin of the left lentiform nucleus, corresponding with finding on prior CT. 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 However, the associated enhancement about this lesion is somewhat greater and atypical for what is usually seen with these lesions. Given this, a short interval follow-up MRI, with and without contrast, in 3 months is recommended to document stability. 2. No other acute intracranial abnormality. 3. Mild T2/FLAIR hyperintensity involving the supratentorial cerebral white matter, nonspecific, but most commonly related to chronic microvascular ischemic disease.   ASSESSMENT AND PLAN  45 y.o. year old female here with 1 week of horizontal double vision in January 2022, with right lateral rectus palsy noted on exam in the emergency room, now resolved. Could represent microvascular scheming infarct to abducens nerve, related hypertension. We will proceed with further work-up with lab testing to evaluate for other etiologies. Also was found to have incidental left brain lesion, possible cavernous malformation, will follow up serial imaging. Also advised patient to establish with PCP and optimize medical risk factors, particularly hypertension.  Dx:  1.  Double vision with both eyes open   2. Cavernoma      PLAN:  TRANSIENT DOUBLE VISION (? right CN6 palsy per ER notes on 04/14/20) - check labs  LEFT BRAIN LESION (? cavernoma; likely incidental finding) - repeat MRI brain w/wo (plan for April 2022)  CHRONIC SMALL VESSEL ISCHEMIC DISEASE - likely related to high BP;  optimize nutrition, exercise and BP control  HYPERTENSION - establish with internal medicine PCP  Orders Placed This Encounter  Procedures  . MR BRAIN W WO CONTRAST  . CBC with Differential/Platelet  . Comprehensive metabolic panel  . AChR Abs with Reflex to MuSK  . Vitamin B12  . Hemoglobin A1c  . TSH   Return in about 4 months (around 08/29/2020).    Penni Bombard, MD [id number], [id number] AM Certified in Neurology, Neurophysiology and Neuroimaging  [facility] [address] [place], [place] [address] [phone]

## 2020-05-02 ENCOUNTER — Other Ambulatory Visit: Payer: Self-pay | Admitting: Nurse Practitioner

## 2020-06-03 ENCOUNTER — Other Ambulatory Visit: Payer: Self-pay | Admitting: Family Medicine

## 2020-06-03 DIAGNOSIS — I10 Essential (primary) hypertension: Secondary | ICD-10-CM

## 2020-06-20 ENCOUNTER — Encounter: Payer: Self-pay | Admitting: Physician Assistant

## 2020-06-20 ENCOUNTER — Telehealth: Payer: 59 | Admitting: Physician Assistant

## 2020-06-20 VITALS — BP 160/100

## 2020-06-20 DIAGNOSIS — M62838 Other muscle spasm: Secondary | ICD-10-CM

## 2020-06-20 DIAGNOSIS — I10 Essential (primary) hypertension: Secondary | ICD-10-CM | POA: Diagnosis not present

## 2020-06-20 MED ORDER — HYDROCHLOROTHIAZIDE 25 MG PO TABS: 25.0000 mg | ORAL_TABLET | Freq: Every day | ORAL | 0 refills | Status: DC

## 2020-06-20 MED ORDER — POTASSIUM CHLORIDE ER 10 MEQ PO TBCR
10.0000 meq | EXTENDED_RELEASE_TABLET | Freq: Every day | ORAL | 0 refills | Status: DC
Start: 2020-06-20 — End: 2020-09-30

## 2020-06-20 NOTE — Progress Notes (Signed)
[name], Connie Herrera are scheduled for a virtual visit with your provider today.    Just as we do with appointments in the office, we must obtain your consent to participate.  Your consent will be active for this visit and any virtual visit you may have with one of our providers in the next 365 days.    If you have a MyChart account, I can also send a copy of this consent to you electronically.  All virtual visits are billed to your insurance company just like a traditional visit in the office.  As this is a virtual visit, video technology does not allow for your provider to perform a traditional examination.  This may limit your provider's ability to fully assess your condition.  If your provider identifies any concerns that need to be evaluated in person or the need to arrange testing such as labs, EKG, etc, we will make arrangements to do so.    Although advances in technology are sophisticated, we cannot ensure that it will always work on either your end or our end.  If the connection with a video visit is poor, we may have to switch to a telephone visit.  With either a video or telephone visit, we are not always able to ensure that we have a secure connection.   I need to obtain your verbal consent now.   Are you willing to proceed with your visit today?   Connie Herrera has provided verbal consent on 06/20/2020 for a virtual visit (video or telephone).  Leeanne Rio, PA-C 06/20/2020  4:43 PM   Virtual Visit via Video   I connected with patient on 06/20/20 at  4:45 PM EDT by a video enabled telemedicine application and verified that I am speaking with the correct person using two identifiers.  Location patient: Home Location provider: [place] participating in the virtual visit: Patient, Provider  I discussed the limitations of evaluation and management by telemedicine and the availability of in person appointments. The patient expressed understanding  and agreed to proceed.  Subjective:   HPI:   Patient presents via [facility] today to discuss recent elevation in her BP medications associated with mild swelling of her feet and ankles, bilaterally. Patient with documented history of hypertension, previously controlled with hydrochlorothiazide 25 mg daily. Notes she is overdue for follow-up with her previous primary care provider but lost insurance for a time. Recently got new insurance and has an appointment with a new PCP in 1.5 weeks. Her new PCP nor her old PCP will refill her medicine. Notes she had been taking medicine QOD to make it last longer but has been without medication now for 4-5 days. Has noted increase in BP with a mild headache. BP today at 160/100. Denies any chest pain, palpitations, lightheadedness or dizziness..Denies vision change. Notes in the past month her diet has not been good, eating a lot of foods higher in fat and salt. Since out of her medicine she has been trying to really watch what she eats and keeping well-hydrated. Denies any other recent change to lifestyle, stress, etc. Notes she did stop smoking recently as well.   ROS:   See pertinent positives and negatives per HPI.  Patient Active Problem List   Diagnosis Date Noted  . Anemia due to blood loss, chronic 01/10/2020  . Menorrhagia with regular cycle 01/09/2020  . BMI 33.0-33.9,adult 01/09/2020  . Screening breast examination 02/07/2019  . Breast pain 02/07/2019  . Hypertension  01/26/2019  . Paresthesias 08/04/2013  . Breast pain, right 03/11/2012    Social History   Tobacco Use  . Smoking status: Former Smoker    Packs/day: 0.50    Years: 4.00    Pack years: 2.00    Types: Cigarettes    Quit date: 06/04/2017    Years since quitting: 3.0  . Smokeless tobacco: Never Used  Substance Use Topics  . Alcohol use: No    Current Outpatient Medications:  .  acyclovir (ZOVIRAX) 200 MG capsule, Take 2 capsules (400 mg total) by mouth 3 (three) times  daily. Take for 5 days prn each outbreak., Disp: 30 capsule, Rfl: 11 .  hydrochlorothiazide (HYDRODIURIL) 25 MG tablet, Take 1 tablet (25 mg total) by mouth daily. Must have office visit for refills, Disp: 30 tablet, Rfl: 0 .  ibuprofen (ADVIL) 600 MG tablet, Take 1 tablet (600 mg total) by mouth every 6 (six) hours as needed., Disp: 30 tablet, Rfl: 1 .  potassium chloride (KLOR-CON 10) 10 MEQ tablet, Take 1 tablet (10 mEq total) by mouth daily. (Patient not taking: No sig reported), Disp: 30 tablet, Rfl: 3 .  tranexamic acid (LYSTEDA) 650 MG TABS tablet, Take 2 tablets (1,300 mg total) by mouth 3 (three) times daily. Take during menses for a maximum of five days, Disp: 30 tablet, Rfl: 2  Allergies  Allergen Reactions  . Hydrocodone Nausea And Vomiting    Patient states that she can take medication it just makes her have bad nausea    Objective:   There were no vitals taken for this visit.  Patient is well-developed, well-nourished in no acute distress.  Resting comfortably at home.  Head is normocephalic, atraumatic.  No labored breathing.  Speech is clear and coherent with logical content.  Patient is alert and oriented at baseline.   Assessment and Plan:   1. Hypertension, unspecified type Reviewed DASH diet with patient. Will have her keep hydrating, drinking mainly water. HCTZ 25 mg refilled to the pharmacy (41-month supply given) for her to restart taking along with Klor Con once daily to help prevent change of hypokalemia until her follow-up with new PCP. She is to check BP daily and record, taking to her appointment with new PCP for review and further management. Strict ER precautions reviewed with patient. Written instructions sent to patient's MyChart.     Leeanne Rio, PA-C 06/20/2020

## 2020-06-20 NOTE — Patient Instructions (Signed)
Instructions sent to patients MyChart.

## 2020-06-25 IMAGING — MG DIGITAL DIAGNOSTIC BILAT W/ TOMO
6 of 10 series · 6 of 30 positions shown · non-contrast
Comparison: Previous exam(s).

CLINICAL DATA: 42-year-old female presenting for evaluation of a
palpable lump in the left breast.

EXAM:
DIGITAL DIAGNOSTIC BILATERAL MAMMOGRAM WITH CAD AND TOMO
ULTRASOUND LEFT BREAST

[L TAN synth-2D]
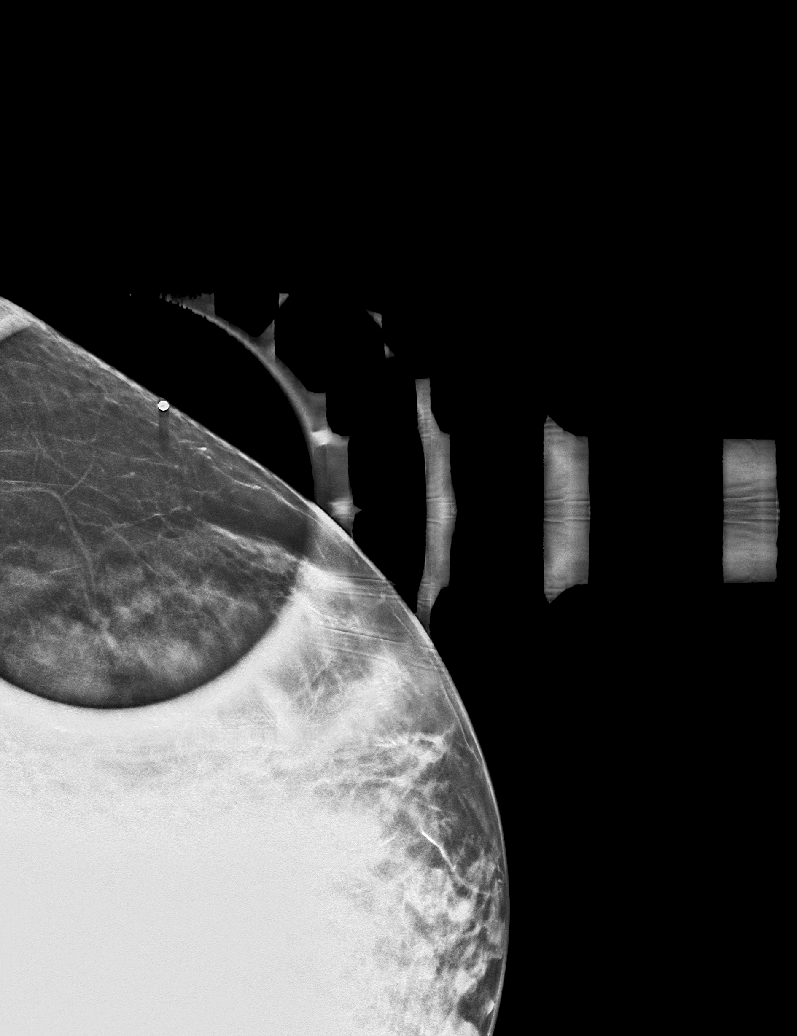

[R CC synth-2D]
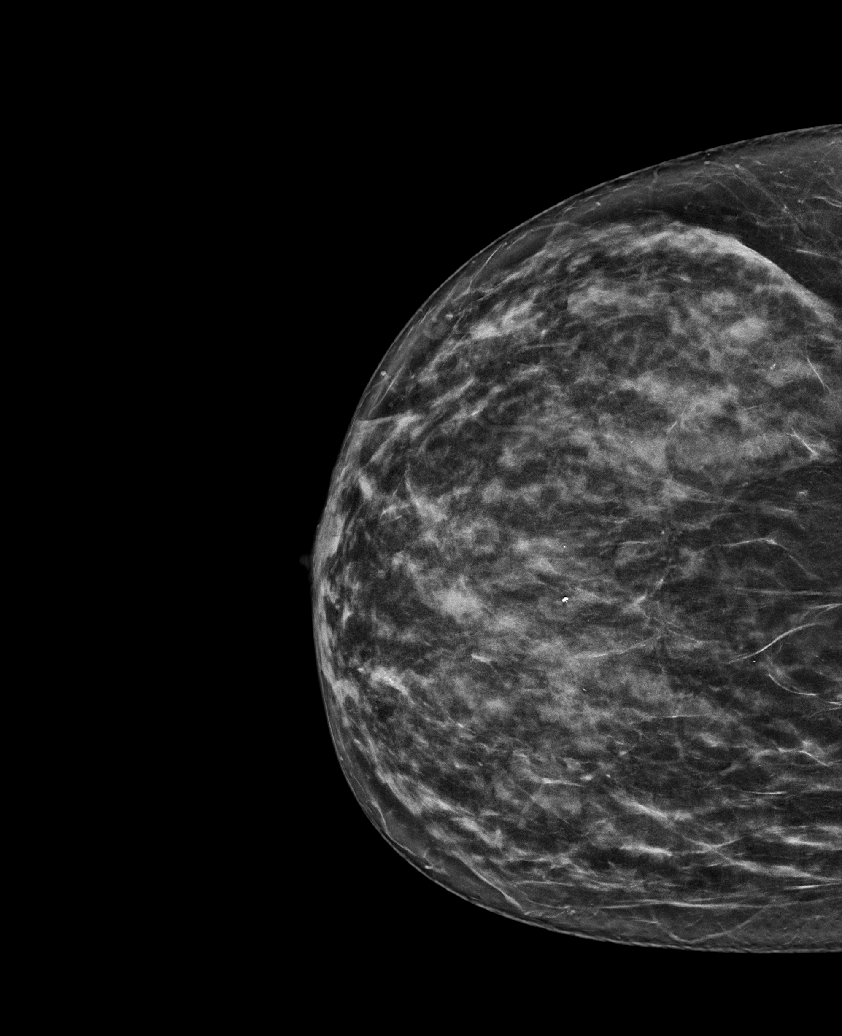

[L CC synth-2D]
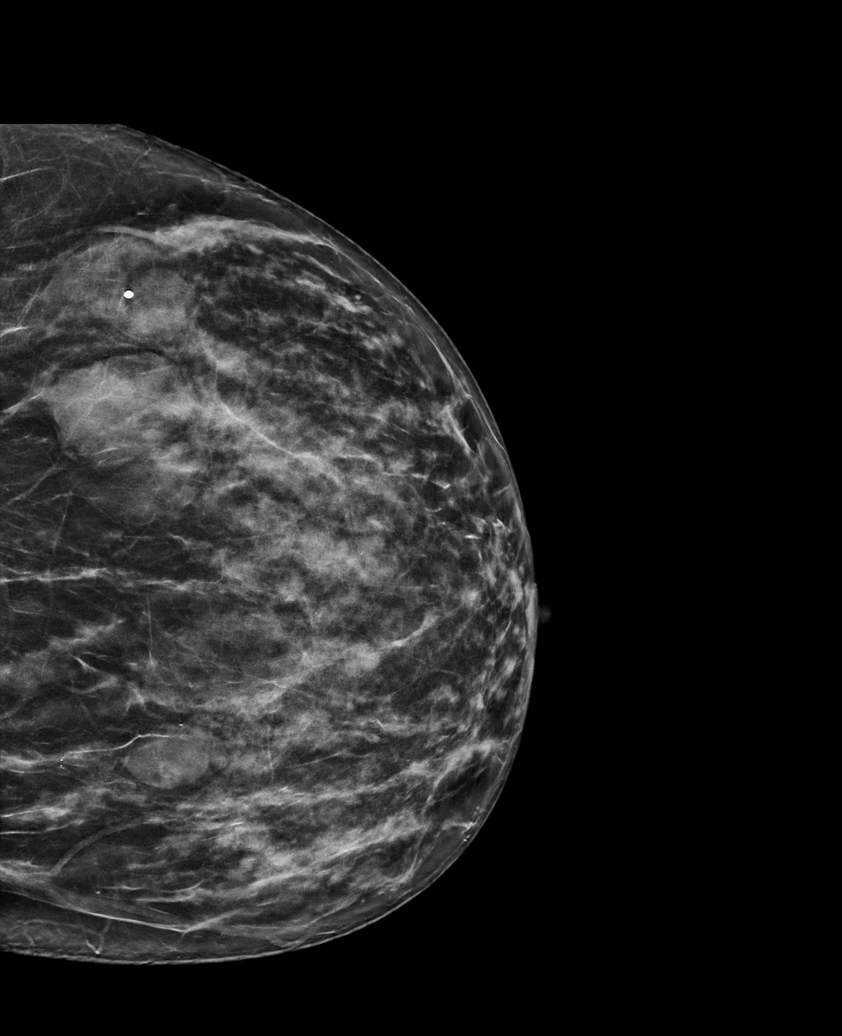

[R MLO synth-2D]
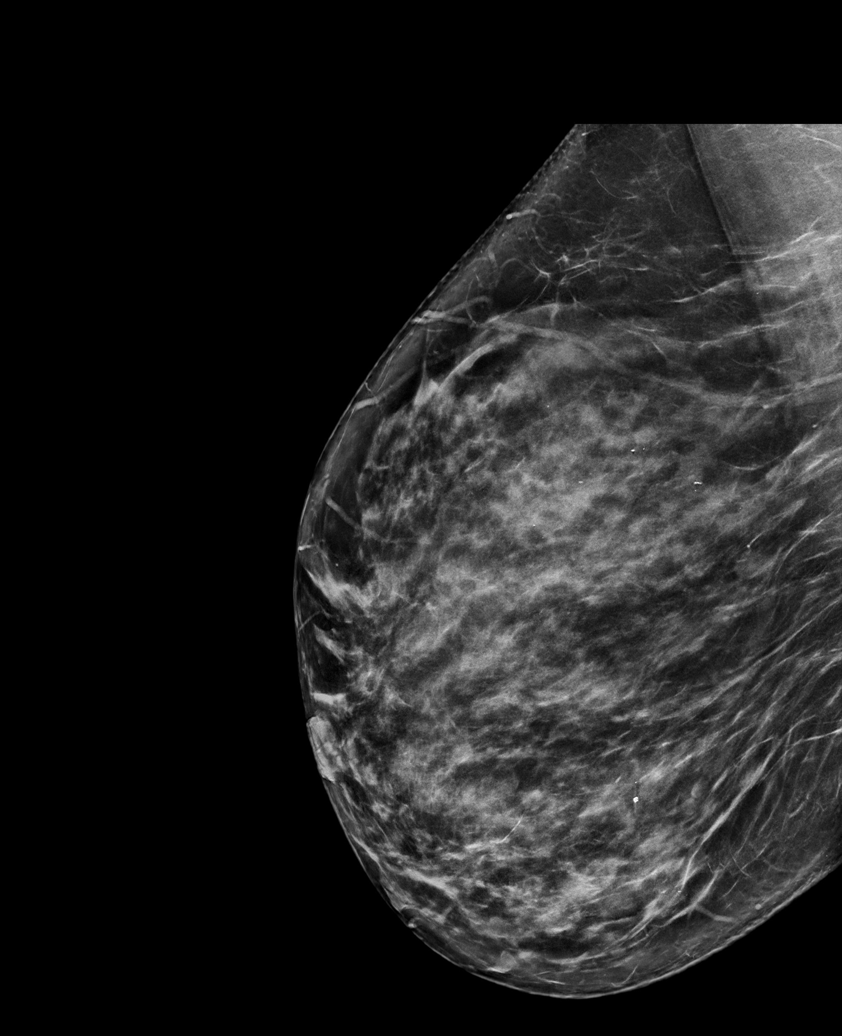

[L MLO synth-2D]
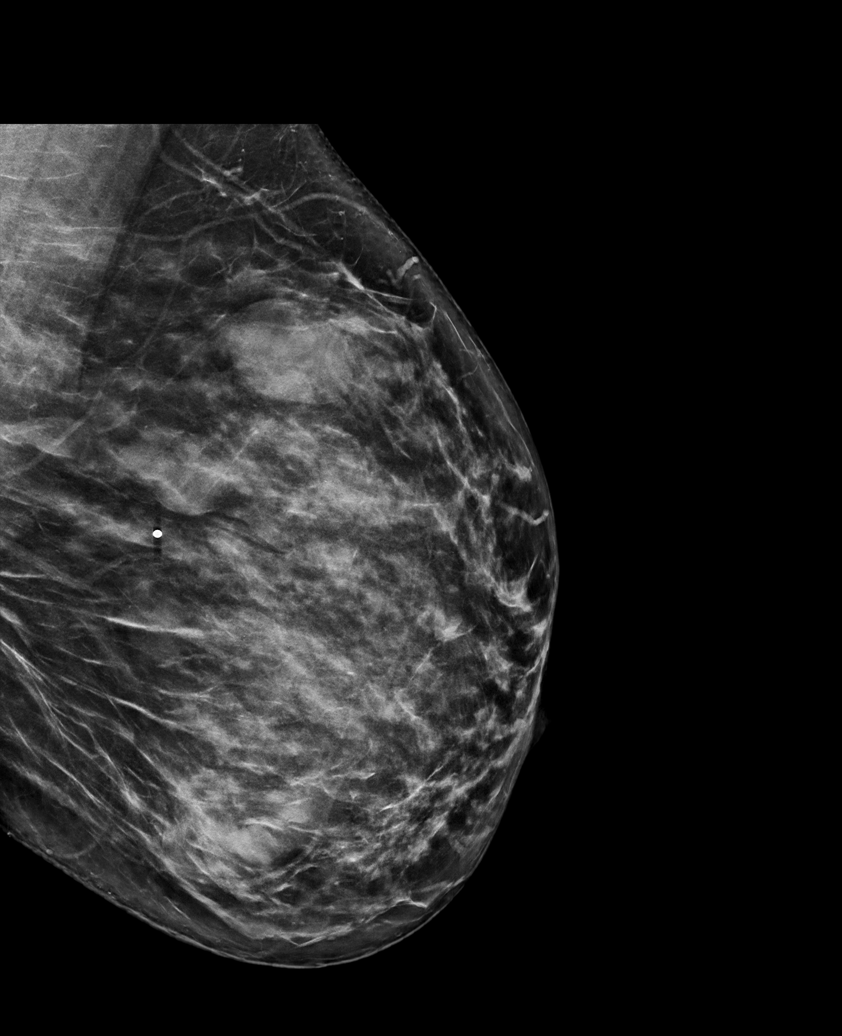

[R CC tomo · tomo slice 37/73.0]
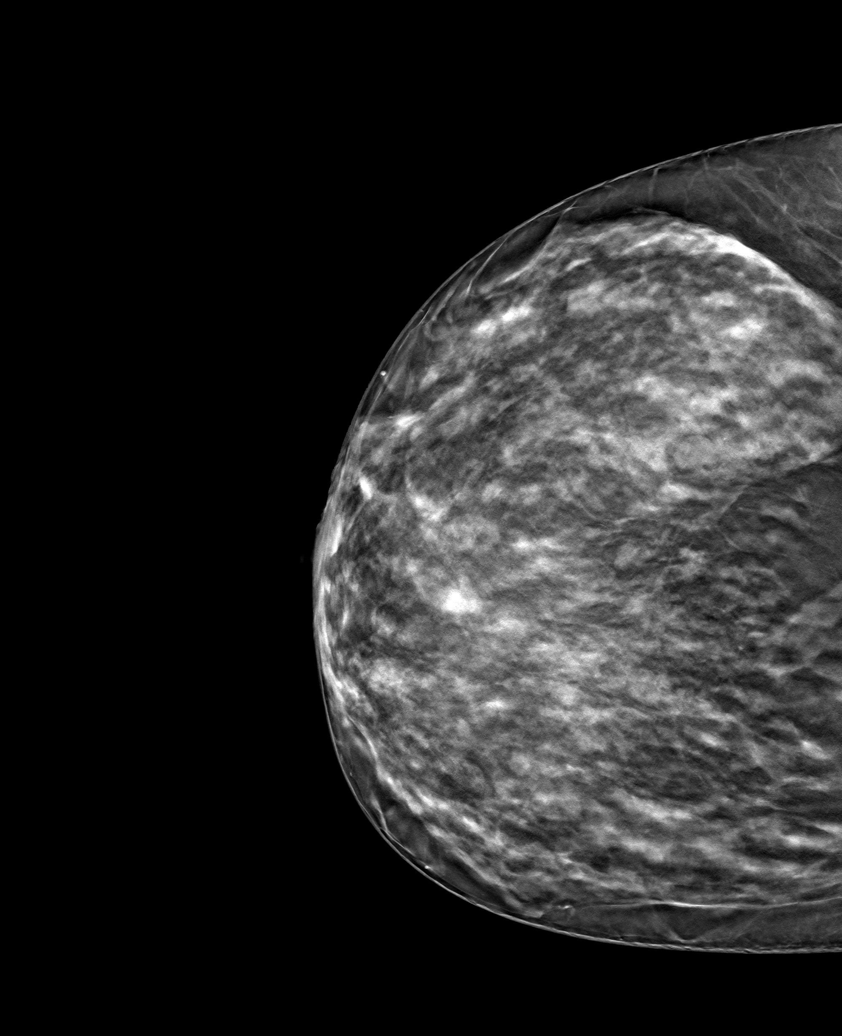

[6 of 30 positions shown; findings below may reference images not displayed]

ACR Breast Density Category c: The breast tissue is heterogeneously
dense, which may obscure small masses.
FINDINGS: There are multiple bilateral oval circumscribed masses consistent
benign fibrocystic change. The palpable marker has been placed along
the lower outer left breast. There are benign masses, likely cysts,
within the breast tissue in the vicinity of this marker. No
suspicious masses are identified. No suspicious calcifications,
masses or areas of distortion are seen in the bilateral breasts.

Mammographic images were processed with CAD.

Ultrasound targeted to the palpable site in the left breast at 4
o'clock and 9 cm from the nipple demonstrates a hypoechoic lesion
within the skin measuring 1.3 x 0.3 x 1.4 cm. A similar smaller
lesion is seen at 4 o'clock, 7 cm from the nipple measuring 0.7 x
0.2 x 0.5 cm.
IMPRESSION: 1. There are 2 benign sebaceous cysts in the lower outer quadrant of
the left breast which corresponds with the palpable area of concern.

2.  No mammographic evidence of malignancy in the bilateral breasts.

RECOMMENDATION:
Screening mammogram in one year.(Code:G6-9-DKY)

I have discussed the findings and recommendations with the patient.
If applicable, a reminder letter will be sent to the patient
regarding the next appointment.

BI-RADS CATEGORY  2: Benign.

## 2020-06-27 NOTE — Telephone Encounter (Signed)
[employer] auth: [id number] (exp. 06/27/20 to 07/26/20) patient is scheduled at GI for 07/11/20.

## 2020-07-08 ENCOUNTER — Encounter: Payer: Self-pay | Admitting: General Practice

## 2020-07-10 ENCOUNTER — Other Ambulatory Visit: Payer: 59

## 2020-07-11 ENCOUNTER — Ambulatory Visit
Admission: RE | Admit: 2020-07-11 | Discharge: 2020-07-11 | Disposition: A | Discharge status: Home / Self Care | Payer: 59 | Source: Ambulatory Visit | Attending: Diagnostic Neuroimaging | Admitting: Diagnostic Neuroimaging

## 2020-07-11 DIAGNOSIS — D1800 Hemangioma unspecified site: Secondary | ICD-10-CM

## 2020-07-11 MED ORDER — GADOBENATE DIMEGLUMINE 529 MG/ML IV SOLN
20.0000 mL | Freq: Once | INTRAVENOUS | Status: AC | PRN
  Administered 2020-07-11: 20 mL via INTRAVENOUS

## 2020-07-17 ENCOUNTER — Telehealth: Payer: Self-pay | Admitting: Diagnostic Neuroimaging

## 2020-07-17 NOTE — Telephone Encounter (Signed)
Pt called wanting to know when someone will call her to explain the MRI results she received on her mychart. Please advise.

## 2020-07-18 ENCOUNTER — Encounter: Payer: Self-pay | Admitting: *Deleted

## 2020-07-18 NOTE — Telephone Encounter (Signed)
VMB not set up, sent my chart with MD result note.

## 2020-07-18 NOTE — Telephone Encounter (Signed)
Stable mri. Monitor. -VRP

## 2020-08-11 ENCOUNTER — Ambulatory Visit (HOSPITAL_COMMUNITY)
Admission: EM | Admit: 2020-08-11 | Discharge: 2020-08-11 | Disposition: A | Discharge status: Home / Self Care | Payer: 59 | Attending: Physician Assistant | Admitting: Physician Assistant

## 2020-08-11 ENCOUNTER — Other Ambulatory Visit: Payer: Self-pay

## 2020-08-11 ENCOUNTER — Encounter (HOSPITAL_COMMUNITY): Payer: Self-pay

## 2020-08-11 DIAGNOSIS — M62838 Other muscle spasm: Secondary | ICD-10-CM | POA: Diagnosis not present

## 2020-08-11 LAB — COMPREHENSIVE METABOLIC PANEL
ALT: 16 U/L (ref 0–44)
AST: 15 U/L (ref 15–41)
Albumin: 3.7 g/dL (ref 3.5–5.0)
Alkaline Phosphatase: 63 U/L (ref 38–126)
Anion gap: 8 (ref 5–15)
BUN: 5 mg/dL — ABNORMAL LOW (ref 6–20)
CO2: 28 mmol/L (ref 22–32)
Calcium: 9.2 mg/dL (ref 8.9–10.3)
Chloride: 102 mmol/L (ref 98–111)
Creatinine, Ser: 0.77 mg/dL (ref 0.44–1.00)
GFR, Estimated: >60 mL/min (ref >60)
Glucose, Bld: 96 mg/dL (ref 70–99)
Potassium: 3.9 mmol/L (ref 3.5–5.1)
Sodium: 138 mmol/L (ref 135–145)
Total Bilirubin: 0.4 mg/dL (ref 0.3–1.2)
Total Protein: 7.2 g/dL (ref 6.5–8.1)

## 2020-08-11 LAB — CBC
HCT: 36 % (ref 36.0–46.0)
Hemoglobin: 11.1 g/dL — ABNORMAL LOW (ref 12.0–15.0)
MCH: 24.6 pg — ABNORMAL LOW (ref 26.0–34.0)
MCHC: 30.8 g/dL (ref 30.0–36.0)
MCV: 79.6 fL — ABNORMAL LOW (ref 80.0–100.0)
Platelets: 538 10*3/uL — ABNORMAL HIGH (ref 150–400)
RBC: 4.52 MIL/uL (ref 3.87–5.11)
RDW: 15 % (ref 11.5–15.5)
WBC: 8.5 10*3/uL (ref 4.0–10.5)
nRBC: 0 % (ref 0.0–0.2)

## 2020-08-11 LAB — CK: Total CK: 81 U/L (ref 38–234)

## 2020-08-11 MED ORDER — KETOROLAC TROMETHAMINE 60 MG/2ML IM SOLN
INTRAMUSCULAR | Status: AC
  Filled 2020-08-11: qty 2

## 2020-08-11 MED ORDER — KETOROLAC TROMETHAMINE 60 MG/2ML IM SOLN
60.0000 mg | Freq: Once | INTRAMUSCULAR | Status: AC
  Administered 2020-08-11: 60 mg via INTRAMUSCULAR

## 2020-08-11 MED ORDER — CYCLOBENZAPRINE HCL 10 MG PO TABS: 10.0000 mg | ORAL_TABLET | Freq: Three times a day (TID) | ORAL | 0 refills | Status: DC | PRN

## 2020-08-11 NOTE — ED Provider Notes (Signed)
[facility]    CSN: [id number] Arrival date & time: 08/11/20  1005      History   Chief Complaint Chief Complaint  Patient presents with  . Spasms    HPI Connie Herrera is a 45 y.o. female.   Patient presents today with a weeklong history of lower extremity muscle spasms that have worsened in the past 24 hours.  She believes this is related to an electrolyte imbalance that she is recently started hydrochlorothiazide and potassium and believes that she missed several doses of potassium.  She denies history of similar symptoms in the past.  She has not tried any over-the-counter medications for symptom management.  She denies any fever, chest pain, nausea, vomiting, weakness.  Denies history of spinal injury or previous surgery.  Denies any associated back pain.  Reports pain is 10 on a 0-10 pain scale, localized to posterior legs bilaterally, described as tightness/spasm, no alleviating factors identified.  She is having difficulty with daily activities as result of symptoms.  Patient has no concern for pregnancy.     Past Medical History:  Diagnosis Date  . Brain lesion   . COVID-19 04/2020  . Diplopia   . Hypertension     Patient Active Problem List   Diagnosis Date Noted  . Anemia due to blood loss, chronic 01/10/2020  . Menorrhagia with regular cycle 01/09/2020  . BMI 33.0-33.9,adult 01/09/2020  . Screening breast examination 02/07/2019  . Breast pain 02/07/2019  . Hypertension 01/26/2019  . Paresthesias 08/04/2013  . Breast pain, right 03/11/2012    Past Surgical History:  Procedure Laterality Date  . TUBAL LIGATION      OB History    Gravida  4   Para  4   Term  4   Preterm  0   AB  0   Living  4     SAB  0   IAB  0   Ectopic  0   Multiple  0   Live Births  4            Home Medications    Prior to Admission medications   Medication Sig Start Date End Date Taking? Authorizing Provider  cyclobenzaprine (FLEXERIL)  10 MG tablet Take 1 tablet (10 mg total) by mouth 3 (three) times daily as needed for muscle spasms. 08/11/20  Yes Daneille Desilva K, PA-C  acyclovir (ZOVIRAX) 200 MG capsule Take 2 capsules (400 mg total) by mouth 3 (three) times daily. Take for 5 days prn each outbreak. 03/18/20   Shelly Bombard, MD  hydrochlorothiazide (HYDRODIURIL) 25 MG tablet Take 1 tablet (25 mg total) by mouth daily. Must have office visit for refills 06/20/20   Brunetta Jeans, PA-C  potassium chloride (KLOR-CON 10) 10 MEQ tablet Take 1 tablet (10 mEq total) by mouth daily. 06/20/20   Brunetta Jeans, PA-C    Family History Family History  Problem Relation Age of Onset  . Healthy Mother   . Healthy Father   . Cancer Maternal Grandmother 33       breast  . Breast cancer Maternal Grandmother     Social History Social History   Tobacco Use  . Smoking status: Former Smoker    Packs/day: 0.50    Years: 4.00    Pack years: 2.00    Types: Cigarettes    Quit date: 06/04/2017    Years since quitting: 3.1  . Smokeless tobacco: Never Used  Vaping Use  . Vaping Use:  Never used  Substance Use Topics  . Alcohol use: No  . Drug use: No     Allergies   Hydrocodone   Review of Systems Review of Systems  Constitutional: Positive for activity change. Negative for appetite change, fatigue and fever.  Respiratory: Negative for cough and shortness of breath.   Cardiovascular: Negative for chest pain.  Gastrointestinal: Negative for abdominal pain, diarrhea, nausea and vomiting.  Musculoskeletal: Positive for myalgias. Negative for arthralgias.  Neurological: Negative for dizziness, light-headedness and headaches.     Physical Exam Triage Vital Signs ED Triage Vitals  Enc Vitals Group     BP 08/11/20 1022 132/84     Pulse Rate 08/11/20 1022 88     Resp 08/11/20 1022 18     Temp 08/11/20 1022 98.1 F (36.7 C)     Temp Source 08/11/20 1022 Oral     SpO2 08/11/20 1022 100 %     Weight --      Height --       Head Circumference --      Peak Flow --      Pain Score 08/11/20 1020 10     Pain Loc --      Pain Edu? --      Excl. in [place]? --    No data found.  Updated Vital Signs BP 132/84 (BP Location: Left Arm)   Pulse 88   Temp 98.1 F (36.7 C) (Oral)   Resp 18   LMP 08/01/2020 (Exact Date)   SpO2 100%   Visual Acuity Right Eye Distance:   Left Eye Distance:   Bilateral Distance:    Right Eye Near:   Left Eye Near:    Bilateral Near:     Physical Exam Vitals reviewed.  Constitutional:      General: She is awake. She is not in acute distress.    Appearance: Normal appearance. She is not ill-appearing.     Comments: Very pleasant female laying across exam table in obvious discomfort  HENT:     Head: Normocephalic and atraumatic.  Cardiovascular:     Rate and Rhythm: Normal rate and regular rhythm.     Heart sounds: No murmur heard.   Pulmonary:     Effort: Pulmonary effort is normal.     Breath sounds: Normal breath sounds. No wheezing, rhonchi or rales.     Comments: Clear to auscultation bilaterally Abdominal:     General: Bowel sounds are normal.     Palpations: Abdomen is soft.     Tenderness: There is no abdominal tenderness. There is no right CVA tenderness, left CVA tenderness, guarding or rebound.  Musculoskeletal:     Cervical back: No tenderness or bony tenderness.     Thoracic back: No tenderness or bony tenderness.     Lumbar back: No tenderness or bony tenderness.     Comments: Tenderness palpation of posterior lower extremity musculature.  Psychiatric:        Behavior: Behavior is cooperative.      UC Treatments / Results  Labs (all labs ordered are listed, but only abnormal results are displayed) Labs Reviewed  CBC  COMPREHENSIVE METABOLIC PANEL  CK    EKG   Radiology No results found.  Procedures Procedures (including critical care time)  Medications Ordered in UC Medications  ketorolac (TORADOL) injection 60 mg (has no  administration in time range)    Initial Impression / Assessment and Plan / UC Course  I have reviewed the triage vital  signs and the nursing notes.  Pertinent labs & imaging results that were available during my care of the patient were reviewed by me and considered in my medical decision making (see chart for details).     Patient was given Toradol injection in office today.  Patient was prescribed Flexeril to be taken up to 3 times a day with instruction not to drive or drink alcohol with this medication as drowsiness is a common side effect.  CBC, CMP, CK obtained today-results pending.  She was encouraged to drink plenty of fluid.  Recommended heat and stretch for additional symptom relief.  Strict return precautions given to which patient expressed understanding.  Final Clinical Impressions(s) / UC Diagnoses   Final diagnoses:  Muscle spasm     Discharge Instructions     We gave you Toradol today.  You can use Tylenol for additional pain relief and starting tomorrow can alternate ibuprofen and Tylenol.  We will be in touch with your lab results if we need to do anything else.  Please make sure you are drinking plenty of fluid.  Use Flexeril up to 3 times a day as needed.  This will make you very sleepy so do not drive or drink alcohol while taking this medication.  If anything worsens please return for reevaluation.  Follow-up with PCP next week regarding blood pressure medication.    ED Prescriptions    Medication Sig Dispense Auth. Provider   cyclobenzaprine (FLEXERIL) 10 MG tablet Take 1 tablet (10 mg total) by mouth 3 (three) times daily as needed for muscle spasms. 30 tablet Bettyanne Dittman, Derry Skill, PA-C     PDMP not reviewed this encounter.   Terrilee Croak, PA-C 08/11/20 1044

## 2020-08-11 NOTE — ED Triage Notes (Signed)
Pt c/o spasms x 1 week. She states throughout the week is has gotten worse.

## 2020-08-11 NOTE — Discharge Instructions (Signed)
We gave you Toradol today.  You can use Tylenol for additional pain relief and starting tomorrow can alternate ibuprofen and Tylenol.  We will be in touch with your lab results if we need to do anything else.  Please make sure you are drinking plenty of fluid.  Use Flexeril up to 3 times a day as needed.  This will make you very sleepy so do not drive or drink alcohol while taking this medication.  If anything worsens please return for reevaluation.  Follow-up with PCP next week regarding blood pressure medication.

## 2020-08-12 ENCOUNTER — Other Ambulatory Visit: Payer: Self-pay

## 2020-08-12 ENCOUNTER — Encounter (HOSPITAL_COMMUNITY): Payer: Self-pay

## 2020-08-12 ENCOUNTER — Emergency Department (HOSPITAL_COMMUNITY)
Admission: EM | Admit: 2020-08-12 | Discharge: 2020-08-12 | Disposition: A | Discharge status: Home / Self Care | Payer: 59 | Attending: Emergency Medicine | Admitting: Emergency Medicine

## 2020-08-12 ENCOUNTER — Emergency Department (HOSPITAL_COMMUNITY)
Admission: EM | Admit: 2020-08-12 | Discharge: 2020-08-12 | Disposition: A | Discharge status: Home / Self Care | Payer: 59 | Source: Home / Self Care

## 2020-08-12 DIAGNOSIS — Z5321 Procedure and treatment not carried out due to patient leaving prior to being seen by health care provider: Secondary | ICD-10-CM | POA: Insufficient documentation

## 2020-08-12 DIAGNOSIS — I10 Essential (primary) hypertension: Secondary | ICD-10-CM | POA: Diagnosis not present

## 2020-08-12 DIAGNOSIS — R252 Cramp and spasm: Secondary | ICD-10-CM | POA: Diagnosis not present

## 2020-08-12 DIAGNOSIS — M791 Myalgia, unspecified site: Secondary | ICD-10-CM | POA: Diagnosis present

## 2020-08-12 DIAGNOSIS — Z8616 Personal history of COVID-19: Secondary | ICD-10-CM | POA: Diagnosis not present

## 2020-08-12 DIAGNOSIS — Z79899 Other long term (current) drug therapy: Secondary | ICD-10-CM | POA: Insufficient documentation

## 2020-08-12 DIAGNOSIS — M79606 Pain in leg, unspecified: Secondary | ICD-10-CM | POA: Insufficient documentation

## 2020-08-12 DIAGNOSIS — Z87891 Personal history of nicotine dependence: Secondary | ICD-10-CM | POA: Insufficient documentation

## 2020-08-12 MED ORDER — KETOROLAC TROMETHAMINE 15 MG/ML IJ SOLN
15.0000 mg | Freq: Once | INTRAMUSCULAR | Status: AC
  Administered 2020-08-12: 15 mg via INTRAMUSCULAR
  Filled 2020-08-12: qty 1

## 2020-08-12 MED ORDER — METHOCARBAMOL 500 MG PO TABS: 500.0000 mg | ORAL_TABLET | Freq: Two times a day (BID) | ORAL | 0 refills | Status: DC | PRN

## 2020-08-12 MED ORDER — NAPROXEN 500 MG PO TABS: 500.0000 mg | ORAL_TABLET | Freq: Two times a day (BID) | ORAL | 0 refills | Status: DC

## 2020-08-12 MED ORDER — METHOCARBAMOL 500 MG PO TABS
750.0000 mg | ORAL_TABLET | Freq: Once | ORAL | Status: AC
  Administered 2020-08-12: 750 mg via ORAL
  Filled 2020-08-12: qty 2

## 2020-08-12 NOTE — ED Triage Notes (Signed)
Patient c/o muscle spasms x 1 week. Patient was seen at [facility]. Patient crying and begging for pain meds. Patient states, "I'm allergic to Percocet."

## 2020-08-12 NOTE — ED Notes (Signed)
Patient crying loudly and cursing at writer because she was not receiving any pain meds while writer was in the room.

## 2020-08-12 NOTE — ED Notes (Signed)
Patient seen walking out of emergency department by multiple staff members stating " I will just go home and try the medication that was prescribed to me."

## 2020-08-12 NOTE — ED Provider Notes (Signed)
[facility] DEPT Provider Note   CSN: [id number] Arrival date & time: 08/12/20  1244     History Chief Complaint  Patient presents with  . Spasms    Connie Herrera is a 45 y.o. female presented for evaluation of generalized body aches and spasms.  Patient states her symptoms began yesterday.  She was seen at urgent care after her pain began, had overall normal work-up.  She was given muscle relaxer which helped with minimal symptom control, but her pain is still severe today.  She denies recent change in activity including recent increase in exercise or heavy lifting.  No recent medication changes.  No change in diet or sleep.  She denies sick contacts.  She denies fevers, chills, chest pain, shortness of breath, cough, nausea, vomit, abdominal pain, urinary symptoms, normal bowel movements.  Her last period was 2 weeks ago, was normal for her.  She reports generalized body aches, mostly in her arms and her legs which she describes as a cramp or spasm that last for few seconds and then resolves.  She has not taken anything including Tylenol or ibuprofen.  Symptoms are worsened different positions, such as her legs hurt worse when they are dangling as opposed when they are elevated.  Nothing makes her symptoms better  HPI     Past Medical History:  Diagnosis Date  . Brain lesion   . COVID-19 04/2020  . Diplopia   . Hypertension     Patient Active Problem List   Diagnosis Date Noted  . Anemia due to blood loss, chronic 01/10/2020  . Menorrhagia with regular cycle 01/09/2020  . BMI 33.0-33.9,adult 01/09/2020  . Screening breast examination 02/07/2019  . Breast pain 02/07/2019  . Hypertension 01/26/2019  . Paresthesias 08/04/2013  . Breast pain, right 03/11/2012    Past Surgical History:  Procedure Laterality Date  . TUBAL LIGATION       OB History    Gravida  4   Para  4   Term  4   Preterm  0   AB  0   Living  4     SAB  0    IAB  0   Ectopic  0   Multiple  0   Live Births  4           Family History  Problem Relation Age of Onset  . Healthy Mother   . Healthy Father   . Cancer Maternal Grandmother 96       breast  . Breast cancer Maternal Grandmother     Social History   Tobacco Use  . Smoking status: Former Smoker    Packs/day: 0.50    Years: 4.00    Pack years: 2.00    Types: Cigarettes    Quit date: 06/04/2017    Years since quitting: 3.1  . Smokeless tobacco: Never Used  Vaping Use  . Vaping Use: Never used  Substance Use Topics  . Alcohol use: No  . Drug use: No    Home Medications Prior to Admission medications   Medication Sig Start Date End Date Taking? Authorizing Provider  methocarbamol (ROBAXIN) 500 MG tablet Take 1 tablet (500 mg total) by mouth 2 (two) times daily as needed for muscle spasms. 08/12/20  Yes Klani Caridi, PA-C  naproxen (NAPROSYN) 500 MG tablet Take 1 tablet (500 mg total) by mouth 2 (two) times daily with a meal. 08/12/20  Yes Eryanna Regal, PA-C  acyclovir (ZOVIRAX) 200 MG  capsule Take 2 capsules (400 mg total) by mouth 3 (three) times daily. Take for 5 days prn each outbreak. 03/18/20   Shelly Bombard, MD  cyclobenzaprine (FLEXERIL) 10 MG tablet Take 1 tablet (10 mg total) by mouth 3 (three) times daily as needed for muscle spasms. 08/11/20   Raspet, Derry Skill, PA-C  hydrochlorothiazide (HYDRODIURIL) 25 MG tablet Take 1 tablet (25 mg total) by mouth daily. Must have office visit for refills 06/20/20   Brunetta Jeans, PA-C  potassium chloride (KLOR-CON 10) 10 MEQ tablet Take 1 tablet (10 mEq total) by mouth daily. 06/20/20   Brunetta Jeans, PA-C    Allergies    Hydrocodone  Review of Systems   Review of Systems  Musculoskeletal: Positive for myalgias.  All other systems reviewed and are negative.   Physical Exam Updated Vital Signs BP (!) 159/103 (BP Location: Left Arm)   Pulse 77   Temp 98.2 F (36.8 C) (Oral)   Resp (!) 21   Ht 5'  7" (1.702 m)   Wt 98.9 kg   LMP 08/01/2020 (Exact Date)   SpO2 97%   BMI 34.14 kg/m   Physical Exam Vitals and nursing note reviewed.  Constitutional:      General: She is not in acute distress.    Appearance: She is well-developed.     Comments: Appears nontoxic  HENT:     Head: Normocephalic and atraumatic.  Eyes:     Conjunctiva/sclera: Conjunctivae normal.     Pupils: Pupils are equal, round, and reactive to light.  Cardiovascular:     Rate and Rhythm: Normal rate and regular rhythm.     Pulses: Normal pulses.  Pulmonary:     Effort: Pulmonary effort is normal. No respiratory distress.     Breath sounds: Normal breath sounds. No wheezing.     Comments: Clear lung sounds Abdominal:     General: There is no distension.     Palpations: Abdomen is soft. There is no mass.     Tenderness: There is no abdominal tenderness. There is no guarding or rebound.  Musculoskeletal:        General: Tenderness present. Normal range of motion.     Cervical back: Normal range of motion and neck supple.     Comments: Diffuse tenderness palpation of the entire body without focal pain.  No lesions or contusions noted.  No obvious deformities.  Radial and pedal pulses 2+ bilaterally  Skin:    General: Skin is warm and dry.     Capillary Refill: Capillary refill takes less than 2 seconds.  Neurological:     Mental Status: She is alert and oriented to person, place, and time.     ED Results / Procedures / Treatments   Labs (all labs ordered are listed, but only abnormal results are displayed) Labs Reviewed - No data to display  EKG None  Radiology No results found.  Procedures Procedures   Medications Ordered in ED Medications  ketorolac (TORADOL) 15 MG/ML injection 15 mg (has no administration in time range)  methocarbamol (ROBAXIN) tablet 750 mg (has no administration in time range)    ED Course  I have reviewed the triage vital signs and the nursing notes.  Pertinent labs  & imaging results that were available during my care of the patient were reviewed by me and considered in my medical decision making (see chart for details).    MDM Rules/Calculators/A&P  Patient presented for evaluation of generalized body aches.  On exam, patient peers nontoxic.  No infectious symptoms to indicate allergies due to COVID or other infection.  No obvious deformity.  No trauma or injury.  She has diffuse tenderness palpation everywhere, I do not believe x-ray imaging would be beneficial as I have low suspicion for fracture or dislocation.  I reviewed blood work that was obtained at urgent care yesterday, normal electrolytes, normal CK, normal kidney function.  I do not feel repeating this would be beneficial today.  Will treat symptomatically with NSAIDs and muscle relaxer.  Will encourage hydration.  Encourage follow-up with PCP.  At this time, patient appears safe for discharge.  Return precautions given.  Patient states she understands and agrees to plan.  Final Clinical Impression(s) / ED Diagnoses Final diagnoses:  Myalgia    Rx / DC Orders ED Discharge Orders         Ordered    methocarbamol (ROBAXIN) 500 MG tablet  2 times daily PRN        08/12/20 1407    naproxen (NAPROSYN) 500 MG tablet  2 times daily with meals        08/12/20 1407           Pasty Manninen, PA-C 08/12/20 1413    Valarie Merino, MD 08/14/20 1619

## 2020-08-12 NOTE — Discharge Instructions (Addendum)
It is very important that you stay well-hydrated water.  I feel dehydration is making your symptoms worse.  Your urine should be clear to pale yellow. Take naproxen 2 times a day with meals.  Do not take other anti-inflammatories at the same time (Advil, Motrin, ibuprofen, Aleve). You may supplement with Tylenol if you need further pain control. Use muscle rub twice a day as needed for pain control.  This may make you tired or groggy.  Be careful driving while taking this medicine. Follow-up with your primary care doctor for recheck of your symptoms. Return to emergency room if you develop high fevers, numbness, severe worsening pain, any new, worsening, or concerning symptoms

## 2020-08-12 NOTE — ED Triage Notes (Signed)
Per EMS- Patient states she was seen in the ED earlier today and she only received a shot and pain pill which did not help. Patient states her pain radiates down both legs.

## 2020-08-13 ENCOUNTER — Emergency Department (HOSPITAL_COMMUNITY)
Admission: EM | Admit: 2020-08-13 | Discharge: 2020-08-13 | Disposition: A | Discharge status: Home / Self Care | Payer: 59 | Attending: Emergency Medicine | Admitting: Emergency Medicine

## 2020-08-13 ENCOUNTER — Emergency Department (HOSPITAL_COMMUNITY): Payer: 59

## 2020-08-13 ENCOUNTER — Telehealth: Payer: 59 | Admitting: Physician Assistant

## 2020-08-13 ENCOUNTER — Encounter: Payer: Self-pay | Admitting: Physician Assistant

## 2020-08-13 DIAGNOSIS — R252 Cramp and spasm: Secondary | ICD-10-CM

## 2020-08-13 DIAGNOSIS — E876 Hypokalemia: Secondary | ICD-10-CM | POA: Insufficient documentation

## 2020-08-13 DIAGNOSIS — R202 Paresthesia of skin: Secondary | ICD-10-CM | POA: Diagnosis not present

## 2020-08-13 DIAGNOSIS — Z87891 Personal history of nicotine dependence: Secondary | ICD-10-CM | POA: Insufficient documentation

## 2020-08-13 DIAGNOSIS — I10 Essential (primary) hypertension: Secondary | ICD-10-CM | POA: Insufficient documentation

## 2020-08-13 DIAGNOSIS — M791 Myalgia, unspecified site: Secondary | ICD-10-CM

## 2020-08-13 DIAGNOSIS — R946 Abnormal results of thyroid function studies: Secondary | ICD-10-CM | POA: Insufficient documentation

## 2020-08-13 DIAGNOSIS — Z20822 Contact with and (suspected) exposure to covid-19: Secondary | ICD-10-CM | POA: Diagnosis not present

## 2020-08-13 DIAGNOSIS — Z79899 Other long term (current) drug therapy: Secondary | ICD-10-CM | POA: Insufficient documentation

## 2020-08-13 DIAGNOSIS — M79662 Pain in left lower leg: Secondary | ICD-10-CM | POA: Diagnosis not present

## 2020-08-13 DIAGNOSIS — Z8616 Personal history of COVID-19: Secondary | ICD-10-CM | POA: Diagnosis not present

## 2020-08-13 DIAGNOSIS — M79661 Pain in right lower leg: Secondary | ICD-10-CM | POA: Insufficient documentation

## 2020-08-13 DIAGNOSIS — R7989 Other specified abnormal findings of blood chemistry: Secondary | ICD-10-CM

## 2020-08-13 LAB — BASIC METABOLIC PANEL
Anion gap: 8 (ref 5–15)
BUN: 6 mg/dL (ref 6–20)
CO2: 26 mmol/L (ref 22–32)
Calcium: 9.1 mg/dL (ref 8.9–10.3)
Chloride: 100 mmol/L (ref 98–111)
Creatinine, Ser: 0.8 mg/dL (ref 0.44–1.00)
GFR, Estimated: >60 mL/min (ref >60)
Glucose, Bld: 109 mg/dL — ABNORMAL HIGH (ref 70–99)
Potassium: 3.3 mmol/L — ABNORMAL LOW (ref 3.5–5.1)
Sodium: 134 mmol/L — ABNORMAL LOW (ref 135–145)

## 2020-08-13 LAB — CBC
HCT: 35.8 % — ABNORMAL LOW (ref 36.0–46.0)
Hemoglobin: 10.9 g/dL — ABNORMAL LOW (ref 12.0–15.0)
MCH: 24.4 pg — ABNORMAL LOW (ref 26.0–34.0)
MCHC: 30.4 g/dL (ref 30.0–36.0)
MCV: 80.3 fL (ref 80.0–100.0)
Platelets: 540 10*3/uL — ABNORMAL HIGH (ref 150–400)
RBC: 4.46 MIL/uL (ref 3.87–5.11)
RDW: 15.1 % (ref 11.5–15.5)
WBC: 10.4 10*3/uL (ref 4.0–10.5)
nRBC: 0 % (ref 0.0–0.2)

## 2020-08-13 LAB — CK: Total CK: 96 U/L (ref 38–234)

## 2020-08-13 LAB — TSH: TSH: 5.43 u[IU]/mL — ABNORMAL HIGH (ref 0.350–4.500)

## 2020-08-13 LAB — MAGNESIUM: Magnesium: 1.9 mg/dL (ref 1.7–2.4)

## 2020-08-13 LAB — RESP PANEL BY RT-PCR (FLU A&B, COVID) ARPGX2
Influenza A by PCR: NEGATIVE
Influenza B by PCR: NEGATIVE
SARS Coronavirus 2 by RT PCR: NEGATIVE

## 2020-08-13 MED ORDER — SODIUM CHLORIDE 0.9 % IV BOLUS
1000.0000 mL | Freq: Once | INTRAVENOUS | Status: AC
  Administered 2020-08-13: 1000 mL via INTRAVENOUS

## 2020-08-13 MED ORDER — ONDANSETRON HCL 4 MG/2ML IJ SOLN
4.0000 mg | Freq: Once | INTRAMUSCULAR | Status: AC
  Administered 2020-08-13: 4 mg via INTRAVENOUS
  Filled 2020-08-13: qty 2

## 2020-08-13 MED ORDER — KETOROLAC TROMETHAMINE 30 MG/ML IJ SOLN
30.0000 mg | Freq: Once | INTRAMUSCULAR | Status: AC
  Administered 2020-08-13: 30 mg via INTRAVENOUS
  Filled 2020-08-13: qty 1

## 2020-08-13 MED ORDER — LORAZEPAM 2 MG/ML IJ SOLN
1.0000 mg | Freq: Once | INTRAMUSCULAR | Status: AC
  Administered 2020-08-13: 1 mg via INTRAVENOUS
  Filled 2020-08-13: qty 1

## 2020-08-13 MED ORDER — HYDROMORPHONE HCL 1 MG/ML IJ SOLN
1.0000 mg | Freq: Once | INTRAMUSCULAR | Status: AC
  Administered 2020-08-13: 1 mg via INTRAVENOUS
  Filled 2020-08-13: qty 1

## 2020-08-13 MED ORDER — AMLODIPINE BESYLATE 5 MG PO TABS: 5.0000 mg | ORAL_TABLET | Freq: Every day | ORAL | 0 refills | Status: DC

## 2020-08-13 MED ORDER — POTASSIUM CHLORIDE CRYS ER 20 MEQ PO TBCR
40.0000 meq | EXTENDED_RELEASE_TABLET | Freq: Once | ORAL | Status: AC
  Administered 2020-08-13: 40 meq via ORAL
  Filled 2020-08-13: qty 2

## 2020-08-13 MED ORDER — HYDROMORPHONE HCL 1 MG/ML IJ SOLN
2.0000 mg | Freq: Once | INTRAMUSCULAR | Status: AC
Start: 2020-08-13 — End: 2020-08-13
  Administered 2020-08-13: 2 mg via INTRAVENOUS
  Filled 2020-08-13: qty 2

## 2020-08-13 MED ORDER — GADOBUTROL 1 MMOL/ML IV SOLN
10.0000 mL | Freq: Once | INTRAVENOUS | Status: AC | PRN
  Administered 2020-08-13: 10 mL via INTRAVENOUS

## 2020-08-13 NOTE — ED Triage Notes (Signed)
Pt presents for bilat leg pain and cramping for awhile and it has gotten worse in the last 2 days. Recently seen at [facility] and UC with no relief from muscle relaxer's prescribed. Pt ambulatory in triage.

## 2020-08-13 NOTE — ED Notes (Addendum)
Patient still reports pain. MD Trifan notified.

## 2020-08-13 NOTE — ED Notes (Signed)
Patient transported to MRI 

## 2020-08-13 NOTE — Consult Note (Addendum)
Date: 08/13/2020               Patient Name:  Connie Herrera MRN: [id number]  DOB: 1975-04-11 Age / Sex: 45 y.o., female   PCP: Pcp, No         Requesting Physician: Dr. Langston Masker    Consulting Reason:  Generalized body aches     Chief Complaint: Generalized body aches  History of Present Illness: [name] is a 45 year old woman with medical history significant for remote history of abnormal hyperintense white matter cerebral lesions, hypertension, menorrhagia presenting for evaluation of generalized body aches.  Chronology of history: January 09, 2012: Presented with left arm weakness which she described as heavy in nature.  MRI brain performed revealed Numerous foci of abnormal white matter signal within the cerebral  hemispheres. This raises concern for demyelinating  disease/multiple sclerosis.   November 2013: It appears that she underwent a lumbar puncture which was negative for multiple sclerosis.  Per chart review, it states that patient did not follow-up with the LP results because of her fear of the results and lack of insurance.  March 23, 2013: Had an office visit with [facility] medical sensor neurology where she endorses left upper extremity heaviness, intermittent tingling.  MRI brain MS protocol was obtained, CBC/BMP was also obtained.  I do not see any follow-up after this.  MRI Brain 2015:  No interval change in the previously described nonenhancing scattered T2/FLAIR hyperintense white matter lesions, which are abnormal but nonspecific. Demyelinating disease remains a differential consideration for this appearance. However, additional etiologies would also include sequela of hypertension, inflammation, or vasculopathies.  January 2022: She presented with diplopia and blurred vision for 2days.  MRI brain showed a 9 mm well circumscribed lesion involving the lateral margin of the left lentiform nucleus, mild T2/flair hyperintensity involving the cerebral  white matter which is nonspecific but thought to be secondary to chronic microvascular ischemic disease.  Neurology was consulted at that time and instructed for patient to follow-up with outpatient neurology.  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  No other abnormalities are noted. Overall no significant change compared with previous MRI dated 04/14/2020.   Aug 11, 2020-Aug 12, 2020: Was seen at [facility] urgent care, [facility], ED and [facility] emergency department with muscle spasm, generalized body aches however her work-up has so far been unremarkable.  CBC, CMP, BMP, CK, magnesium have so far been unrevealing.  She states that she was in her usual state of health until about 3 days ago when she began experiencing excruciating pain in her bilateral lower extremities which she reports is new.  She describes the pain as cramping in nature denies any aggravating or alleviating factors.  She rates the pain at 10/10. Typically, her symptoms will begin as buttock twitching and will progress to cramps. She also describes a sensation of an object crawling on her leg.  Presently, she denies urinary or bowel incontinence, fevers, chills, nausea, vomiting, headache, dizziness, syncope, vision abnormalities.  In the ED, she received 3 mg of IV Dilaudid, 30 mg of Toradol, 1 mg of Ativan with only minimal improvement to her pain.  ED provider spoke to Dr. Delorse Lek from neurology regarding the MRI  results  Internal medicine was consulted for pain management.   Meds: *Flexeril 10 mg 3 times daily *Hydrochlorothiazide 25 mg daily *Robaxin 500 mg twice daily *Naproxen 500 mg twice daily *K chlor 10 mEq  daily   Family history: Denies any family history of similar symptoms or neurological disease Occupation: Works as a Biomedical scientist at [employer].  Social history: In a monogamous relationship.  Sexually active.  Has 4 children, 3 boys and 1 girl.  Lives at home with her son.  Denies EtOH, cigarette or illicit drug use.  Occasionally smokes marijuana Travel history: Denies any recent travel history   Review of Systems: Review of Systems  Constitutional: Negative for chills, diaphoresis, fever and malaise/fatigue.  HENT: Negative.   Eyes: Negative for blurred vision, double vision and photophobia.  Respiratory: Negative.   Cardiovascular: Negative.   Gastrointestinal: Negative.   Genitourinary: Negative.   Musculoskeletal: Positive for myalgias (Ache). Negative for back pain, falls and joint pain.  Skin: Negative for itching and rash.  Neurological: Negative for sensory change, speech change, focal weakness, seizures, loss of consciousness, weakness and headaches.       +Tingling +Numbness  +Generalized weakness  Psychiatric/Behavioral: Negative for depression and suicidal ideas. The patient does not have insomnia.   All other systems reviewed and are negative.   Physical Exam: Blood pressure (!) 160/91, pulse 89, temperature 97.7 F (36.5 C), temperature source Oral, resp. rate 15, height 5\' 7"  (1.702 m), weight 98.9 kg, last menstrual period 08/01/2020, SpO2 97 %. Physical Exam Vitals and nursing note reviewed.  Constitutional:      General: She is not in acute distress.    Appearance: Normal appearance. She is obese. She is not toxic-appearing or diaphoretic.     Comments: +Tearful  HENT:     Head: Normocephalic and atraumatic.  Cardiovascular:     Rate and Rhythm: Normal rate.     Pulses: Normal pulses.     Heart sounds: Normal heart sounds. No murmur heard.   Pulmonary:     Effort: Pulmonary effort is normal.     Breath sounds: Normal breath sounds. No rales.  Abdominal:      General: Bowel sounds are normal.     Tenderness: There is no abdominal tenderness.     Comments: Hard mass at the pelvic area due to fibroid   Musculoskeletal:        General: No swelling, tenderness or deformity. Normal range of motion.     Cervical back: Neck supple. No tenderness.     Right lower leg: No edema.     Left lower leg: No edema.  Skin:    General: Skin is warm.  Neurological:     Comments: Neurologic exam: Mental status: A&Ox3 Cranial Nerves: II: PERRL III, IV, VI: Extra-occular motions intact bilaterally V, VII: Face symmetric, sensation intact in all 3 divisions  VIII: hearing normal to rubbing fingers bilaterally  IX, X: palate rises symmetrically XI: Head turn and shoulder shrug normal bilaterally  XII: tongue midline  Motor: Strength 5/5 on all upper and lower extremities, bulk muscle and tone are normal Deep Tendon Reflexes: 2+ symmetric   Gait:Unable to access Sensory: Light touch intact and symmetric bilaterally  Coordination: There is no dysmetria on finger-to-nose. Rapid alternating movement test normal. Heel to shin test normal. Psychiatric: Normal mood and affect`     Lab results: CBC    Component Value Date/Time   WBC 10.4 08/13/2020 0348   RBC 4.46 08/13/2020 0348  HGB 10.9 (L) 08/13/2020 0348   HGB 10.6 (L) 01/09/2020 1351   HCT 35.8 (L) 08/13/2020 0348   HCT 32.8 (L) 01/09/2020 1351   PLT 540 (H) 08/13/2020 0348   PLT 436 01/09/2020 1351   MCV 80.3 08/13/2020 0348   MCV 82 01/09/2020 1351   MCH 24.4 (L) 08/13/2020 0348   MCHC 30.4 08/13/2020 0348   RDW 15.1 08/13/2020 0348   RDW 13.8 01/09/2020 1351   LYMPHSABS 2.4 06/11/2008 0020   MONOABS 0.4 06/11/2008 0020   EOSABS 0.1 06/11/2008 0020   BASOSABS 0.0 06/11/2008 0020   CMP Latest Ref Rng & Units 08/13/2020 08/11/2020 04/14/2020  Glucose 70 - 99 mg/dL 109(H) 96 101(H)  BUN 6 - 20 mg/dL 6 5(L) 9   Creatinine 0.44 - 1.00 mg/dL 0.80 0.77 0.79  Sodium 135 - 145 mmol/L 134(L) 138 139  Potassium 3.5 - 5.1 mmol/L 3.3(L) 3.9 3.5  Chloride 98 - 111 mmol/L 100 102 104  CO2 22 - 32 mmol/L 26 28 26   Calcium 8.9 - 10.3 mg/dL 9.1 9.2 8.7(L)  Total Protein 6.5 - 8.1 g/dL - 7.2 -  Total Bilirubin 0.3 - 1.2 mg/dL - 0.4 -  Alkaline Phos 38 - 126 U/L - 63 -  AST 15 - 41 U/L - 15 -  ALT 0 - 44 U/L - 16 -    Imaging results:  MRI cervical and thoracic spine IMPRESSION: 1. No evidence of spinal cord demyelinating disease. 2. Mild spinal stenosis at T7-T8 related to small disc herniation and superimposed thoracic epidural lipomatosis. Up to mild spinal cord mass effect, but no cord signal abnormality. 3. No other significant thoracic spine degeneration. Mild cervical disc bulging with no associated neural impingement. 4. 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 5. Bulky fibroid uterus.  Assessment, Plan, & Recommendations by Problem: #Nonspecific generalized body aches Mrs. Delong is a 45 year old woman with an interesting longstanding history of several neurological symptoms including upper extremity weakness, twitching, spasm, lower extremity weakness which all seems intermittent.  She is presenting to the ED today with a 3-day history of generalized body aches and spasms.  Per her history, her initial contact with a medical provider for her symptoms dates back to 2013 when she was seen in the ED for left arm weakness.  An MRI was performed which showed numerous foci of abnormal white matter signal within the cerebral hemisphere that was concerning for demyelinating disease.  She had an LP performed which was negative for multiple sclerosis.  She followed up with outpatient neurology at [facility] for which an MRI MS protocol was performed which showed no interval change from the previously enhancing T2/FLAIR hyperintense white  matter lesions.  Recently in January 2022 she came to the ED with diplopia and vision blurriness.  MRI showed a 9 mm well circumscribed lesion of the left margin of the left lentiform nucleus and mild T2/FLAIR hyperintensity involving the cerebral white matter that was nonspecific but thought to be due to chronic microvascular ischemic changes.  She was advised to follow-up with outpatient neurology where she did follow-up.  During this admission, her work-up including CBC, CMP, BMP, CK, magnesium have so far been unrevealing.  A MRI cervical and thoracic spine shows no evidence of spinal cord demyelinating disease.  As it stands, her lower extremity pain has been minimally relieved with Dilaudid, Toradol and muscle relaxants.  I currently do not see any indication to admit  her inpatient.  I will place an order for oral potassium and order a TSH to complete her work-up.  She will have to follow-up with neurology outpatient and her PCP.  I will advise her to continue taking her muscle relaxants. Might be worth obtaining an EMG outpatient  Patient already has follow up with her PCP and states that she will follow up with Neurology as well.   #Elevated TSH Noted to have a mildly elevated TSH of 5.4.  -Will need to obtain Free T3, T4 outpatient vs repeating TSH in 6 weeks.    #Hypertension -Switch home hydrochlorothiazide to amlodipine due to hypokalemia   #Hypokalemia Potassium of 3.3. -Oral potassium repletion   #Uterine fibroids  -States she has follow up with OBGYN   Signed: Jean Rosenthal, MD 08/13/2020, 8:02 AM

## 2020-08-13 NOTE — ED Provider Notes (Signed)
[facility] EMERGENCY DEPARTMENT Provider Note   CSN: [id number] Arrival date & time: 08/13/20  0325     History Chief Complaint  Patient presents with  . Generalized Body Aches    Connie Herrera is a 45 y.o. female presenting to the emergency department with intractable pain in her lower extremities.  Patient reports that her symptoms began 3 days ago.  She has never had this pain before.  She describes cramping pain and exquisite tenderness to lower extremities.  It is equal bilaterally.  She describes paresthesias in her feet.  She has been seen in the emergency department twice prior to this, yesterday and the day before, but I prescribed muscle relaxer as well given Toradol.  She reports no improvement in her symptoms.  Her pain is 10 out of 10.  She is sobbing on my exam.  She denies any statin use.  She takes HCTZ and potassium at home, but denies any other medications.  She denies any known personal or family history of autoimmune disease.  Medical record review shows the patient was seen by a neurologist at [facility] 2014.  She was noted that time to be having arm heaviness as well as "twitching of all of her muscles".  Per neurologist note, there is concern the patient may have had demyelinating lesions on prior MR imaging, although this is not clear whether she had actually true diagnosis of MS.    HPI     Past Medical History:  Diagnosis Date  . Brain lesion   . COVID-19 04/2020  . Diplopia   . Hypertension     Patient Active Problem List   Diagnosis Date Noted  . Anemia due to blood loss, chronic 01/10/2020  . Menorrhagia with regular cycle 01/09/2020  . BMI 33.0-33.9,adult 01/09/2020  . Screening breast examination 02/07/2019  . Breast pain 02/07/2019  . Hypertension 01/26/2019  . Paresthesias 08/04/2013  . Breast pain, right 03/11/2012    Past Surgical History:  Procedure Laterality Date  . TUBAL LIGATION       OB  History    Gravida  4   Para  4   Term  4   Preterm  0   AB  0   Living  4     SAB  0   IAB  0   Ectopic  0   Multiple  0   Live Births  4           Family History  Problem Relation Age of Onset  . Healthy Mother   . Healthy Father   . Cancer Maternal Grandmother 101       breast  . Breast cancer Maternal Grandmother     Social History   Tobacco Use  . Smoking status: Former Smoker    Packs/day: 0.50    Years: 4.00    Pack years: 2.00    Types: Cigarettes    Quit date: 06/04/2017    Years since quitting: 3.1  . Smokeless tobacco: Never Used  Vaping Use  . Vaping Use: Never used  Substance Use Topics  . Alcohol use: No  . Drug use: No    Home Medications Prior to Admission medications   Medication Sig Start Date End Date Taking? Authorizing Provider  amLODipine (NORVASC) 5 MG tablet Take 1 tablet (5 mg total) by mouth daily. 08/13/20  Yes Lajean Saver, MD  acyclovir (ZOVIRAX) 200 MG capsule Take 2 capsules (400 mg total) by mouth  3 (three) times daily. Take for 5 days prn each outbreak. 03/18/20   Shelly Bombard, MD  cyclobenzaprine (FLEXERIL) 10 MG tablet Take 1 tablet (10 mg total) by mouth 3 (three) times daily as needed for muscle spasms. 08/11/20   Raspet, Derry Skill, PA-C  methocarbamol (ROBAXIN) 500 MG tablet Take 1 tablet (500 mg total) by mouth 2 (two) times daily as needed for muscle spasms. 08/12/20   Caccavale, Sophia, PA-C  naproxen (NAPROSYN) 500 MG tablet Take 1 tablet (500 mg total) by mouth 2 (two) times daily with a meal. 08/12/20   Caccavale, Sophia, PA-C  potassium chloride (KLOR-CON 10) 10 MEQ tablet Take 1 tablet (10 mEq total) by mouth daily. 06/20/20   Brunetta Jeans, PA-C  hydrochlorothiazide (HYDRODIURIL) 25 MG tablet Take 1 tablet (25 mg total) by mouth daily. Must have office visit for refills 06/20/20 08/13/20  Brunetta Jeans, PA-C    Allergies    Hydrocodone  Review of Systems   Review of Systems  Constitutional: Negative  for chills and fever.  Eyes: Negative for photophobia and visual disturbance.  Respiratory: Negative for cough and shortness of breath.   Cardiovascular: Negative for chest pain and palpitations.  Gastrointestinal: Negative for abdominal pain and vomiting.  Genitourinary: Negative for dysuria and hematuria.  Musculoskeletal: Positive for arthralgias and myalgias.  Skin: Negative for color change and rash.  Neurological: Positive for numbness. Negative for syncope.  All other systems reviewed and are negative.   Physical Exam Updated Vital Signs BP (!) 148/93 (BP Location: Left Arm)   Pulse 86   Temp 98 F (36.7 C) (Oral)   Resp 16   Ht 5\' 7"  (1.702 m)   Wt 98.9 kg   LMP 08/01/2020 (Exact Date)   SpO2 100%   BMI 34.14 kg/m   Physical Exam Constitutional:      General: She is in acute distress.     Comments: Sobbing  HENT:     Head: Normocephalic and atraumatic.  Eyes:     Conjunctiva/sclera: Conjunctivae normal.     Pupils: Pupils are equal, round, and reactive to light.  Cardiovascular:     Rate and Rhythm: Normal rate and regular rhythm.  Pulmonary:     Effort: Pulmonary effort is normal. No respiratory distress.  Abdominal:     General: There is no distension.     Tenderness: There is no abdominal tenderness.  Musculoskeletal:     Comments: Diffuse tenderness of entire lower extremity, all muscle groups 5/5 strength in lower extremities  Skin:    General: Skin is warm and dry.  Neurological:     General: No focal deficit present.     Mental Status: She is alert and oriented to person, place, and time. Mental status is at baseline.  Psychiatric:        Mood and Affect: Mood normal.        Behavior: Behavior normal.     ED Results / Procedures / Treatments   Labs (all labs ordered are listed, but only abnormal results are displayed) Labs Reviewed  BASIC METABOLIC PANEL - Abnormal; Notable for the following components:      Result Value   Sodium 134 (*)     Potassium 3.3 (*)    Glucose, Bld 109 (*)    All other components within normal limits  CBC - Abnormal; Notable for the following components:   Hemoglobin 10.9 (*)    HCT 35.8 (*)    MCH 24.4 (*)  Platelets 540 (*)    All other components within normal limits  TSH - Abnormal; Notable for the following components:   TSH 5.430 (*)    All other components within normal limits  RESP PANEL BY RT-PCR (FLU A&B, COVID) ARPGX2  MAGNESIUM  CK    EKG None  Radiology MR Cervical Spine W or Wo Contrast  Result Date: 08/13/2020 CLINICAL DATA:  45 year old female with multiple sclerosis. Bilateral leg pain and numbness for 3 days. EXAM: MRI CERVICAL AND THORACIC SPINE WITHOUT AND WITH CONTRAST TECHNIQUE: Multiplanar and multiecho pulse sequences of the cervical spine, to include the craniocervical junction and cervicothoracic junction, and the thoracic spine, were obtained without and with intravenous contrast. CONTRAST:  27mL GADAVIST GADOBUTROL 1 MMOL/ML IV SOLN COMPARISON:  Brain MRI 07/11/2020 and earlier. Pelvis ultrasound 01/22/2020. FINDINGS: MRI CERVICAL SPINE FINDINGS Alignment: Straightening and mild reversal of cervical lordosis. Vertebrae: No marrow edema or evidence of acute osseous abnormality. Visualized bone marrow signal is within normal limits. Cord: The cervical spinal cord appears normal. No convincing cord signal abnormality, and fairly capacious cervical spinal canal. No abnormal intradural enhancement. No dural thickening. Posterior Fossa, vertebral arteries, paraspinal tissues: Cervicomedullary junction is within normal limits. Negative visible posterior fossa. Preserved major vascular flow voids in the neck. Negative visible neck soft tissues and lung apices. Disc levels: Mild disc bulging and endplate spurring at B2-W4 through C6-C7. No associated stenosis. Other findings: Partially visible small 8 mm area of ring enhancement in the left lentiform appears stable on series 35,  image 15. MRI THORACIC SPINE FINDINGS Segmentation: Appears to be normal. Alignment: Normal to mildly exaggerated thoracic kyphosis. No spondylolisthesis. Vertebrae: No marrow edema or evidence of acute osseous abnormality. Visualized bone marrow signal is within normal limits. Cord: No thoracic cord signal abnormality, despite spinal stenosis with some cord mass effect at T7-T8 detailed below. Conus medullaris appears normal at L1. No abnormal intradural enhancement. No dural thickening. Paraspinal and other soft tissues: Negative visible chest and upper abdominal viscera. Scout view re-demonstrates bulky and lobulated fibroid uterus (12 cm series 23, image 9). Disc levels: Normal intervertebral disc signal and morphology from T1-T2 through T6-T7. Thoracic epidural lipomatosis is moderate from T4 through T7 and effaces some CSF from the thecal sac at those levels. Superimposed T7-T8 mild disc desiccation and small broad-based central disc protrusion (series 26, image 12 and series 29, image 23). Mild spinal stenosis with up to mild cord mass effect. No foraminal involvement or stenosis. T8-T9 through L1-L2 intervertebral disc signal and morphology is normal. IMPRESSION: 1. No evidence of spinal cord demyelinating disease. 2. Mild spinal stenosis at T7-T8 related to small disc herniation and superimposed thoracic epidural lipomatosis. Up to mild spinal cord mass effect, but no cord signal abnormality. 3. No other significant thoracic spine degeneration. Mild cervical disc bulging with no associated neural impingement. 4. 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 5. Bulky fibroid uterus. Electronically Signed   By: Genevie Ann M.D.   On: 08/13/2020 07:05   MR THORACIC SPINE W WO CONTRAST  Result Date: 08/13/2020 CLINICAL DATA:  45 year old female with multiple sclerosis. Bilateral leg pain and numbness for 3 days. EXAM: MRI CERVICAL AND THORACIC SPINE WITHOUT AND WITH CONTRAST  TECHNIQUE: Multiplanar and multiecho pulse sequences of the cervical spine, to include the craniocervical junction and cervicothoracic junction, and the thoracic spine, were obtained without and with intravenous contrast. CONTRAST:  60mL GADAVIST GADOBUTROL 1 MMOL/ML IV SOLN COMPARISON:  Brain MRI 07/11/2020 and earlier. Pelvis ultrasound 01/22/2020. FINDINGS: MRI CERVICAL SPINE FINDINGS Alignment: Straightening and mild reversal of cervical lordosis. Vertebrae: No marrow edema or evidence of acute osseous abnormality. Visualized bone marrow signal is within normal limits. Cord: The cervical spinal cord appears normal. No convincing cord signal abnormality, and fairly capacious cervical spinal canal. No abnormal intradural enhancement. No dural thickening. Posterior Fossa, vertebral arteries, paraspinal tissues: Cervicomedullary junction is within normal limits. Negative visible posterior fossa. Preserved major vascular flow voids in the neck. Negative visible neck soft tissues and lung apices. Disc levels: Mild disc bulging and endplate spurring at L9-F7 through C6-C7. No associated stenosis. Other findings: Partially visible small 8 mm area of ring enhancement in the left lentiform appears stable on series 35, image 15. MRI THORACIC SPINE FINDINGS Segmentation: Appears to be normal. Alignment: Normal to mildly exaggerated thoracic kyphosis. No spondylolisthesis. Vertebrae: No marrow edema or evidence of acute osseous abnormality. Visualized bone marrow signal is within normal limits. Cord: No thoracic cord signal abnormality, despite spinal stenosis with some cord mass effect at T7-T8 detailed below. Conus medullaris appears normal at L1. No abnormal intradural enhancement. No dural thickening. Paraspinal and other soft tissues: Negative visible chest and upper abdominal viscera. Scout view re-demonstrates bulky and lobulated fibroid uterus (12 cm series 23, image 9). Disc levels: Normal intervertebral disc signal  and morphology from T1-T2 through T6-T7. Thoracic epidural lipomatosis is moderate from T4 through T7 and effaces some CSF from the thecal sac at those levels. Superimposed T7-T8 mild disc desiccation and small broad-based central disc protrusion (series 26, image 12 and series 29, image 23). Mild spinal stenosis with up to mild cord mass effect. No foraminal involvement or stenosis. T8-T9 through L1-L2 intervertebral disc signal and morphology is normal. IMPRESSION: 1. No evidence of spinal cord demyelinating disease. 2. Mild spinal stenosis at T7-T8 related to small disc herniation and superimposed thoracic epidural lipomatosis. Up to mild spinal cord mass effect, but no cord signal abnormality. 3. No other significant thoracic spine degeneration. Mild cervical disc bulging with no associated neural impingement. 4. 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 5. Bulky fibroid uterus. Electronically Signed   By: Genevie Ann M.D.   On: 08/13/2020 07:05    Procedures Procedures   Medications Ordered in ED Medications  HYDROmorphone (DILAUDID) injection 1 mg (1 mg Intravenous Given 08/13/20 0404)  sodium chloride 0.9 % bolus 1,000 mL (0 mLs Intravenous Stopped 08/13/20 0915)  ketorolac (TORADOL) 30 MG/ML injection 30 mg (30 mg Intravenous Given 08/13/20 0405)  ondansetron (ZOFRAN) injection 4 mg (4 mg Intravenous Given 08/13/20 0437)  LORazepam (ATIVAN) injection 1 mg (1 mg Intravenous Given 08/13/20 0437)  HYDROmorphone (DILAUDID) injection 2 mg (2 mg Intravenous Given 08/13/20 0457)  gadobutrol (GADAVIST) 1 MMOL/ML injection 10 mL (10 mLs Intravenous Contrast Given 08/13/20 0636)  potassium chloride SA (KLOR-CON) CR tablet 40 mEq (40 mEq Oral Given 08/13/20 1028)    ED Course  I have reviewed the triage vital signs and the nursing notes.  Pertinent labs & imaging results that were available during my care of the patient were reviewed by me and considered in my medical  decision making (see chart for details).  45 year old female presenting with intractable pain in lower extremities. DDx includes myositis vs electrolyte deficiency (K, Ca, Mag) vs MS/CNS lesion vs other  Medical records reviewed including neurology note from 2014 and prior MRI imaging.  There seems to be a  question of demyelinating lesions on the scans, and per the neurologist note.  She does not carry a specific diagnosis of MS that I am aware of.  However, this remains in the differential.  I discussed the case with our neurologist who recommended CT scan of the T and C-spine with and without contrast.  She has brisk pedal pulses and warm lower extremities, which lowers my suspicion for acute vascular occlusion or acute claudication.  This is also unlikely to be bilateral.  We'll check labs again, give IV steroids, IV pain medications   Clinical Course as of 08/13/20 1535  Tue Aug 13, 2020  0424 I spoke to Dr Leonel Ramsay from neurology regarding the patient's symptoms, there is questionable signal intensity on prior MRI scan, and the possibility of MS.  He advised that we proceed with a CT scan of the cervical and thoracic spine with and without contrast.  I discussed this with the patient and feels that she needs additional anxiolytic or muscle relaxer, she is unable to lay still.  This is after the Dilaudid.  We will give her 1 mg of Ativan. [MT]  [phone] Patient continues to complain of intractable pain while being prepared to be taken to MRI.  She does appear to have high tolerance for narcotics.  She appears unfazed by her prior medications.  I have asked nursing to give 2 mg of IV Dilaudid. [MT]  0603 No significant finding the patient's labs do suggest a reason for her muscle cramping.  Potassium is only mildly low at 3.3.  Magnesium is normal.  CK is normal.  COVID and flu are negative. [MT]  0710 No acute lesion on MRI.  Patient remains with intractable pain.  Will admit for pain control  [MT]  0716 PCP Jilda Panda unassigned. [MT]  0725 I talked to IM service regarding admission for pain control.  They will evaluate the patient in the ED.  Patient signed out to Dr Ashok Cordia EDP pending hospital team evaluation. [MT]    Clinical Course User Index [MT] Wyvonnia Dusky, MD    Final Clinical Impression(s) / ED Diagnoses Final diagnoses:  Pain in both lower legs  Essential hypertension  Elevated TSH  Hypokalemia    Rx / DC Orders ED Discharge Orders         Ordered    amLODipine (NORVASC) 5 MG tablet  Daily        08/13/20 0946           Wyvonnia Dusky, MD 08/13/20 1535

## 2020-08-13 NOTE — Discharge Instructions (Addendum)
It was our pleasure to provide your ER care today - we hope that you feel better.  Make the changes discussed in your medications/blood pressure medication as discussed with the medicine doctors (taking amlodipine, and stopping the HCTZ).   Take amlodipine as prescribed, limit salt intake, and follow up with primary care doctor for recheck of blood pressure in the coming week.  Your potassium level is mildly low - eat plenty of fruits and vegetables, and follow up with primary care doctor in 1 week.   They also ordered a thyroid test (TSH) that is mildly high at 5.4 - they request that you follow up with primary care doctor in the coming week, discuss that result, and possible additional testing, and then decide on possibly starting thyroid medication then.   Also follow up with neurologist in the next 1-2 weeks.   Return to ER if worse, new symptoms, fevers, trouble breathing, numbness/weakness, new/severe pain, trouble breathing, or other concern.   You were given pain medication in the ER - no driving for the next 8 hours.

## 2020-08-13 NOTE — Progress Notes (Signed)
[name], Connie Herrera are scheduled for a virtual visit with your provider today.    Just as we do with appointments in the office, we must obtain your consent to participate.  Your consent will be active for this visit and any virtual visit you may have with one of our providers in the next 365 days.    If you have a MyChart account, I can also send a copy of this consent to you electronically.  All virtual visits are billed to your insurance company just like a traditional visit in the office.  As this is a virtual visit, video technology does not allow for your provider to perform a traditional examination.  This may limit your provider's ability to fully assess your condition.  If your provider identifies any concerns that need to be evaluated in person or the need to arrange testing such as labs, EKG, etc, we will make arrangements to do so.    Although advances in technology are sophisticated, we cannot ensure that it will always work on either your end or our end.  If the connection with a video visit is poor, we may have to switch to a telephone visit.  With either a video or telephone visit, we are not always able to ensure that we have a secure connection.   I need to obtain your verbal consent now.   Are you willing to proceed with your visit today?   Connie Herrera has provided verbal consent on 08/13/2020 for a virtual visit (video or telephone).  Leeanne Rio, PA-C 08/13/2020  2:58 PM  Virtual Visit via Video   I connected with patient on 08/13/20 at  3:00 PM EDT by a video enabled telemedicine application and verified that I am speaking with the correct person using two identifiers.  Location patient: Home Location provider: [place] participating in the virtual visit: Patient, Provider  I discussed the limitations of evaluation and management by telemedicine and the availability of in person appointments. The patient expressed understanding and  agreed to proceed.  Subjective:   HPI:   Patient presents via [place] today in severe pain. Notes over the past several days she has been having almost non-stop 10/10 cramps in bilateral lower extremities associated with numbness of legs bilaterally. Denies trauma or injury. Has been seen at UC x 1 and ER x 2 in the past 2 days, most recently earlier this morning. She was given something for her blood pressure, mild hypokalemia and a muscle relaxer to help with current symptoms. She is taking as directed with no relief. States she has been in tears most of the day. States they were going to admit her earlier in the ER but then came and discharged her instead. She is unsure of what to do but states she cannot live like this. .   ROS:   See pertinent positives and negatives per HPI.  Patient Active Problem List   Diagnosis Date Noted  . Anemia due to blood loss, chronic 01/10/2020  . Menorrhagia with regular cycle 01/09/2020  . BMI 33.0-33.9,adult 01/09/2020  . Screening breast examination 02/07/2019  . Breast pain 02/07/2019  . Hypertension 01/26/2019  . Paresthesias 08/04/2013  . Breast pain, right 03/11/2012    Social History   Tobacco Use  . Smoking status: Former Smoker    Packs/day: 0.50    Years: 4.00    Pack years: 2.00    Types: Cigarettes    Quit date: 06/04/2017  Years since quitting: 3.1  . Smokeless tobacco: Never Used  Substance Use Topics  . Alcohol use: No    Current Outpatient Medications:  .  acyclovir (ZOVIRAX) 200 MG capsule, Take 2 capsules (400 mg total) by mouth 3 (three) times daily. Take for 5 days prn each outbreak., Disp: 30 capsule, Rfl: 11 .  amLODipine (NORVASC) 5 MG tablet, Take 1 tablet (5 mg total) by mouth daily., Disp: 30 tablet, Rfl: 0 .  cyclobenzaprine (FLEXERIL) 10 MG tablet, Take 1 tablet (10 mg total) by mouth 3 (three) times daily as needed for muscle spasms., Disp: 30 tablet, Rfl: 0 .  methocarbamol (ROBAXIN) 500 MG tablet, Take 1  tablet (500 mg total) by mouth 2 (two) times daily as needed for muscle spasms., Disp: 14 tablet, Rfl: 0 .  naproxen (NAPROSYN) 500 MG tablet, Take 1 tablet (500 mg total) by mouth 2 (two) times daily with a meal., Disp: 20 tablet, Rfl: 0 .  potassium chloride (KLOR-CON 10) 10 MEQ tablet, Take 1 tablet (10 mEq total) by mouth daily., Disp: 30 tablet, Rfl: 0  Allergies  Allergen Reactions  . Hydrocodone Nausea And Vomiting    Patient states that she can take medication it just makes her have bad nausea    Objective:   LMP 08/01/2020 (Exact Date)   Patient is well-developed, well-nourished in significant painful distress, tearful.  Resting on bed on her side.  Head is normocephalic, atraumatic.  No labored breathing.  Speech is clear and coherent with logical content.  Patient is alert and oriented at baseline.   Assessment and Plan:   1. Myalgia 2. Muscle cramping 3. Paresthesias Workup in the ER thus far has only shown mild hypokalemia and slightly elevated TSH. She had MRI spine but only C and T spine were done. Giving symptomology and severity of symptoms she has been instructed to have her son carry her directly back to the ER or call 911. She needs imaging of lower extremity and medication for pain control. Likely needs admission due to symptoms and number of ER visits in the past 2 days -- to make sure things are thoroughly evaluated. Did let her know this would be at the discretion of the evaluating ER provider.  Patient voiced understanding and agreement.    Leeanne Rio, PA-C 08/13/2020

## 2020-08-13 NOTE — ED Provider Notes (Addendum)
Medicine team indicates they have evaluated patient with their attending, and their plan is d/c of patient with pcp follow up (and that they need help completing ED d/c paperwork) - will place d/c instructions/orders per their request.  They request rx amlodipine 5 mg. As relates the tsh they ordered - it is mildly high - they recommend pcp f/u for check of additional labs and decision on meds then.   Pt currently alert, comfortable appearing, no acute distress/discomfort noted - pt currently appears stable for d/c per medicine plan.        Lajean Saver, MD 08/13/20 [phone]

## 2020-08-14 ENCOUNTER — Encounter (HOSPITAL_COMMUNITY): Payer: Self-pay | Admitting: Emergency Medicine

## 2020-08-14 ENCOUNTER — Observation Stay (HOSPITAL_COMMUNITY)
Admission: EM | Admit: 2020-08-14 | Discharge: 2020-08-14 | Disposition: A | Discharge status: Home / Self Care | Payer: 59 | Attending: Internal Medicine | Admitting: Internal Medicine

## 2020-08-14 ENCOUNTER — Other Ambulatory Visit: Payer: Self-pay

## 2020-08-14 DIAGNOSIS — M62838 Other muscle spasm: Secondary | ICD-10-CM | POA: Diagnosis present

## 2020-08-14 DIAGNOSIS — I10 Essential (primary) hypertension: Secondary | ICD-10-CM | POA: Diagnosis not present

## 2020-08-14 DIAGNOSIS — Z87891 Personal history of nicotine dependence: Secondary | ICD-10-CM | POA: Insufficient documentation

## 2020-08-14 DIAGNOSIS — Z79899 Other long term (current) drug therapy: Secondary | ICD-10-CM | POA: Insufficient documentation

## 2020-08-14 DIAGNOSIS — D259 Leiomyoma of uterus, unspecified: Secondary | ICD-10-CM | POA: Diagnosis present

## 2020-08-14 DIAGNOSIS — E6609 Other obesity due to excess calories: Secondary | ICD-10-CM

## 2020-08-14 DIAGNOSIS — Z8616 Personal history of COVID-19: Secondary | ICD-10-CM | POA: Diagnosis not present

## 2020-08-14 LAB — CBC
HCT: 33.8 % — ABNORMAL LOW (ref 36.0–46.0)
Hemoglobin: 10.2 g/dL — ABNORMAL LOW (ref 12.0–15.0)
MCH: 24.6 pg — ABNORMAL LOW (ref 26.0–34.0)
MCHC: 30.2 g/dL (ref 30.0–36.0)
MCV: 81.6 fL (ref 80.0–100.0)
Platelets: 469 10*3/uL — ABNORMAL HIGH (ref 150–400)
RBC: 4.14 MIL/uL (ref 3.87–5.11)
RDW: 15 % (ref 11.5–15.5)
WBC: 6.4 10*3/uL (ref 4.0–10.5)
nRBC: 0 % (ref 0.0–0.2)

## 2020-08-14 LAB — T4, FREE: Free T4: 1.56 ng/dL — ABNORMAL HIGH (ref 0.61–1.12)

## 2020-08-14 LAB — BASIC METABOLIC PANEL
Anion gap: 6 (ref 5–15)
BUN: 8 mg/dL (ref 6–20)
CO2: 25 mmol/L (ref 22–32)
Calcium: 8.8 mg/dL — ABNORMAL LOW (ref 8.9–10.3)
Chloride: 103 mmol/L (ref 98–111)
Creatinine, Ser: 0.86 mg/dL (ref 0.44–1.00)
GFR, Estimated: >60 mL/min (ref >60)
Glucose, Bld: 108 mg/dL — ABNORMAL HIGH (ref 70–99)
Potassium: 3.2 mmol/L — ABNORMAL LOW (ref 3.5–5.1)
Sodium: 134 mmol/L — ABNORMAL LOW (ref 135–145)

## 2020-08-14 MED ORDER — POTASSIUM CHLORIDE CRYS ER 20 MEQ PO TBCR
40.0000 meq | EXTENDED_RELEASE_TABLET | Freq: Once | ORAL | Status: AC
  Administered 2020-08-14: 40 meq via ORAL
  Filled 2020-08-14: qty 2

## 2020-08-14 MED ORDER — ACETAMINOPHEN 325 MG PO TABS: 650.0000 mg | ORAL_TABLET | Freq: Four times a day (QID) | ORAL | Status: DC | PRN

## 2020-08-14 MED ORDER — CYCLOBENZAPRINE HCL 10 MG PO TABS: 10.0000 mg | ORAL_TABLET | Freq: Three times a day (TID) | ORAL | Status: DC | PRN

## 2020-08-14 MED ORDER — HYDROMORPHONE HCL 1 MG/ML IJ SOLN: 1.0000 mg | INTRAMUSCULAR | Status: DC | PRN

## 2020-08-14 MED ORDER — ONDANSETRON HCL 4 MG/2ML IJ SOLN: 4.0000 mg | Freq: Four times a day (QID) | INTRAMUSCULAR | Status: DC | PRN

## 2020-08-14 MED ORDER — AMLODIPINE BESYLATE 5 MG PO TABS: 5.0000 mg | ORAL_TABLET | Freq: Every day | ORAL | Status: DC

## 2020-08-14 MED ORDER — LACTATED RINGERS IV SOLN: INTRAVENOUS | Status: DC

## 2020-08-14 MED ORDER — POTASSIUM CHLORIDE CRYS ER 20 MEQ PO TBCR: 40.0000 meq | EXTENDED_RELEASE_TABLET | Freq: Two times a day (BID) | ORAL | 0 refills | Status: DC

## 2020-08-14 MED ORDER — HYDROMORPHONE HCL 1 MG/ML IJ SOLN
1.0000 mg | Freq: Once | INTRAMUSCULAR | Status: AC
  Administered 2020-08-14: 1 mg via INTRAVENOUS
  Filled 2020-08-14: qty 1

## 2020-08-14 NOTE — ED Triage Notes (Signed)
Pt c/o spasms throughout her whole body. Seen for same recently, states muscle relaxers not working.

## 2020-08-14 NOTE — ED Provider Notes (Signed)
Patient seen by the hospitalist this morning and now electing to be discharged home.  She agrees to stop her HCTZ and start Norvasc.  PCP follow-up.   Deno Etienne, DO 08/14/20 [phone]

## 2020-08-14 NOTE — ED Provider Notes (Signed)
[facility] EMERGENCY DEPARTMENT Provider Note   CSN: [id number] Arrival date & time: 08/14/20  0938     History Chief Complaint  Patient presents with  . Spasms    Connie Herrera is a 45 y.o. female presenting back to the emergency department with continued muscle pain.  This is the patient's fourth visit to the ER in 4 days for intractable pain.  She was seen by myself in the emergency department yesterday for pain involving the bilateral lower extremities, which she described as severe muscle cramping.  She states now that the pain appears to be traveling up her back.  In the ER yesterday, the patient was given multiple rounds of IV Dilaudid.  She had MRI scan of her C and T-spine performed to rule out MS lesion, given she does have a history of nonspecific abnormality noted on old brain MRI.  She has not been specifically diagnosed with MS in the past.  The patient's lab work in the emergency department yesterday was fairly unremarkable, except for mild hypokalemia with with a potassium of 3.3, as well as elevated TSH.  Internal medicine team was consulted for admission, and evaluated the patient and felt she did not need to be admitted to the hospital.  The patient was subsequently discharged.  She did attempt to follow-up with her family practitioner, per care everywhere records, and her practitioner was concerned about her severe intractable pain and recommended that they return to the ER.  Today the patient reports to me that she has had "episodes" of similar pain in the past, but they have never been this severe in her life.  She has never been able to get an explanation as to what is causing it.  Her pain is currently 10 out of 10.  It is mostly located in her lower and mid back.  She continues to have some cramping in her legs but this is overall improved from yesterday.  HPI     Past Medical History:  Diagnosis Date  . Brain lesion   . COVID-19 04/2020   . Diplopia   . Hypertension     Patient Active Problem List   Diagnosis Date Noted  . Muscle spasms of both lower extremities 08/14/2020  . Fibroid uterus 08/14/2020  . Anemia due to blood loss, chronic 01/10/2020  . Menorrhagia with regular cycle 01/09/2020  . Class 1 obesity due to excess calories with body mass index (BMI) of 34.0 to 34.9 in adult 01/09/2020  . Screening breast examination 02/07/2019  . Breast pain 02/07/2019  . Hypertension 01/26/2019  . Paresthesias 08/04/2013  . Breast pain, right 03/11/2012    Past Surgical History:  Procedure Laterality Date  . TUBAL LIGATION       OB History    Gravida  4   Para  4   Term  4   Preterm  0   AB  0   Living  4     SAB  0   IAB  0   Ectopic  0   Multiple  0   Live Births  4           Family History  Problem Relation Age of Onset  . Healthy Mother   . Healthy Father   . Cancer Maternal Grandmother 55       breast  . Breast cancer Maternal Grandmother     Social History   Tobacco Use  . Smoking status: Former Smoker  Packs/day: 0.50    Years: 4.00    Pack years: 2.00    Types: Cigarettes    Quit date: 06/04/2017    Years since quitting: 3.1  . Smokeless tobacco: Never Used  Vaping Use  . Vaping Use: Never used  Substance Use Topics  . Alcohol use: No  . Drug use: Yes    Types: Marijuana    Comment: maybe once a week    Home Medications Prior to Admission medications   Medication Sig Start Date End Date Taking? Authorizing Provider  potassium chloride SA (KLOR-CON) 20 MEQ tablet Take 2 tablets (40 mEq total) by mouth 2 (two) times daily for 5 days. 08/14/20 08/19/20 Yes Deno Etienne, DO  acyclovir (ZOVIRAX) 200 MG capsule Take 2 capsules (400 mg total) by mouth 3 (three) times daily. Take for 5 days prn each outbreak. 03/18/20   Shelly Bombard, MD  amLODipine (NORVASC) 5 MG tablet Take 1 tablet (5 mg total) by mouth daily. 08/13/20   Lajean Saver, MD  cyclobenzaprine (FLEXERIL)  10 MG tablet Take 1 tablet (10 mg total) by mouth 3 (three) times daily as needed for muscle spasms. 08/11/20   Raspet, Derry Skill, PA-C  methocarbamol (ROBAXIN) 500 MG tablet Take 1 tablet (500 mg total) by mouth 2 (two) times daily as needed for muscle spasms. 08/12/20   Caccavale, Sophia, PA-C  naproxen (NAPROSYN) 500 MG tablet Take 1 tablet (500 mg total) by mouth 2 (two) times daily with a meal. 08/12/20   Caccavale, Sophia, PA-C  potassium chloride (KLOR-CON 10) 10 MEQ tablet Take 1 tablet (10 mEq total) by mouth daily. 06/20/20   Brunetta Jeans, PA-C  hydrochlorothiazide (HYDRODIURIL) 25 MG tablet Take 1 tablet (25 mg total) by mouth daily. Must have office visit for refills 06/20/20 08/13/20  Brunetta Jeans, PA-C    Allergies    Hydrocodone  Review of Systems   Review of Systems  Constitutional: Negative for chills and fever.  Eyes: Negative for pain and visual disturbance.  Respiratory: Negative for cough and shortness of breath.   Cardiovascular: Negative for chest pain and palpitations.  Gastrointestinal: Negative for abdominal pain, nausea and vomiting.  Genitourinary: Negative for dysuria and hematuria.  Musculoskeletal: Positive for arthralgias and myalgias.  Skin: Negative for color change and rash.  Neurological: Negative for syncope and headaches.  All other systems reviewed and are negative.   Physical Exam Updated Vital Signs BP 137/89   Pulse 95   Temp (!) 97.5 F (36.4 C) (Oral)   Resp (!) 21   LMP 08/01/2020 (Exact Date)   SpO2 100%   Physical Exam Constitutional:      General: She is not in acute distress. HENT:     Head: Normocephalic and atraumatic.  Eyes:     Conjunctiva/sclera: Conjunctivae normal.     Pupils: Pupils are equal, round, and reactive to light.  Cardiovascular:     Rate and Rhythm: Normal rate and regular rhythm.  Pulmonary:     Effort: Pulmonary effort is normal. No respiratory distress.  Abdominal:     General: There is no distension.      Tenderness: There is no abdominal tenderness.  Musculoskeletal:        General: No tenderness or deformity.  Skin:    General: Skin is warm and dry.  Neurological:     General: No focal deficit present.     Mental Status: She is alert. Mental status is at baseline.  Psychiatric:  Mood and Affect: Mood normal.        Behavior: Behavior normal.     ED Results / Procedures / Treatments   Labs (all labs ordered are listed, but only abnormal results are displayed) Labs Reviewed  T4, FREE - Abnormal; Notable for the following components:      Result Value   Free T4 1.56 (*)    All other components within normal limits  BASIC METABOLIC PANEL - Abnormal; Notable for the following components:   Sodium 134 (*)    Potassium 3.2 (*)    Glucose, Bld 108 (*)    Calcium 8.8 (*)    All other components within normal limits  CBC - Abnormal; Notable for the following components:   Hemoglobin 10.2 (*)    HCT 33.8 (*)    MCH 24.6 (*)    Platelets 469 (*)    All other components within normal limits    EKG None  Radiology MR Cervical Spine W or Wo Contrast  Result Date: 08/13/2020 CLINICAL DATA:  45 year old female with multiple sclerosis. Bilateral leg pain and numbness for 3 days. EXAM: MRI CERVICAL AND THORACIC SPINE WITHOUT AND WITH CONTRAST TECHNIQUE: Multiplanar and multiecho pulse sequences of the cervical spine, to include the craniocervical junction and cervicothoracic junction, and the thoracic spine, were obtained without and with intravenous contrast. CONTRAST:  45mL GADAVIST GADOBUTROL 1 MMOL/ML IV SOLN COMPARISON:  Brain MRI 07/11/2020 and earlier. Pelvis ultrasound 01/22/2020. FINDINGS: MRI CERVICAL SPINE FINDINGS Alignment: Straightening and mild reversal of cervical lordosis. Vertebrae: No marrow edema or evidence of acute osseous abnormality. Visualized bone marrow signal is within normal limits. Cord: The cervical spinal cord appears normal. No convincing cord  signal abnormality, and fairly capacious cervical spinal canal. No abnormal intradural enhancement. No dural thickening. Posterior Fossa, vertebral arteries, paraspinal tissues: Cervicomedullary junction is within normal limits. Negative visible posterior fossa. Preserved major vascular flow voids in the neck. Negative visible neck soft tissues and lung apices. Disc levels: Mild disc bulging and endplate spurring at X1-G6 through C6-C7. No associated stenosis. Other findings: Partially visible small 8 mm area of ring enhancement in the left lentiform appears stable on series 35, image 15. MRI THORACIC SPINE FINDINGS Segmentation: Appears to be normal. Alignment: Normal to mildly exaggerated thoracic kyphosis. No spondylolisthesis. Vertebrae: No marrow edema or evidence of acute osseous abnormality. Visualized bone marrow signal is within normal limits. Cord: No thoracic cord signal abnormality, despite spinal stenosis with some cord mass effect at T7-T8 detailed below. Conus medullaris appears normal at L1. No abnormal intradural enhancement. No dural thickening. Paraspinal and other soft tissues: Negative visible chest and upper abdominal viscera. Scout view re-demonstrates bulky and lobulated fibroid uterus (12 cm series 23, image 9). Disc levels: Normal intervertebral disc signal and morphology from T1-T2 through T6-T7. Thoracic epidural lipomatosis is moderate from T4 through T7 and effaces some CSF from the thecal sac at those levels. Superimposed T7-T8 mild disc desiccation and small broad-based central disc protrusion (series 26, image 12 and series 29, image 23). Mild spinal stenosis with up to mild cord mass effect. No foraminal involvement or stenosis. T8-T9 through L1-L2 intervertebral disc signal and morphology is normal. IMPRESSION: 1. No evidence of spinal cord demyelinating disease. 2. Mild spinal stenosis at T7-T8 related to small disc herniation and superimposed thoracic epidural lipomatosis. Up to  mild spinal cord mass effect, but no cord signal abnormality. 3. No other significant thoracic spine degeneration. Mild cervical disc bulging with no associated neural impingement.  4. 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 5. Bulky fibroid uterus. Electronically Signed   By: Genevie Ann M.D.   On: 08/13/2020 07:05   MR THORACIC SPINE W WO CONTRAST  Result Date: 08/13/2020 CLINICAL DATA:  45 year old female with multiple sclerosis. Bilateral leg pain and numbness for 3 days. EXAM: MRI CERVICAL AND THORACIC SPINE WITHOUT AND WITH CONTRAST TECHNIQUE: Multiplanar and multiecho pulse sequences of the cervical spine, to include the craniocervical junction and cervicothoracic junction, and the thoracic spine, were obtained without and with intravenous contrast. CONTRAST:  32mL GADAVIST GADOBUTROL 1 MMOL/ML IV SOLN COMPARISON:  Brain MRI 07/11/2020 and earlier. Pelvis ultrasound 01/22/2020. FINDINGS: MRI CERVICAL SPINE FINDINGS Alignment: Straightening and mild reversal of cervical lordosis. Vertebrae: No marrow edema or evidence of acute osseous abnormality. Visualized bone marrow signal is within normal limits. Cord: The cervical spinal cord appears normal. No convincing cord signal abnormality, and fairly capacious cervical spinal canal. No abnormal intradural enhancement. No dural thickening. Posterior Fossa, vertebral arteries, paraspinal tissues: Cervicomedullary junction is within normal limits. Negative visible posterior fossa. Preserved major vascular flow voids in the neck. Negative visible neck soft tissues and lung apices. Disc levels: Mild disc bulging and endplate spurring at X5-Q0 through C6-C7. No associated stenosis. Other findings: Partially visible small 8 mm area of ring enhancement in the left lentiform appears stable on series 35, image 15. MRI THORACIC SPINE FINDINGS Segmentation: Appears to be normal. Alignment: Normal to mildly exaggerated thoracic  kyphosis. No spondylolisthesis. Vertebrae: No marrow edema or evidence of acute osseous abnormality. Visualized bone marrow signal is within normal limits. Cord: No thoracic cord signal abnormality, despite spinal stenosis with some cord mass effect at T7-T8 detailed below. Conus medullaris appears normal at L1. No abnormal intradural enhancement. No dural thickening. Paraspinal and other soft tissues: Negative visible chest and upper abdominal viscera. Scout view re-demonstrates bulky and lobulated fibroid uterus (12 cm series 23, image 9). Disc levels: Normal intervertebral disc signal and morphology from T1-T2 through T6-T7. Thoracic epidural lipomatosis is moderate from T4 through T7 and effaces some CSF from the thecal sac at those levels. Superimposed T7-T8 mild disc desiccation and small broad-based central disc protrusion (series 26, image 12 and series 29, image 23). Mild spinal stenosis with up to mild cord mass effect. No foraminal involvement or stenosis. T8-T9 through L1-L2 intervertebral disc signal and morphology is normal. IMPRESSION: 1. No evidence of spinal cord demyelinating disease. 2. Mild spinal stenosis at T7-T8 related to small disc herniation and superimposed thoracic epidural lipomatosis. Up to mild spinal cord mass effect, but no cord signal abnormality. 3. No other significant thoracic spine degeneration. Mild cervical disc bulging with no associated neural impingement. 4. 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 5. Bulky fibroid uterus. Electronically Signed   By: Genevie Ann M.D.   On: 08/13/2020 07:05    Procedures Procedures   Medications Ordered in ED Medications  HYDROmorphone (DILAUDID) injection 1 mg (1 mg Intravenous Given 08/14/20 0359)  potassium chloride SA (KLOR-CON) CR tablet 40 mEq (40 mEq Oral Given 08/14/20 0502)    ED Course  I have reviewed the triage vital signs and the nursing notes.  Pertinent labs & imaging results  that were available during my care of the patient were reviewed by me and considered in my medical decision making (see chart for details).   45 year old female presenting back to the emergency department with complaint of continued,  migrating pain which she describes as muscle spasms, now felt primarily in her lower mid back.  This migrating pattern lowers my suspicion significantly for a lumbar spinal lesion related to her lower extremity symptoms yesterday.  It also seems inconsistent with MS.  Likewise she does not have focal weakness of the lower extremities suggest come bradycardia syndrome, and her history of recurrent symptoms also makes this less likely.  She had labs yesterday, will repeat the basic labs today to recheck her potassium level.  Also add on a T4 level given her TSH was elevated.  I have ordered her IV Dilaudid.  She will need medical admission for pain control, as this is her fourth ER visit in 4 days.  Prior medical records reviewed by myself.  Her covid/flu PCR yesterday was negative.  She has no active infectious symptoms.  Do not believe need to repeat PCR at this time.    Final Clinical Impression(s) / ED Diagnoses Final diagnoses:  Muscle spasm    Rx / DC Orders ED Discharge Orders         Ordered    potassium chloride SA (KLOR-CON) 20 MEQ tablet  2 times daily        08/14/20 0847           Wyvonnia Dusky, MD 08/14/20 1431

## 2020-08-14 NOTE — Consult Note (Signed)
ER Consult Note    Connie Herrera OZD:[id number] DOB: 09-Sep-1975 DOA: 08/14/2020  PCP: Jilda Panda Consultants:  [facility] - neurology; [facility] - OB/GYN Patient coming from:  Home - lives alone; NOKPandora Leiter, [phone]   Chief Complaint: Refractory muscle spasms   HPI: Connie Herrera is a 45 y.o. female with medical history significant of HTN presenting with refractory muscle spasms.  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  Thoracic and cervical spine MRIs overnight unremarkable.  She has been having diffuse muscle spasms - primarily lower body but possibly all over her body.  She was given K+ last night and this seems to have improved.  She thinks she can probably go home now because it has much improved. She prefers to stay later to see how she does.  She has been taking HCTZ for some time, last dose maybe 3 days ago.      ED Course:  Carryover, per Dr. Nevada Crane:  45 y.o. female presents with continued muscle pain, spasms. 4th visit to ER in 4 days. Work up in the ED unrevealing. EDP requests admission for pain control.   Review of Systems: As per HPI; otherwise review of systems reviewed and negative.   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 Brain lesion   . COVID-19 04/2020  . Diplopia   . Hypertension     Past Surgical History:  Procedure Laterality Date  . TUBAL LIGATION      Social History   Socioeconomic History  . Marital status: Single    Spouse name: Not on file  . Number of children: 4  . Years of education: 40  . Highest education level: Some college, no degree  Occupational History    Employer: [employer]    Comment: chef  Tobacco Use  . Smoking status: Former Smoker    Packs/day: 0.50    Years: 4.00    Pack years: 2.00    Types: Cigarettes    Quit date: 06/04/2017    Years  since quitting: 3.1  . Smokeless tobacco: Never Used  Vaping Use  . Vaping Use: Never used  Substance and Sexual Activity  . Alcohol use: No  . Drug use: Yes    Types: Marijuana    Comment: maybe once a week  . Sexual activity: Never    Birth control/protection: Surgical  Other Topics Concern  . Not on file  Social History Narrative   Patient lives at home with children.    Patient has 4 children.    Patient is single.    Patient has 2 years of college.    Patient is right handed.    Patient is working full time. [employer].    Social Determinants of Health   Financial Resource Strain: Not on file  Food Insecurity: No Food Insecurity  . Worried About Charity fundraiser in the Last Year: Never true  . Ran Out of Food in the Last Year: Never true  Transportation Needs: No Transportation Needs  . Lack of Transportation (Medical): No  . Lack of Transportation (Non-Medical): No  Physical Activity: Not on file  Stress: Not on file  Social Connections: Not on file  Intimate Partner Violence: Not on file    Allergies  Allergen Reactions  . Hydrocodone Nausea And Vomiting  Patient states that she can take medication it just makes her have bad nausea    Family History  Problem Relation Age of Onset  . Healthy Mother   . Healthy Father   . Cancer Maternal Grandmother 63       breast  . Breast cancer Maternal Grandmother     Prior to Admission medications   Medication Sig Start Date End Date Taking? Authorizing Provider  acyclovir (ZOVIRAX) 200 MG capsule Take 2 capsules (400 mg total) by mouth 3 (three) times daily. Take for 5 days prn each outbreak. 03/18/20   Shelly Bombard, MD  amLODipine (NORVASC) 5 MG tablet Take 1 tablet (5 mg total) by mouth daily. 08/13/20   Lajean Saver, MD  cyclobenzaprine (FLEXERIL) 10 MG tablet Take 1 tablet (10 mg total) by mouth 3 (three) times daily as needed for muscle spasms. 08/11/20   Raspet, Derry Skill, PA-C  methocarbamol  (ROBAXIN) 500 MG tablet Take 1 tablet (500 mg total) by mouth 2 (two) times daily as needed for muscle spasms. 08/12/20   Caccavale, Sophia, PA-C  naproxen (NAPROSYN) 500 MG tablet Take 1 tablet (500 mg total) by mouth 2 (two) times daily with a meal. 08/12/20   Caccavale, Sophia, PA-C  potassium chloride (KLOR-CON 10) 10 MEQ tablet Take 1 tablet (10 mEq total) by mouth daily. 06/20/20   Brunetta Jeans, PA-C  hydrochlorothiazide (HYDRODIURIL) 25 MG tablet Take 1 tablet (25 mg total) by mouth daily. Must have office visit for refills 06/20/20 08/13/20  Brunetta Jeans, [place]    Physical Exam: Vitals:   08/14/20 0345 08/14/20 0500 08/14/20 0700 08/14/20 0837  BP: (!) 133/95 (!) 142/102 136/87 137/89  Pulse: (!) 102 90 94 95  Resp: 16 18 18  (!) 21  Temp:      TempSrc:      SpO2: 100% 98% 100% 100%     . General:  Appears calm and comfortable and is in NAD . Eyes:  PERRL, EOMI, normal lids, iris . ENT:  grossly normal hearing, lips & tongue, mmm . Neck:  no LAD, masses or thyromegaly . Cardiovascular:  RRR, no m/r/g. No LE edema.  Marland Kitchen Respiratory:   CTA bilaterally with no wheezes/rales/rhonchi.  Normal respiratory effort. . Abdomen:  soft, NT, ND . Skin:  no rash or induration seen on limited exam . Musculoskeletal:  grossly normal tone BUE/BLE, good ROM, no bony abnormality . Psychiatric:  grossly normal mood and affect, speech fluent and appropriate, AOx3 . Neurologic:  CN 2-12 grossly intact, moves all extremities in coordinated fashion    Radiological Exams on Admission: Independently reviewed - see discussion in A/P where applicable  MR Cervical Spine W or Wo Contrast  Result Date: 08/13/2020 CLINICAL DATA:  45 year old female with multiple sclerosis. Bilateral leg pain and numbness for 3 days. EXAM: MRI CERVICAL AND THORACIC SPINE WITHOUT AND WITH CONTRAST TECHNIQUE: Multiplanar and multiecho pulse sequences of the cervical spine, to include the craniocervical junction and  cervicothoracic junction, and the thoracic spine, were obtained without and with intravenous contrast. CONTRAST:  48mL GADAVIST GADOBUTROL 1 MMOL/ML IV SOLN COMPARISON:  Brain MRI 07/11/2020 and earlier. Pelvis ultrasound 01/22/2020. FINDINGS: MRI CERVICAL SPINE FINDINGS Alignment: Straightening and mild reversal of cervical lordosis. Vertebrae: No marrow edema or evidence of acute osseous abnormality. Visualized bone marrow signal is within normal limits. Cord: The cervical spinal cord appears normal. No convincing cord signal abnormality, and fairly capacious cervical spinal canal. No abnormal intradural enhancement. No dural thickening. Posterior  Fossa, vertebral arteries, paraspinal tissues: Cervicomedullary junction is within normal limits. Negative visible posterior fossa. Preserved major vascular flow voids in the neck. Negative visible neck soft tissues and lung apices. Disc levels: Mild disc bulging and endplate spurring at D34-534 through C6-C7. No associated stenosis. Other findings: Partially visible small 8 mm area of ring enhancement in the left lentiform appears stable on series 35, image 15. MRI THORACIC SPINE FINDINGS Segmentation: Appears to be normal. Alignment: Normal to mildly exaggerated thoracic kyphosis. No spondylolisthesis. Vertebrae: No marrow edema or evidence of acute osseous abnormality. Visualized bone marrow signal is within normal limits. Cord: No thoracic cord signal abnormality, despite spinal stenosis with some cord mass effect at T7-T8 detailed below. Conus medullaris appears normal at L1. No abnormal intradural enhancement. No dural thickening. Paraspinal and other soft tissues: Negative visible chest and upper abdominal viscera. Scout view re-demonstrates bulky and lobulated fibroid uterus (12 cm series 23, image 9). Disc levels: Normal intervertebral disc signal and morphology from T1-T2 through T6-T7. Thoracic epidural lipomatosis is moderate from T4 through T7 and effaces some  CSF from the thecal sac at those levels. Superimposed T7-T8 mild disc desiccation and small broad-based central disc protrusion (series 26, image 12 and series 29, image 23). Mild spinal stenosis with up to mild cord mass effect. No foraminal involvement or stenosis. T8-T9 through L1-L2 intervertebral disc signal and morphology is normal. IMPRESSION: 1. No evidence of spinal cord demyelinating disease. 2. Mild spinal stenosis at T7-T8 related to small disc herniation and superimposed thoracic epidural lipomatosis. Up to mild spinal cord mass effect, but no cord signal abnormality. 3. No other significant thoracic spine degeneration. Mild cervical disc bulging with no associated neural impingement. 4. 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 5. Bulky fibroid uterus. Electronically Signed   By: Genevie Ann M.D.   On: 08/13/2020 07:05   MR THORACIC SPINE W WO CONTRAST  Result Date: 08/13/2020 CLINICAL DATA:  45 year old female with multiple sclerosis. Bilateral leg pain and numbness for 3 days. EXAM: MRI CERVICAL AND THORACIC SPINE WITHOUT AND WITH CONTRAST TECHNIQUE: Multiplanar and multiecho pulse sequences of the cervical spine, to include the craniocervical junction and cervicothoracic junction, and the thoracic spine, were obtained without and with intravenous contrast. CONTRAST:  81mL GADAVIST GADOBUTROL 1 MMOL/ML IV SOLN COMPARISON:  Brain MRI 07/11/2020 and earlier. Pelvis ultrasound 01/22/2020. FINDINGS: MRI CERVICAL SPINE FINDINGS Alignment: Straightening and mild reversal of cervical lordosis. Vertebrae: No marrow edema or evidence of acute osseous abnormality. Visualized bone marrow signal is within normal limits. Cord: The cervical spinal cord appears normal. No convincing cord signal abnormality, and fairly capacious cervical spinal canal. No abnormal intradural enhancement. No dural thickening. Posterior Fossa, vertebral arteries, paraspinal tissues:  Cervicomedullary junction is within normal limits. Negative visible posterior fossa. Preserved major vascular flow voids in the neck. Negative visible neck soft tissues and lung apices. Disc levels: Mild disc bulging and endplate spurring at D34-534 through C6-C7. No associated stenosis. Other findings: Partially visible small 8 mm area of ring enhancement in the left lentiform appears stable on series 35, image 15. MRI THORACIC SPINE FINDINGS Segmentation: Appears to be normal. Alignment: Normal to mildly exaggerated thoracic kyphosis. No spondylolisthesis. Vertebrae: No marrow edema or evidence of acute osseous abnormality. Visualized bone marrow signal is within normal limits. Cord: No thoracic cord signal abnormality, despite spinal stenosis with some cord mass effect at T7-T8 detailed below. Conus medullaris appears normal at L1. No abnormal intradural enhancement.  No dural thickening. Paraspinal and other soft tissues: Negative visible chest and upper abdominal viscera. Scout view re-demonstrates bulky and lobulated fibroid uterus (12 cm series 23, image 9). Disc levels: Normal intervertebral disc signal and morphology from T1-T2 through T6-T7. Thoracic epidural lipomatosis is moderate from T4 through T7 and effaces some CSF from the thecal sac at those levels. Superimposed T7-T8 mild disc desiccation and small broad-based central disc protrusion (series 26, image 12 and series 29, image 23). Mild spinal stenosis with up to mild cord mass effect. No foraminal involvement or stenosis. T8-T9 through L1-L2 intervertebral disc signal and morphology is normal. IMPRESSION: 1. No evidence of spinal cord demyelinating disease. 2. Mild spinal stenosis at T7-T8 related to small disc herniation and superimposed thoracic epidural lipomatosis. Up to mild spinal cord mass effect, but no cord signal abnormality. 3. No other significant thoracic spine degeneration. Mild cervical disc bulging with no associated neural  impingement. 4. 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 5. Bulky fibroid uterus. Electronically Signed   By: Genevie Ann M.D.   On: 08/13/2020 07:05    EKG: not done   Labs on Admission: I have personally reviewed the available labs and imaging studies at the time of the admission.  Pertinent labs:   Na++ 134 K+ 3.2 Glucose 108 WBC 6.4 Hgb 10.2 Platelets 469 TSH 5.430 Free T4 1.56 COVID/flu negative (5/10)   Assessment/Plan Principal Problem:   Muscle spasms of both lower extremities Active Problems:   Hypertension   Class 1 obesity due to excess calories with body mass index (BMI) of 34.0 to 34.9 in adult   Fibroid uterus   Muscle spasms -Patient presenting with diffuse muscle spasms -Her potassium was only mildly decreased, but she had essentially resolution of symptoms with potassium supplementation so this appears to be the cause -Her HCTZ has been stopped -Mustard packets also provided -Based on resolution of symptoms, the patient prefers d/c to home at this time  HTN -As noted above, symptoms were thought to be due to hypokalemia associated with HCTZ -HCTZ has been stopped -Norvasc has been prescribed and she is encouraged to pick this up from the pharmacy and start taking it -Needs PCP f/u later this week  Fibroid uterus -Has plans for GYN f/u and surgery due to menorrhagia  Basal ganglia lesion -Stable on imaging -Sees Dr. Leta Baptist, continue outpatient care as needed  Obesity -BMI 34.14 -Weight loss should be encouraged -Outpatient PCP/bariatric medicine/bariatric surgery f/u encouraged   Thank you for this interesting consult.  Based on resolution of symptoms, the patient no longer requires observation and has been discharged to home.    Karmen Bongo MD [facility]   How to contact the [facility] Attending or Consulting provider [place] or covering provider during after hours [place], for this patient?   1. Check the care team in [facility] and look for a) attending/consulting [facility] provider listed and b) the [facility] team listed 2. Log into [email or web address] and use 's universal password to access. If you do not have the password, please contact the hospital operator. 3. Locate the [facility] provider you are looking for under [facility] and page to a number that you can be directly reached. 4. If you still have difficulty reaching the provider, please page the [facility] (Director on Call) for the Hospitalists listed on amion for assistance.   08/14/2020, 10:01 AM

## 2020-08-27 ENCOUNTER — Ambulatory Visit: Payer: 59 | Admitting: Diagnostic Neuroimaging

## 2020-08-28 ENCOUNTER — Encounter: Payer: Self-pay | Admitting: *Deleted

## 2020-09-10 ENCOUNTER — Other Ambulatory Visit: Payer: Self-pay

## 2020-09-10 ENCOUNTER — Emergency Department (HOSPITAL_COMMUNITY)
Admission: EM | Admit: 2020-09-10 | Discharge: 2020-09-10 | Disposition: A | Discharge status: Home / Self Care | Payer: 59 | Attending: Emergency Medicine | Admitting: Emergency Medicine

## 2020-09-10 ENCOUNTER — Encounter (HOSPITAL_COMMUNITY): Payer: Self-pay | Admitting: Emergency Medicine

## 2020-09-10 DIAGNOSIS — Z79899 Other long term (current) drug therapy: Secondary | ICD-10-CM | POA: Diagnosis not present

## 2020-09-10 DIAGNOSIS — Z8616 Personal history of COVID-19: Secondary | ICD-10-CM | POA: Diagnosis not present

## 2020-09-10 DIAGNOSIS — Z87891 Personal history of nicotine dependence: Secondary | ICD-10-CM | POA: Diagnosis not present

## 2020-09-10 DIAGNOSIS — I10 Essential (primary) hypertension: Secondary | ICD-10-CM | POA: Insufficient documentation

## 2020-09-10 DIAGNOSIS — M62838 Other muscle spasm: Secondary | ICD-10-CM | POA: Insufficient documentation

## 2020-09-10 LAB — URINALYSIS, ROUTINE W REFLEX MICROSCOPIC
Bilirubin Urine: NEGATIVE
Glucose, UA: NEGATIVE mg/dL
Hgb urine dipstick: NEGATIVE
Ketones, ur: NEGATIVE mg/dL
Leukocytes,Ua: NEGATIVE
Nitrite: NEGATIVE
Protein, ur: NEGATIVE mg/dL
Specific Gravity, Urine: 1.011 (ref 1.005–1.030)
pH: 8 (ref 5.0–8.0)

## 2020-09-10 LAB — I-STAT BETA HCG BLOOD, ED (MC, WL, AP ONLY): I-stat hCG, quantitative: <5 m[IU]/mL (ref <5)

## 2020-09-10 LAB — COMPREHENSIVE METABOLIC PANEL
ALT: 23 U/L (ref 0–44)
AST: 19 U/L (ref 15–41)
Albumin: 3.7 g/dL (ref 3.5–5.0)
Alkaline Phosphatase: 68 U/L (ref 38–126)
Anion gap: 7 (ref 5–15)
BUN: 11 mg/dL (ref 6–20)
CO2: 25 mmol/L (ref 22–32)
Calcium: 8.7 mg/dL — ABNORMAL LOW (ref 8.9–10.3)
Chloride: 102 mmol/L (ref 98–111)
Creatinine, Ser: 0.99 mg/dL (ref 0.44–1.00)
GFR, Estimated: >60 mL/min (ref >60)
Glucose, Bld: 127 mg/dL — ABNORMAL HIGH (ref 70–99)
Potassium: 3.5 mmol/L (ref 3.5–5.1)
Sodium: 134 mmol/L — ABNORMAL LOW (ref 135–145)
Total Bilirubin: 0.3 mg/dL (ref 0.3–1.2)
Total Protein: 7.2 g/dL (ref 6.5–8.1)

## 2020-09-10 LAB — CBC
HCT: 36.1 % (ref 36.0–46.0)
Hemoglobin: 11.2 g/dL — ABNORMAL LOW (ref 12.0–15.0)
MCH: 24.9 pg — ABNORMAL LOW (ref 26.0–34.0)
MCHC: 31 g/dL (ref 30.0–36.0)
MCV: 80.4 fL (ref 80.0–100.0)
Platelets: 493 10*3/uL — ABNORMAL HIGH (ref 150–400)
RBC: 4.49 MIL/uL (ref 3.87–5.11)
RDW: 16.1 % — ABNORMAL HIGH (ref 11.5–15.5)
WBC: 7.7 10*3/uL (ref 4.0–10.5)
nRBC: 0 % (ref 0.0–0.2)

## 2020-09-10 LAB — CK: Total CK: 298 U/L — ABNORMAL HIGH (ref 38–234)

## 2020-09-10 LAB — T4, FREE: Free T4: 0.98 ng/dL (ref 0.61–1.12)

## 2020-09-10 LAB — TSH: TSH: 4.143 u[IU]/mL (ref 0.350–4.500)

## 2020-09-10 MED ORDER — DIAZEPAM 2 MG PO TABS: 2.0000 mg | ORAL_TABLET | Freq: Four times a day (QID) | ORAL | 0 refills | Status: DC | PRN

## 2020-09-10 MED ORDER — OXYCODONE-ACETAMINOPHEN 5-325 MG PO TABS
1.0000 | ORAL_TABLET | Freq: Once | ORAL | Status: AC
  Administered 2020-09-10: 1 via ORAL
  Filled 2020-09-10: qty 1

## 2020-09-10 MED ORDER — DIAZEPAM 5 MG PO TABS
5.0000 mg | ORAL_TABLET | Freq: Once | ORAL | Status: AC
  Administered 2020-09-10: 5 mg via ORAL
  Filled 2020-09-10: qty 1

## 2020-09-10 NOTE — ED Triage Notes (Signed)
Patient reports muscle spasms at bilateral legs more at right side radiating to lower back this week worse toady , denies injury/ambulatory.

## 2020-09-10 NOTE — ED Provider Notes (Signed)
Emergency Medicine Provider Triage Evaluation Note  Connie Herrera , a 45 y.o. female  was evaluated in triage.  Pt complains of muscle spasms.  The patient reports muscle spasms that are all over, but they are worse in her legs.  She has a history of similar and was seen multiple times for the same last month in the emergency department.  She states that the spasms and pain in her right leg radiate up into her lower back.  She took cyclobenzaprine and methocarbamol earlier today without improvement.  She has also been trying to take her potassium chloride supplements.  She reports that this is her first visit for the same in about 3 weeks.  She is continue to have some spasms, but not as severe as tonight.  Since she is taking the cyclobenzaprine and methocarbamol she developed palpitations.  She also reports that she has been urinating frequently.  She otherwise denies chest pain, shortness of breath, leg swelling fever, chills, vomiting, diarrhea  Review of Systems  Positive: Muscle cramps, urinary frequency, palpitations Negative: Chest pain, shortness of breath, leg swelling, fever, chills, vomiting, diarrhea  Physical Exam  Ht 5\' 7"  (1.702 m)   Wt 109 kg   LMP 09/02/2020   BMI 37.64 kg/m  Gen:   Awake, writhing around in the chair, tearful Resp:  Normal effort  MSK:   Moves extremities without difficulty  Other:  Neurovascularly intact to the upper and lower extremities   Medical Decision Making  Medically screening exam initiated at 2:43 AM.  Appropriate orders placed.  ELEN ACERO was informed that the remainder of the evaluation will be completed by another provider, this initial triage assessment does not replace that evaluation, and the importance of remaining in the ED until their evaluation is complete.  Patient's care has been initiated in the ED.   Joline Maxcy A, PA-C 09/10/20 0248    Merryl Hacker, MD 09/10/20 [phone]

## 2020-09-10 NOTE — ED Provider Notes (Signed)
[facility] EMERGENCY DEPARTMENT Provider Note   CSN: [id number] Arrival date & time: 09/10/20  0204     History Chief Complaint  Patient presents with  . Muscle Spasms ( Legs )    Connie Herrera is a 45 y.o. female.  45 year old female presents with 1 month history of recurrent diffuse muscle spasms.  Patient seen for same on 11 May and had a complete work-up including MRIs of her thoracic lumbar spine all of which were negative.  Patient had a recommend admission for pain control.  Since that time she has seen her PCP and has been given multiple medications to try to help with the symptoms without relief.  States the spasms wax and wane and nothing seems to make them better or worse.  Taking hydrochlorothiazide which she stopped taking.  Had been on potassium tablets which is a she is off at this point.        Past Medical History:  Diagnosis Date  . Brain lesion   . COVID-19 04/2020  . Diplopia   . Hypertension     Patient Active Problem List   Diagnosis Date Noted  . Muscle spasms of both lower extremities 08/14/2020  . Fibroid uterus 08/14/2020  . Anemia due to blood loss, chronic 01/10/2020  . Menorrhagia with regular cycle 01/09/2020  . Class 1 obesity due to excess calories with body mass index (BMI) of 34.0 to 34.9 in adult 01/09/2020  . Screening breast examination 02/07/2019  . Breast pain 02/07/2019  . Hypertension 01/26/2019  . Paresthesias 08/04/2013  . Breast pain, right 03/11/2012    Past Surgical History:  Procedure Laterality Date  . TUBAL LIGATION       OB History    Gravida  4   Para  4   Term  4   Preterm  0   AB  0   Living  4     SAB  0   IAB  0   Ectopic  0   Multiple  0   Live Births  4           Family History  Problem Relation Age of Onset  . Healthy Mother   . Healthy Father   . Cancer Maternal Grandmother 69       breast  . Breast cancer Maternal Grandmother     Social History    Tobacco Use  . Smoking status: Former Smoker    Packs/day: 0.50    Years: 4.00    Pack years: 2.00    Types: Cigarettes    Quit date: 06/04/2017    Years since quitting: 3.2  . Smokeless tobacco: Never Used  Vaping Use  . Vaping Use: Never used  Substance Use Topics  . Alcohol use: No  . Drug use: Yes    Types: Marijuana    Comment: maybe once a week    Home Medications Prior to Admission medications   Medication Sig Start Date End Date Taking? Authorizing Provider  acyclovir (ZOVIRAX) 200 MG capsule Take 2 capsules (400 mg total) by mouth 3 (three) times daily. Take for 5 days prn each outbreak. 03/18/20   Shelly Bombard, MD  amLODipine (NORVASC) 5 MG tablet Take 1 tablet (5 mg total) by mouth daily. 08/13/20   Lajean Saver, MD  cyclobenzaprine (FLEXERIL) 10 MG tablet Take 1 tablet (10 mg total) by mouth 3 (three) times daily as needed for muscle spasms. 08/11/20   Raspet, Derry Skill, PA-C  methocarbamol (ROBAXIN) 500 MG tablet Take 1 tablet (500 mg total) by mouth 2 (two) times daily as needed for muscle spasms. 08/12/20   Caccavale, Sophia, PA-C  naproxen (NAPROSYN) 500 MG tablet Take 1 tablet (500 mg total) by mouth 2 (two) times daily with a meal. 08/12/20   Caccavale, Sophia, PA-C  potassium chloride (KLOR-CON 10) 10 MEQ tablet Take 1 tablet (10 mEq total) by mouth daily. 06/20/20   Brunetta Jeans, PA-C  potassium chloride SA (KLOR-CON) 20 MEQ tablet Take 2 tablets (40 mEq total) by mouth 2 (two) times daily for 5 days. 08/14/20 08/19/20  Deno Etienne, DO  hydrochlorothiazide (HYDRODIURIL) 25 MG tablet Take 1 tablet (25 mg total) by mouth daily. Must have office visit for refills 06/20/20 08/13/20  Brunetta Jeans, PA-C    Allergies    Hydrocodone  Review of Systems   Review of Systems  All other systems reviewed and are negative.   Physical Exam Updated Vital Signs BP (!) 181/110 (BP Location: Right Arm)   Pulse 86   Temp 98.3 F (36.8 C) (Oral)   Resp 16   Ht 1.702 m  (5\' 7" )   Wt 109 kg   LMP 09/02/2020   SpO2 100%   BMI 37.64 kg/m   Physical Exam Vitals and nursing note reviewed.  Constitutional:      General: She is not in acute distress.    Appearance: Normal appearance. She is well-developed. She is not toxic-appearing.  HENT:     Head: Normocephalic and atraumatic.  Eyes:     General: Lids are normal.     Conjunctiva/sclera: Conjunctivae normal.     Pupils: Pupils are equal, round, and reactive to light.  Neck:     Thyroid: No thyroid mass.     Trachea: No tracheal deviation.  Cardiovascular:     Rate and Rhythm: Normal rate and regular rhythm.     Heart sounds: Normal heart sounds. No murmur heard. No gallop.   Pulmonary:     Effort: Pulmonary effort is normal. No respiratory distress.     Breath sounds: Normal breath sounds. No stridor. No decreased breath sounds, wheezing, rhonchi or rales.  Abdominal:     General: Bowel sounds are normal. There is no distension.     Palpations: Abdomen is soft.     Tenderness: There is no abdominal tenderness. There is no rebound.  Musculoskeletal:        General: No tenderness. Normal range of motion.     Cervical back: Normal range of motion and neck supple.  Skin:    General: Skin is warm and dry.     Findings: No abrasion or rash.  Neurological:     Mental Status: She is alert and oriented to person, place, and time.     GCS: GCS eye subscore is 4. GCS verbal subscore is 5. GCS motor subscore is 6.     Cranial Nerves: No cranial nerve deficit.     Sensory: No sensory deficit.  Psychiatric:        Speech: Speech normal.        Behavior: Behavior normal.     ED Results / Procedures / Treatments   Labs (all labs ordered are listed, but only abnormal results are displayed) Labs Reviewed  CK - Abnormal; Notable for the following components:      Result Value   Total CK 298 (*)    All other components within normal limits  CBC - Abnormal; Notable for  the following components:    Hemoglobin 11.2 (*)    MCH 24.9 (*)    RDW 16.1 (*)    Platelets 493 (*)    All other components within normal limits  COMPREHENSIVE METABOLIC PANEL - Abnormal; Notable for the following components:   Sodium 134 (*)    Glucose, Bld 127 (*)    Calcium 8.7 (*)    All other components within normal limits  URINALYSIS, ROUTINE W REFLEX MICROSCOPIC - Abnormal; Notable for the following components:   APPearance CLOUDY (*)    Bacteria, UA RARE (*)    All other components within normal limits  TSH  T4, FREE  I-STAT BETA HCG BLOOD, ED ([facility], [facility], [facility] ONLY)    EKG EKG Interpretation  Date/Time:  Tuesday September 10 2020 02:43:47 EDT Ventricular Rate:  85 PR Interval:  194 QRS Duration: 82 QT Interval:  382 QTC Calculation: 330 R Axis:   26 Text Interpretation: Normal sinus rhythm Normal ECG Confirmed by Lacretia Leigh ([id number]) on 09/10/2020 11:51:53 AM   Radiology No results found.  Procedures Procedures   Medications Ordered in ED Medications  diazepam (VALIUM) tablet 5 mg (has no administration in time range)  oxyCODONE-acetaminophen (PERCOCET/ROXICET) 5-325 MG per tablet 1 tablet (1 tablet Oral Given 09/10/20 0256)  oxyCODONE-acetaminophen (PERCOCET/ROXICET) 5-325 MG per tablet 1 tablet (1 tablet Oral Given 09/10/20 0808)    ED Course  I have reviewed the triage vital signs and the nursing notes.  Pertinent labs & imaging results that were available during my care of the patient were reviewed by me and considered in my medical decision making (see chart for details).    MDM Rules/Calculators/A&P                         Patient's labs reviewed here including a CK which is only mildly elevated at 298.  Her potassium is normal.  Thyroid function studies are normal.  Patient is very frustrated that it has been over a month and no clear etiology has been found for her symptoms.  Patient has follow-up scheduled with other physicians for later this week.  Have given patient Valium here and  will prescribe same and given return precautions Final Clinical Impression(s) / ED Diagnoses Final diagnoses:  None    Rx / DC Orders ED Discharge Orders    None       Lacretia Leigh, MD 09/10/20 1215

## 2020-09-10 NOTE — ED Notes (Signed)
Discharge paperwork given to pt along with prescriptions. Pt agreeable to discharge and understands instructions. Esignature pad not working.

## 2020-09-25 ENCOUNTER — Telehealth: Payer: 59 | Admitting: Physician Assistant

## 2020-09-25 DIAGNOSIS — H109 Unspecified conjunctivitis: Secondary | ICD-10-CM | POA: Diagnosis not present

## 2020-09-25 MED ORDER — POLYMYXIN B-TRIMETHOPRIM 10000-0.1 UNIT/ML-% OP SOLN: OPHTHALMIC | 0 refills | Status: DC

## 2020-09-25 NOTE — Progress Notes (Signed)
Virtual Visit Consent   Connie Herrera, you are scheduled for a virtual visit with a [facility] provider today.     Just as with appointments in the office, your consent must be obtained to participate.  Your consent will be active for this visit and any virtual visit you may have with one of our providers in the next 365 days.     If you have a MyChart account, a copy of this consent can be sent to you electronically.  All virtual visits are billed to your insurance company just like a traditional visit in the office.    As this is a virtual visit, video technology does not allow for your provider to perform a traditional examination.  This may limit your provider's ability to fully assess your condition.  If your provider identifies any concerns that need to be evaluated in person or the need to arrange testing (such as labs, EKG, etc.), we will make arrangements to do so.     Although advances in technology are sophisticated, we cannot ensure that it will always work on either your end or our end.  If the connection with a video visit is poor, the visit may have to be switched to a telephone visit.  With either a video or telephone visit, we are not always able to ensure that we have a secure connection.     I need to obtain your verbal consent now.   Are you willing to proceed with your visit today?    Connie Herrera has provided verbal consent on 09/25/2020 for a virtual visit (video or telephone).   Leeanne Rio, [place]   Date: 09/25/2020 11:24 AM  Virtual Visit via Video Note   I, Leeanne Rio, connected with Connie Herrera ([id number], [birth year]) on 09/25/20 at 11:15 AM EDT by a video-enabled telemedicine application and verified that I am speaking with the correct person using two identifiers.  Location: Patient: Virtual Visit Location Patient: Home Provider: Virtual Visit Location Provider: Home Office   I discussed the limitations of evaluation  and management by telemedicine and the availability of in person appointments. The patient expressed understanding and agreed to proceed.    History of Present Illness: Connie Herrera is a 45 y.o. who identifies as a female who was assigned female at birth, and is being seen today for 3 days of R eye redness with tearing, tenderness and purulent drainage, worse in the morning.  Denies fever, chills. Denies any acute vision changes. 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 Denies symptoms of L eye. Denies any known trauma or injury to the eye. Does not wear contacts. Denies recent travel or sick contact.   Problems:  Patient Active Problem List   Diagnosis Date Noted   Muscle spasms of both lower extremities 08/14/2020   Fibroid uterus 08/14/2020   Anemia due to blood loss, chronic 01/10/2020   Menorrhagia with regular cycle 01/09/2020   Class 1 obesity due to excess calories with body mass index (BMI) of 34.0 to 34.9 in adult 01/09/2020   Screening breast examination 02/07/2019   Breast pain 02/07/2019   Hypertension 01/26/2019   Paresthesias 08/04/2013   Breast pain, right 03/11/2012    Allergies:  Allergies  Allergen Reactions   Hydrocodone Nausea And Vomiting    Patient states that she can take medication it just makes her have bad nausea   Medications:  Current Outpatient Medications:    trimethoprim-polymyxin b (POLYTRIM)  ophthalmic solution, Apply 1-2 drops into affected eye QID x 5 days., Disp: 10 mL, Rfl: 0   acyclovir (ZOVIRAX) 200 MG capsule, Take 2 capsules (400 mg total) by mouth 3 (three) times daily. Take for 5 days prn each outbreak., Disp: 30 capsule, Rfl: 11   amLODipine (NORVASC) 5 MG tablet, Take 1 tablet (5 mg total) by mouth daily., Disp: 30 tablet, Rfl: 0   cyclobenzaprine (FLEXERIL) 10 MG tablet, Take 1 tablet (10 mg total) by mouth 3 (three) times daily as needed for muscle spasms., Disp: 30 tablet, Rfl: 0   diazepam (VALIUM) 2 MG tablet,  Take 1 tablet (2 mg total) by mouth every 6 (six) hours as needed for muscle spasms., Disp: 15 tablet, Rfl: 0   methocarbamol (ROBAXIN) 500 MG tablet, Take 1 tablet (500 mg total) by mouth 2 (two) times daily as needed for muscle spasms., Disp: 14 tablet, Rfl: 0   naproxen (NAPROSYN) 500 MG tablet, Take 1 tablet (500 mg total) by mouth 2 (two) times daily with a meal., Disp: 20 tablet, Rfl: 0   potassium chloride (KLOR-CON 10) 10 MEQ tablet, Take 1 tablet (10 mEq total) by mouth daily., Disp: 30 tablet, Rfl: 0   potassium chloride SA (KLOR-CON) 20 MEQ tablet, Take 2 tablets (40 mEq total) by mouth 2 (two) times daily for 5 days., Disp: 20 tablet, Rfl: 0  Observations/Objective: Patient is well-developed, well-nourished in no acute distress.  Resting comfortably on bed at home.  Head is normocephalic, atraumatic.  No labored breathing.  Speech is clear and coherent with logical content.  Patient is alert and oriented at baseline.  R eye with conjunctiva. Noted drainage. EOMI. No evidence of subconjunctival hemorrhage.  Assessment and Plan: 1. Bacterial conjunctivitis of right eye - trimethoprim-polymyxin b (POLYTRIM) ophthalmic solution; Apply 1-2 drops into affected eye QID x 5 days.  Dispense: 10 mL; Refill: 0 Mild. No alarm signs.symptoms present. Is followed by Neurology for chronic vision changes -- they think she has MS. Will Rx Polytrim OP for bacterial conjunctivitis. Supportive measures and OTC medications reviewed. In person evaluation if symptoms are not resolving, anything worsens or new symptoms develop.  Follow Up Instructions: I discussed the assessment and treatment plan with the patient. The patient was provided an opportunity to ask questions and all were answered. The patient agreed with the plan and demonstrated an understanding of the instructions.  A copy of instructions were sent to the patient via MyChart.  The patient was advised to call back or seek an in-person  evaluation if the symptoms worsen or if the condition fails to improve as anticipated.  Time:  I spent 10 minutes with the patient via telehealth technology discussing the above problems/concerns.    Leeanne Rio, PA-C

## 2020-09-25 NOTE — Patient Instructions (Signed)
[name], thank you for joining Leeanne Rio, PA-C for today's virtual visit.  While this provider is not your primary care provider (PCP), if your PCP is located in our provider database this encounter information will be shared with them immediately following your visit.  Consent: (Patient) [name] provided verbal consent for this virtual visit at the beginning of the encounter.  Current Medications:  Current Outpatient Medications:    trimethoprim-polymyxin b (POLYTRIM) ophthalmic solution, Apply 1-2 drops into affected eye QID x 5 days., Disp: 10 mL, Rfl: 0   acyclovir (ZOVIRAX) 200 MG capsule, Take 2 capsules (400 mg total) by mouth 3 (three) times daily. Take for 5 days prn each outbreak., Disp: 30 capsule, Rfl: 11   amLODipine (NORVASC) 5 MG tablet, Take 1 tablet (5 mg total) by mouth daily., Disp: 30 tablet, Rfl: 0   cyclobenzaprine (FLEXERIL) 10 MG tablet, Take 1 tablet (10 mg total) by mouth 3 (three) times daily as needed for muscle spasms., Disp: 30 tablet, Rfl: 0   diazepam (VALIUM) 2 MG tablet, Take 1 tablet (2 mg total) by mouth every 6 (six) hours as needed for muscle spasms., Disp: 15 tablet, Rfl: 0   methocarbamol (ROBAXIN) 500 MG tablet, Take 1 tablet (500 mg total) by mouth 2 (two) times daily as needed for muscle spasms., Disp: 14 tablet, Rfl: 0   naproxen (NAPROSYN) 500 MG tablet, Take 1 tablet (500 mg total) by mouth 2 (two) times daily with a meal., Disp: 20 tablet, Rfl: 0   potassium chloride (KLOR-CON 10) 10 MEQ tablet, Take 1 tablet (10 mEq total) by mouth daily., Disp: 30 tablet, Rfl: 0   potassium chloride SA (KLOR-CON) 20 MEQ tablet, Take 2 tablets (40 mEq total) by mouth 2 (two) times daily for 5 days., Disp: 20 tablet, Rfl: 0   Medications ordered in this encounter:  Meds ordered this encounter  Medications   trimethoprim-polymyxin b (POLYTRIM) ophthalmic solution    Sig: Apply 1-2 drops into affected eye QID x 5 days.     Dispense:  10 mL    Refill:  0    Order Specific Question:   Supervising Provider    Answer:   Sabra Heck, BRIAN [[id number]]     *If you need refills on other medications prior to your next appointment, please contact your pharmacy*  Follow-Up: Call back or seek an in-person evaluation if the symptoms worsen or if the condition fails to improve as anticipated.  Other Instructions Please use the antibiotic drops as directed. Avoid touching the eye with your bare hands. Keep hands washed. Use a warm wash cloth as a compress over the eye for 10 minutes, 2-3 x daily.   If symptoms are not resolving with treatment given, anything worsens or new symptoms develop, you need an in-person evaluation.  If you have been instructed to have an in-person evaluation today at a local Urgent Care facility, please use the link below. It will take you to a list of all of our available [facility], including address, phone number and hours of operation. Please do not delay care.  [facility]  If you or a family member do not have a primary care provider, use the link below to schedule a visit and establish care. When you choose a [facility] primary care physician or advanced practice provider, you gain a long-term partner in health. Find a Primary Care Provider  Learn more about [facility] in-office and virtual care  options: [facility]

## 2020-09-26 ENCOUNTER — Emergency Department (HOSPITAL_COMMUNITY): Payer: 59

## 2020-09-26 ENCOUNTER — Emergency Department (HOSPITAL_COMMUNITY)
Admission: EM | Admit: 2020-09-26 | Discharge: 2020-09-26 | Discharge status: Against Medical Advice | Payer: 59 | Attending: Emergency Medicine | Admitting: Emergency Medicine

## 2020-09-26 ENCOUNTER — Encounter (HOSPITAL_COMMUNITY): Payer: Self-pay | Admitting: Pharmacy Technician

## 2020-09-26 DIAGNOSIS — R002 Palpitations: Secondary | ICD-10-CM | POA: Diagnosis present

## 2020-09-26 DIAGNOSIS — R0602 Shortness of breath: Secondary | ICD-10-CM | POA: Insufficient documentation

## 2020-09-26 DIAGNOSIS — Z5321 Procedure and treatment not carried out due to patient leaving prior to being seen by health care provider: Secondary | ICD-10-CM | POA: Diagnosis not present

## 2020-09-26 LAB — CBC WITH DIFFERENTIAL/PLATELET
Abs Immature Granulocytes: 0.01 10*3/uL (ref 0.00–0.07)
Basophils Absolute: 0 10*3/uL (ref 0.0–0.1)
Basophils Relative: 0 %
Eosinophils Absolute: 0.1 10*3/uL (ref 0.0–0.5)
Eosinophils Relative: 1 %
HCT: 35.4 % — ABNORMAL LOW (ref 36.0–46.0)
Hemoglobin: 11 g/dL — ABNORMAL LOW (ref 12.0–15.0)
Immature Granulocytes: 0 %
Lymphocytes Relative: 22 %
Lymphs Abs: 1.6 10*3/uL (ref 0.7–4.0)
MCH: 25.2 pg — ABNORMAL LOW (ref 26.0–34.0)
MCHC: 31.1 g/dL (ref 30.0–36.0)
MCV: 81 fL (ref 80.0–100.0)
Monocytes Absolute: 0.4 10*3/uL (ref 0.1–1.0)
Monocytes Relative: 6 %
Neutro Abs: 5.3 10*3/uL (ref 1.7–7.7)
Neutrophils Relative %: 71 %
Platelets: 468 10*3/uL — ABNORMAL HIGH (ref 150–400)
RBC: 4.37 MIL/uL (ref 3.87–5.11)
RDW: 16.8 % — ABNORMAL HIGH (ref 11.5–15.5)
WBC: 7.5 10*3/uL (ref 4.0–10.5)
nRBC: 0 % (ref 0.0–0.2)

## 2020-09-26 LAB — COMPREHENSIVE METABOLIC PANEL
ALT: 17 U/L (ref 0–44)
AST: 15 U/L (ref 15–41)
Albumin: 3.4 g/dL — ABNORMAL LOW (ref 3.5–5.0)
Alkaline Phosphatase: 66 U/L (ref 38–126)
Anion gap: 6 (ref 5–15)
BUN: 12 mg/dL (ref 6–20)
CO2: 27 mmol/L (ref 22–32)
Calcium: 8.8 mg/dL — ABNORMAL LOW (ref 8.9–10.3)
Chloride: 105 mmol/L (ref 98–111)
Creatinine, Ser: 1.06 mg/dL — ABNORMAL HIGH (ref 0.44–1.00)
GFR, Estimated: >60 mL/min (ref >60)
Glucose, Bld: 110 mg/dL — ABNORMAL HIGH (ref 70–99)
Potassium: 3.5 mmol/L (ref 3.5–5.1)
Sodium: 138 mmol/L (ref 135–145)
Total Bilirubin: <0.1 mg/dL — ABNORMAL LOW (ref 0.3–1.2)
Total Protein: 6.6 g/dL (ref 6.5–8.1)

## 2020-09-26 LAB — I-STAT BETA HCG BLOOD, ED (MC, WL, AP ONLY): I-stat hCG, quantitative: <5 m[IU]/mL (ref <5)

## 2020-09-26 LAB — RAPID URINE DRUG SCREEN, HOSP PERFORMED
Amphetamines: NOT DETECTED
Barbiturates: NOT DETECTED
Benzodiazepines: NOT DETECTED
Cocaine: NOT DETECTED
Opiates: NOT DETECTED
Tetrahydrocannabinol: POSITIVE — AB

## 2020-09-26 LAB — TSH: TSH: 1.587 u[IU]/mL (ref 0.350–4.500)

## 2020-09-26 LAB — TROPONIN I (HIGH SENSITIVITY): Troponin I (High Sensitivity): 6 ng/L (ref <18)

## 2020-09-26 NOTE — ED Triage Notes (Signed)
Pt here with palpitations and shob for 2 days. Pt denies chest pain. Pt states the palpitations last for a few seconds several times per day. Pt endorses drinking caffeine throughout the day.

## 2020-09-26 NOTE — ED Notes (Signed)
The patient made the decision to leave AMA

## 2020-09-26 NOTE — ED Provider Notes (Signed)
Emergency Medicine Provider Triage Evaluation Note  Connie Herrera , a 45 y.o. female  was evaluated in triage.  Pt complains of palpitations.  She states that over the past 2 days she has had shortness of breath with occasional palpitations.  They last for few seconds. She states that she drinks about 2-3 bottles of 20 ounces of [facility] a day in addition to 1 cup of coffee and soda.  She does not drink water..  Review of Systems  Positive: Palpitations Negative: Chest pain, syncope  Physical Exam  BP (!) 136/93 (BP Location: Left Arm)   Pulse (!) 117   Temp 98.8 F (37.1 C) (Oral)   Resp 18   LMP 09/02/2020   SpO2 97%  Gen:   Awake, no distress   Resp:  Normal effort  MSK:   Moves extremities without difficulty  Other:  Patient is awake and alert, generally well-appearing.  Speech is nonslurred.  Answers all questions without difficulty.  Medical Decision Making  Medically screening exam initiated at 2:46 PM.  Appropriate orders placed.  Connie Herrera was informed that the remainder of the evaluation will be completed by another provider, this initial triage assessment does not replace that evaluation, and the importance of remaining in the ED until their evaluation is complete.     Lorin Glass, PA-C 09/26/20 1447    Quintella Reichert, MD 09/26/20 [phone]

## 2020-09-30 ENCOUNTER — Encounter: Payer: Self-pay | Admitting: Diagnostic Neuroimaging

## 2020-09-30 ENCOUNTER — Other Ambulatory Visit: Payer: Self-pay

## 2020-09-30 ENCOUNTER — Ambulatory Visit: Payer: 59 | Admitting: Diagnostic Neuroimaging

## 2020-09-30 ENCOUNTER — Telehealth: Payer: Self-pay

## 2020-09-30 VITALS — BP 155/105 | HR 105 | Ht 67.0 in | Wt 224.8 lb

## 2020-09-30 DIAGNOSIS — M79605 Pain in left leg: Secondary | ICD-10-CM | POA: Diagnosis not present

## 2020-09-30 DIAGNOSIS — M79604 Pain in right leg: Secondary | ICD-10-CM | POA: Diagnosis not present

## 2020-09-30 NOTE — Patient Instructions (Addendum)
  LEG MUSCLE PAIN / SPASM (elevated CK) - check MRI lumbar spine - check labs  - may consider repeat nerve testing (EMG / NCS was normal in 2015)   HYPERTENSION - follow up with PCP  ANXIETY / STRESS - consider therapist / counselor

## 2020-09-30 NOTE — Progress Notes (Signed)
[facility]  PATIENT: Connie Herrera DOB: 1975-08-22  REFERRING CLINICIAN: Jilda Panda, MD HISTORY FROM: patient  REASON FOR VISIT: follow up   HISTORICAL  CHIEF COMPLAINT:  Chief Complaint  Patient presents with   BLE pain, cramps    Rm 6 "multiple ED visits with painful muscle cramps, unable to walk, right arm was weak; if I don't take muscle relaxers my legs jump"    HISTORY OF PRESENT ILLNESS:   UPDATE (09/30/20, VRP): Since last visit, patient continues to have significant stress and anxiety.  She also had multiple emergency room visits for lower extremity pain, cramps, muscle spasms.  She had evaluation of MRI cervical and thoracic spine which were unremarkable.  Lab testing were unremarkable except recently CK was slightly elevated.  Patient having significant stress at home and becomes tearful when discussing this.  PRIOR HPI: 45 year old female here for evaluation of transient double vision, headaches, abnormal MRI.  04/14/2020 patient went to the emergency room due to several days of blurred vision and double vision. Patient noticed horizontal double vision which seem to be coming from her right eye according to the patient. If she closed 1 eye double vision would go away. Patient went to the hospital for evaluation. CT of the head was obtained which showed hyperdensity in the left lentiform nuclei. Follow-up MRI brain confirmed a small enhancing lesion, possibly a cavernous malformation, but slightly atypical. Patient was noted to have right lateral rectus palsy on exam in the emergency room. She also had some hypertension. She was having some body aches and malaise. She had Covid testing done which was positive. She was discharged home with follow-up.  Since that time patient has noted her blood pressure has been staying high. Her Covid infection symptoms have improved. Her double vision symptoms also improved over several days.  Today patient was  feeling some anxiety and stress related to this appointment. She has had some headache today.  Of note in 2013 patient had MRI of the brain which showed nonspecific T2 hyperintensities, and possibility of MS versus small vessel disease was raised. Apparently she enrolled in a clinical research study at that time and had lumbar puncture which was normal. And therefore MS was ruled out at that time.   REVIEW OF SYSTEMS: Full 14 system review of systems performed and negative with exception of: As per HPI.  ALLERGIES: Allergies  Allergen Reactions   Hydrocodone Nausea And Vomiting    Patient states that she can take medication it just makes her have bad nausea    HOME MEDICATIONS: Outpatient Medications Prior to Visit  Medication Sig Dispense Refill   amLODipine (NORVASC) 5 MG tablet Take 1 tablet (5 mg total) by mouth daily. 30 tablet 0   cyclobenzaprine (FLEXERIL) 10 MG tablet Take 1 tablet (10 mg total) by mouth 3 (three) times daily as needed for muscle spasms. 30 tablet 0   trimethoprim-polymyxin b (POLYTRIM) ophthalmic solution Apply 1-2 drops into affected eye QID x 5 days. 10 mL 0   acyclovir (ZOVIRAX) 200 MG capsule Take 2 capsules (400 mg total) by mouth 3 (three) times daily. Take for 5 days prn each outbreak. (Patient not taking: Reported on 09/30/2020) 30 capsule 11   diazepam (VALIUM) 2 MG tablet Take 1 tablet (2 mg total) by mouth every 6 (six) hours as needed for muscle spasms. (Patient not taking: Reported on 09/30/2020) 15 tablet 0   medroxyPROGESTERone (PROVERA) 10 MG tablet Take 10 mg by mouth daily. 09/30/20  hasn't started     methocarbamol (ROBAXIN) 500 MG tablet Take 1 tablet (500 mg total) by mouth 2 (two) times daily as needed for muscle spasms. (Patient not taking: Reported on 09/30/2020) 14 tablet 0   naproxen (NAPROSYN) 500 MG tablet Take 1 tablet (500 mg total) by mouth 2 (two) times daily with a meal. (Patient not taking: Reported on 09/30/2020) 20 tablet 0   potassium  chloride SA (KLOR-CON) 20 MEQ tablet Take 2 tablets (40 mEq total) by mouth 2 (two) times daily for 5 days. 20 tablet 0   quiNINE (QUALAQUIN) 324 MG capsule Take 324 mg by mouth at bedtime. 09/30/20 never started (Patient not taking: Reported on 09/30/2020)     potassium chloride (KLOR-CON 10) 10 MEQ tablet Take 1 tablet (10 mEq total) by mouth daily. 30 tablet 0   No facility-administered medications prior to visit.    PAST MEDICAL HISTORY: Past Medical History:  Diagnosis Date   Brain lesion    COVID-19 04/2020   Diplopia    Hypertension     PAST SURGICAL HISTORY: Past Surgical History:  Procedure Laterality Date   TUBAL LIGATION      FAMILY HISTORY: Family History  Problem Relation Age of Onset   Healthy Mother    Cirrhosis Father    Cancer Maternal Grandmother 42       breast   Breast cancer Maternal Grandmother     SOCIAL HISTORY: Social History   Socioeconomic History   Marital status: Single    Spouse name: Not on file   Number of children: 4   Years of education: 14   Highest education level: Some college, no degree  Occupational History    Employer: [employer]    Comment: chef  Tobacco Use   Smoking status: Former    Packs/day: 0.50    Years: 4.00    Pack years: 2.00    Types: Cigarettes    Quit date: 06/04/2017    Years since quitting: 3.3   Smokeless tobacco: Never  Vaping Use   Vaping Use: Never used  Substance and Sexual Activity   Alcohol use: No   Drug use: Yes    Types: Marijuana    Comment: 09/30/20 maybe once a week   Sexual activity: Never    Birth control/protection: Surgical  Other Topics Concern   Not on file  Social History Narrative   Patient lives at home with children.    Patient has 4 children.    Patient is single.    Patient has 2 years of college.    Patient is right handed.    Patient is working full time. [employer].    Social Determinants of Health   Financial Resource Strain: Not on file   Food Insecurity: No Food Insecurity   Worried About Charity fundraiser in the Last Year: Never true   Ran Out of Food in the Last Year: Never true  Transportation Needs: No Transportation Needs   Lack of Transportation (Medical): No   Lack of Transportation (Non-Medical): No  Physical Activity: Not on file  Stress: Not on file  Social Connections: Not on file  Intimate Partner Violence: Not on file     PHYSICAL EXAM  GENERAL EXAM/CONSTITUTIONAL: Vitals:  Vitals:   09/30/20 1351  BP: (!) 155/105  Pulse: (!) 105  Weight: 224 lb 12.8 oz (102 kg)  Height: 5' 7"  (1.702 m)   Body mass index is 35.21 kg/m. Wt Readings from  Last 3 Encounters:  09/30/20 224 lb 12.8 oz (102 kg)  09/10/20 240 lb 4.8 oz (109 kg)  08/13/20 218 lb (98.9 kg)   Patient is in no distress; well developed, nourished and groomed; neck is supple  CARDIOVASCULAR: Examination of carotid arteries is normal; no carotid bruits Regular rate and rhythm, no murmurs Examination of peripheral vascular system by observation and palpation is normal  EYES: Ophthalmoscopic exam of optic discs and posterior segments is normal; no papilledema or hemorrhages No results found.  MUSCULOSKELETAL: Gait, strength, tone, movements noted in Neurologic exam below  NEUROLOGIC: MENTAL STATUS:  No flowsheet data found. awake, alert, oriented to person, place and time recent and remote memory intact normal attention and concentration language fluent, comprehension intact, naming intact fund of knowledge appropriate  CRANIAL NERVE:  2nd - no papilledema on fundoscopic exam 2nd, 3rd, 4th, 6th - pupils equal and reactive to light, visual fields full to confrontation, extraocular muscles intact, no nystagmus 5th - facial sensation symmetric 7th - facial strength symmetric 8th - hearing intact 9th - palate elevates symmetrically, uvula midline 11th - shoulder shrug symmetric 12th - tongue protrusion midline  MOTOR:   normal bulk and tone, full strength in the BUE, BLE  SENSORY:  normal and symmetric to light touch, temperature, vibration  COORDINATION:  finger-nose-finger, fine finger movements normal  REFLEXES:  deep tendon reflexes present and symmetric  GAIT/STATION:  narrow based gait    DIAGNOSTIC DATA (LABS, IMAGING, TESTING) - I reviewed patient records, labs, notes, testing and imaging myself where available.  Lab Results  Component Value Date   WBC 7.5 09/26/2020   HGB 11.0 (L) 09/26/2020   HCT 35.4 (L) 09/26/2020   MCV 81.0 09/26/2020   PLT 468 (H) 09/26/2020      Component Value Date/Time   NA 138 09/26/2020 1447   NA 140 01/09/2020 1351   K 3.5 09/26/2020 1447   CL 105 09/26/2020 1447   CO2 27 09/26/2020 1447   GLUCOSE 110 (H) 09/26/2020 1447   BUN 12 09/26/2020 1447   BUN 9 01/09/2020 1351   CREATININE 1.06 (H) 09/26/2020 1447   CALCIUM 8.8 (L) 09/26/2020 1447   PROT 6.6 09/26/2020 1447   PROT 7.1 01/09/2020 1351   ALBUMIN 3.4 (L) 09/26/2020 1447   ALBUMIN 4.3 01/09/2020 1351   AST 15 09/26/2020 1447   ALT 17 09/26/2020 1447   ALKPHOS 66 09/26/2020 1447   BILITOT <0.1 (L) 09/26/2020 1447   BILITOT <0.2 01/09/2020 1351   GFRNONAA >60 09/26/2020 1447   GFRAA 96 01/09/2020 1351   Lab Results  Component Value Date   CHOL 138 03/07/2019   HDL 30 (L) 03/07/2019   LDLCALC 87 03/07/2019   TRIG 115 03/07/2019   Lab Results  Component Value Date   HGBA1C 5.7 (H) 03/07/2019   No results found for: VITAMINB12 Lab Results  Component Value Date   TSH 1.587 09/26/2020     01/09/2012 MRI brain without contrast - No acute infarction.  - Numerous foci of abnormal white matter signal within the cerebral  hemispheres.  This raises concern for demyelinating  disease/multiple sclerosis.  The differential diagnosis includes an  early manifestation small vessel disease, migraine related foci and  changes related old head trauma.    11/28/13 EMG / NCS - This is a  normal study. No electrodiagnostic evidence of large fiber neuropathy or myopathy at this time.  04/14/2020 MRI brain with and without [I reviewed images myself and agree with interpretation.  This is a new finding compared to 2013.  -VRP]  1. 9 mm well-circumscribed lesion involving the lateral margin of the left lentiform nucleus, corresponding with finding on prior CT. 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 However, the associated enhancement about this lesion is somewhat greater and atypical for what is usually seen with these lesions. Given this, a short interval follow-up MRI, with and without contrast, in 3 months is recommended to document stability. 2. No other acute intracranial abnormality. 3. Mild T2/FLAIR hyperintensity involving the supratentorial cerebral white matter, nonspecific, but most commonly related to chronic microvascular ischemic disease.  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  No other abnormalities are noted.  Overall no significant change compared with previous MRI dated 04/14/2020   ASSESSMENT AND PLAN  45 y.o. year old female here with 1 week of horizontal double vision in January 2022, with right lateral rectus palsy noted on exam in the emergency room, now resolved. Could represent microvascular scheming infarct to abducens nerve, related hypertension. We will proceed with further work-up with lab testing to evaluate for other etiologies. Also was found to have incidental left brain lesion, possible cavernous malformation, will follow up serial imaging. Also advised patient to establish with PCP and optimize medical risk factors, particularly hypertension.  Dx:  1. Pain in both lower extremities       PLAN:  LOWER EXT MUSCLE PAIN / MYALGIA (elevated CK) - check MRI lumbar spine - repeat CK,  aldolase - check neuropathy / myopathy labs - may consider repeat EMG / NCS (was normal in 2015)  TRANSIENT DOUBLE VISION (? right CN6 palsy per ER notes on 04/14/20) - resolved  LEFT BRAIN LESION (? cavernoma; likely incidental finding) - stable in April 2022  CHRONIC SMALL VESSEL ISCHEMIC DISEASE - likely related to high BP; optimize nutrition, exercise and BP control  HYPERTENSION - follow up with PCP  ANXIETY / STRESS - consider therapist / counselor  Orders Placed This Encounter  Procedures   MR LUMBAR SPINE WO CONTRAST   CK   Aldolase   Vitamin B12   SPEP with IFE   ANA w/Reflex   SSA, SSB   ESR   CRP   Angiotensin converting enzyme   Ambulatory referral to Physical Therapy   Return in about 3 months (around 12/31/2020).    Penni Bombard, MD 0/04/7492, 4:96 PM Certified in Neurology, Neurophysiology and Neuroimaging  [facility] [address] [place], [place] [address] [phone]

## 2020-09-30 NOTE — Telephone Encounter (Signed)
Referral sent to neuro rehab. P: [phone].

## 2020-10-02 ENCOUNTER — Telehealth: Payer: Self-pay | Admitting: Diagnostic Neuroimaging

## 2020-10-02 NOTE — Telephone Encounter (Signed)
10/02/20 [facility] Josem Kaufmann: [id number] (exp. 10/02/2020 - 10/31/2020) sent to GI

## 2020-10-03 ENCOUNTER — Telehealth: Payer: Self-pay | Admitting: Diagnostic Neuroimaging

## 2020-10-03 ENCOUNTER — Emergency Department (HOSPITAL_BASED_OUTPATIENT_CLINIC_OR_DEPARTMENT_OTHER): Payer: 59

## 2020-10-03 ENCOUNTER — Encounter (HOSPITAL_COMMUNITY): Payer: Self-pay

## 2020-10-03 ENCOUNTER — Emergency Department (HOSPITAL_BASED_OUTPATIENT_CLINIC_OR_DEPARTMENT_OTHER)
Admission: EM | Admit: 2020-10-03 | Discharge: 2020-10-04 | Disposition: A | Discharge status: Home / Self Care | Payer: 59 | Attending: Emergency Medicine | Admitting: Emergency Medicine

## 2020-10-03 ENCOUNTER — Other Ambulatory Visit: Payer: Self-pay

## 2020-10-03 ENCOUNTER — Encounter (HOSPITAL_BASED_OUTPATIENT_CLINIC_OR_DEPARTMENT_OTHER): Payer: Self-pay

## 2020-10-03 ENCOUNTER — Emergency Department (HOSPITAL_COMMUNITY)
Admission: EM | Admit: 2020-10-03 | Discharge: 2020-10-03 | Disposition: A | Discharge status: Home / Self Care | Payer: 59 | Attending: Emergency Medicine | Admitting: Emergency Medicine

## 2020-10-03 ENCOUNTER — Emergency Department (HOSPITAL_COMMUNITY): Payer: 59

## 2020-10-03 DIAGNOSIS — R531 Weakness: Secondary | ICD-10-CM | POA: Insufficient documentation

## 2020-10-03 DIAGNOSIS — R29898 Other symptoms and signs involving the musculoskeletal system: Secondary | ICD-10-CM

## 2020-10-03 DIAGNOSIS — M62838 Other muscle spasm: Secondary | ICD-10-CM

## 2020-10-03 DIAGNOSIS — Z87891 Personal history of nicotine dependence: Secondary | ICD-10-CM | POA: Insufficient documentation

## 2020-10-03 DIAGNOSIS — Z8616 Personal history of COVID-19: Secondary | ICD-10-CM | POA: Diagnosis not present

## 2020-10-03 DIAGNOSIS — G379 Demyelinating disease of central nervous system, unspecified: Secondary | ICD-10-CM | POA: Insufficient documentation

## 2020-10-03 DIAGNOSIS — I10 Essential (primary) hypertension: Secondary | ICD-10-CM | POA: Diagnosis not present

## 2020-10-03 DIAGNOSIS — M79661 Pain in right lower leg: Secondary | ICD-10-CM | POA: Insufficient documentation

## 2020-10-03 DIAGNOSIS — M791 Myalgia, unspecified site: Secondary | ICD-10-CM

## 2020-10-03 DIAGNOSIS — Z20822 Contact with and (suspected) exposure to covid-19: Secondary | ICD-10-CM | POA: Diagnosis not present

## 2020-10-03 DIAGNOSIS — M79662 Pain in left lower leg: Secondary | ICD-10-CM | POA: Insufficient documentation

## 2020-10-03 DIAGNOSIS — M545 Low back pain, unspecified: Secondary | ICD-10-CM | POA: Insufficient documentation

## 2020-10-03 DIAGNOSIS — Z79899 Other long term (current) drug therapy: Secondary | ICD-10-CM | POA: Insufficient documentation

## 2020-10-03 DIAGNOSIS — R2242 Localized swelling, mass and lump, left lower limb: Secondary | ICD-10-CM | POA: Insufficient documentation

## 2020-10-03 DIAGNOSIS — M62831 Muscle spasm of calf: Secondary | ICD-10-CM | POA: Diagnosis present

## 2020-10-03 LAB — BASIC METABOLIC PANEL
Anion gap: 10 (ref 5–15)
BUN: 12 mg/dL (ref 6–20)
CO2: 26 mmol/L (ref 22–32)
Calcium: 9.3 mg/dL (ref 8.9–10.3)
Chloride: 101 mmol/L (ref 98–111)
Creatinine, Ser: 0.89 mg/dL (ref 0.44–1.00)
GFR, Estimated: >60 mL/min (ref >60)
Glucose, Bld: 110 mg/dL — ABNORMAL HIGH (ref 70–99)
Potassium: 3.2 mmol/L — ABNORMAL LOW (ref 3.5–5.1)
Sodium: 137 mmol/L (ref 135–145)

## 2020-10-03 LAB — CBC WITH DIFFERENTIAL/PLATELET
Abs Immature Granulocytes: 0.03 10*3/uL (ref 0.00–0.07)
Basophils Absolute: 0 10*3/uL (ref 0.0–0.1)
Basophils Relative: 0 %
Eosinophils Absolute: 0.1 10*3/uL (ref 0.0–0.5)
Eosinophils Relative: 2 %
HCT: 37.5 % (ref 36.0–46.0)
Hemoglobin: 11.8 g/dL — ABNORMAL LOW (ref 12.0–15.0)
Immature Granulocytes: 0 %
Lymphocytes Relative: 21 %
Lymphs Abs: 1.6 10*3/uL (ref 0.7–4.0)
MCH: 25.1 pg — ABNORMAL LOW (ref 26.0–34.0)
MCHC: 31.5 g/dL (ref 30.0–36.0)
MCV: 79.6 fL — ABNORMAL LOW (ref 80.0–100.0)
Monocytes Absolute: 0.5 10*3/uL (ref 0.1–1.0)
Monocytes Relative: 6 %
Neutro Abs: 5.4 10*3/uL (ref 1.7–7.7)
Neutrophils Relative %: 71 %
Platelets: 514 10*3/uL — ABNORMAL HIGH (ref 150–400)
RBC: 4.71 MIL/uL (ref 3.87–5.11)
RDW: 16.6 % — ABNORMAL HIGH (ref 11.5–15.5)
WBC: 7.5 10*3/uL (ref 4.0–10.5)
nRBC: 0 % (ref 0.0–0.2)

## 2020-10-03 LAB — MULTIPLE MYELOMA PANEL, SERUM
Albumin SerPl Elph-Mcnc: 3.7 g/dL (ref 2.9–4.4)
Albumin/Glob SerPl: 1.2 (ref 0.7–1.7)
Alpha 1: 0.2 g/dL (ref 0.0–0.4)
Alpha2 Glob SerPl Elph-Mcnc: 0.7 g/dL (ref 0.4–1.0)
B-Globulin SerPl Elph-Mcnc: 1.2 g/dL (ref 0.7–1.3)
Gamma Glob SerPl Elph-Mcnc: 1.2 g/dL (ref 0.4–1.8)
Globulin, Total: 3.3 g/dL (ref 2.2–3.9)
IgA/Immunoglobulin A, Serum: 130 mg/dL (ref 87–352)
IgG (Immunoglobin G), Serum: 1358 mg/dL (ref 586–1602)
IgM (Immunoglobulin M), Srm: 48 mg/dL (ref 26–217)
Total Protein: 7 g/dL (ref 6.0–8.5)

## 2020-10-03 LAB — VITAMIN B12: Vitamin B-12: 397 pg/mL (ref 232–1245)

## 2020-10-03 LAB — MAGNESIUM: Magnesium: 2.5 mg/dL — ABNORMAL HIGH (ref 1.7–2.4)

## 2020-10-03 LAB — RESP PANEL BY RT-PCR (FLU A&B, COVID) ARPGX2
Influenza A by PCR: NEGATIVE
Influenza B by PCR: NEGATIVE
SARS Coronavirus 2 by RT PCR: NEGATIVE

## 2020-10-03 LAB — SJOGREN'S SYNDROME ANTIBODS(SSA + SSB)
ENA SSA (RO) Ab: <0.2 AI (ref 0.0–0.9)
ENA SSB (LA) Ab: <0.2 AI (ref 0.0–0.9)

## 2020-10-03 LAB — ALDOLASE: Aldolase: 5 U/L (ref 3.3–10.3)

## 2020-10-03 LAB — SEDIMENTATION RATE: Sed Rate: 11 mm/hr (ref 0–32)

## 2020-10-03 LAB — CK
Total CK: 181 U/L (ref 32–182)
Total CK: 306 U/L — ABNORMAL HIGH (ref 38–234)

## 2020-10-03 LAB — ANGIOTENSIN CONVERTING ENZYME: Angio Convert Enzyme: 64 U/L (ref 14–82)

## 2020-10-03 LAB — C-REACTIVE PROTEIN: CRP: 10 mg/L (ref 0–10)

## 2020-10-03 MED ORDER — GADOBUTROL 1 MMOL/ML IV SOLN
10.0000 mL | Freq: Once | INTRAVENOUS | Status: AC | PRN
  Administered 2020-10-03: 10 mL via INTRAVENOUS

## 2020-10-03 MED ORDER — METHOCARBAMOL 1000 MG/10ML IJ SOLN
1000.0000 mg | Freq: Once | INTRAVENOUS | Status: AC
  Administered 2020-10-03: 1000 mg via INTRAVENOUS
  Filled 2020-10-03: qty 1000

## 2020-10-03 MED ORDER — POTASSIUM CHLORIDE CRYS ER 20 MEQ PO TBCR
40.0000 meq | EXTENDED_RELEASE_TABLET | Freq: Once | ORAL | Status: AC
  Administered 2020-10-03: 40 meq via ORAL
  Filled 2020-10-03: qty 2

## 2020-10-03 MED ORDER — OXYCODONE-ACETAMINOPHEN 5-325 MG PO TABS
2.0000 | ORAL_TABLET | Freq: Once | ORAL | Status: AC
  Administered 2020-10-03: 2 via ORAL
  Filled 2020-10-03: qty 2

## 2020-10-03 MED ORDER — METHOCARBAMOL 500 MG PO TABS: 500.0000 mg | ORAL_TABLET | Freq: Two times a day (BID) | ORAL | 0 refills | Status: DC

## 2020-10-03 MED ORDER — HYDROCODONE-ACETAMINOPHEN 5-325 MG PO TABS: 1.0000 | ORAL_TABLET | Freq: Four times a day (QID) | ORAL | 0 refills | Status: DC | PRN

## 2020-10-03 MED ORDER — HYDROMORPHONE HCL 1 MG/ML IJ SOLN
1.0000 mg | Freq: Once | INTRAMUSCULAR | Status: AC
  Administered 2020-10-03: 1 mg via INTRAVENOUS
  Filled 2020-10-03: qty 1

## 2020-10-03 NOTE — Progress Notes (Signed)
Called to admit patient for left leg weakness presumed to be secondary to a demyelinating lesion.  Informed by PA that she had received a call from the neurologist who told her that the finding on her MRI was most likely an artifact and not a demyelinating lesion. Patient will not need admission and transfer to [facility] at this time.

## 2020-10-03 NOTE — Telephone Encounter (Signed)
Called patient and advised she go to ED to be evaluated.  She stated she saw her labs results, asked if they were ok. I advised one is pending, others are okay. She stated something is wrong with my muscles; no one is checking my muscles.  I advised the ED can do emergent MRI lumbar spine Dr Leta Baptist ordered and any other tests MD feels needed. She is at work, stated she will go now to ED. Will inform Dr Leta Baptist.

## 2020-10-03 NOTE — ED Provider Notes (Addendum)
[facility] DEPT Provider Note   CSN: [id number] Arrival date & time: 10/03/20  1127     History Chief Complaint  Patient presents with   Leg Muscle Spasms    Connie Herrera is a 45 y.o. female.  Connie Herrera is a 45 y.o. female with a history of COVID, hypertension, uterine fibroids, anemia, who presents to the emergency department for evaluation of severe spasms and pain in both legs.  Symptoms have been ongoing for the past 2 months and patient has been seen in the ED multiple times as well as by her PCP and Dr. Leta Baptist with neurology.  Saw Dr. Leta Baptist most recently 3 days ago, they did lab work for myopathy and neuropathy and were planning to schedule patient for lumbar MRI she has already had MRI of the brain, cervical and thoracic spine that have been unremarkable.  She comes in today due to significantly worsening pain and spasms now making her legs weak.  She reports she has been stumbling, has not fallen but reports her legs feel like they have gotten weaker.  No numbness.  No loss of bowel or bladder control or saddle anesthesia.  No associated abdominal pain.  No similar spasms, pain or numbness in the upper extremities.  No associated headache or vision changes.  No other aggravating or alleviating factors.  The history is provided by the patient and medical records.      Past Medical History:  Diagnosis Date   Brain lesion    COVID-19 04/2020   Diplopia    Hypertension     Patient Active Problem List   Diagnosis Date Noted   Muscle spasms of both lower extremities 08/14/2020   Fibroid uterus 08/14/2020   Anemia due to blood loss, chronic 01/10/2020   Menorrhagia with regular cycle 01/09/2020   Class 1 obesity due to excess calories with body mass index (BMI) of 34.0 to 34.9 in adult 01/09/2020   Screening breast examination 02/07/2019   Breast pain 02/07/2019   Hypertension 01/26/2019   Paresthesias 08/04/2013    Breast pain, right 03/11/2012    Past Surgical History:  Procedure Laterality Date   TUBAL LIGATION       OB History     Gravida  4   Para  4   Term  4   Preterm  0   AB  0   Living  4      SAB  0   IAB  0   Ectopic  0   Multiple  0   Live Births  4           Family History  Problem Relation Age of Onset   Healthy Mother    Cirrhosis Father    Cancer Maternal Grandmother 39       breast   Breast cancer Maternal Grandmother     Social History   Tobacco Use   Smoking status: Former    Packs/day: 0.50    Years: 4.00    Pack years: 2.00    Types: Cigarettes    Quit date: 06/04/2017    Years since quitting: 3.3   Smokeless tobacco: Never  Vaping Use   Vaping Use: Never used  Substance Use Topics   Alcohol use: No   Drug use: Yes    Types: Marijuana    Comment: 09/30/20 maybe once a week    Home Medications Prior to Admission medications   Medication Sig Start Date End Date Taking?  Authorizing Provider  acyclovir (ZOVIRAX) 200 MG capsule Take 2 capsules (400 mg total) by mouth 3 (three) times daily. Take for 5 days prn each outbreak. Patient not taking: Reported on 09/30/2020 03/18/20   Shelly Bombard, MD  amLODipine (NORVASC) 5 MG tablet Take 1 tablet (5 mg total) by mouth daily. 08/13/20   Lajean Saver, MD  cyclobenzaprine (FLEXERIL) 10 MG tablet Take 1 tablet (10 mg total) by mouth 3 (three) times daily as needed for muscle spasms. 08/11/20   Raspet, Derry Skill, PA-C  diazepam (VALIUM) 2 MG tablet Take 1 tablet (2 mg total) by mouth every 6 (six) hours as needed for muscle spasms. Patient not taking: Reported on 09/30/2020 09/10/20   Lacretia Leigh, MD  medroxyPROGESTERone (PROVERA) 10 MG tablet Take 10 mg by mouth daily. 09/30/20 hasn't started 09/18/20   [provider]  potassium chloride SA (KLOR-CON) 20 MEQ tablet Take 2 tablets (40 mEq total) by mouth 2 (two) times daily for 5 days. 08/14/20 08/19/20  Deno Etienne, DO  trimethoprim-polymyxin  b (POLYTRIM) ophthalmic solution Apply 1-2 drops into affected eye QID x 5 days. 09/25/20   Brunetta Jeans, PA-C  hydrochlorothiazide (HYDRODIURIL) 25 MG tablet Take 1 tablet (25 mg total) by mouth daily. Must have office visit for refills 06/20/20 08/13/20  Brunetta Jeans, PA-C    Allergies    Patient has no known allergies.  Review of Systems   Review of Systems  Constitutional:  Negative for chills and fever.  HENT: Negative.    Gastrointestinal:  Negative for abdominal pain.  Musculoskeletal:  Positive for myalgias. Negative for arthralgias and back pain.  Skin:  Negative for color change and rash.  Neurological:  Positive for weakness. Negative for numbness.  All other systems reviewed and are negative.  Physical Exam Updated Vital Signs BP (!) 176/130 (BP Location: Right Arm)   Pulse 98   Temp 97.8 F (36.6 C) (Oral)   Resp 18   SpO2 100%   Physical Exam Vitals and nursing note reviewed.  Constitutional:      General: She is not in acute distress.    Appearance: Normal appearance. She is well-developed. She is obese. She is not diaphoretic.     Comments: Pt appears uncomfortable and anxious  HENT:     Head: Normocephalic and atraumatic.  Eyes:     General:        Right eye: No discharge.        Left eye: No discharge.     Pupils: Pupils are equal, round, and reactive to light.  Cardiovascular:     Rate and Rhythm: Normal rate and regular rhythm.     Pulses: Normal pulses.     Heart sounds: Normal heart sounds.  Pulmonary:     Effort: Pulmonary effort is normal.     Breath sounds: Normal breath sounds.     Comments: Respirations equal and unlabored, patient able to speak in full sentences, lungs clear to auscultation bilaterally  Abdominal:     General: There is no distension.     Palpations: Abdomen is soft.     Tenderness: There is no abdominal tenderness.     Comments: Abdomen soft, nondistended, nontender to palpation in all quadrants without guarding or  peritoneal signs  Musculoskeletal:        General: No deformity.     Cervical back: Neck supple.     Comments: No lower extremity swelling or deformity noted, range of motion limited due to pain  Skin:    General: Skin is warm and dry.     Capillary Refill: Capillary refill takes less than 2 seconds.  Neurological:     Mental Status: She is alert and oriented to person, place, and time.     Coordination: Coordination normal.     Comments: Speech is clear, able to follow commands CN III-XII intact Normal strength in upper  extremities bilaterally, strength in the left lower extremity decreased when compared to right. Sensation normal to light and sharp touch Moves extremities without ataxia, coordination intact  Psychiatric:        Mood and Affect: Mood normal.        Behavior: Behavior normal.    ED Results / Procedures / Treatments   Labs (all labs ordered are listed, but only abnormal results are displayed) Labs Reviewed  BASIC METABOLIC PANEL - Abnormal; Notable for the following components:      Result Value   Potassium 3.2 (*)    Glucose, Bld 110 (*)    All other components within normal limits  CBC WITH DIFFERENTIAL/PLATELET - Abnormal; Notable for the following components:   Hemoglobin 11.8 (*)    MCV 79.6 (*)    MCH 25.1 (*)    RDW 16.6 (*)    Platelets 514 (*)    All other components within normal limits  MAGNESIUM - Abnormal; Notable for the following components:   Magnesium 2.5 (*)    All other components within normal limits  CK - Abnormal; Notable for the following components:   Total CK 306 (*)    All other components within normal limits  RESP PANEL BY RT-PCR (FLU A&B, COVID) ARPGX2    EKG None  Radiology MR Lumbar Spine W Wo Contrast  Addendum Date: 10/03/2020   ADDENDUM REPORT: 10/03/2020 15:03 ADDENDUM: Dr. Rory Percy called to discuss this case. To my review, including review of the thoracic study performed 08/13/2020, I do not believe that there is a  cord lesion demonstrated in the distal thoracic cord region. I believe that area is normal. Electronically Signed   By: Nelson Chimes M.D.   On: 10/03/2020 15:03   Result Date: 10/03/2020 CLINICAL DATA:  History of multiple sclerosis. Increasing low back pain and bilateral lower extremity weakness. EXAM: MRI LUMBAR SPINE WITHOUT AND WITH CONTRAST TECHNIQUE: Multiplanar and multiecho pulse sequences of the lumbar spine were obtained without and with intravenous contrast. CONTRAST:  10 mL GADAVIST IV SOLN COMPARISON:  Thoracic spine MRI 08/13/2020. FINDINGS: Segmentation:  Standard. Alignment:  Normal. Vertebrae:  No fracture, evidence of discitis, or bone lesion. Conus medullaris and cauda equina: Conus extends to the L1-2 level. Conus and cauda equina appear normal. There is a small focus of signal abnormality in the left hemicord just superior to the T12 level on image 4 of series 7. This appears to be present on the prior study but is difficult to visualize. No abnormal enhancement after contrast administration. Paraspinal and other soft tissues: Degenerative change about the SI joints is worse on the right. Bulky fibroid uterus noted. Imaged intra-abdominal contents are negative. Disc levels: The central canal and foramina are open at all levels. There is a shallow disc bulge at L5-S1. IMPRESSION: Findings compatible with a small demyelinating focus in the left hemicord at the level of the T12 inferior endplate. This is likely present on the prior thoracic spine MRI but it is difficult to visualize. No abnormal enhancement after contrast administration. Shallow disc bulge L5-S1 without stenosis. Fibroid uterus. Electronically  Signed: By: Inge Rise M.D. On: 10/03/2020 14:23    Procedures Procedures   Medications Ordered in ED Medications  methocarbamol (ROBAXIN) 1,000 mg in dextrose 5 % 100 mL IVPB (1,000 mg Intravenous New Bag/Given 10/03/20 1416)  HYDROmorphone (DILAUDID) injection 1 mg (1 mg  Intravenous Given 10/03/20 1235)  potassium chloride SA (KLOR-CON) CR tablet 40 mEq (40 mEq Oral Given 10/03/20 1416)  gadobutrol (GADAVIST) 1 MMOL/ML injection 10 mL (10 mLs Intravenous Contrast Given 10/03/20 1401)    ED Course  I have reviewed the triage vital signs and the nursing notes.  Pertinent labs & imaging results that were available during my care of the patient were reviewed by me and considered in my medical decision making (see chart for details).    MDM Rules/Calculators/A&P                         45 year old female presents with persistent and worsening spasms in bilateral lower extremities now associated with some weakness, no numbness.  No injury or trauma.  Has been seen by neurology and they were going to schedule her for an outpatient lumbar spine MRI, has already had MRIs of the brain, cervical and thoracic spine.  Pain worsened today and she felt like she was having trouble walking due to pain and weakness.  Will check basic labs, CK and proceed with lumbar spine MRI today.  Pain medication and high spasmodics given for symptomatic management.  I have independently ordered, reviewed and interpreted all labs and imaging: CBC: No leukocytosis, stable hemoglobin BMP: Mild hypokalemia of 3.2, p.o. potassium replacement given, glucose 110, no other electrolyte derangements, normal renal function Magnesium: Mildly elevated to 2.5 CK: Minimal elevation at 306, consistent with recent lab work done.  Lumbar spine MRI with a small demyelinating focus in the left hemicord at the level of the T12 inferior endplate, this was likely present on previous thoracic MRI but difficult to visualize.  Demyelinating lesion concerning for potential MS, will consult neurology.  Case discussed with Dr. Cheral Marker, patient will need to be admitted with immediate neurology consult, he would like to hold off on initiating steroid treatment until she has been seen and examined by neurology.  Will  need to be [facility] at [facility].  2:53 PM Case discussed with Dr. Francine Graven with [facility] hospitalist who will see and admit patient.  3:15 PM received call from Dr. Rory Percy with neurology, he reviewed patient's MRI images with Dr. Maree Erie with neuroradiology and they did not feel that there is a demyelinating lesion present as a small lesion that is more likely artifact.  Without demyelinating lesion neurology does not feel that the patient will need inpatient admission because there is no indication for starting high-dose steroids.  They recommend continued outpatient follow-up with her neurologist.  I discussed this with the patient and discussed radiologist review of her images with neuroradiologist who does not see any signs of demyelinating lesion to suggest MS, therefore neurology no longer recommends hospital admission, patient expresses understanding.  She is frustrated that she still does not have an answer for her continued symptoms.  Stressed the importance of continuing to follow-up with her outpatient primary care doctor and neurologist.  Will discharge with muscle relaxer and very short course of pain medication.  Discussed plan for outpatient follow-up and return precautions.  Patient expresses understanding and agreement.  Discharged home in good condition.   Final Clinical Impression(s) / ED Diagnoses Final diagnoses:  Demyelinating  lesion [facility])  Left leg weakness  Muscle spasms of both lower extremities    Rx / DC Orders ED Discharge Orders          Ordered    methocarbamol (ROBAXIN) 500 MG tablet  2 times daily        10/03/20 1524    HYDROcodone-acetaminophen (NORCO) 5-325 MG tablet  Every 6 hours PRN        10/03/20 1524             Jacqlyn Larsen, PA-C 10/03/20 1501    Jacqlyn Larsen, PA-C 10/03/20 1525    Isla Pence, MD 10/04/20 [phone]

## 2020-10-03 NOTE — ED Notes (Signed)
An After Visit Summary was printed and given to the patient. Discharge instructions given and no further questions at this time.  

## 2020-10-03 NOTE — ED Provider Notes (Signed)
[place] EMERGENCY DEPT Provider Note   CSN: [id number] Arrival date & time: 10/03/20  2110     History Chief Complaint  Patient presents with   Spasms    Connie Herrera is a 45 y.o. female.  Connie Herrera has had progressive, severe cramping in her lower extremities 2 months.  Today the cramping was lasting longer than usual, and she presented to [facility].  She was evaluated fully with labs as well as an MRI.  There was concern for a possible demyelinating lesion.  She has been told in the past that she might have multiple sclerosis, and she has had an MRI of her brain as recently as January of this year that was suspicious for the same.  However, after neurology reviewed the images, they did not think that she had evidence of a demyelinating disease.  However, she presented again to the hospital after going home.  The pain was severe and was unrelieved by muscle relaxers.  Her functional status has declined greatly, as she finds it very difficult to get out of chairs and climb stairs.  The history is provided by the patient.  Leg Pain Lower extremity pain location: bilateral legs in their entirety. Pain details:    Quality:  Cramping ("like labor pains")   Radiates to:  Does not radiate   Severity:  Severe   Onset quality:  Gradual   Duration:  2 months   Timing:  Intermittent   Progression:  Worsening Chronicity:  Recurrent Prior injury to area:  No Relieved by:  Nothing Worsened by:  Bearing weight (stairs, walking) Associated symptoms: back pain (low back), decreased ROM, muscle weakness and swelling (left knee)   Associated symptoms: no fever, no numbness and no tingling       Past Medical History:  Diagnosis Date   Brain lesion    COVID-19 04/2020   Diplopia    Hypertension     Patient Active Problem List   Diagnosis Date Noted   Muscle spasms of both lower extremities 08/14/2020   Fibroid uterus 08/14/2020   Anemia due to  blood loss, chronic 01/10/2020   Menorrhagia with regular cycle 01/09/2020   Class 1 obesity due to excess calories with body mass index (BMI) of 34.0 to 34.9 in adult 01/09/2020   Screening breast examination 02/07/2019   Breast pain 02/07/2019   Hypertension 01/26/2019   Paresthesias 08/04/2013   Breast pain, right 03/11/2012    Past Surgical History:  Procedure Laterality Date   TUBAL LIGATION       OB History     Gravida  4   Para  4   Term  4   Preterm  0   AB  0   Living  4      SAB  0   IAB  0   Ectopic  0   Multiple  0   Live Births  4           Family History  Problem Relation Age of Onset   Healthy Mother    Cirrhosis Father    Cancer Maternal Grandmother 45       breast   Breast cancer Maternal Grandmother     Social History   Tobacco Use   Smoking status: Former    Packs/day: 0.50    Years: 4.00    Pack years: 2.00    Types: Cigarettes    Quit date: 06/04/2017    Years since quitting: 3.3  Smokeless tobacco: Never  Vaping Use   Vaping Use: Never used  Substance Use Topics   Alcohol use: No   Drug use: Yes    Types: Marijuana    Comment: 09/30/20 maybe once a week    Home Medications Prior to Admission medications   Medication Sig Start Date End Date Taking? Authorizing Provider  acyclovir (ZOVIRAX) 200 MG capsule Take 2 capsules (400 mg total) by mouth 3 (three) times daily. Take for 5 days prn each outbreak. Patient not taking: Reported on 09/30/2020 03/18/20   Shelly Bombard, MD  amLODipine (NORVASC) 5 MG tablet Take 1 tablet (5 mg total) by mouth daily. 08/13/20   Lajean Saver, MD  HYDROcodone-acetaminophen (NORCO) 5-325 MG tablet Take 1 tablet by mouth every 6 (six) hours as needed. 10/03/20   Jacqlyn Larsen, PA-C  medroxyPROGESTERone (PROVERA) 10 MG tablet Take 10 mg by mouth daily. 09/30/20 hasn't started 09/18/20   [provider]  methocarbamol (ROBAXIN) 500 MG tablet Take 1 tablet (500 mg total) by mouth 2  (two) times daily. 10/03/20   Jacqlyn Larsen, PA-C  potassium chloride SA (KLOR-CON) 20 MEQ tablet Take 2 tablets (40 mEq total) by mouth 2 (two) times daily for 5 days. 08/14/20 08/19/20  Deno Etienne, DO  trimethoprim-polymyxin b (POLYTRIM) ophthalmic solution Apply 1-2 drops into affected eye QID x 5 days. 09/25/20   Brunetta Jeans, PA-C  hydrochlorothiazide (HYDRODIURIL) 25 MG tablet Take 1 tablet (25 mg total) by mouth daily. Must have office visit for refills 06/20/20 08/13/20  Brunetta Jeans, PA-C    Allergies    Patient has no known allergies.  Review of Systems   Review of Systems  Constitutional:  Negative for chills and fever.  HENT:  Negative for ear pain and sore throat.   Eyes:  Negative for pain and visual disturbance.  Respiratory:  Negative for cough and shortness of breath.   Cardiovascular:  Negative for chest pain and palpitations.  Gastrointestinal:  Negative for abdominal pain and vomiting.  Genitourinary:  Negative for dysuria and hematuria.  Musculoskeletal:  Positive for back pain (low back), gait problem, joint swelling and myalgias. Negative for arthralgias.  Skin:  Negative for color change and rash.  Neurological:  Negative for seizures and syncope.  All other systems reviewed and are negative.  Physical Exam Updated Vital Signs BP (!) 168/97   Pulse 82   Temp 98.7 F (37.1 C)   Resp 19   Ht 5\' 7"  (1.702 m)   Wt 101.6 kg   LMP 10/02/2020 Comment: No chance per pt  SpO2 97%   BMI 35.08 kg/m   Physical Exam Vitals and nursing note reviewed.  HENT:     Head: Normocephalic and atraumatic.  Eyes:     General: No scleral icterus. Pulmonary:     Effort: Pulmonary effort is normal. No respiratory distress.  Musculoskeletal:     Cervical back: Normal range of motion.     Comments: Legs are normal to inspection.  Mild atrophy of both calf muscles.  They are tender to palpation diffusely, and she is hypersensitive to even light touch.  Dorsalis pedis and  posterior tibialis pulses are palpable.  There is slight swelling at the inferior patella bursa on the left.  Otherwise, there are no other joint effusions.  Range of motion of the ankles, knees, and hips are within normal limits.  She is able to stand.  She cannot perform heel or toe raises.  She is  unsteady on her feet from what appears to be weakness rather than ataxia.  Skin:    General: Skin is warm and dry.     Capillary Refill: Capillary refill takes less than 2 seconds.  Neurological:     Mental Status: She is alert.     Sensory: No sensory deficit.     Deep Tendon Reflexes: Reflexes normal.     Comments: Hip flexor weakness bilaterally  Psychiatric:        Mood and Affect: Mood normal.    ED Results / Procedures / Treatments   Labs (all labs ordered are listed, but only abnormal results are displayed) Labs Reviewed - No data to display  EKG None  Radiology MR Lumbar Spine W Wo Contrast  Addendum Date: 10/03/2020   ADDENDUM REPORT: 10/03/2020 15:03 ADDENDUM: Dr. Rory Percy called to discuss this case. To my review, including review of the thoracic study performed 08/13/2020, I do not believe that there is a cord lesion demonstrated in the distal thoracic cord region. I believe that area is normal. Electronically Signed   By: Nelson Chimes M.D.   On: 10/03/2020 15:03   Result Date: 10/03/2020 CLINICAL DATA:  History of multiple sclerosis. Increasing low back pain and bilateral lower extremity weakness. EXAM: MRI LUMBAR SPINE WITHOUT AND WITH CONTRAST TECHNIQUE: Multiplanar and multiecho pulse sequences of the lumbar spine were obtained without and with intravenous contrast. CONTRAST:  10 mL GADAVIST IV SOLN COMPARISON:  Thoracic spine MRI 08/13/2020. FINDINGS: Segmentation:  Standard. Alignment:  Normal. Vertebrae:  No fracture, evidence of discitis, or bone lesion. Conus medullaris and cauda equina: Conus extends to the L1-2 level. Conus and cauda equina appear normal. There is a small  focus of signal abnormality in the left hemicord just superior to the T12 level on image 4 of series 7. This appears to be present on the prior study but is difficult to visualize. No abnormal enhancement after contrast administration. Paraspinal and other soft tissues: Degenerative change about the SI joints is worse on the right. Bulky fibroid uterus noted. Imaged intra-abdominal contents are negative. Disc levels: The central canal and foramina are open at all levels. There is a shallow disc bulge at L5-S1. IMPRESSION: Findings compatible with a small demyelinating focus in the left hemicord at the level of the T12 inferior endplate. This is likely present on the prior thoracic spine MRI but it is difficult to visualize. No abnormal enhancement after contrast administration. Shallow disc bulge L5-S1 without stenosis. Fibroid uterus. Electronically Signed: By: Inge Rise M.D. On: 10/03/2020 14:23   US Venous Img Lower Bilateral  Result Date: 10/03/2020 CLINICAL DATA:  Bilateral leg spasms. EXAM: BILATERAL LOWER EXTREMITY VENOUS DOPPLER ULTRASOUND TECHNIQUE: Gray-scale sonography with graded compression, as well as color Doppler and duplex ultrasound were performed to evaluate the lower extremity deep venous systems from the level of the common femoral vein and including the common femoral, femoral, profunda femoral, popliteal and calf veins including the posterior tibial, peroneal and gastrocnemius veins when visible. The superficial great saphenous vein was also interrogated. Spectral Doppler was utilized to evaluate flow at rest and with distal augmentation maneuvers in the common femoral, femoral and popliteal veins. COMPARISON:  None. FINDINGS: RIGHT LOWER EXTREMITY Common Femoral Vein: No evidence of thrombus. Normal compressibility, respiratory phasicity and response to augmentation. Saphenofemoral Junction: No evidence of thrombus. Normal compressibility and flow on color Doppler imaging. Profunda  Femoral Vein: No evidence of thrombus. Normal compressibility and flow on color Doppler imaging. Femoral Vein:  No evidence of thrombus. Normal compressibility, respiratory phasicity and response to augmentation. Popliteal Vein: No evidence of thrombus. Normal compressibility, respiratory phasicity and response to augmentation. Calf Veins: No evidence of thrombus. Normal compressibility and flow on color Doppler imaging. Superficial Great Saphenous Vein: No evidence of thrombus. Normal compressibility. Venous Reflux:  None. Other Findings: The patient was unable to tolerate compressions on the thighs and behind the knee, as per the ultrasound technologist. LEFT LOWER EXTREMITY Common Femoral Vein: No evidence of thrombus. Normal compressibility, respiratory phasicity and response to augmentation. Saphenofemoral Junction: No evidence of thrombus. Normal compressibility and flow on color Doppler imaging. Profunda Femoral Vein: No evidence of thrombus. Normal compressibility and flow on color Doppler imaging. Femoral Vein: No evidence of thrombus. Normal compressibility, respiratory phasicity and response to augmentation. Popliteal Vein: No evidence of thrombus. Normal compressibility, respiratory phasicity and response to augmentation. Calf Veins: No evidence of thrombus. Normal compressibility and flow on color Doppler imaging. Superficial Great Saphenous Vein: No evidence of thrombus. Normal compressibility. Venous Reflux:  None. Other Findings: The patient was unable to tolerate compressions on the thighs and behind the knee, as per the ultrasound technologist. IMPRESSION: No evidence of deep venous thrombosis in either lower extremity. Electronically Signed   By: Virgina Norfolk M.D.   On: 10/03/2020 23:10   DG Knee Complete 4 Views Left  Result Date: 10/03/2020 CLINICAL DATA:  Leg pain EXAM: LEFT KNEE - COMPLETE 4+ VIEW COMPARISON:  01/11/2012 FINDINGS: No evidence of fracture, dislocation, or joint  effusion. No evidence of arthropathy or other focal bone abnormality. Soft tissues are unremarkable. IMPRESSION: Negative. Electronically Signed   By: Donavan Foil M.D.   On: 10/03/2020 22:40   DG Knee Complete 4 Views Right  Result Date: 10/03/2020 CLINICAL DATA:  Leg pain EXAM: RIGHT KNEE - COMPLETE 4+ VIEW COMPARISON:  None. FINDINGS: No evidence of fracture, dislocation, or joint effusion. No evidence of arthropathy or other focal bone abnormality. Soft tissues are unremarkable. IMPRESSION: Negative. Electronically Signed   By: Donavan Foil M.D.   On: 10/03/2020 22:41   DG Hips Bilat W or Wo Pelvis 2 Views  Result Date: 10/03/2020 CLINICAL DATA:  Bilateral leg pain EXAM: DG HIP (WITH OR WITHOUT PELVIS) 2V BILAT COMPARISON:  None. FINDINGS: No fracture or malalignment. There are mild degenerative changes. SI joints are patent. Pubic symphysis is normal IMPRESSION: Mild degenerative changes Electronically Signed   By: Donavan Foil M.D.   On: 10/03/2020 22:40    Procedures Procedures   Medications Ordered in ED Medications  oxyCODONE-acetaminophen (PERCOCET/ROXICET) 5-325 MG per tablet 2 tablet (has no administration in time range)    ED Course  I have reviewed the triage vital signs and the nursing notes.  Pertinent labs & imaging results that were available during my care of the patient were reviewed by me and considered in my medical decision making (see chart for details).    MDM Rules/Calculators/A&P                          Connie Herrera presents for her second ED visit today with chronic, severe lower extremity muscular cramping.  She has had a very thorough laboratory work-up including extensive testing for rheumatologic conditions.  She had an MRI of the lumbar spine today.  She is established with neurology and has been referred to neurology physical therapy.  I did not repeat blood work today as she had had earlier.  I did  add plain films of her hips and knees.  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  This could certainly be contributing to some of her problems.  No evidence of DVT.  No evidence of a vascular problem.  I do think a demyelinating disease may still be on the differential, but it certainly has been somewhat elusive based on her spectrum of imaging studies.  I referred her to transitions of care for consideration for some home services.  She was prescribed hydrocodone earlier today.  She will be discharged home with recommendations to closely follow with her specialty services. Final Clinical Impression(s) / ED Diagnoses Final diagnoses:  Myalgia  Weakness of both lower extremities    Rx / DC Orders ED Discharge Orders     None        Arnaldo Natal, MD 10/03/20 2354

## 2020-10-03 NOTE — Progress Notes (Signed)
Called by ED provider at [facility] regarding concerning finding of a lesion in the lower thoracic spine. Reviewed with neuroradiology for second look-likely an artifact and not a demyelinating lesion. She is established patient of Dr. Leta Baptist, and I would recommend following back with him. She can in the interim be given symptomatic short-term pain management and follow-up with outpatient neurology. I do not see a need for IV steroids at this time with no lesion or abnormal enhancement Plan discussed with Benedetto Goad, PA-C at the ER. -- Amie Portland, MD Neurologist [facility] Pager: [phone]    ---------------------------------------------------- Excerpt from addended report MR Lumbar spine  ADDENDUM REPORT: 10/03/2020 15:03   ADDENDUM: Dr. Rory Percy called to discuss this case. To my review, including review of the thoracic study performed 08/13/2020, I do not believe that there is a cord lesion demonstrated in the distal thoracic cord region. I believe that area is normal.     Electronically Signed   By: Nelson Chimes M.D.   On: 10/03/2020 15:03

## 2020-10-03 NOTE — ED Notes (Signed)
Pt ambulated to the restroom with assistance.  

## 2020-10-03 NOTE — Telephone Encounter (Signed)
Pt is asking for a call from RN to discuss her high level of pain, pt states she is now unable to walk

## 2020-10-03 NOTE — ED Notes (Signed)
Patient transported to MRI 

## 2020-10-03 NOTE — ED Triage Notes (Signed)
Pt arrives EMS after multiple encounters in the ER today. C/o bilateral muscle spasms in legs for > 2 months.

## 2020-10-03 NOTE — ED Triage Notes (Signed)
Pt c/o leg spasms for last 2 months. Pt states the spasms are in both legs. Pt states she has seen multiple doctors, states her neurologist told her to come to ED today. Pt states she is supposed to be getting a MRI.

## 2020-10-03 NOTE — ED Notes (Signed)
Pt ambulatory to restroom

## 2020-10-03 NOTE — Discharge Instructions (Addendum)
Please follow-up with your neurologist.  Use prescribed medications as needed for severe cramping and pain.  These medications can be sedating, do not take before driving.

## 2020-10-04 ENCOUNTER — Emergency Department (HOSPITAL_COMMUNITY)
Admission: EM | Admit: 2020-10-04 | Discharge: 2020-10-05 | Disposition: A | Discharge status: Home / Self Care | Payer: 59 | Attending: Emergency Medicine | Admitting: Emergency Medicine

## 2020-10-04 ENCOUNTER — Other Ambulatory Visit: Payer: Self-pay

## 2020-10-04 ENCOUNTER — Encounter (HOSPITAL_COMMUNITY): Payer: Self-pay | Admitting: Emergency Medicine

## 2020-10-04 ENCOUNTER — Emergency Department (HOSPITAL_COMMUNITY)
Admission: EM | Admit: 2020-10-04 | Discharge: 2020-10-04 | Disposition: A | Discharge status: Home / Self Care | Payer: 59 | Attending: Emergency Medicine | Admitting: Emergency Medicine

## 2020-10-04 DIAGNOSIS — Z79899 Other long term (current) drug therapy: Secondary | ICD-10-CM | POA: Insufficient documentation

## 2020-10-04 DIAGNOSIS — I10 Essential (primary) hypertension: Secondary | ICD-10-CM | POA: Insufficient documentation

## 2020-10-04 DIAGNOSIS — Z87891 Personal history of nicotine dependence: Secondary | ICD-10-CM | POA: Insufficient documentation

## 2020-10-04 DIAGNOSIS — Z5321 Procedure and treatment not carried out due to patient leaving prior to being seen by health care provider: Secondary | ICD-10-CM | POA: Insufficient documentation

## 2020-10-04 DIAGNOSIS — M62838 Other muscle spasm: Secondary | ICD-10-CM | POA: Insufficient documentation

## 2020-10-04 DIAGNOSIS — R252 Cramp and spasm: Secondary | ICD-10-CM

## 2020-10-04 DIAGNOSIS — Z8616 Personal history of COVID-19: Secondary | ICD-10-CM | POA: Insufficient documentation

## 2020-10-04 DIAGNOSIS — M79606 Pain in leg, unspecified: Secondary | ICD-10-CM | POA: Insufficient documentation

## 2020-10-04 NOTE — ED Notes (Signed)
Pt walked to restroom with minimal assistance.

## 2020-10-04 NOTE — ED Triage Notes (Signed)
Pt came from home via EMS. C/C: bilateral leg pain and weakness x 2 months. Pt states "it feels like contractions in my legs". Pt was given muscle relaxers 2 weeks ago but relief from pain is short lived. Pt was seen at [place] yesterday for same complaint but was not given a conclusive dx. Pt ambulatory with EMS. Pt stood to get in bed upon arrival to ED 138/72 96 bpm 18 RR 99% on RA 128 CBG

## 2020-10-04 NOTE — ED Triage Notes (Signed)
Pt arrive by [facility] for c/o increase leg pain since last night, prior history of muscle spasms.

## 2020-10-04 NOTE — ED Notes (Signed)
Pt reports her daughters boyfriend is driving her home

## 2020-10-04 NOTE — ED Notes (Signed)
Pt A&Ox4, ambulatory at d/c with independent steady gait but was wheeled out of ED via wheelchair. Pt verbalized understanding of d/c instructions and follow up care.

## 2020-10-04 NOTE — ED Notes (Signed)
Patient states she no longer wants to wait and is feeling better. NT in lobby advised PT to stay but she would not like to

## 2020-10-04 NOTE — ED Provider Notes (Signed)
Emergency Medicine Provider Triage Evaluation Note  Connie Herrera , a 45 y.o. female  was evaluated in triage.  Pt complains of muscle spasms. Pt has a history of chronic muscle spasms. Last seen yesterday for this. There is concern of possible demyelinating dz. She has flexeril and robaxin at home and took a dose of flexeril just PTA. States her sx are starting to improve.   Physical Exam  BP (!) 148/102 (BP Location: Right Arm)   Pulse (!) 115   Temp 98.7 F (37.1 C) (Oral)   Resp 14   LMP 10/02/2020 Comment: No chance per pt  SpO2 100%  Gen:   Awake, no distress   Resp:  Normal effort  MSK:   Moves extremities without difficulty  Other:    Medical Decision Making  Medically screening exam initiated at 12:53 PM.  Appropriate orders placed.  CARLISA EBLE was informed that the remainder of the evaluation will be completed by another provider, this initial triage assessment does not replace that evaluation, and the importance of remaining in the ED until their evaluation is complete.     Rayna Sexton, PA-C 10/04/20 1255    Horton, Alvin Critchley, DO 10/07/20 1839

## 2020-10-05 MED ORDER — PREDNISONE 20 MG PO TABS
40.0000 mg | ORAL_TABLET | Freq: Once | ORAL | Status: AC
  Administered 2020-10-05: 40 mg via ORAL
  Filled 2020-10-05: qty 2

## 2020-10-05 MED ORDER — KETOROLAC TROMETHAMINE 15 MG/ML IJ SOLN
15.0000 mg | Freq: Once | INTRAMUSCULAR | Status: AC
  Administered 2020-10-05: 15 mg via INTRAMUSCULAR
  Filled 2020-10-05: qty 1

## 2020-10-05 MED ORDER — METHYLPREDNISOLONE 4 MG PO TBPK: ORAL_TABLET | ORAL | 0 refills | Status: DC

## 2020-10-05 NOTE — ED Provider Notes (Signed)
[facility] DEPT Provider Note  CSN: [id number] Arrival date & time: 10/04/20 2207  Chief Complaint(s) Leg Pain  HPI Connie Herrera is a 45 y.o. female who presents to the emergency department for recurrent lower extremity muscle spasms.  Patient has been seen here on multiple occasions and has had an extensive work-up including reassuring MRIs of the brain, cervical, thoracic, lumbar regions.  Negative DVT US and plain films. No significant electrolyte derangements noted.  Patient did have mildly elevated CK levels likely related to the muscle spasms but appear to be clear. Last seen less than 24hrs ago.  Patient has been prescribed Flexeril and Percocets which provide minimal relief.  Lower extremity cramps or in bilateral lower extremities.  Exacerbated with movement and ambulation.  Patient has been referred to transitional care team for possible rehab.  She denies any new symptoms including lower extremity weakness.  No bladder/bowel incontinence.  She denies any other physical complaints.   Leg Pain  Past Medical History Past Medical History:  Diagnosis Date   Brain lesion    COVID-19 04/2020   Diplopia    Hypertension    Patient Active Problem List   Diagnosis Date Noted   Muscle spasms of both lower extremities 08/14/2020   Fibroid uterus 08/14/2020   Anemia due to blood loss, chronic 01/10/2020   Menorrhagia with regular cycle 01/09/2020   Class 1 obesity due to excess calories with body mass index (BMI) of 34.0 to 34.9 in adult 01/09/2020   Screening breast examination 02/07/2019   Breast pain 02/07/2019   Hypertension 01/26/2019   Paresthesias 08/04/2013   Breast pain, right 03/11/2012   Home Medication(s) Prior to Admission medications   Medication Sig Start Date End Date Taking? Authorizing Provider  methylPREDNISolone (MEDROL DOSEPAK) 4 MG TBPK tablet Use as directed on the package 10/05/20  Yes Avarie Tavano, Grayce Sessions, MD   acyclovir (ZOVIRAX) 200 MG capsule Take 2 capsules (400 mg total) by mouth 3 (three) times daily. Take for 5 days prn each outbreak. Patient not taking: Reported on 09/30/2020 03/18/20   Shelly Bombard, MD  amLODipine (NORVASC) 5 MG tablet Take 1 tablet (5 mg total) by mouth daily. 08/13/20   Lajean Saver, MD  HYDROcodone-acetaminophen (NORCO) 5-325 MG tablet Take 1 tablet by mouth every 6 (six) hours as needed. 10/03/20   Jacqlyn Larsen, PA-C  medroxyPROGESTERone (PROVERA) 10 MG tablet Take 10 mg by mouth daily. 09/30/20 hasn't started 09/18/20   [provider]  methocarbamol (ROBAXIN) 500 MG tablet Take 1 tablet (500 mg total) by mouth 2 (two) times daily. 10/03/20   Jacqlyn Larsen, PA-C  potassium chloride SA (KLOR-CON) 20 MEQ tablet Take 2 tablets (40 mEq total) by mouth 2 (two) times daily for 5 days. 08/14/20 08/19/20  Deno Etienne, DO  trimethoprim-polymyxin b (POLYTRIM) ophthalmic solution Apply 1-2 drops into affected eye QID x 5 days. 09/25/20   Brunetta Jeans, PA-C  hydrochlorothiazide (HYDRODIURIL) 25 MG tablet Take 1 tablet (25 mg total) by mouth daily. Must have office visit for refills 06/20/20 08/13/20  Brunetta Jeans, PA-C  Past Surgical History Past Surgical History:  Procedure Laterality Date   TUBAL LIGATION     Family History Family History  Problem Relation Age of Onset   Healthy Mother    Cirrhosis Father    Cancer Maternal Grandmother 86       breast   Breast cancer Maternal Grandmother     Social History Social History   Tobacco Use   Smoking status: Former    Packs/day: 0.50    Years: 4.00    Pack years: 2.00    Types: Cigarettes    Quit date: 06/04/2017    Years since quitting: 3.3   Smokeless tobacco: Never  Vaping Use   Vaping Use: Never used  Substance Use Topics   Alcohol use: No   Drug use: Yes    Types:  Marijuana    Comment: 09/30/20 maybe once a week   Allergies Patient has no known allergies.  Review of Systems Review of Systems All other systems are reviewed and are negative for acute change except as noted in the HPI  Physical Exam Vital Signs  I have reviewed the triage vital signs BP 131/81   Pulse 95   Temp 97.9 F (36.6 C) (Oral)   Resp 18   Ht 5\' 7"  (1.702 m)   Wt 101.6 kg   LMP 10/02/2020 Comment: No chance per pt  SpO2 100%   BMI 35.08 kg/m   Physical Exam Vitals reviewed.  Constitutional:      General: She is not in acute distress.    Appearance: She is well-developed. She is not diaphoretic.  HENT:     Head: Normocephalic and atraumatic.     Right Ear: External ear normal.     Left Ear: External ear normal.     Nose: Nose normal.  Eyes:     General: No scleral icterus.    Conjunctiva/sclera: Conjunctivae normal.  Neck:     Trachea: Phonation normal.  Cardiovascular:     Rate and Rhythm: Normal rate and regular rhythm.  Pulmonary:     Effort: Pulmonary effort is normal. No respiratory distress.     Breath sounds: No stridor.  Abdominal:     General: There is no distension.  Musculoskeletal:        General: Normal range of motion.     Cervical back: Normal range of motion.     Comments: Reports soreness to palpation of all the muscle groups in the lower extremities.  Spine Exam: Strength: 5/5 throughout LE bilaterally  Sensation: Intact to light touch in proximal and distal LE bilaterally Reflexes: no clonus   Neurological:     Mental Status: She is alert and oriented to person, place, and time.  Psychiatric:        Behavior: Behavior normal.    ED Results and Treatments Labs (all labs ordered are listed, but only abnormal results are displayed) Labs Reviewed - No data to display  EKG  EKG Interpretation  Date/Time:     Ventricular Rate:    PR Interval:    QRS Duration:   QT Interval:    QTC Calculation:   R Axis:     Text Interpretation:          Radiology No results found.  Pertinent labs & imaging results that were available during my care of the patient were reviewed by me and considered in my medical decision making (see chart for details).  Medications Ordered in ED Medications  ketorolac (TORADOL) 15 MG/ML injection 15 mg (15 mg Intramuscular Given 10/05/20 0004)  predniSONE (DELTASONE) tablet 40 mg (40 mg Oral Given 10/05/20 0026)                                                                                                                                    Procedures Procedures  (including critical care time)  Medical Decision Making / ED Course I have reviewed the nursing notes for this encounter and the patient's prior records (if available in EHR or on provided paperwork).   Connie Herrera was evaluated in Emergency Department on 10/05/2020 for the symptoms described in the history of present illness. She was evaluated in the context of the global COVID-19 pandemic, which necessitated consideration that the patient might be at risk for infection with the SARS-CoV-2 virus that causes COVID-19. Institutional protocols and algorithms that pertain to the evaluation of patients at risk for COVID-19 are in a state of rapid change based on information released by regulatory bodies including the CDC and federal and state organizations. These policies and algorithms were followed during the patient's care in the ED.  Recurrent lower extremity muscle spasms. Recent work-up reassuring without definitive source of symptoms. No need for repeat work-up at this time Patient was provided with IM Toradol.  Will prescribe steroid taper to assess for any possible inflammatory process. Recommended supportive management with light exercises and stretching, massage therapy, heat/cold therapy,  etc.  Already has follow up with PCP and Neurologists.      Final Clinical Impression(s) / ED Diagnoses Final diagnoses:  Muscle cramps   The patient appears reasonably screened and/or stabilized for discharge and I doubt any other medical condition or other [facility] requiring further screening, evaluation, or treatment in the ED at this time prior to discharge. Safe for discharge with strict return precautions.  Disposition: Discharge  Condition: Good  I have discussed the results, Dx and Tx plan with the patient/family who expressed understanding and agree(s) with the plan. Discharge instructions discussed at length. The patient/family was given strict return precautions who verbalized understanding of the instructions. No further questions at time of discharge.    ED Discharge Orders          Ordered    methylPREDNISolone (MEDROL DOSEPAK) 4 MG TBPK tablet        10/05/20 0020  Follow Up: Neurology  Go to  as scheduled      This chart was dictated using voice recognition software.  Despite best efforts to proofread,  errors can occur which can change the documentation meaning.    Fatima Blank, MD 10/05/20 1800

## 2020-10-05 NOTE — Discharge Instructions (Addendum)
You may use over-the-counter Motrin (Ibuprofen), Acetaminophen (Tylenol), topical muscle creams such as SalonPas, Icy Hot, Bengay, etc. Please stretch, apply ice or heat (whichever helps), and have massage therapy for additional assistance.  

## 2020-10-09 ENCOUNTER — Telehealth: Payer: Self-pay | Admitting: *Deleted

## 2020-10-09 DIAGNOSIS — G379 Demyelinating disease of central nervous system, unspecified: Secondary | ICD-10-CM

## 2020-10-09 NOTE — Telephone Encounter (Signed)
Called patient and informed her labs are unremarkable. She stated she had MRI lumbar spine at hospital and wants Dr Leta Baptist to see it. She stated steroids have made her feel better but she still has trouble walking. She hasn't started PT yet but will call them back and schedule to begin.  I advised she complete steroids and PT then let us know how she is doing. She agreed, verbalized understanding, appreciation.

## 2020-10-14 NOTE — Telephone Encounter (Signed)
I reviewed MRI scans and spoke with patient.  There is a subtle T2 hyperintensity within the left conus medullaris on MRI lumbar from 10/03/2020.  This is also noted on MRI thoracic spine from 08/13/2020.  While this could represent artifact, this does correlate with patient's symptoms and therefore I will proceed with further work-up with lumbar puncture.  Orders Placed This Encounter  Procedures   DG FL GUIDED LUMBAR PUNCTURE   Penni Bombard, MD 5/89/4834, 7:58 PM Certified in Neurology, Neurophysiology and Neuroimaging  [facility] [address], [place], [place] [address] [phone]

## 2020-10-14 NOTE — Addendum Note (Signed)
Addended by: Andrey Spearman R on: 10/14/2020 05:49 PM   Modules accepted: Orders

## 2020-10-21 ENCOUNTER — Ambulatory Visit: Payer: 59 | Admitting: Diagnostic Neuroimaging

## 2020-10-22 ENCOUNTER — Inpatient Hospital Stay
Admission: RE | Admit: 2020-10-22 | Discharge: 2020-10-22 | Disposition: A | Discharge status: Home / Self Care | Payer: Medicaid Other | Source: Ambulatory Visit | Attending: Diagnostic Neuroimaging | Admitting: Diagnostic Neuroimaging

## 2020-10-22 NOTE — Discharge Instructions (Signed)

## 2020-10-23 ENCOUNTER — Ambulatory Visit: Payer: Medicaid Other | Attending: Diagnostic Neuroimaging

## 2020-10-26 LAB — ANA W/REFLEX: ANA Titer 1: NEGATIVE

## 2020-10-26 LAB — SPECIMEN STATUS REPORT

## 2020-11-13 ENCOUNTER — Ambulatory Visit: Payer: Medicaid Other | Attending: Diagnostic Neuroimaging

## 2020-11-15 ENCOUNTER — Telehealth: Payer: Medicaid Other | Admitting: Physician Assistant

## 2020-11-15 DIAGNOSIS — H1033 Unspecified acute conjunctivitis, bilateral: Secondary | ICD-10-CM

## 2020-11-15 MED ORDER — POLYMYXIN B-TRIMETHOPRIM 10000-0.1 UNIT/ML-% OP SOLN: OPHTHALMIC | 0 refills | Status: DC

## 2020-11-15 NOTE — Patient Instructions (Signed)
  Connie Herrera, thank you for joining Leeanne Rio, PA-C for today's virtual visit.  While this provider is not your primary care provider (PCP), if your PCP is located in our provider database this encounter information will be shared with them immediately following your visit.  Consent: (Patient) Connie Herrera provided verbal consent for this virtual visit at the beginning of the encounter.  Current Medications:  Current Outpatient Medications:    acyclovir (ZOVIRAX) 200 MG capsule, Take 2 capsules (400 mg total) by mouth 3 (three) times daily. Take for 5 days prn each outbreak. (Patient not taking: Reported on 09/30/2020), Disp: 30 capsule, Rfl: 11   amLODipine (NORVASC) 5 MG tablet, Take 1 tablet (5 mg total) by mouth daily., Disp: 30 tablet, Rfl: 0   HYDROcodone-acetaminophen (NORCO) 5-325 MG tablet, Take 1 tablet by mouth every 6 (six) hours as needed., Disp: 6 tablet, Rfl: 0   medroxyPROGESTERone (PROVERA) 10 MG tablet, Take 10 mg by mouth daily. 09/30/20 hasn't started, Disp: , Rfl:    methocarbamol (ROBAXIN) 500 MG tablet, Take 1 tablet (500 mg total) by mouth 2 (two) times daily., Disp: 20 tablet, Rfl: 0   methylPREDNISolone (MEDROL DOSEPAK) 4 MG TBPK tablet, Use as directed on the package, Disp: 21 tablet, Rfl: 0   potassium chloride SA (KLOR-CON) 20 MEQ tablet, Take 2 tablets (40 mEq total) by mouth 2 (two) times daily for 5 days., Disp: 20 tablet, Rfl: 0   trimethoprim-polymyxin b (POLYTRIM) ophthalmic solution, Apply 1-2 drops into affected eyes QID x 5 days., Disp: 10 mL, Rfl: 0   Medications ordered in this encounter:  Meds ordered this encounter  Medications   trimethoprim-polymyxin b (POLYTRIM) ophthalmic solution    Sig: Apply 1-2 drops into affected eyes QID x 5 days.    Dispense:  10 mL    Refill:  0    Order Specific Question:   Supervising Provider    Answer:   Sabra Heck, BRIAN [[id number]]     *If you need refills on other medications prior to your next  appointment, please contact your pharmacy*  Follow-Up: Call back or seek an in-person evaluation if the symptoms worsen or if the condition fails to improve as anticipated.  Other Instructions No more attempts with these fake eyelashes/adhesive!! Make sure eyelids are cleaned well now that you have removed everything.  Apply warm compress over the eyes for 10-15 minutes a few times per day. Avoid touching the eyes. Keep hands washed.  Use the eye drops as directed.  If symptoms are not resolving, anything worsens or new symptoms develop, you need to be evaluated in person ASAP.    If you have been instructed to have an in-person evaluation today at a local Urgent Care facility, please use the link below. It will take you to a list of all of our available [facility], including address, phone number and hours of operation. Please do not delay care.  [facility]  If you or a family member do not have a primary care provider, use the link below to schedule a visit and establish care. When you choose a [facility] primary care physician or advanced practice provider, you gain a long-term partner in health. Find a Primary Care Provider  Learn more about [facility]'s in-office and virtual care options: [facility]

## 2020-11-15 NOTE — Progress Notes (Signed)
Virtual Visit Consent   Connie Herrera, you are scheduled for a virtual visit with a [facility] provider today.     Just as with appointments in the office, your consent must be obtained to participate.  Your consent will be active for this visit and any virtual visit you may have with one of our providers in the next 365 days.     If you have a MyChart account, a copy of this consent can be sent to you electronically.  All virtual visits are billed to your insurance company just like a traditional visit in the office.    As this is a virtual visit, video technology does not allow for your provider to perform a traditional examination.  This may limit your provider's ability to fully assess your condition.  If your provider identifies any concerns that need to be evaluated in person or the need to arrange testing (such as labs, EKG, etc.), we will make arrangements to do so.     Although advances in technology are sophisticated, we cannot ensure that it will always work on either your end or our end.  If the connection with a video visit is poor, the visit may have to be switched to a telephone visit.  With either a video or telephone visit, we are not always able to ensure that we have a secure connection.     I need to obtain your verbal consent now.   Are you willing to proceed with your visit today?    Connie Herrera has provided verbal consent on 11/15/2020 for a virtual visit (video or telephone).   Leeanne Rio, [place]   Date: 11/15/2020 5:28 PM   Virtual Visit via Video Note   I, Leeanne Rio, connected with  Connie Herrera  ([id number], Dec 31, 1975) on 11/15/20 at  5:30 PM EDT by a video-enabled telemedicine application and verified that I am speaking with the correct person using two identifiers.  Location: Patient: Virtual Visit Location Patient: Home Provider: Virtual Visit Location Provider: Home Office   I discussed the limitations of evaluation  and management by telemedicine and the availability of in person appointments. The patient expressed understanding and agreed to proceed.    History of Present Illness: Connie Herrera is a 45 y.o. who identifies as a female who was assigned female at birth, and is being seen today for bilateral eye redness and discomfort with drainage from both eyes x 2-3 days. Has history of bacterial conjunctivitis earlier this year after applying false eyelashes with adhesive. Notes she thought since she would doing well she should give them another change, quickly noting irritation so removed immediately. Since then has been getting drainage, worse in morning with matting. Denies fever, chills, vision change or overt eye pain.   HPI: HPI  Problems:  Patient Active Problem List   Diagnosis Date Noted   Muscle spasms of both lower extremities 08/14/2020   Fibroid uterus 08/14/2020   Anemia due to blood loss, chronic 01/10/2020   Menorrhagia with regular cycle 01/09/2020   Class 1 obesity due to excess calories with body mass index (BMI) of 34.0 to 34.9 in adult 01/09/2020   Screening breast examination 02/07/2019   Breast pain 02/07/2019   Hypertension 01/26/2019   Paresthesias 08/04/2013   Breast pain, right 03/11/2012    Allergies: No Known Allergies Medications:  Current Outpatient Medications:    acyclovir (ZOVIRAX) 200 MG capsule, Take 2 capsules (400 mg total) by mouth 3 (  three) times daily. Take for 5 days prn each outbreak. (Patient not taking: Reported on 09/30/2020), Disp: 30 capsule, Rfl: 11   amLODipine (NORVASC) 5 MG tablet, Take 1 tablet (5 mg total) by mouth daily., Disp: 30 tablet, Rfl: 0   HYDROcodone-acetaminophen (NORCO) 5-325 MG tablet, Take 1 tablet by mouth every 6 (six) hours as needed., Disp: 6 tablet, Rfl: 0   medroxyPROGESTERone (PROVERA) 10 MG tablet, Take 10 mg by mouth daily. 09/30/20 hasn't started, Disp: , Rfl:    methocarbamol (ROBAXIN) 500 MG tablet, Take 1 tablet  (500 mg total) by mouth 2 (two) times daily., Disp: 20 tablet, Rfl: 0   methylPREDNISolone (MEDROL DOSEPAK) 4 MG TBPK tablet, Use as directed on the package, Disp: 21 tablet, Rfl: 0   potassium chloride SA (KLOR-CON) 20 MEQ tablet, Take 2 tablets (40 mEq total) by mouth 2 (two) times daily for 5 days., Disp: 20 tablet, Rfl: 0   trimethoprim-polymyxin b (POLYTRIM) ophthalmic solution, Apply 1-2 drops into affected eyes QID x 5 days., Disp: 10 mL, Rfl: 0  Observations/Objective: Patient is well-developed, well-nourished in no acute distress.  Resting comfortably at home.  Head is normocephalic, atraumatic.  No labored breathing. Speech is clear and coherent with logical content.  Patient is alert and oriented at baseline.  Conjunctival injection noted bilaterally. Small amount of purulent drainage noted in corner of R eye around the medial canthus.   Assessment and Plan: 1. Acute bacterial conjunctivitis of both eyes - trimethoprim-polymyxin b (POLYTRIM) ophthalmic solution; Apply 1-2 drops into affected eyes QID x 5 days.  Dispense: 10 mL; Refill: 0 Supportive measures and OTC medications reviewed. She is to refrain from further use of these eyelashes and adhesive. Rx Polytrim. Will need in person assessment for any non-resolving, new or worsening symptoms.   Follow Up Instructions: I discussed the assessment and treatment plan with the patient. The patient was provided an opportunity to ask questions and all were answered. The patient agreed with the plan and demonstrated an understanding of the instructions.  A copy of instructions were sent to the patient via MyChart.  The patient was advised to call back or seek an in-person evaluation if the symptoms worsen or if the condition fails to improve as anticipated.  Time:  I spent 10 minutes with the patient via telehealth technology discussing the above problems/concerns.    Leeanne Rio, PA-C

## 2020-11-29 ENCOUNTER — Other Ambulatory Visit: Payer: Self-pay

## 2020-11-29 ENCOUNTER — Telehealth: Payer: Self-pay

## 2020-11-29 DIAGNOSIS — N6320 Unspecified lump in the left breast, unspecified quadrant: Secondary | ICD-10-CM

## 2020-11-29 DIAGNOSIS — N644 Mastodynia: Secondary | ICD-10-CM

## 2020-11-29 NOTE — Telephone Encounter (Signed)
I contacted patient per request of scheduler. Patient stated that she has left breast lump, pain, and wearing bras/shirts cause increase pain at times. Patient stated she was at work, could not talk, will try to call back, and is aware of [facility] appointment scheduled for 12/17/2020 @ 11:00 am.

## 2020-12-17 ENCOUNTER — Other Ambulatory Visit: Payer: Medicaid Other

## 2020-12-17 ENCOUNTER — Telehealth: Payer: Self-pay

## 2020-12-17 ENCOUNTER — Ambulatory Visit: Payer: Medicaid Other

## 2020-12-17 NOTE — Telephone Encounter (Signed)
Called patient to give rescheduled [facility] appointment. Patient is scheduled for Thursday, January 02, 2021 @ 11:15 am. Patient voiced understanding.

## 2021-01-02 ENCOUNTER — Ambulatory Visit: Payer: Self-pay | Admitting: *Deleted

## 2021-01-02 ENCOUNTER — Other Ambulatory Visit: Payer: Self-pay | Admitting: Obstetrics and Gynecology

## 2021-01-02 ENCOUNTER — Other Ambulatory Visit: Payer: Self-pay

## 2021-01-02 ENCOUNTER — Ambulatory Visit
Admission: RE | Admit: 2021-01-02 | Discharge: 2021-01-02 | Disposition: A | Discharge status: Home / Self Care | Payer: No Typology Code available for payment source | Source: Ambulatory Visit | Attending: Obstetrics and Gynecology | Admitting: Obstetrics and Gynecology

## 2021-01-02 ENCOUNTER — Ambulatory Visit
Admission: RE | Admit: 2021-01-02 | Discharge: 2021-01-02 | Disposition: A | Discharge status: Home / Self Care | Payer: Medicaid Other | Source: Ambulatory Visit | Attending: Obstetrics and Gynecology | Admitting: Obstetrics and Gynecology

## 2021-01-02 VITALS — BP 144/98 | Wt 215.3 lb

## 2021-01-02 DIAGNOSIS — N6322 Unspecified lump in the left breast, upper inner quadrant: Secondary | ICD-10-CM

## 2021-01-02 DIAGNOSIS — N644 Mastodynia: Secondary | ICD-10-CM

## 2021-01-02 DIAGNOSIS — N6320 Unspecified lump in the left breast, unspecified quadrant: Secondary | ICD-10-CM

## 2021-01-02 DIAGNOSIS — Z1239 Encounter for other screening for malignant neoplasm of breast: Secondary | ICD-10-CM

## 2021-01-02 NOTE — Patient Instructions (Signed)
Explained breast self awareness with Connie Herrera. Patient did not need a Pap smear today due to last Pap smear and HPV typing was 01/09/2020. Let her know [facility] will cover Pap smears and HPV typing every 5 years unless has a history of abnormal Pap smears. Referred patient to the [facility] for a diagnostic mammogram. Appointment scheduled Thursday, January 02, 2021 at 1400. Patient aware of appointment and will be there. Connie Herrera verbalized understanding.  Oshae Simmering, [place]vil [place], RN 11:35 AM

## 2021-01-02 NOTE — Progress Notes (Signed)
[name] is a 45 y.o. female who presents to [facility] clinic today with complaint of left breast lump x 2 months that has increased in size and is painful when touched. Patient rates the pain at a 5 out of 10.    Pap Smear: Pap smear not completed today. Last Pap smear was 01/09/2020 at [facility] [place] clinic and was normal with negative HPV. Per patient has no history of an abnormal Pap smear. Last Pap smear result is available in Epic.   Physical exam: Breasts Breasts symmetrical. No skin abnormalities bilateral breasts. No nipple retraction bilateral breasts. No nipple discharge bilateral breasts. No lymphadenopathy. No lumps palpated right breast. Palpated a lump within the left breast at 10 o'clock 5 cm from the nipple. Complaints of bilateral diffuse breast pain on exam that is greater when palpated lump. Patient stated she is currently on her menstrual period.  MS DIGITAL SCREENING TOMO BILATERAL  Result Date: 02/20/2020 CLINICAL DATA:  Screening. History of breast cysts. EXAM: DIGITAL SCREENING BILATERAL MAMMOGRAM WITH TOMO AND CAD COMPARISON:  Previous exam(s). ACR Breast Density Category d: The breast tissue is extremely dense, which lowers the sensitivity of mammography FINDINGS: There are no findings suspicious for malignancy. Images were processed with CAD. IMPRESSION: No mammographic evidence of malignancy. A result letter of this screening mammogram will be mailed directly to the patient. RECOMMENDATION: Screening mammogram in one year. (Code:SM-B-01Y) BI-RADS CATEGORY  1: Negative. Electronically Signed   By: Franki Cabot M.D.   On: 02/20/2020 10:34   MS DIGITAL DIAG TOMO BILAT  Result Date: 02/07/2019 CLINICAL DATA:  45 year old female presenting for evaluation of a palpable lump in the left breast. EXAM: DIGITAL DIAGNOSTIC BILATERAL MAMMOGRAM WITH CAD AND TOMO ULTRASOUND LEFT BREAST COMPARISON:  Previous exam(s). ACR Breast Density Category c:  The breast tissue is heterogeneously dense, which may obscure small masses. FINDINGS: There are multiple bilateral oval circumscribed masses consistent benign fibrocystic change. The palpable marker has been placed along the lower outer left breast. There are benign masses, likely cysts, within the breast tissue in the vicinity of this marker. No suspicious masses are identified. No suspicious calcifications, masses or areas of distortion are seen in the bilateral breasts. Mammographic images were processed with CAD. Ultrasound targeted to the palpable site in the left breast at 4 o'clock and 9 cm from the nipple demonstrates a hypoechoic lesion within the skin measuring 1.3 x 0.3 x 1.4 cm. A similar smaller lesion is seen at 4 o'clock, 7 cm from the nipple measuring 0.7 x 0.2 x 0.5 cm. IMPRESSION: 1. There are 2 benign sebaceous cysts in the lower outer quadrant of the left breast which corresponds with the palpable area of concern. 2.  No mammographic evidence of malignancy in the bilateral breasts. RECOMMENDATION: Screening mammogram in one year.(Code:SM-B-01Y) I have discussed the findings and recommendations with the patient. If applicable, a reminder letter will be sent to the patient regarding the next appointment. BI-RADS CATEGORY  2: Benign. Electronically Signed   By: Ammie Ferrier M.D.   On: 02/07/2019 10:41     Pelvic/Bimanual Pap is not indicated today per BCCCP guidelines.   Smoking History: Patient is a former smoker that quit 06/04/2017.   Patient Navigation: Patient education provided. Access to services provided for patient through BCCCP program.   Breast and Cervical Cancer Risk Assessment: Patient has a family history of her maternal grandmother having breast cancer. Patient has no known genetic mutations or history of  radiation treatment to the chest before age 62. Patient has no history of cervical dysplasia, immunocompromised, or DES exposure in-utero.  Risk Assessment      Risk Scores       01/02/2021 02/07/2019   Last edited by: Demetrius Revel, LPN Lasharn Bufkin, Heath Gold, RN   5-year risk: 0.8 % 0.7 %   Lifetime risk: 9.4 % 9.5 %            A: BCCCP exam without pap smear Complaint of left breast lump and pain.  P: Referred patient to the [facility] for a diagnostic mammogram. Appointment scheduled Thursday, January 02, 2021 at 1400.  Loletta Parish, RN 01/02/2021 11:35 AM

## 2021-01-13 ENCOUNTER — Telehealth: Payer: Self-pay

## 2021-01-13 ENCOUNTER — Other Ambulatory Visit: Payer: Self-pay | Admitting: Obstetrics and Gynecology

## 2021-01-13 DIAGNOSIS — Z1231 Encounter for screening mammogram for malignant neoplasm of breast: Secondary | ICD-10-CM

## 2021-01-13 NOTE — Telephone Encounter (Signed)
Telephoned patient at home number. Voice mail box not available. Patient needs to scheduled with BCCCP

## 2021-01-14 ENCOUNTER — Other Ambulatory Visit: Payer: Self-pay | Admitting: Internal Medicine

## 2021-01-20 ENCOUNTER — Telehealth: Payer: No Typology Code available for payment source | Admitting: Physician Assistant

## 2021-01-20 DIAGNOSIS — H1033 Unspecified acute conjunctivitis, bilateral: Secondary | ICD-10-CM

## 2021-01-20 DIAGNOSIS — I10 Essential (primary) hypertension: Secondary | ICD-10-CM

## 2021-01-20 MED ORDER — POLYMYXIN B-TRIMETHOPRIM 10000-0.1 UNIT/ML-% OP SOLN: 1.0000 [drp] | OPHTHALMIC | 0 refills | Status: DC

## 2021-01-20 MED ORDER — AMLODIPINE BESYLATE 5 MG PO TABS: 5.0000 mg | ORAL_TABLET | Freq: Every day | ORAL | 0 refills | Status: DC

## 2021-01-20 NOTE — Patient Instructions (Signed)
Connie Herrera, thank you for joining Mar Daring, PA-C for today's virtual visit.  While this provider is not your primary care provider (PCP), if your PCP is located in our provider database this encounter information will be shared with them immediately following your visit.  Consent: (Patient) Connie Herrera provided verbal consent for this virtual visit at the beginning of the encounter.  Current Medications:  Current Outpatient Medications:    trimethoprim-polymyxin b (POLYTRIM) ophthalmic solution, Place 1 drop into both eyes every 4 (four) hours., Disp: 10 mL, Rfl: 0   acyclovir (ZOVIRAX) 200 MG capsule, Take 2 capsules (400 mg total) by mouth 3 (three) times daily. Take for 5 days prn each outbreak. (Patient not taking: Reported on 09/30/2020), Disp: 30 capsule, Rfl: 11   amLODipine (NORVASC) 5 MG tablet, Take 1 tablet (5 mg total) by mouth daily., Disp: 30 tablet, Rfl: 0   medroxyPROGESTERone (PROVERA) 10 MG tablet, Take 10 mg by mouth daily. 09/30/20 hasn't started, Disp: , Rfl:    potassium chloride SA (KLOR-CON) 20 MEQ tablet, Take 2 tablets (40 mEq total) by mouth 2 (two) times daily for 5 days., Disp: 20 tablet, Rfl: 0   Medications ordered in this encounter:  Meds ordered this encounter  Medications   trimethoprim-polymyxin b (POLYTRIM) ophthalmic solution    Sig: Place 1 drop into both eyes every 4 (four) hours.    Dispense:  10 mL    Refill:  0    Order Specific Question:   Supervising Provider    Answer:   MILLER, BRIAN [[id number]]   amLODipine (NORVASC) 5 MG tablet    Sig: Take 1 tablet (5 mg total) by mouth daily.    Dispense:  30 tablet    Refill:  0    Order Specific Question:   Supervising Provider    Answer:   Sabra Heck, BRIAN [[id number]]     *If you need refills on other medications prior to your next appointment, please contact your pharmacy*  Follow-Up: Call back or seek an in-person evaluation if the symptoms worsen or if the condition fails to  improve as anticipated.  Other Instructions Bacterial Conjunctivitis, Adult Bacterial conjunctivitis is an infection of your conjunctiva. This is the clear membrane that covers the white part of your eye and the inner part of your eyelid. This infection can make your eye: Red or pink. Itchy or irritated. This condition spreads easily from person to person (is contagious) and from one eye to the other eye. What are the causes? This condition is caused by germs (bacteria). You may get the infection if you come into close contact with: A person who has the infection. Items that have germs on them (are contaminated), such as face towels, contact lens solution, or eye makeup. What increases the risk? You are more likely to get this condition if: You have contact with people who have the infection. You wear contact lenses. You have a sinus infection. You have had a recent eye injury or surgery. You have a weak body defense system (immune system). You have dry eyes. What are the signs or symptoms?  Thick, yellowish discharge from the eye. Tearing or watery eyes. Itchy eyes. Burning feeling in your eyes. Eye redness. Swollen eyelids. Blurred vision. How is this treated?  Antibiotic eye drops or ointment. Antibiotic medicine taken by mouth. This is used for infections that do not get better with drops or ointment or that last more than 10 days. Cool, wet cloths placed on  the eyes. Artificial tears used 2-6 times a day. Follow these instructions at home: Medicines Take or apply your antibiotic medicine as told by your doctor. Do not stop using it even if you start to feel better. Take or apply over-the-counter and prescription medicines only as told by your doctor. Do not touch your eyelid with the eye-drop bottle or the ointment tube. Managing discomfort Wipe any fluid from your eye with a warm, wet washcloth or a cotton ball. Place a clean, cool, wet cloth on your eye. Do this for  10-20 minutes, 3-4 times a day. General instructions Do not wear contacts until the infection is gone. Wear glasses until your doctor says it is okay to wear contacts again. Do not wear eye makeup until the infection is gone. Throw away old eye makeup. Change or wash your pillowcase every day. Do not share towels or washcloths. Wash your hands often with soap and water for at least 20 seconds and especially before touching your face or eyes. Use paper towels to dry your hands. Do not touch or rub your eyes. Do not drive or use heavy machinery if your vision is blurred. Contact a doctor if: You have a fever. You do not get better after 10 days. Get help right away if: You have a fever and your symptoms get worse all of a sudden. You have very bad pain when you move your eye. Your face: Hurts. Is red. Is swollen. You have sudden loss of vision. Summary Bacterial conjunctivitis is an infection of your conjunctiva. This infection spreads easily from person to person. Wash your hands often with soap and water for at least 20 seconds and especially before touching your face or eyes. Use paper towels to dry your hands. Take or apply your antibiotic medicine as told by your doctor. Contact a doctor if you have a fever or you do not get better after 10 days. This information is not intended to replace advice given to you by your health care provider. Make sure you discuss any questions you have with your health care provider. Document Revised: 07/03/2020 Document Reviewed: 07/03/2020 Elsevier Patient Education  2022 [employer].    If you have been instructed to have an in-person evaluation today at a local Urgent Care facility, please use the link below. It will take you to a list of all of our available [facility], including address, phone number and hours of operation. Please do not delay care.  [facility]  If you or a family member do not have a primary  care provider, use the link below to schedule a visit and establish care. When you choose a [facility] primary care physician or advanced practice provider, you gain a long-term partner in health. Find a Primary Care Provider  Learn more about [facility] in-office and virtual care options: [facility]

## 2021-01-20 NOTE — Progress Notes (Signed)
Virtual Visit Consent   GUISELLE MIAN, you are scheduled for a virtual visit with a [facility] provider today.     Just as with appointments in the office, your consent must be obtained to participate.  Your consent will be active for this visit and any virtual visit you may have with one of our providers in the next 365 days.     If you have a MyChart account, a copy of this consent can be sent to you electronically.  All virtual visits are billed to your insurance company just like a traditional visit in the office.    As this is a virtual visit, video technology does not allow for your provider to perform a traditional examination.  This may limit your provider's ability to fully assess your condition.  If your provider identifies any concerns that need to be evaluated in person or the need to arrange testing (such as labs, EKG, etc.), we will make arrangements to do so.     Although advances in technology are sophisticated, we cannot ensure that it will always work on either your end or our end.  If the connection with a video visit is poor, the visit may have to be switched to a telephone visit.  With either a video or telephone visit, we are not always able to ensure that we have a secure connection.     I need to obtain your verbal consent now.   Are you willing to proceed with your visit today?    SABRENA GAVITT has provided verbal consent on 01/20/2021 for a virtual visit (video or telephone).   Mar Daring, PA-C   Date: 01/20/2021 4:41 PM   Virtual Visit via Video Note   I, Mar Daring, connected with  SEREEN SCHAFF  ([id number], 1976-03-26) on 01/20/21 at  4:30 PM EDT by a video-enabled telemedicine application and verified that I am speaking with the correct person using two identifiers.  Location: Patient: Virtual Visit Location Patient: Home Provider: Virtual Visit Location Provider: Home Office   I discussed the limitations of  evaluation and management by telemedicine and the availability of in person appointments. The patient expressed understanding and agreed to proceed.    History of Present Illness: Connie Herrera is a 45 y.o. who identifies as a female who was assigned female at birth, and is being seen today for possible eye infection. She has had something similar before in August. Both occurrences have happened after using false eyelashes.   She is also requesting a refill on her blood pressure medications.   Problems:  Patient Active Problem List   Diagnosis Date Noted   Muscle spasms of both lower extremities 08/14/2020   Fibroid uterus 08/14/2020   Anemia due to blood loss, chronic 01/10/2020   Menorrhagia with regular cycle 01/09/2020   Class 1 obesity due to excess calories with body mass index (BMI) of 34.0 to 34.9 in adult 01/09/2020   Screening breast examination 02/07/2019   Breast pain 02/07/2019   Hypertension 01/26/2019   Paresthesias 08/04/2013   Breast pain, right 03/11/2012    Allergies: No Known Allergies Medications:  Current Outpatient Medications:    trimethoprim-polymyxin b (POLYTRIM) ophthalmic solution, Place 1 drop into both eyes every 4 (four) hours., Disp: 10 mL, Rfl: 0   acyclovir (ZOVIRAX) 200 MG capsule, Take 2 capsules (400 mg total) by mouth 3 (three) times daily. Take for 5 days prn each outbreak. (Patient not taking: Reported on 09/30/2020),  Disp: 30 capsule, Rfl: 11   amLODipine (NORVASC) 5 MG tablet, Take 1 tablet (5 mg total) by mouth daily., Disp: 30 tablet, Rfl: 0   HYDROcodone-acetaminophen (NORCO) 5-325 MG tablet, Take 1 tablet by mouth every 6 (six) hours as needed., Disp: 6 tablet, Rfl: 0   medroxyPROGESTERone (PROVERA) 10 MG tablet, Take 10 mg by mouth daily. 09/30/20 hasn't started, Disp: , Rfl:    methocarbamol (ROBAXIN) 500 MG tablet, Take 1 tablet (500 mg total) by mouth 2 (two) times daily., Disp: 20 tablet, Rfl: 0   methylPREDNISolone (MEDROL  DOSEPAK) 4 MG TBPK tablet, Use as directed on the package, Disp: 21 tablet, Rfl: 0   potassium chloride SA (KLOR-CON) 20 MEQ tablet, Take 2 tablets (40 mEq total) by mouth 2 (two) times daily for 5 days., Disp: 20 tablet, Rfl: 0  Observations/Objective: Patient is well-developed, well-nourished in no acute distress.  Resting comfortably at home.  Head is normocephalic, atraumatic.  No labored breathing.  Speech is clear and coherent with logical content.  Patient is alert and oriented at baseline.    Assessment and Plan: 1. Acute bacterial conjunctivitis of both eyes - trimethoprim-polymyxin b (POLYTRIM) ophthalmic solution; Place 1 drop into both eyes every 4 (four) hours.  Dispense: 10 mL; Refill: 0  2. Primary hypertension - amLODipine (NORVASC) 5 MG tablet; Take 1 tablet (5 mg total) by mouth daily.  Dispense: 30 tablet; Refill: 0  - Will treat with Polytrim - Avoid false eyelashes as there is probably an allergy component (allergic to the adhesive or possibly to material lash is made of) - Warm compresses - Amlodipine refilled x 1 month; MUST see PCP for more refills - Seek in person evaluation if eyes do not improve or if they worsen  Follow Up Instructions: I discussed the assessment and treatment plan with the patient. The patient was provided an opportunity to ask questions and all were answered. The patient agreed with the plan and demonstrated an understanding of the instructions.  A copy of instructions were sent to the patient via MyChart unless otherwise noted below.    The patient was advised to call back or seek an in-person evaluation if the symptoms worsen or if the condition fails to improve as anticipated.  Time:  I spent 10 minutes with the patient via telehealth technology discussing the above problems/concerns.    Mar Daring, PA-C

## 2021-02-02 ENCOUNTER — Other Ambulatory Visit: Payer: Self-pay

## 2021-02-02 ENCOUNTER — Ambulatory Visit (HOSPITAL_COMMUNITY)
Admission: EM | Admit: 2021-02-02 | Discharge: 2021-02-02 | Disposition: A | Discharge status: Home / Self Care | Payer: No Typology Code available for payment source | Attending: Emergency Medicine | Admitting: Emergency Medicine

## 2021-02-02 ENCOUNTER — Encounter (HOSPITAL_COMMUNITY): Payer: Self-pay

## 2021-02-02 DIAGNOSIS — N898 Other specified noninflammatory disorders of vagina: Secondary | ICD-10-CM

## 2021-02-02 DIAGNOSIS — R102 Pelvic and perineal pain: Secondary | ICD-10-CM

## 2021-02-02 LAB — POC URINE PREG, ED: Preg Test, Ur: NEGATIVE

## 2021-02-02 LAB — POCT URINALYSIS DIPSTICK, ED / UC
Bilirubin Urine: NEGATIVE
Glucose, UA: NEGATIVE mg/dL
Hgb urine dipstick: NEGATIVE
Ketones, ur: NEGATIVE mg/dL
Leukocytes,Ua: NEGATIVE
Nitrite: NEGATIVE
Protein, ur: NEGATIVE mg/dL
Specific Gravity, Urine: 1.025 (ref 1.005–1.030)
Urobilinogen, UA: 0.2 mg/dL (ref 0.0–1.0)
pH: 6 (ref 5.0–8.0)

## 2021-02-02 NOTE — ED Provider Notes (Signed)
[facility]    CSN: [id number] Arrival date & time: 02/02/21  1115      History   Chief Complaint Chief Complaint  Patient presents with   Abdominal Pain   Vaginal Discharge    HPI Connie Herrera is a 45 y.o. female.   Patient here for lower abdominal pain and pressure that has been ongoing for the past 2 days.  Also reports having some vaginal discharge.  Patient does report history of uterine fibroids but reports this pain is slightly different.  Patient is unsure about odor as she has been unable to smell since she had COVID.  Has not taken any OTC medications or treatments.  Denies any trauma, injury, or other precipitating event.  Denies any specific alleviating or aggravating factors.  Denies any fevers, chest pain, shortness of breath, N/V/D, numbness, tingling, weakness, or headaches.    The history is provided by the patient.  Abdominal Pain Associated symptoms: vaginal discharge   Vaginal Discharge Associated symptoms: abdominal pain    Past Medical History:  Diagnosis Date   Brain lesion    COVID-19 04/2020   Diplopia    Hypertension     Patient Active Problem List   Diagnosis Date Noted   Muscle spasms of both lower extremities 08/14/2020   Fibroid uterus 08/14/2020   Anemia due to blood loss, chronic 01/10/2020   Menorrhagia with regular cycle 01/09/2020   Class 1 obesity due to excess calories with body mass index (BMI) of 34.0 to 34.9 in adult 01/09/2020   Screening breast examination 02/07/2019   Breast pain 02/07/2019   Hypertension 01/26/2019   Paresthesias 08/04/2013   Breast pain, right 03/11/2012    Past Surgical History:  Procedure Laterality Date   TUBAL LIGATION      OB History     Gravida  4   Para  4   Term  4   Preterm  0   AB  0   Living  4      SAB  0   IAB  0   Ectopic  0   Multiple  0   Live Births  4            Home Medications    Prior to Admission medications   Medication Sig  Start Date End Date Taking? Authorizing Provider  acyclovir (ZOVIRAX) 200 MG capsule Take 2 capsules (400 mg total) by mouth 3 (three) times daily. Take for 5 days prn each outbreak. Patient not taking: Reported on 09/30/2020 03/18/20   Shelly Bombard, MD  amLODipine (NORVASC) 5 MG tablet Take 1 tablet (5 mg total) by mouth daily. 01/20/21   Mar Daring, PA-C  medroxyPROGESTERone (PROVERA) 10 MG tablet Take 10 mg by mouth daily. 09/30/20 hasn't started 09/18/20   [provider]  potassium chloride SA (KLOR-CON) 20 MEQ tablet Take 2 tablets (40 mEq total) by mouth 2 (two) times daily for 5 days. 08/14/20 08/19/20  Deno Etienne, DO  trimethoprim-polymyxin b (POLYTRIM) ophthalmic solution Place 1 drop into both eyes every 4 (four) hours. 01/20/21   Mar Daring, PA-C  hydrochlorothiazide (HYDRODIURIL) 25 MG tablet Take 1 tablet (25 mg total) by mouth daily. Must have office visit for refills 06/20/20 08/13/20  Brunetta Jeans, PA-C    Family History Family History  Problem Relation Age of Onset   Healthy Mother    Cirrhosis Father    Cancer Maternal Grandmother 77       breast  Breast cancer Maternal Grandmother     Social History Social History   Tobacco Use   Smoking status: Former    Packs/day: 0.50    Years: 4.00    Pack years: 2.00    Types: Cigarettes    Quit date: 06/04/2017    Years since quitting: 3.6   Smokeless tobacco: Never  Vaping Use   Vaping Use: Never used  Substance Use Topics   Alcohol use: No   Drug use: Yes    Types: Marijuana    Comment: 09/30/20 maybe once a week     Allergies   Patient has no known allergies.   Review of Systems Review of Systems  Gastrointestinal:  Positive for abdominal pain.  Genitourinary:  Positive for vaginal discharge.  All other systems reviewed and are negative.   Physical Exam Triage Vital Signs ED Triage Vitals  Enc Vitals Group     BP 02/02/21 1228 (!) 153/92     Pulse Rate 02/02/21 1228  93     Resp 02/02/21 1228 20     Temp 02/02/21 1228 98.7 F (37.1 C)     Temp Source 02/02/21 1228 Oral     SpO2 02/02/21 1228 100 %     Weight --      Height --      Head Circumference --      Peak Flow --      Pain Score 02/02/21 1230 9     Pain Loc --      Pain Edu? --      Excl. in [place]? --    No data found.  Updated Vital Signs BP (!) 153/92 (BP Location: Left Arm)   Pulse 93   Temp 98.7 F (37.1 C) (Oral)   Resp 20   SpO2 100%   Visual Acuity Right Eye Distance:   Left Eye Distance:   Bilateral Distance:    Right Eye Near:   Left Eye Near:    Bilateral Near:     Physical Exam Vitals and nursing note reviewed.  Constitutional:      General: She is not in acute distress.    Appearance: Normal appearance. She is not ill-appearing, toxic-appearing or diaphoretic.  HENT:     Head: Normocephalic and atraumatic.  Eyes:     Conjunctiva/sclera: Conjunctivae normal.  Cardiovascular:     Rate and Rhythm: Normal rate.     Pulses: Normal pulses.  Pulmonary:     Effort: Pulmonary effort is normal.  Abdominal:     General: Abdomen is flat.     Palpations: Abdomen is soft.     Tenderness: There is abdominal tenderness in the suprapubic area. There is no right CVA tenderness or left CVA tenderness.  Genitourinary:    Comments: declines Musculoskeletal:        General: Normal range of motion.     Cervical back: Normal range of motion.  Skin:    General: Skin is warm and dry.  Neurological:     General: No focal deficit present.     Mental Status: She is alert and oriented to person, place, and time.  Psychiatric:        Mood and Affect: Mood normal.     UC Treatments / Results  Labs (all labs ordered are listed, but only abnormal results are displayed) Labs Reviewed  POCT URINALYSIS DIPSTICK, ED / UC  POC URINE PREG, ED  CERVICOVAGINAL ANCILLARY ONLY    EKG   Radiology No results found.  Procedures Procedures (including critical care  time)  Medications Ordered in UC Medications - No data to display  Initial Impression / Assessment and Plan / UC Course  I have reviewed the triage vital signs and the nursing notes.  Pertinent labs & imaging results that were available during my care of the patient were reviewed by me and considered in my medical decision making (see chart for details).     Assessment negative for red flags or concerns.  Urinalysis within normal limits with no signs of infection.  Self swab obtained and will treat based on results.  Vaginal discharge and pelvic pain possibly related to uterine fibroids.  Tylenol and or ibuprofen as needed.  Encourage fluids and rest.  May use a heating pad or warm compress to help with abdominal pain and cramps.  Follow-up with primary care for reevaluation or OB/GYN. Final Clinical Impressions(s) / UC Diagnoses   Final diagnoses:  Vaginal discharge  Pelvic pain in female     Discharge Instructions      You can take Tylenol and/or Ibuprofen as needed for pain relief and fever reduction.   You can use a heating pain or warm compress to help with abdominal pain and cramps.   Make sure you are drinking plenty of fluids, especially water.   We will contact you if the results from your lab work are positive and require additional treatment.    Do not have sex while taking undergoing treatment for STI.  Make sure that all of your partners get tested and treated.  Use a condom or other barrier method for all sexual encounters.    Return or go to the Emergency Department if symptoms worsen or do not improve in the next few days.      ED Prescriptions   None    PDMP not reviewed this encounter.   Pearson Forster, NP 02/02/21 1302

## 2021-02-02 NOTE — ED Triage Notes (Signed)
Pt presents with lower abdominal pain and abnormal vaginal discharge X 2 days.

## 2021-02-02 NOTE — Discharge Instructions (Addendum)
You can take Tylenol and/or Ibuprofen as needed for pain relief and fever reduction.   You can use a heating pain or warm compress to help with abdominal pain and cramps.   Make sure you are drinking plenty of fluids, especially water.   We will contact you if the results from your lab work are positive and require additional treatment.    Do not have sex while taking undergoing treatment for STI.  Make sure that all of your partners get tested and treated.  Use a condom or other barrier method for all sexual encounters.    Return or go to the Emergency Department if symptoms worsen or do not improve in the next few days.

## 2021-02-03 LAB — CERVICOVAGINAL ANCILLARY ONLY
Bacterial Vaginitis (gardnerella): POSITIVE — AB
Candida Glabrata: NEGATIVE
Candida Vaginitis: NEGATIVE
Chlamydia: NEGATIVE
Comment: NEGATIVE
Comment: NEGATIVE
Comment: NEGATIVE
Comment: NEGATIVE
Comment: NEGATIVE
Comment: NORMAL
Neisseria Gonorrhea: NEGATIVE
Trichomonas: NEGATIVE

## 2021-02-04 ENCOUNTER — Telehealth (HOSPITAL_COMMUNITY): Payer: Self-pay | Admitting: Emergency Medicine

## 2021-02-04 MED ORDER — METRONIDAZOLE 500 MG PO TABS
500.0000 mg | ORAL_TABLET | Freq: Two times a day (BID) | ORAL | 0 refills | Status: DC
Start: 2021-02-04 — End: 2021-03-19

## 2021-02-12 ENCOUNTER — Encounter (HOSPITAL_COMMUNITY): Payer: Self-pay

## 2021-02-12 ENCOUNTER — Ambulatory Visit (HOSPITAL_COMMUNITY)
Admission: EM | Admit: 2021-02-12 | Discharge: 2021-02-12 | Disposition: A | Discharge status: Home / Self Care | Payer: Self-pay | Attending: Family Medicine | Admitting: Family Medicine

## 2021-02-12 ENCOUNTER — Emergency Department (HOSPITAL_COMMUNITY)
Admission: EM | Admit: 2021-02-12 | Discharge: 2021-02-12 | Disposition: A | Discharge status: Home / Self Care | Payer: Medicaid Other | Attending: Emergency Medicine | Admitting: Emergency Medicine

## 2021-02-12 ENCOUNTER — Encounter (HOSPITAL_COMMUNITY): Payer: Self-pay | Admitting: Emergency Medicine

## 2021-02-12 ENCOUNTER — Encounter (HOSPITAL_BASED_OUTPATIENT_CLINIC_OR_DEPARTMENT_OTHER): Payer: Self-pay

## 2021-02-12 ENCOUNTER — Emergency Department (HOSPITAL_BASED_OUTPATIENT_CLINIC_OR_DEPARTMENT_OTHER)
Admission: EM | Admit: 2021-02-12 | Discharge: 2021-02-12 | Disposition: A | Discharge status: Home / Self Care | Payer: No Typology Code available for payment source | Attending: Emergency Medicine | Admitting: Emergency Medicine

## 2021-02-12 ENCOUNTER — Other Ambulatory Visit: Payer: Self-pay

## 2021-02-12 DIAGNOSIS — Z8616 Personal history of COVID-19: Secondary | ICD-10-CM | POA: Insufficient documentation

## 2021-02-12 DIAGNOSIS — M79605 Pain in left leg: Secondary | ICD-10-CM

## 2021-02-12 DIAGNOSIS — G8929 Other chronic pain: Secondary | ICD-10-CM

## 2021-02-12 DIAGNOSIS — M62838 Other muscle spasm: Secondary | ICD-10-CM | POA: Insufficient documentation

## 2021-02-12 DIAGNOSIS — Z87891 Personal history of nicotine dependence: Secondary | ICD-10-CM | POA: Insufficient documentation

## 2021-02-12 DIAGNOSIS — I10 Essential (primary) hypertension: Secondary | ICD-10-CM | POA: Insufficient documentation

## 2021-02-12 DIAGNOSIS — Z79899 Other long term (current) drug therapy: Secondary | ICD-10-CM | POA: Insufficient documentation

## 2021-02-12 DIAGNOSIS — M79604 Pain in right leg: Secondary | ICD-10-CM

## 2021-02-12 LAB — CBC WITH DIFFERENTIAL/PLATELET
Abs Immature Granulocytes: 0.03 10*3/uL (ref 0.00–0.07)
Basophils Absolute: 0 10*3/uL (ref 0.0–0.1)
Basophils Relative: 0 %
Eosinophils Absolute: 0 10*3/uL (ref 0.0–0.5)
Eosinophils Relative: 1 %
HCT: 36.4 % (ref 36.0–46.0)
Hemoglobin: 11.5 g/dL — ABNORMAL LOW (ref 12.0–15.0)
Immature Granulocytes: 0 %
Lymphocytes Relative: 23 %
Lymphs Abs: 1.7 10*3/uL (ref 0.7–4.0)
MCH: 24.9 pg — ABNORMAL LOW (ref 26.0–34.0)
MCHC: 31.6 g/dL (ref 30.0–36.0)
MCV: 79 fL — ABNORMAL LOW (ref 80.0–100.0)
Monocytes Absolute: 0.5 10*3/uL (ref 0.1–1.0)
Monocytes Relative: 7 %
Neutro Abs: 5.2 10*3/uL (ref 1.7–7.7)
Neutrophils Relative %: 69 %
Platelets: 520 10*3/uL — ABNORMAL HIGH (ref 150–400)
RBC: 4.61 MIL/uL (ref 3.87–5.11)
RDW: 14.9 % (ref 11.5–15.5)
WBC: 7.4 10*3/uL (ref 4.0–10.5)
nRBC: 0 % (ref 0.0–0.2)

## 2021-02-12 LAB — COMPREHENSIVE METABOLIC PANEL
ALT: 10 U/L (ref 0–44)
AST: 11 U/L — ABNORMAL LOW (ref 15–41)
Albumin: 4.4 g/dL (ref 3.5–5.0)
Alkaline Phosphatase: 67 U/L (ref 38–126)
Anion gap: 8 (ref 5–15)
BUN: 8 mg/dL (ref 6–20)
CO2: 26 mmol/L (ref 22–32)
Calcium: 8.6 mg/dL — ABNORMAL LOW (ref 8.9–10.3)
Chloride: 104 mmol/L (ref 98–111)
Creatinine, Ser: 0.63 mg/dL (ref 0.44–1.00)
GFR, Estimated: >60 mL/min (ref >60)
Glucose, Bld: 84 mg/dL (ref 70–99)
Potassium: 3.4 mmol/L — ABNORMAL LOW (ref 3.5–5.1)
Sodium: 138 mmol/L (ref 135–145)
Total Bilirubin: 0.3 mg/dL (ref 0.3–1.2)
Total Protein: 7.5 g/dL (ref 6.5–8.1)

## 2021-02-12 LAB — URINALYSIS, ROUTINE W REFLEX MICROSCOPIC
Bilirubin Urine: NEGATIVE
Glucose, UA: NEGATIVE mg/dL
Hgb urine dipstick: NEGATIVE
Ketones, ur: NEGATIVE mg/dL
Leukocytes,Ua: NEGATIVE
Nitrite: NEGATIVE
Protein, ur: NEGATIVE mg/dL
Specific Gravity, Urine: 1.01 (ref 1.005–1.030)
pH: 7 (ref 5.0–8.0)

## 2021-02-12 LAB — CK: Total CK: 112 U/L (ref 38–234)

## 2021-02-12 LAB — PREGNANCY, URINE: Preg Test, Ur: NEGATIVE

## 2021-02-12 MED ORDER — ACETAMINOPHEN 325 MG PO TABS: 650.0000 mg | ORAL_TABLET | Freq: Four times a day (QID) | ORAL | 0 refills | Status: DC | PRN

## 2021-02-12 MED ORDER — HYDROCODONE-ACETAMINOPHEN 5-325 MG PO TABS
1.0000 | ORAL_TABLET | Freq: Once | ORAL | Status: AC
Start: 2021-02-12 — End: 2021-02-12
  Administered 2021-02-12: 1 via ORAL
  Filled 2021-02-12: qty 1

## 2021-02-12 MED ORDER — METHOCARBAMOL 750 MG PO TABS: 750.0000 mg | ORAL_TABLET | Freq: Four times a day (QID) | ORAL | 0 refills | Status: DC

## 2021-02-12 MED ORDER — OXYCODONE-ACETAMINOPHEN 5-325 MG PO TABS
1.0000 | ORAL_TABLET | Freq: Once | ORAL | Status: AC
  Administered 2021-02-12: 1 via ORAL
  Filled 2021-02-12: qty 1

## 2021-02-12 MED ORDER — OXYCODONE-ACETAMINOPHEN 5-325 MG PO TABS: 1.0000 | ORAL_TABLET | ORAL | 0 refills | Status: DC | PRN

## 2021-02-12 MED ORDER — METHOCARBAMOL 500 MG PO TABS
500.0000 mg | ORAL_TABLET | Freq: Once | ORAL | Status: AC
  Administered 2021-02-12: 500 mg via ORAL
  Filled 2021-02-12: qty 1

## 2021-02-12 MED ORDER — METHYLPREDNISOLONE 4 MG PO TBPK: ORAL_TABLET | Freq: Four times a day (QID) | ORAL | 0 refills | Status: AC

## 2021-02-12 NOTE — Discharge Instructions (Addendum)
You were seen in the emergency department for muscle spasms of your lower extremities.  Your labs today look stable from the previous ones.  As we discussed at bedside you should call neurology tomorrow and ensure that you have your lumbar puncture it scheduled so that they continue to work-up why you are having the symptoms.  I have prescribed you a steroid Dosepak for you to take over the next 6 days.  I have also prescribed you Tylenol to take at home.  Please come back to the emergency department if you have inability to: Trouble your bowel or bladder, weakness in your legs inability to walk, fever, or numbness to the groin region.

## 2021-02-12 NOTE — ED Notes (Signed)
ED Provider at bedside. 

## 2021-02-12 NOTE — ED Notes (Signed)
RN reviewed discharge instructions w/ pt. Follow up and prescriptions reviewed, pt had no further questions °

## 2021-02-12 NOTE — ED Triage Notes (Signed)
Pt here for bilateral leg pain and muscle spasms. Seen at [facility] last night, and UC today for the same. Pt says this happened a few months ago and she received steroids which helped.

## 2021-02-12 NOTE — ED Triage Notes (Signed)
Pt reports ongoing bilateral leg pain and muscles spasms for a while, today is worse increased urinary frequency started today.   Pt reports she was seeing today at [facility] ED for  same complaint  and was given steroids. States she needs medication for pain, if not she wont be able to sleep tonight.

## 2021-02-12 NOTE — ED Provider Notes (Signed)
[facility]    CSN: [id number] Arrival date & time: 02/12/21  1809      History   Chief Complaint Chief Complaint  Patient presents with   Leg Pain    HPI Connie Herrera is a 45 y.o. female.   HPI Patient presents today requesting pain medication for bilateral leg muscle spasms.  Patient was seen and evaluated thoroughly at the emergency department today and was given a dose of hydrocodone during her visit however the provider discharged her with prednisone along with the Tylenol.  Patient reports that she is unable to sleep without any type of pain medication.   Patient is established with both primary care along with neurology and admits that she has not followed up with the neurologist as recommended however she is calling them tomorrow.  Past Medical History:  Diagnosis Date   Brain lesion    COVID-19 04/2020   Diplopia    Hypertension     Patient Active Problem List   Diagnosis Date Noted   Muscle spasms of both lower extremities 08/14/2020   Fibroid uterus 08/14/2020   Anemia due to blood loss, chronic 01/10/2020   Menorrhagia with regular cycle 01/09/2020   Class 1 obesity due to excess calories with body mass index (BMI) of 34.0 to 34.9 in adult 01/09/2020   Screening breast examination 02/07/2019   Breast pain 02/07/2019   Hypertension 01/26/2019   Paresthesias 08/04/2013   Breast pain, right 03/11/2012    Past Surgical History:  Procedure Laterality Date   TUBAL LIGATION      OB History     Gravida  4   Para  4   Term  4   Preterm  0   AB  0   Living  4      SAB  0   IAB  0   Ectopic  0   Multiple  0   Live Births  4            Home Medications    Prior to Admission medications   Medication Sig Start Date End Date Taking? Authorizing Provider  methocarbamol (ROBAXIN) 750 MG tablet Take 1 tablet (750 mg total) by mouth 4 (four) times daily. 02/12/21  Yes Scot Jun, FNP  acetaminophen (TYLENOL) 325  MG tablet Take 2 tablets (650 mg total) by mouth every 6 (six) hours as needed for moderate pain. 02/12/21   Mickie Hillier, PA-C  acyclovir (ZOVIRAX) 200 MG capsule Take 2 capsules (400 mg total) by mouth 3 (three) times daily. Take for 5 days prn each outbreak. Patient not taking: Reported on 09/30/2020 03/18/20   Shelly Bombard, MD  amLODipine (NORVASC) 5 MG tablet Take 1 tablet (5 mg total) by mouth daily. 01/20/21   Mar Daring, PA-C  medroxyPROGESTERone (PROVERA) 10 MG tablet Take 10 mg by mouth daily. 09/30/20 hasn't started 09/18/20   [provider]  methylPREDNISolone (MEDROL DOSEPAK) 4 MG TBPK tablet Take by mouth taper from 4 doses each day to 1 dose and stop for 6 days. Taper over 6 days 02/12/21 02/18/21  Mickie Hillier, PA-C  metroNIDAZOLE (FLAGYL) 500 MG tablet Take 1 tablet (500 mg total) by mouth 2 (two) times daily. 02/04/21   Lamptey, Myrene Galas, MD  potassium chloride SA (KLOR-CON) 20 MEQ tablet Take 2 tablets (40 mEq total) by mouth 2 (two) times daily for 5 days. 08/14/20 08/19/20  Deno Etienne, DO  trimethoprim-polymyxin b (POLYTRIM) ophthalmic solution Place 1  drop into both eyes every 4 (four) hours. 01/20/21   Mar Daring, PA-C  hydrochlorothiazide (HYDRODIURIL) 25 MG tablet Take 1 tablet (25 mg total) by mouth daily. Must have office visit for refills 06/20/20 08/13/20  Brunetta Jeans, PA-C    Family History Family History  Problem Relation Age of Onset   Healthy Mother    Cirrhosis Father    Cancer Maternal Grandmother 79       breast   Breast cancer Maternal Grandmother     Social History Social History   Tobacco Use   Smoking status: Former    Packs/day: 0.50    Years: 4.00    Pack years: 2.00    Types: Cigarettes    Quit date: 06/04/2017    Years since quitting: 3.6   Smokeless tobacco: Never  Vaping Use   Vaping Use: Never used  Substance Use Topics   Alcohol use: No   Drug use: Yes    Types: Marijuana    Comment: 09/30/20  maybe once a week     Allergies   Patient has no known allergies.   Review of Systems Review of Systems Pertinent negatives listed in HPI   Physical Exam Triage Vital Signs ED Triage Vitals  Enc Vitals Group     BP 02/12/21 1858 (!) 166/93     Pulse Rate 02/12/21 1858 90     Resp 02/12/21 1858 18     Temp 02/12/21 1858 98.5 F (36.9 C)     Temp Source 02/12/21 1858 Oral     SpO2 02/12/21 1858 95 %     Weight --      Height --      Head Circumference --      Peak Flow --      Pain Score 02/12/21 1856 9     Pain Loc --      Pain Edu? --      Excl. in [place]? --    No data found.  Updated Vital Signs BP (!) 166/93 (BP Location: Right Arm)   Pulse 90   Temp 98.5 F (36.9 C) (Oral)   Resp 18   LMP  (Within Weeks) Comment: 2 weeks  SpO2 95%   Visual Acuity Right Eye Distance:   Left Eye Distance:   Bilateral Distance:    Right Eye Near:   Left Eye Near:    Bilateral Near:     Physical Exam General appearance: patient is drowsy during exam, cooperative, no distress Head: Normocephalic, without obvious abnormality, atraumatic Respiratory: Respirations even and unlabored, normal respiratory rate Heart: Rate and rhythm normal.  Skin: Skin color, texture, turgor normal. No rashes seen  Psych: Appropriate mood and affect. Neurologic: No visible abnormality noted on exam.   UC Treatments / Results  Labs (all labs ordered are listed, but only abnormal results are displayed) Labs Reviewed - No data to display  EKG   Radiology No results found.  Procedures Procedures (including critical care time)  Medications Ordered in UC Medications - No data to display  Initial Impression / Assessment and Plan / UC Course  I have reviewed the triage vital signs and the nursing notes.  Pertinent labs & imaging results that were available during my care of the patient were reviewed by me and considered in my medical decision making (see chart for details).     Patient educated extensively that we do not treat chronic pain here in setting of urgent care.  Secondly patient has been  thoroughly evaluated in setting of the emergency department provided opted against giving oral pain medication therefore I will continue with symptomatic treatment although I did offer patient a muscle relaxer she stated that this will not help her sleep.  Methocarbamol had been prescribed to her in the past offered the higher dose of methocarbamol and advised her to try a muscle relaxer and follow-up with her primary care and neurologist tomorrow for further evaluation and recommendations on pain control. Final Clinical Impressions(s) / UC Diagnoses   Final diagnoses:  Chronic pain of both lower extremities     Discharge Instructions      For management of chronic pain please follow-up with either neurologist or primary care provider.    ED Prescriptions     Medication Sig Dispense Auth. Provider   methocarbamol (ROBAXIN) 750 MG tablet Take 1 tablet (750 mg total) by mouth 4 (four) times daily. 30 tablet Scot Jun, FNP      PDMP not reviewed this encounter.   Scot Jun, FNP 02/12/21 2054

## 2021-02-12 NOTE — Discharge Instructions (Signed)
Follow up with neurology as planned. See your primary care doctor for further pain management.

## 2021-02-12 NOTE — ED Triage Notes (Signed)
She c/o bilat. Leg myalgias and "muscle spasms" "for a while now". She has undergone multiple diagnostics, including MRI. At one point she states she was mis-diagnosed as having M.S. She also mentions great improvement, essentially total abatement (temporarily) of her symptoms with a course of steroids.

## 2021-02-12 NOTE — ED Provider Notes (Signed)
[facility] EMERGENCY DEPARTMENT Provider Note   CSN: [id number] Arrival date & time: 02/12/21  1941     History Chief Complaint  Patient presents with   Leg Pain    Connie Herrera is a 45 y.o. female.  Patient to Ed c/o severe pain in the right lower extremity, "spasm" type pain. It started in bilateral LE's which is how she has experienced these symptoms in the past. She has been seen by neurology and managed for same. Plan is to follow with them tomorrow. No new symptoms. Seen at [facility] earlier today with labs and exam, given steroids but nothing for pain.   The history is provided by the patient. No language interpreter was used.  Leg Pain Associated symptoms: no fever       Past Medical History:  Diagnosis Date   Brain lesion    COVID-19 04/2020   Diplopia    Hypertension     Patient Active Problem List   Diagnosis Date Noted   Muscle spasms of both lower extremities 08/14/2020   Fibroid uterus 08/14/2020   Anemia due to blood loss, chronic 01/10/2020   Menorrhagia with regular cycle 01/09/2020   Class 1 obesity due to excess calories with body mass index (BMI) of 34.0 to 34.9 in adult 01/09/2020   Screening breast examination 02/07/2019   Breast pain 02/07/2019   Hypertension 01/26/2019   Paresthesias 08/04/2013   Breast pain, right 03/11/2012    Past Surgical History:  Procedure Laterality Date   TUBAL LIGATION       OB History     Gravida  4   Para  4   Term  4   Preterm  0   AB  0   Living  4      SAB  0   IAB  0   Ectopic  0   Multiple  0   Live Births  4           Family History  Problem Relation Age of Onset   Healthy Mother    Cirrhosis Father    Cancer Maternal Grandmother 30       breast   Breast cancer Maternal Grandmother     Social History   Tobacco Use   Smoking status: Former    Packs/day: 0.50    Years: 4.00    Pack years: 2.00    Types: Cigarettes    Quit date: 06/04/2017     Years since quitting: 3.6   Smokeless tobacco: Never  Vaping Use   Vaping Use: Never used  Substance Use Topics   Alcohol use: No   Drug use: Yes    Types: Marijuana    Comment: 09/30/20 maybe once a week    Home Medications Prior to Admission medications   Medication Sig Start Date End Date Taking? Authorizing Provider  acetaminophen (TYLENOL) 325 MG tablet Take 2 tablets (650 mg total) by mouth every 6 (six) hours as needed for moderate pain. 02/12/21   Mickie Hillier, PA-C  acyclovir (ZOVIRAX) 200 MG capsule Take 2 capsules (400 mg total) by mouth 3 (three) times daily. Take for 5 days prn each outbreak. Patient not taking: Reported on 09/30/2020 03/18/20   Shelly Bombard, MD  amLODipine (NORVASC) 5 MG tablet Take 1 tablet (5 mg total) by mouth daily. 01/20/21   Mar Daring, PA-C  medroxyPROGESTERone (PROVERA) 10 MG tablet Take 10 mg by mouth daily. 09/30/20 hasn't started 09/18/20   [provider]  methocarbamol (ROBAXIN) 750 MG tablet Take 1 tablet (750 mg total) by mouth 4 (four) times daily. 02/12/21   Scot Jun, FNP  methylPREDNISolone (MEDROL DOSEPAK) 4 MG TBPK tablet Take by mouth taper from 4 doses each day to 1 dose and stop for 6 days. Taper over 6 days 02/12/21 02/18/21  Mickie Hillier, PA-C  metroNIDAZOLE (FLAGYL) 500 MG tablet Take 1 tablet (500 mg total) by mouth 2 (two) times daily. 02/04/21   Lamptey, Myrene Galas, MD  potassium chloride SA (KLOR-CON) 20 MEQ tablet Take 2 tablets (40 mEq total) by mouth 2 (two) times daily for 5 days. 08/14/20 08/19/20  Deno Etienne, DO  trimethoprim-polymyxin b (POLYTRIM) ophthalmic solution Place 1 drop into both eyes every 4 (four) hours. 01/20/21   Mar Daring, PA-C  hydrochlorothiazide (HYDRODIURIL) 25 MG tablet Take 1 tablet (25 mg total) by mouth daily. Must have office visit for refills 06/20/20 08/13/20  Brunetta Jeans, PA-C    Allergies    Patient has no known allergies.  Review of Systems    Review of Systems  Constitutional:  Negative for fever.  Musculoskeletal:        See HPI.  Skin:  Negative for color change.  Neurological:  Negative for weakness and numbness.   Physical Exam Updated Vital Signs BP (!) 162/100 (BP Location: Left Arm)   Pulse 73   Temp 98.4 F (36.9 C) (Oral)   Resp (!) 23   LMP  (LMP Unknown)   SpO2 99%   Physical Exam Constitutional:      General: She is not in acute distress.    Appearance: She is well-developed. She is not ill-appearing.  Cardiovascular:     Pulses: Normal pulses.  Pulmonary:     Effort: Pulmonary effort is normal.  Musculoskeletal:        General: Tenderness (Tener msuculature of the right LE without swelling) present. Normal range of motion.     Cervical back: Normal range of motion.  Skin:    General: Skin is warm and dry.  Neurological:     Mental Status: She is alert and oriented to person, place, and time.     Sensory: No sensory deficit.     Coordination: Coordination normal.     Gait: Gait normal.     Deep Tendon Reflexes: Reflexes normal.    ED Results / Procedures / Treatments   Labs (all labs ordered are listed, but only abnormal results are displayed) Labs Reviewed - No data to display  EKG None  Radiology No results found.  Procedures Procedures   Medications Ordered in ED Medications - No data to display  ED Course  I have reviewed the triage vital signs and the nursing notes.  Pertinent labs & imaging results that were available during my care of the patient were reviewed by me and considered in my medical decision making (see chart for details).    MDM Rules/Calculators/A&P                           Patient to ED with LE pain and spasms. History of same, followed by neurology. Usually better with steroids which she was prescribed earlier today. She is requesting assistance with pain control.  Database consulted, score 140. Will provide pain relief. Encouraged PCP follow up for  pain management and return to neuro for continued evaluation of source of symptoms.   Final Clinical Impression(s) / ED  Diagnoses Final diagnoses:  None   Right LE pain  Rx / DC Orders ED Discharge Orders     None        Dennie Bible 02/12/21 2232    Dorie Rank, MD 02/12/21 2243

## 2021-02-12 NOTE — Discharge Instructions (Signed)
For management of chronic pain please follow-up with either neurologist or primary care provider.

## 2021-02-12 NOTE — ED Provider Notes (Signed)
[facility] Provider Note   CSN: [id number] Arrival date & time: 02/12/21  1102     History Chief Complaint  Patient presents with   Spasms    Connie Herrera is a 45 y.o. female.  Past medical history of hypertension who presents to the emergency department with bilateral leg pain.  States this morning she woke up with "muscle spasms" in bilateral legs. She states that spasming is intermittent and severe. Spasms occur in anterior and posterior thighs as well as bilateral calves.  States at times it makes it difficult to walk.  She denies any urinary symptoms, abdominal pain, nausea/vomiting/diarrhea.  Denies any recent extraneous exercise.  Denies joint swelling.  She endorses keeping up with p.o. liquids and feels hydrated.   She states that she has been seen multiple times for this before.  She has had an extensive work-up including reassuring MRIs of the brain, cervical, thoracic, lumbar regions.  She has had negative DVT ultrasounds and plain films.  She has been prescribed Flexeril and Robaxin.  She states that the only treatment that has been useful so far has been steroids which improved her pain for "months."  She is being followed by a neurologist.  HPI     Past Medical History:  Diagnosis Date   Brain lesion    COVID-19 04/2020   Diplopia    Hypertension     Patient Active Problem List   Diagnosis Date Noted   Muscle spasms of both lower extremities 08/14/2020   Fibroid uterus 08/14/2020   Anemia due to blood loss, chronic 01/10/2020   Menorrhagia with regular cycle 01/09/2020   Class 1 obesity due to excess calories with body mass index (BMI) of 34.0 to 34.9 in adult 01/09/2020   Screening breast examination 02/07/2019   Breast pain 02/07/2019   Hypertension 01/26/2019   Paresthesias 08/04/2013   Breast pain, right 03/11/2012    Past Surgical History:  Procedure Laterality Date   TUBAL LIGATION       OB History      Gravida  4   Para  4   Term  4   Preterm  0   AB  0   Living  4      SAB  0   IAB  0   Ectopic  0   Multiple  0   Live Births  4           Family History  Problem Relation Age of Onset   Healthy Mother    Cirrhosis Father    Cancer Maternal Grandmother 41       breast   Breast cancer Maternal Grandmother     Social History   Tobacco Use   Smoking status: Former    Packs/day: 0.50    Years: 4.00    Pack years: 2.00    Types: Cigarettes    Quit date: 06/04/2017    Years since quitting: 3.6   Smokeless tobacco: Never  Vaping Use   Vaping Use: Never used  Substance Use Topics   Alcohol use: No   Drug use: Yes    Types: Marijuana    Comment: 09/30/20 maybe once a week    Home Medications Prior to Admission medications   Medication Sig Start Date End Date Taking? Authorizing Provider  acyclovir (ZOVIRAX) 200 MG capsule Take 2 capsules (400 mg total) by mouth 3 (three) times daily. Take for 5 days prn each outbreak. Patient not taking: Reported on 09/30/2020  03/18/20   Shelly Bombard, MD  amLODipine (NORVASC) 5 MG tablet Take 1 tablet (5 mg total) by mouth daily. 01/20/21   Mar Daring, PA-C  medroxyPROGESTERone (PROVERA) 10 MG tablet Take 10 mg by mouth daily. 09/30/20 hasn't started 09/18/20   [provider]  metroNIDAZOLE (FLAGYL) 500 MG tablet Take 1 tablet (500 mg total) by mouth 2 (two) times daily. 02/04/21   Lamptey, Myrene Galas, MD  potassium chloride SA (KLOR-CON) 20 MEQ tablet Take 2 tablets (40 mEq total) by mouth 2 (two) times daily for 5 days. 08/14/20 08/19/20  Deno Etienne, DO  trimethoprim-polymyxin b (POLYTRIM) ophthalmic solution Place 1 drop into both eyes every 4 (four) hours. 01/20/21   Mar Daring, PA-C  hydrochlorothiazide (HYDRODIURIL) 25 MG tablet Take 1 tablet (25 mg total) by mouth daily. Must have office visit for refills 06/20/20 08/13/20  Brunetta Jeans, PA-C    Allergies    Patient has no known  allergies.  Review of Systems   Review of Systems  Musculoskeletal:  Positive for myalgias.  All other systems reviewed and are negative.  Physical Exam Updated Vital Signs BP (!) 159/103 (BP Location: Left Arm)   Pulse 81   Temp 98.3 F (36.8 C) (Oral)   Resp 18   Ht 5\' 7"  (1.702 m)   Wt 98.9 kg   LMP  (LMP Unknown)   SpO2 100%   BMI 34.14 kg/m   Physical Exam Vitals and nursing note reviewed.  Constitutional:      General: She is not in acute distress.    Appearance: Normal appearance. She is not ill-appearing.  HENT:     Head: Normocephalic and atraumatic.     Mouth/Throat:     Mouth: Mucous membranes are moist.     Pharynx: Oropharynx is clear.  Eyes:     General: No scleral icterus.    Pupils: Pupils are equal, round, and reactive to light.  Cardiovascular:     Pulses: Normal pulses.     Heart sounds: No murmur heard. Pulmonary:     Effort: Pulmonary effort is normal. No respiratory distress.     Breath sounds: Normal breath sounds.  Abdominal:     Palpations: Abdomen is soft.  Musculoskeletal:        General: Tenderness present. No signs of injury. Normal range of motion.     Cervical back: Normal range of motion and neck supple. No rigidity or tenderness.     Right lower leg: No edema.     Left lower leg: No edema.     Comments: Tenderness to palpation of the bilateral thighs and bilateral calves.  I am unable to appreciate any active spasming of her leg muscles.  There is no joint swelling or redness.  Skin:    General: Skin is warm and dry.     Capillary Refill: Capillary refill takes less than 2 seconds.     Findings: No rash.  Neurological:     General: No focal deficit present.     Mental Status: She is alert and oriented to person, place, and time. Mental status is at baseline.  Psychiatric:        Mood and Affect: Mood normal.        Behavior: Behavior normal.        Thought Content: Thought content normal.        Judgment: Judgment normal.     ED Results / Procedures / Treatments   Labs (all labs ordered  are listed, but only abnormal results are displayed) Labs Reviewed  COMPREHENSIVE METABOLIC PANEL - Abnormal; Notable for the following components:      Result Value   Potassium 3.4 (*)    Calcium 8.6 (*)    AST 11 (*)    All other components within normal limits  CBC WITH DIFFERENTIAL/PLATELET - Abnormal; Notable for the following components:   Hemoglobin 11.5 (*)    MCV 79.0 (*)    MCH 24.9 (*)    Platelets 520 (*)    All other components within normal limits  URINALYSIS, ROUTINE W REFLEX MICROSCOPIC  PREGNANCY, URINE  CK   EKG None  Radiology No results found.  Procedures Procedures   Medications Ordered in ED Medications  methocarbamol (ROBAXIN) tablet 500 mg (500 mg Oral Given 02/12/21 1242)  HYDROcodone-acetaminophen (NORCO/VICODIN) 5-325 MG per tablet 1 tablet (1 tablet Oral Given 02/12/21 1302)    ED Course  I have reviewed the triage vital signs and the nursing notes.  Pertinent labs & imaging results that were available during my care of the patient were reviewed by me and considered in my medical decision making (see chart for details).    MDM Rules/Calculators/A&P 45 year old female who presents emergency department with bilateral lower extremity muscle spasms.  Lab work unremarkable.  Hemoglobin 11.5 which is stable from previous. CK 112 which appears improved from previous when she was seen in the emergency department She is able to ambulate without difficulty.  She has been seen in the emergency department multiple times for the same complaint.  She is followed by neurology who is doing a thorough work-up as described above includes brain and spine MRI, DVT work-up which is all been negative.  It does appear on her latest notes from neurology, Dr. Leta Baptist that she had a more recent MRI with subtle T2 hyperintensity within the left conus medullaris.  He felt that further work-up should be  completed with a lumbar puncture.  The patient endorses the story and states that she refused LP previously because she was scared.  She states that she will contact neurology to reschedule this to be completed.  She denies any fever, saddle anesthesia, lower extremity weakness, incontinence of bowel or bladder that is concerning for an acute or emergent neurological emergency.  She is given Robaxin and Norco here in the emergency department with improvement in pain in her left leg. I will discharge her with Tylenol and a Medrol Dosepak as this has been helpful for her in the past.  She is instructed to call neurology tomorrow to set up her lumbar puncture.  She is instructed with teach back to return to the emergency department should she begin having any back pain red flags. Final Clinical Impression(s) / ED Diagnoses Final diagnoses:  Muscle spasm    Rx / DC Orders ED Discharge Orders          Ordered    acetaminophen (TYLENOL) 325 MG tablet  Every 6 hours PRN        02/12/21 1408    methylPREDNISolone (MEDROL DOSEPAK) 4 MG TBPK tablet  (Dosepack) 4x daily tapering        02/12/21 1408             Mickie Hillier, PA-C 02/12/21 1413    Elnora Morrison, MD 02/12/21 1523

## 2021-02-13 ENCOUNTER — Telehealth: Payer: Self-pay | Admitting: Diagnostic Neuroimaging

## 2021-02-13 DIAGNOSIS — M79605 Pain in left leg: Secondary | ICD-10-CM

## 2021-02-13 DIAGNOSIS — G379 Demyelinating disease of central nervous system, unspecified: Secondary | ICD-10-CM

## 2021-02-13 DIAGNOSIS — M79604 Pain in right leg: Secondary | ICD-10-CM

## 2021-02-13 NOTE — Telephone Encounter (Signed)
Megan and I put the order in, please review to make sure it is accurate. Thanks

## 2021-02-13 NOTE — Telephone Encounter (Signed)
Are you willing to place order for lumbar puncher ?

## 2021-02-13 NOTE — Telephone Encounter (Signed)
Pt is asking to have her order resubmitted for her Lumbar puncture

## 2021-02-13 NOTE — Addendum Note (Signed)
Addended by: Gertie Baron D on: 02/13/2021 11:41 AM   Modules accepted: Orders

## 2021-02-20 ENCOUNTER — Ambulatory Visit
Admission: RE | Admit: 2021-02-20 | Discharge: 2021-02-20 | Disposition: A | Discharge status: Home / Self Care | Payer: No Typology Code available for payment source | Source: Ambulatory Visit | Attending: Obstetrics and Gynecology | Admitting: Obstetrics and Gynecology

## 2021-02-20 ENCOUNTER — Other Ambulatory Visit: Payer: Self-pay | Admitting: Physician Assistant

## 2021-02-20 ENCOUNTER — Other Ambulatory Visit: Payer: Self-pay

## 2021-02-20 DIAGNOSIS — Z1231 Encounter for screening mammogram for malignant neoplasm of breast: Secondary | ICD-10-CM

## 2021-02-20 DIAGNOSIS — I10 Essential (primary) hypertension: Secondary | ICD-10-CM

## 2021-03-19 ENCOUNTER — Encounter (HOSPITAL_COMMUNITY): Payer: Self-pay | Admitting: Emergency Medicine

## 2021-03-19 ENCOUNTER — Ambulatory Visit (HOSPITAL_COMMUNITY)
Admission: EM | Admit: 2021-03-19 | Discharge: 2021-03-19 | Disposition: A | Discharge status: Home / Self Care | Payer: 59 | Attending: Urgent Care | Admitting: Urgent Care

## 2021-03-19 ENCOUNTER — Other Ambulatory Visit: Payer: Self-pay

## 2021-03-19 ENCOUNTER — Other Ambulatory Visit: Payer: Self-pay | Admitting: Obstetrics

## 2021-03-19 DIAGNOSIS — N898 Other specified noninflammatory disorders of vagina: Secondary | ICD-10-CM | POA: Diagnosis not present

## 2021-03-19 DIAGNOSIS — N760 Acute vaginitis: Secondary | ICD-10-CM | POA: Insufficient documentation

## 2021-03-19 DIAGNOSIS — Z01419 Encounter for gynecological examination (general) (routine) without abnormal findings: Secondary | ICD-10-CM

## 2021-03-19 DIAGNOSIS — B9689 Other specified bacterial agents as the cause of diseases classified elsewhere: Secondary | ICD-10-CM | POA: Insufficient documentation

## 2021-03-19 MED ORDER — METRONIDAZOLE 500 MG PO TABS: 500.0000 mg | ORAL_TABLET | Freq: Two times a day (BID) | ORAL | 0 refills | Status: DC

## 2021-03-19 MED ORDER — FLUCONAZOLE 150 MG PO TABS: 150.0000 mg | ORAL_TABLET | ORAL | 0 refills | Status: DC

## 2021-03-19 NOTE — ED Provider Notes (Signed)
[facility]   MRN: [id number] DOB: 14-Jan-1976  Subjective:   Connie Herrera is a 45 y.o. female presenting for persistent vaginal itching and discharge.  She is also had intermittent lower abdominal pains.  She was diagnosed with bacterial vaginosis and was supposed to complete a course of metronidazole but admits noncompliance.  She subsequently received a steroid course and that worsened her vaginal symptoms.  She would like to be rechecked including testing for STIs.  Denies fever, nausea, vomiting, vaginal bleeding, dysuria, hematuria.  No current facility-administered medications for this encounter.  Current Outpatient Medications:    acetaminophen (TYLENOL) 325 MG tablet, Take 2 tablets (650 mg total) by mouth every 6 (six) hours as needed for moderate pain., Disp: 30 tablet, Rfl: 0   acyclovir (ZOVIRAX) 200 MG capsule, Take 2 capsules (400 mg total) by mouth 3 (three) times daily. Take for 5 days prn each outbreak. (Patient not taking: Reported on 09/30/2020), Disp: 30 capsule, Rfl: 11   amLODipine (NORVASC) 5 MG tablet, Take 1 tablet (5 mg total) by mouth daily., Disp: 30 tablet, Rfl: 0   medroxyPROGESTERone (PROVERA) 10 MG tablet, Take 10 mg by mouth daily. 09/30/20 hasn't started, Disp: , Rfl:    methocarbamol (ROBAXIN) 750 MG tablet, Take 1 tablet (750 mg total) by mouth 4 (four) times daily., Disp: 30 tablet, Rfl: 0   metroNIDAZOLE (FLAGYL) 500 MG tablet, Take 1 tablet (500 mg total) by mouth 2 (two) times daily., Disp: 14 tablet, Rfl: 0   oxyCODONE-acetaminophen (PERCOCET/ROXICET) 5-325 MG tablet, Take 1 tablet by mouth every 4 (four) hours as needed for severe pain., Disp: 10 tablet, Rfl: 0   potassium chloride SA (KLOR-CON) 20 MEQ tablet, Take 2 tablets (40 mEq total) by mouth 2 (two) times daily for 5 days., Disp: 20 tablet, Rfl: 0   trimethoprim-polymyxin b (POLYTRIM) ophthalmic solution, Place 1 drop into both eyes every 4 (four) hours., Disp: 10 mL,  Rfl: 0   No Known Allergies  Past Medical History:  Diagnosis Date   Brain lesion    COVID-19 04/2020   Diplopia    Hypertension      Past Surgical History:  Procedure Laterality Date   TUBAL LIGATION      Family History  Problem Relation Age of Onset   Healthy Mother    Cirrhosis Father    Cancer Maternal Grandmother 11       breast   Breast cancer Maternal Grandmother     Social History   Tobacco Use   Smoking status: Former    Packs/day: 0.50    Years: 4.00    Pack years: 2.00    Types: Cigarettes    Quit date: 06/04/2017    Years since quitting: 3.7   Smokeless tobacco: Never  Vaping Use   Vaping Use: Never used  Substance Use Topics   Alcohol use: No   Drug use: Yes    Types: Marijuana    Comment: 09/30/20 maybe once a week    ROS   Objective:   Vitals: BP (!) 138/95 (BP Location: Left Arm)    Pulse 81    Temp 98.3 F (36.8 C) (Oral)    Resp 18    LMP 03/10/2021    SpO2 97%   Physical Exam Constitutional:      General: She is not in acute distress.    Appearance: Normal appearance. She is well-developed. She is not ill-appearing, toxic-appearing or diaphoretic.  HENT:  Head: Normocephalic and atraumatic.     Nose: Nose normal.     Mouth/Throat:     Mouth: Mucous membranes are moist.     Pharynx: Oropharynx is clear.  Eyes:     General: No scleral icterus.       Right eye: No discharge.        Left eye: No discharge.     Extraocular Movements: Extraocular movements intact.     Conjunctiva/sclera: Conjunctivae normal.     Pupils: Pupils are equal, round, and reactive to light.  Cardiovascular:     Rate and Rhythm: Normal rate.  Pulmonary:     Effort: Pulmonary effort is normal.  Abdominal:     General: Bowel sounds are normal. There is no distension.     Palpations: Abdomen is soft. There is no mass.     Tenderness: There is abdominal tenderness (mild) in the periumbilical area and suprapubic area. There is no right CVA tenderness,  left CVA tenderness, guarding or rebound.  Skin:    General: Skin is warm and dry.  Neurological:     General: No focal deficit present.     Mental Status: She is alert and oriented to person, place, and time.  Psychiatric:        Mood and Affect: Mood normal.        Behavior: Behavior normal.        Thought Content: Thought content normal.        Judgment: Judgment normal.    Assessment and Plan :   PDMP not reviewed this encounter.  1. Vaginal discharge   2. Bacterial vaginosis   3. Acute vaginitis    Low suspicion for PID.  Emphasized need for medical compliance with her metronidazole.  We will also treat empirically for a concurrent yeast infection given her recent steroid use and current symptoms.  Labs pending. Counseled patient on potential for adverse effects with medications prescribed/recommended today, ER and return-to-clinic precautions discussed, patient verbalized understanding.    Jaynee Eagles, PA-C 03/19/21 1552

## 2021-03-19 NOTE — ED Triage Notes (Signed)
Pt reports lower abd pains for a couple days. Reports that she was treated for BV then stopped when was given a steroid and having vaginal itching and discharge.

## 2021-03-20 LAB — CERVICOVAGINAL ANCILLARY ONLY
Bacterial Vaginitis (gardnerella): NEGATIVE
Candida Glabrata: NEGATIVE
Candida Vaginitis: NEGATIVE
Chlamydia: NEGATIVE
Comment: NEGATIVE
Comment: NEGATIVE
Comment: NEGATIVE
Comment: NEGATIVE
Comment: NEGATIVE
Comment: NORMAL
Neisseria Gonorrhea: NEGATIVE
Trichomonas: NEGATIVE

## 2021-04-07 ENCOUNTER — Other Ambulatory Visit: Payer: Self-pay | Admitting: Obstetrics

## 2021-04-07 DIAGNOSIS — Z01419 Encounter for gynecological examination (general) (routine) without abnormal findings: Secondary | ICD-10-CM

## 2021-04-10 ENCOUNTER — Inpatient Hospital Stay
Admission: RE | Admit: 2021-04-10 | Discharge: 2021-04-10 | Disposition: A | Discharge status: Home / Self Care | Payer: No Typology Code available for payment source | Source: Ambulatory Visit | Attending: Diagnostic Neuroimaging | Admitting: Diagnostic Neuroimaging

## 2021-04-10 NOTE — Discharge Instructions (Signed)

## 2021-04-30 DIAGNOSIS — Z131 Encounter for screening for diabetes mellitus: Secondary | ICD-10-CM | POA: Diagnosis not present

## 2021-04-30 DIAGNOSIS — Z1329 Encounter for screening for other suspected endocrine disorder: Secondary | ICD-10-CM | POA: Diagnosis not present

## 2021-04-30 DIAGNOSIS — Z0001 Encounter for general adult medical examination with abnormal findings: Secondary | ICD-10-CM | POA: Diagnosis not present

## 2021-04-30 DIAGNOSIS — Z136 Encounter for screening for cardiovascular disorders: Secondary | ICD-10-CM | POA: Diagnosis not present

## 2021-05-05 ENCOUNTER — Other Ambulatory Visit: Payer: Self-pay

## 2021-05-05 ENCOUNTER — Ambulatory Visit
Admission: RE | Admit: 2021-05-05 | Discharge: 2021-05-05 | Disposition: A | Discharge status: Home / Self Care | Payer: 59 | Source: Ambulatory Visit | Attending: Diagnostic Neuroimaging | Admitting: Diagnostic Neuroimaging

## 2021-05-05 VITALS — BP 134/89 | HR 73

## 2021-05-05 DIAGNOSIS — G379 Demyelinating disease of central nervous system, unspecified: Secondary | ICD-10-CM

## 2021-05-05 DIAGNOSIS — M79605 Pain in left leg: Secondary | ICD-10-CM | POA: Diagnosis not present

## 2021-05-05 DIAGNOSIS — M79604 Pain in right leg: Secondary | ICD-10-CM | POA: Diagnosis not present

## 2021-05-05 DIAGNOSIS — G35 Multiple sclerosis: Secondary | ICD-10-CM | POA: Diagnosis not present

## 2021-05-05 NOTE — Progress Notes (Signed)
1 vial of blood drawn from pts RAC to be sent off with LP lab work. 1 successful attempt, pt tolerated well. Gauze and tape applied after.

## 2021-05-05 NOTE — Discharge Instructions (Signed)

## 2021-05-06 DIAGNOSIS — R7303 Prediabetes: Secondary | ICD-10-CM | POA: Diagnosis not present

## 2021-05-06 DIAGNOSIS — E785 Hyperlipidemia, unspecified: Secondary | ICD-10-CM | POA: Diagnosis not present

## 2021-05-06 DIAGNOSIS — D649 Anemia, unspecified: Secondary | ICD-10-CM | POA: Diagnosis not present

## 2021-05-06 DIAGNOSIS — Z720 Tobacco use: Secondary | ICD-10-CM | POA: Diagnosis not present

## 2021-05-06 DIAGNOSIS — Z6830 Body mass index (BMI) 30.0-30.9, adult: Secondary | ICD-10-CM | POA: Diagnosis not present

## 2021-05-06 DIAGNOSIS — E6609 Other obesity due to excess calories: Secondary | ICD-10-CM | POA: Diagnosis not present

## 2021-05-08 ENCOUNTER — Telehealth: Payer: Self-pay | Admitting: Diagnostic Neuroimaging

## 2021-05-08 NOTE — Telephone Encounter (Signed)
Patient called in requesting someone speak with her regarding results from Lumbar Puncture. I let her know we would be giving her a call back regarding this as soon as Dr. Leta Baptist has reviewed them.

## 2021-05-10 LAB — CNS IGG SYNTHESIS RATE, CSF+BLOOD
Albumin Serum: 3.8 g/dL (ref 3.6–5.1)
Albumin, CSF: 14.1 mg/dL (ref 8.0–42.0)
CNS-IgG Synthesis Rate: -4.9 mg/24 h (ref <=3.3)
IgG (Immunoglobin G), Serum: 1350 mg/dL (ref 600–1640)
IgG Total CSF: 2.3 mg/dL (ref 0.8–7.7)
IgG-Index: 0.46 (ref <0.70)

## 2021-05-10 LAB — CSF CULTURE W GRAM STAIN
MICRO NUMBER:: 12936652
Result:: NO GROWTH
SPECIMEN QUALITY:: ADEQUATE

## 2021-05-10 LAB — PROTEIN, CSF: Total Protein, CSF: 23 mg/dL (ref 15–45)

## 2021-05-10 LAB — GLUCOSE, CSF: Glucose, CSF: 71 mg/dL (ref 40–80)

## 2021-05-10 LAB — OLIGOCLONAL BANDS, CSF + SERM

## 2021-05-10 LAB — CSF CELL COUNT WITH DIFFERENTIAL
RBC Count, CSF: 0 cells/uL
WBC, CSF: 0 cells/uL (ref 0–5)

## 2021-05-12 NOTE — Telephone Encounter (Signed)
Received fax from [facility] re: LP lab results. Placed on MD desk for review.

## 2021-05-13 ENCOUNTER — Encounter: Payer: Self-pay | Admitting: *Deleted

## 2021-05-13 DIAGNOSIS — R7303 Prediabetes: Secondary | ICD-10-CM | POA: Diagnosis not present

## 2021-05-13 DIAGNOSIS — E785 Hyperlipidemia, unspecified: Secondary | ICD-10-CM | POA: Diagnosis not present

## 2021-05-13 DIAGNOSIS — Z720 Tobacco use: Secondary | ICD-10-CM | POA: Diagnosis not present

## 2021-05-13 DIAGNOSIS — M791 Myalgia, unspecified site: Secondary | ICD-10-CM | POA: Diagnosis not present

## 2021-05-13 DIAGNOSIS — D649 Anemia, unspecified: Secondary | ICD-10-CM | POA: Diagnosis not present

## 2021-05-13 DIAGNOSIS — E6609 Other obesity due to excess calories: Secondary | ICD-10-CM | POA: Diagnosis not present

## 2021-05-13 DIAGNOSIS — Z6830 Body mass index (BMI) 30.0-30.9, adult: Secondary | ICD-10-CM | POA: Diagnosis not present

## 2021-05-13 NOTE — Telephone Encounter (Signed)
Unable to reach patient, no answer, voice MB not set up. Sent her my chart re: Her LP labs are normal.

## 2021-05-21 ENCOUNTER — Encounter: Payer: Self-pay | Admitting: *Deleted

## 2021-06-03 DIAGNOSIS — Z720 Tobacco use: Secondary | ICD-10-CM | POA: Diagnosis not present

## 2021-06-03 DIAGNOSIS — D508 Other iron deficiency anemias: Secondary | ICD-10-CM | POA: Diagnosis not present

## 2021-06-03 DIAGNOSIS — Z6830 Body mass index (BMI) 30.0-30.9, adult: Secondary | ICD-10-CM | POA: Diagnosis not present

## 2021-06-03 DIAGNOSIS — E538 Deficiency of other specified B group vitamins: Secondary | ICD-10-CM | POA: Diagnosis not present

## 2021-06-03 DIAGNOSIS — M791 Myalgia, unspecified site: Secondary | ICD-10-CM | POA: Diagnosis not present

## 2021-06-03 DIAGNOSIS — E785 Hyperlipidemia, unspecified: Secondary | ICD-10-CM | POA: Diagnosis not present

## 2021-06-03 DIAGNOSIS — R7303 Prediabetes: Secondary | ICD-10-CM | POA: Diagnosis not present

## 2021-06-03 DIAGNOSIS — E6609 Other obesity due to excess calories: Secondary | ICD-10-CM | POA: Diagnosis not present

## 2021-06-09 IMAGING — US US PELVIS COMPLETE WITH TRANSVAGINAL
1 series · 14 of 25 positions shown · non-contrast
Comparison: None

CLINICAL DATA: Menorrhagia

EXAM:
TRANSABDOMINAL AND TRANSVAGINAL ULTRASOUND OF PELVIS
TECHNIQUE: Both transabdominal and transvaginal ultrasound examinations of the
pelvis were performed. Transabdominal technique was performed for
global imaging of the pelvis including uterus, ovaries, adnexal
regions, and pelvic cul-de-sac. It was necessary to proceed with
endovaginal exam following the transabdominal exam to visualize the
endometrial stripe.

[Series 1: us pelvis complete with transvaginal · 14 of 150 slices shown]
[im 1/150]
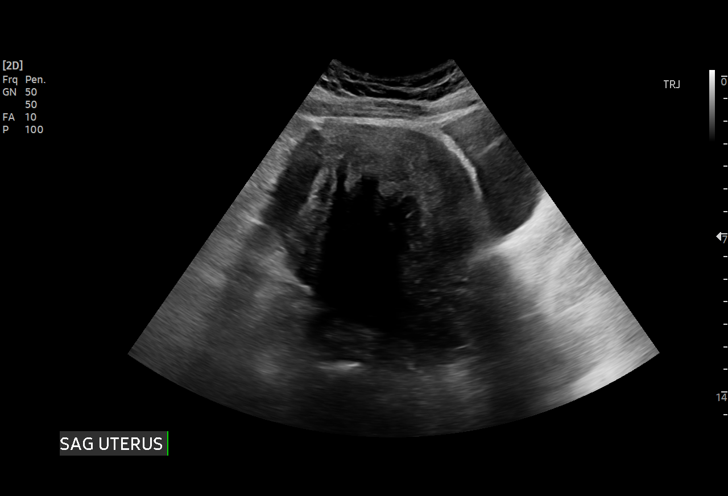
[im 13/150]
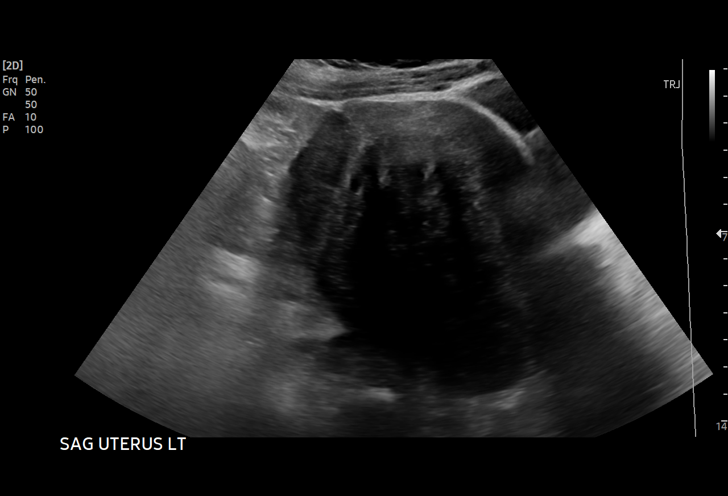
[im 25/150]
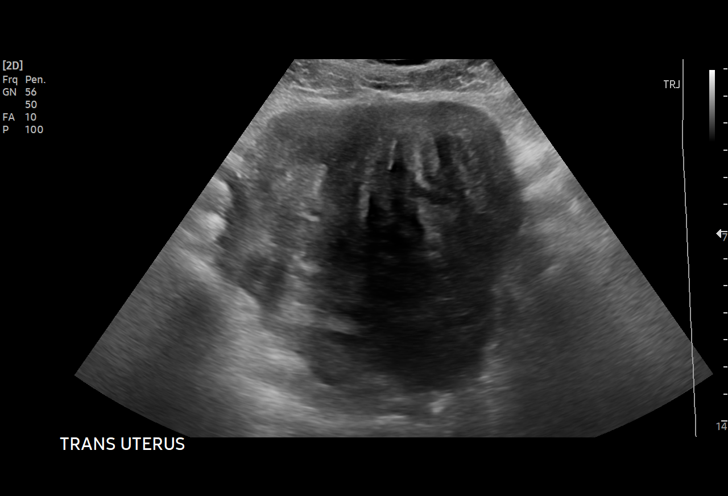
[im 38/150]
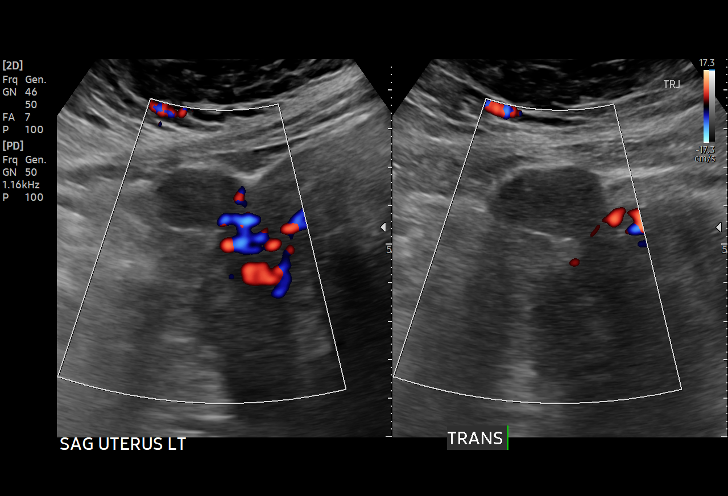
[im 50/150]
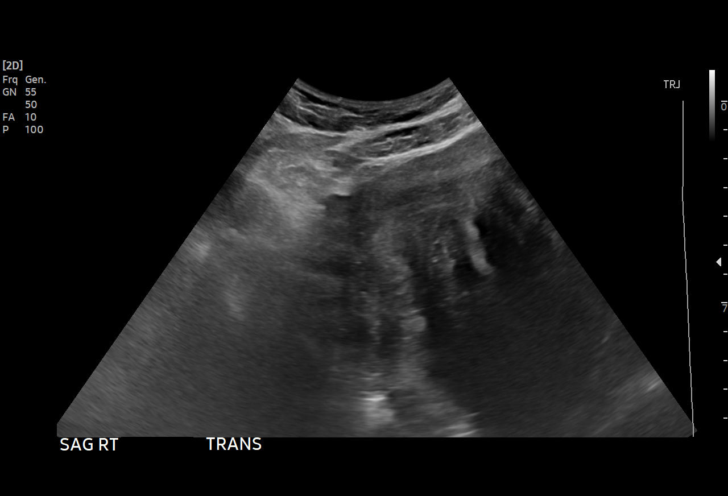
[im 56/150]
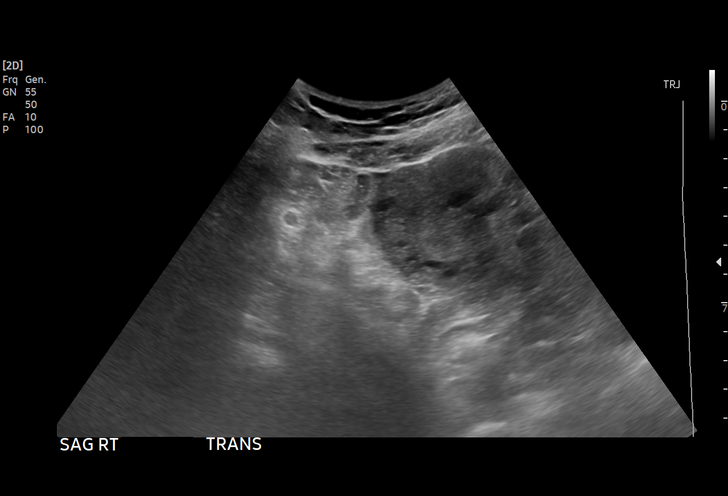
[im 69/150]
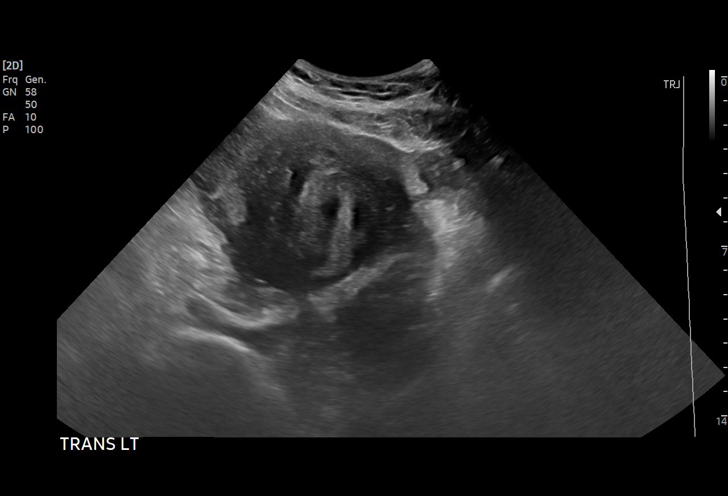
[im 81/150]
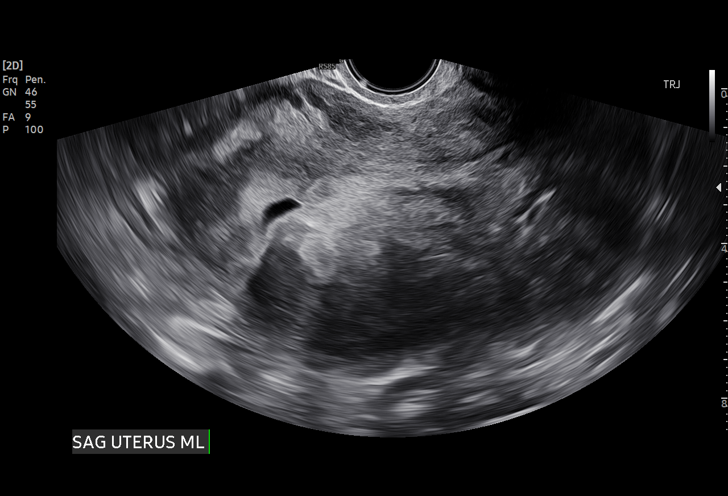
[im 94/150]
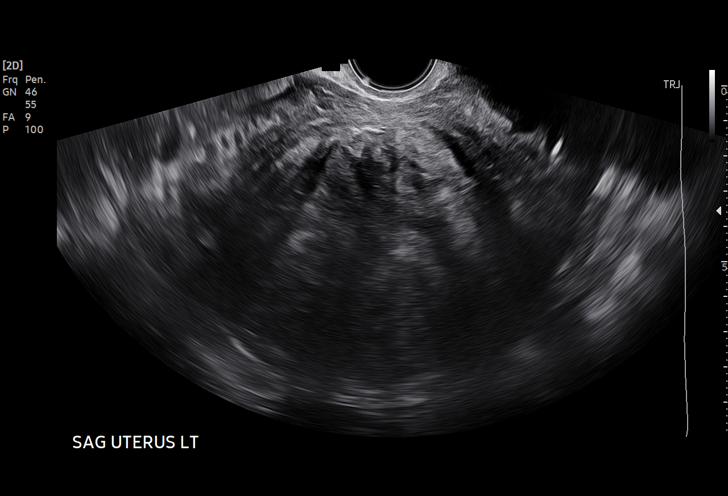
[im 100/150]
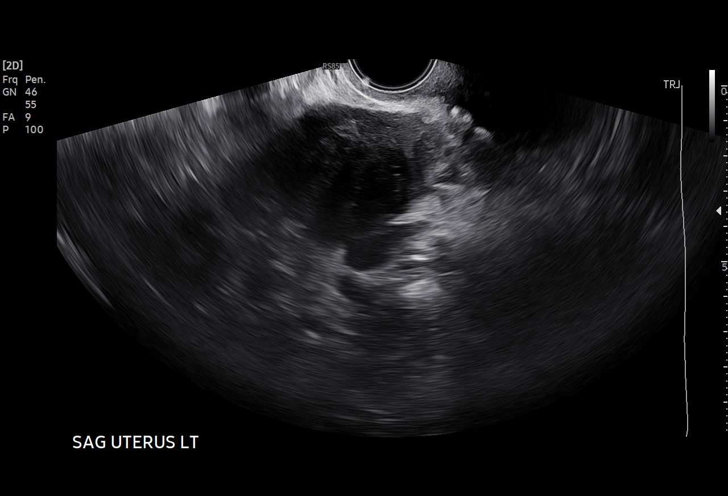
[im 112/150]
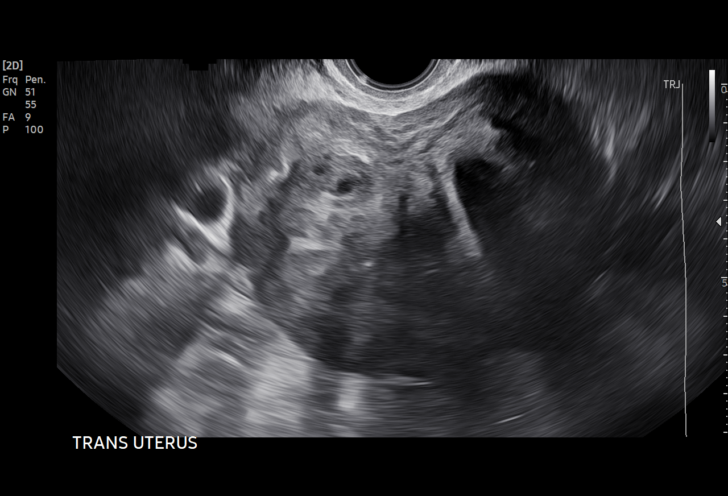
[im 125/150]
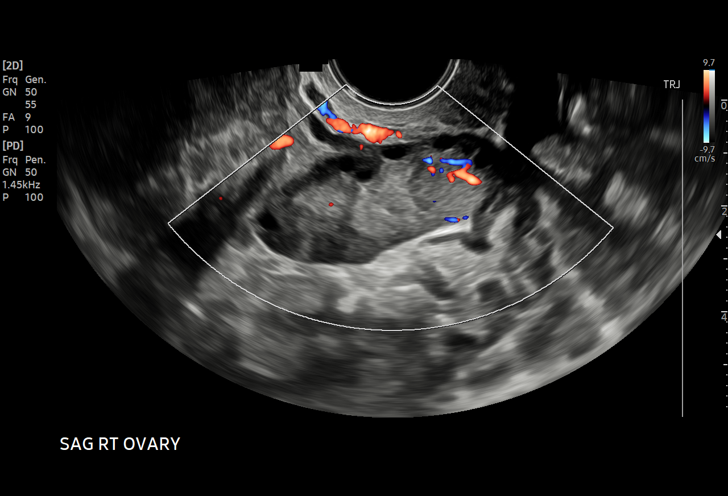
[im 137/150]
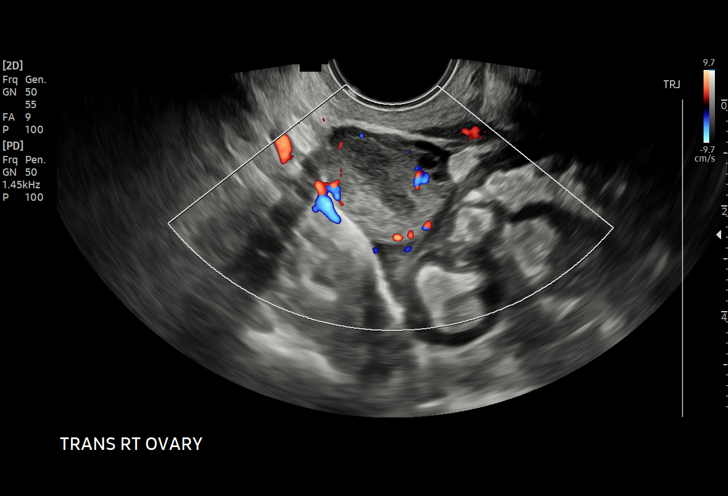
[im 150/150]
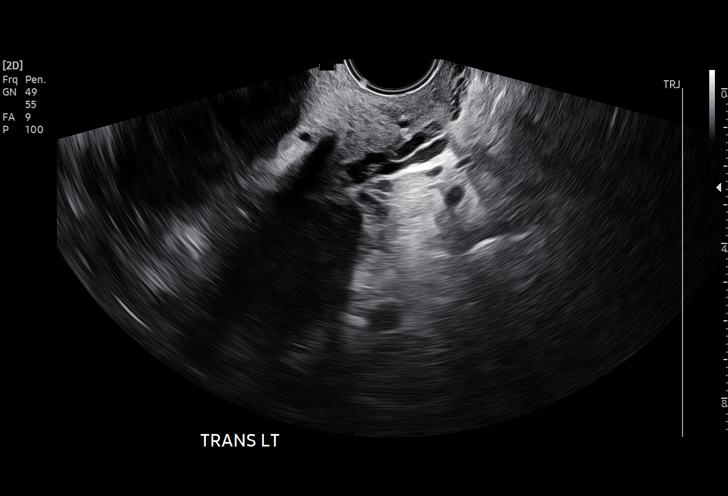

[14 of 25 positions shown; findings below may reference images not displayed]

FINDINGS: Uterus

Measurements: At least 12.6 x 0.7 x 12.1 cm = volume: 697 mL. There
are multiple intrauterine masses identified. The largest is seen
within the left uterine corpus measuring at least 8.8 x 7.6 x 9.0 cm
in size. These heterogeneously hypoechoic lobulated masses are
compatible with multiple intrauterine fibroids. A submucosal 2.6 cm
fibroid is seen projecting into the endometrial cavity. The
endometrial cavity is deviated into the right uterus and appears
slightly distorted secondary to mass effect from multiple uterine
fibroids. The cervix is unremarkable.

Endometrium

Thickness: 14 mm. There is fluid seen within the endometrial cavity
measuring up to 4 mm in thickness.

Right ovary

Measurements: 4.9 x 2.2 x 2.8 cm = volume: 16 mL. Multiple follicles
are seen within the right ovary. A probable involuting corpus luteum
is seen within the right ovary. No solid masses are identified. No
adnexal masses are seen.

Left ovary

Not clearly identified. There is suggestion of a normal appearing
left ovary seen transabdominally, best on image # 61 and 62,
however, this is not definitive. No adnexal masses are identified.

Other findings

There is trace free fluid seen within the pelvis surrounding the
right ovary
IMPRESSION: Fibroid uterus with numerous fibroids with several noted to be
submucosal and distorting the endometrial stripe.

Small fluid within the endometrial cavity. This may relate to the
patient's phase of menstruation and a follow-up sonogram in 6-12
weeks, in the medial post menstrual phase, may be helpful in
documenting resolution.

No definite visualization of the left ovary.  No adnexal masses.

## 2021-07-16 ENCOUNTER — Other Ambulatory Visit: Payer: Self-pay

## 2021-07-16 ENCOUNTER — Encounter (HOSPITAL_COMMUNITY): Payer: Self-pay | Admitting: *Deleted

## 2021-07-16 ENCOUNTER — Ambulatory Visit (HOSPITAL_COMMUNITY)
Admission: EM | Admit: 2021-07-16 | Discharge: 2021-07-16 | Disposition: A | Discharge status: Home / Self Care | Payer: 59 | Attending: Internal Medicine | Admitting: Internal Medicine

## 2021-07-16 DIAGNOSIS — N898 Other specified noninflammatory disorders of vagina: Secondary | ICD-10-CM | POA: Diagnosis not present

## 2021-07-16 DIAGNOSIS — R234 Changes in skin texture: Secondary | ICD-10-CM | POA: Insufficient documentation

## 2021-07-16 DIAGNOSIS — Z113 Encounter for screening for infections with a predominantly sexual mode of transmission: Secondary | ICD-10-CM | POA: Diagnosis present

## 2021-07-16 DIAGNOSIS — I10 Essential (primary) hypertension: Secondary | ICD-10-CM | POA: Insufficient documentation

## 2021-07-16 LAB — POCT URINALYSIS DIPSTICK, ED / UC
Bilirubin Urine: NEGATIVE
Glucose, UA: NEGATIVE mg/dL
Hgb urine dipstick: NEGATIVE
Ketones, ur: NEGATIVE mg/dL
Nitrite: NEGATIVE
Protein, ur: NEGATIVE mg/dL
Specific Gravity, Urine: 1.025 (ref 1.005–1.030)
Urobilinogen, UA: 1 mg/dL (ref 0.0–1.0)
pH: 6.5 (ref 5.0–8.0)

## 2021-07-16 LAB — POC URINE PREG, ED: Preg Test, Ur: NEGATIVE

## 2021-07-16 MED ORDER — AMLODIPINE BESYLATE 5 MG PO TABS: 5.0000 mg | ORAL_TABLET | Freq: Every day | ORAL | 0 refills | Status: DC

## 2021-07-16 NOTE — ED Triage Notes (Signed)
Reports Lt eye is draining ,pt also reports vag. Discharge. Pt also reports UTI sx's. ?

## 2021-07-16 NOTE — Discharge Instructions (Addendum)
Your urine did not show obvious signs of infection.  We sent this for a culture to make sure there isn't one.  If there is one, someone will call you and we will treat you if needed.  Your pregnancy test was negative.  The results of your vaginal swab will likely come back tomorrow and someone will call and send you medicine if needed. ? ?Your eye looks fine today. It may be that you had some discharge this morning from allergies.  If worsening, painful, or you develop fever, make sure you are seen by a medial provider right away. ? ?If you have severe pelvic pain, vomiting, fever, back pain, you should also be seen by a medical provider right away. ?

## 2021-07-16 NOTE — ED Provider Notes (Signed)
?[facility] ? ? ? ?CSN: [id number] ?Arrival date & time: 07/16/21  1109 ? ? ?  ? ?History   ?Chief Complaint ?Chief Complaint  ?Patient presents with  ? Abdominal Pain  ? Vaginal Discharge  ? Eye Drainage  ? ? ?HPI ?Connie Herrera is a 46 y.o. female.  ? ?Vaginal Discharge ?Discharge started few days ago ?She denies vaginal odors ?She denies vaginal pruritis, abnormal vaginal bleeding, dysuria, hematuria, pelvic pain, nausea, vomiting, fevers ?Reports some mild suprapubic discomfort that she attributes to her fibroids ?Contraception: none. ?LMP 3/20   ?Desires HIV/RPR: no ? ?Reports that she would like to be treated for urinary infection because of the discomfort ?She denies any dysuria, hematuria, flank pain, vomiting, fevers ? ?Also reports that this morning when she woke up her left leg had drainage ?States it is now gone ?Otherwise been feeling well other than above complaints ?Reports normal vision ?No eye pain ? ?Patient also reports that she has been off her blood pressure medicine for a few days ?She does have a primary care provider but has not seen them recently ?She denies any chest pain or difficulty breathing ?  ? ? ?Past Medical History:  ?Diagnosis Date  ? Brain lesion   ? COVID-19 04/2020  ? Diplopia   ? Hypertension   ? ? ?Patient Active Problem List  ? Diagnosis Date Noted  ? Muscle spasms of both lower extremities 08/14/2020  ? Fibroid uterus 08/14/2020  ? Anemia due to blood loss, chronic 01/10/2020  ? Menorrhagia with regular cycle 01/09/2020  ? Class 1 obesity due to excess calories with body mass index (BMI) of 34.0 to 34.9 in adult 01/09/2020  ? Screening breast examination 02/07/2019  ? Breast pain 02/07/2019  ? Hypertension 01/26/2019  ? Paresthesias 08/04/2013  ? Breast pain, right 03/11/2012  ? ? ?Past Surgical History:  ?Procedure Laterality Date  ? TUBAL LIGATION    ? ? ?OB History   ? ? Gravida  ?4  ? Para  ?4  ? Term  ?4  ? Preterm  ?0  ? AB  ?0  ? Living  ?4  ?  ? ?  SAB  ?0  ? IAB  ?0  ? Ectopic  ?0  ? Multiple  ?0  ? Live Births  ?4  ?   ?  ?  ? ? ? ?Home Medications   ? ?Prior to Admission medications   ?Medication Sig Start Date End Date Taking? Authorizing Provider  ?acetaminophen (TYLENOL) 325 MG tablet Take 2 tablets (650 mg total) by mouth every 6 (six) hours as needed for moderate pain. 02/12/21   Mickie Hillier, PA-C  ?acyclovir (ZOVIRAX) 200 MG capsule TAKE 2 CAPSULES BY MOUTH 3 TIMES A DAY FOR 5 DAYS AS NEEDED EACH OUTBREAK 04/08/21   Shelly Bombard, MD  ?amLODipine (NORVASC) 5 MG tablet Take 1 tablet (5 mg total) by mouth daily. 07/16/21   Teniola Tseng, Bernita Raisin, DO  ?fluconazole (DIFLUCAN) 150 MG tablet Take 1 tablet (150 mg total) by mouth once a week. 03/19/21   Jaynee Eagles, PA-C  ?medroxyPROGESTERone (PROVERA) 10 MG tablet Take 10 mg by mouth daily. 09/30/20 hasn't started 09/18/20   [provider]  ?methocarbamol (ROBAXIN) 750 MG tablet Take 1 tablet (750 mg total) by mouth 4 (four) times daily. 02/12/21   Scot Jun, FNP  ?metroNIDAZOLE (FLAGYL) 500 MG tablet Take 1 tablet (500 mg total) by mouth 2 (two) times daily. 03/19/21   Bess Harvest,  Freida Busman, PA-C  ?oxyCODONE-acetaminophen (PERCOCET/ROXICET) 5-325 MG tablet Take 1 tablet by mouth every 4 (four) hours as needed for severe pain. 02/12/21   Charlann Lange, PA-C  ?potassium chloride SA (KLOR-CON) 20 MEQ tablet Take 2 tablets (40 mEq total) by mouth 2 (two) times daily for 5 days. 08/14/20 08/19/20  Deno Etienne, DO  ?trimethoprim-polymyxin b (POLYTRIM) ophthalmic solution Place 1 drop into both eyes every 4 (four) hours. 01/20/21   Mar Daring, PA-C  ?hydrochlorothiazide (HYDRODIURIL) 25 MG tablet Take 1 tablet (25 mg total) by mouth daily. Must have office visit for refills 06/20/20 08/13/20  Brunetta Jeans, PA-C  ? ? ?Family History ?Family History  ?Problem Relation Age of Onset  ? Healthy Mother   ? Cirrhosis Father   ? Cancer Maternal Grandmother 76  ?     breast  ? Breast cancer Maternal  Grandmother   ? ? ?Social History ?Social History  ? ?Tobacco Use  ? Smoking status: Former  ?  Packs/day: 0.50  ?  Years: 4.00  ?  Pack years: 2.00  ?  Types: Cigarettes  ?  Quit date: 06/04/2017  ?  Years since quitting: 4.1  ? Smokeless tobacco: Never  ?Vaping Use  ? Vaping Use: Never used  ?Substance Use Topics  ? Alcohol use: No  ? Drug use: Yes  ?  Types: Marijuana  ?  Comment: 09/30/20 maybe once a week  ? ? ? ?Allergies   ?Patient has no known allergies. ? ? ?Review of Systems ?Review of Systems  ?All other systems reviewed and are negative. ?Per HPI ? ?Physical Exam ?Triage Vital Signs ?ED Triage Vitals  ?Enc Vitals Group  ?   BP 07/16/21 1310 (!) 156/101  ?   Pulse Rate 07/16/21 1310 90  ?   Resp 07/16/21 1310 20  ?   Temp 07/16/21 1310 98.2 ?F (36.8 ?C)  ?   Temp src --   ?   SpO2 07/16/21 1310 98 %  ?   Weight --   ?   Height --   ?   Head Circumference --   ?   Peak Flow --   ?   Pain Score 07/16/21 1308 6  ?   Pain Loc --   ?   Pain Edu? --   ?   Excl. in [place]? --   ? ?No data found. ? ?Updated Vital Signs ?BP (!) 156/101   Pulse 90   Temp 98.2 ?F (36.8 ?C)   Resp 20   SpO2 98%  ? ?Visual Acuity ?Right Eye Distance:   ?Left Eye Distance:   ?Bilateral Distance:   ? ?Right Eye Near:   ?Left Eye Near:    ?Bilateral Near:    ? ?Physical Exam ?Constitutional:   ?   General: She is not in acute distress. ?   Appearance: Normal appearance. She is not ill-appearing.  ?HENT:  ?   Head: Normocephalic and atraumatic. No right periorbital erythema or left periorbital erythema.  ?Eyes:  ?   General: Lids are normal. Lids are everted, no foreign bodies appreciated. Vision grossly intact.     ?   Right eye: No discharge.     ?   Left eye: No discharge.  ?   Extraocular Movements:  ?   Right eye: Normal extraocular motion.  ?   Left eye: Normal extraocular motion.  ?   Conjunctiva/sclera: Conjunctivae normal.  ?   Right eye: Right conjunctiva is not injected.  ?  Left eye: Left conjunctiva is not injected.   ?Cardiovascular:  ?   Rate and Rhythm: Normal rate.  ?Pulmonary:  ?   Effort: Pulmonary effort is normal. No respiratory distress.  ?Abdominal:  ?   Palpations: Abdomen is soft.  ?   Tenderness: There is no abdominal tenderness. There is no right CVA tenderness or left CVA tenderness.  ?Musculoskeletal:  ?   Cervical back: Normal range of motion.  ?Skin: ?   General: Skin is warm and dry.  ?Neurological:  ?   Mental Status: She is alert and oriented to person, place, and time.  ?Psychiatric:     ?   Mood and Affect: Mood normal.     ?   Behavior: Behavior normal.  ? ? ? ?UC Treatments / Results  ?Labs ?(all labs ordered are listed, but only abnormal results are displayed) ?Labs Reviewed  ?POCT URINALYSIS DIPSTICK, ED / UC - Abnormal; Notable for the following components:  ?    Result Value  ? Leukocytes,Ua TRACE (*)   ? All other components within normal limits  ?URINE CULTURE  ?POC URINE PREG, ED  ?CERVICOVAGINAL ANCILLARY ONLY  ? ? ?EKG ? ? ?Radiology ?No results found. ? ?Procedures ?Procedures (including critical care time) ? ?Medications Ordered in UC ?Medications - No data to display ? ?Initial Impression / Assessment and Plan / UC Course  ?I have reviewed the triage vital signs and the nursing notes. ? ?Pertinent labs & imaging results that were available during my care of the patient were reviewed by me and considered in my medical decision making (see chart for details). ? ?  ? ?Eyes normal on examination today, provided reassurance.  Could consider this could be some allergic, no findings on examination today to suggest any kind of conjunctivitis.  Vaginal swab obtained, will treat based off of results.  UA with trace leuks, otherwise WNL.  Would hold off on treatment currently and wait on culture results to determine need for treatment.  Urine pregnancy negative.  Given return precautions, see AVS. ? ?1 month refill provided for patient's amlodipine.  Recommended follow-up with primary care provider for  continued monitoring of blood pressure. ? ?Final Clinical Impressions(s) / UC Diagnoses  ? ?Final diagnoses:  ?Crusting of skin of eyelid  ?Vaginal discharge  ?Screen for sexually transmitted diseases  ?Essential hypert

## 2021-07-17 LAB — CERVICOVAGINAL ANCILLARY ONLY
Bacterial Vaginitis (gardnerella): NEGATIVE
Candida Glabrata: NEGATIVE
Candida Vaginitis: NEGATIVE
Chlamydia: NEGATIVE
Comment: NEGATIVE
Comment: NEGATIVE
Comment: NEGATIVE
Comment: NEGATIVE
Comment: NEGATIVE
Comment: NORMAL
Neisseria Gonorrhea: NEGATIVE
Trichomonas: NEGATIVE

## 2021-09-08 ENCOUNTER — Telehealth: Payer: Self-pay

## 2021-09-08 NOTE — Telephone Encounter (Addendum)
Patient returned call. Patient stated that she is having some vaginal bleeding/menses that lasts half of every month. Patient states she has history of fibroids, and needs to be able to function on her new job, as at time she feels lightheaded. Patient asked about coming to [facility]. Patient informed because she has insurance, would not be eligible for [facility], and [facility] could not help with vaginal bleeding/fibroids. Patient informed she needs to see gynecologist, previously seen by [facility], which is located in the [facility], same location as [facility]. Patient was transferred to [facility].   Attempted to return patient's call, was not able to leave a message, received recording stating voicemail box has not been set up.

## 2021-09-08 NOTE — Telephone Encounter (Signed)
Error

## 2021-09-24 ENCOUNTER — Encounter (HOSPITAL_COMMUNITY): Payer: Self-pay

## 2021-09-24 ENCOUNTER — Ambulatory Visit (HOSPITAL_COMMUNITY)
Admission: EM | Admit: 2021-09-24 | Discharge: 2021-09-24 | Disposition: A | Discharge status: Home / Self Care | Payer: 59 | Attending: Internal Medicine | Admitting: Internal Medicine

## 2021-09-24 DIAGNOSIS — N760 Acute vaginitis: Secondary | ICD-10-CM

## 2021-09-24 DIAGNOSIS — N3001 Acute cystitis with hematuria: Secondary | ICD-10-CM

## 2021-09-24 DIAGNOSIS — L989 Disorder of the skin and subcutaneous tissue, unspecified: Secondary | ICD-10-CM

## 2021-09-24 DIAGNOSIS — L299 Pruritus, unspecified: Secondary | ICD-10-CM

## 2021-09-24 LAB — POCT URINALYSIS DIPSTICK, ED / UC
Bilirubin Urine: NEGATIVE
Glucose, UA: NEGATIVE mg/dL
Ketones, ur: NEGATIVE mg/dL
Nitrite: NEGATIVE
Protein, ur: NEGATIVE mg/dL
Specific Gravity, Urine: 1.02 (ref 1.005–1.030)
Urobilinogen, UA: 1 mg/dL (ref 0.0–1.0)
pH: 7 (ref 5.0–8.0)

## 2021-09-24 MED ORDER — CLOTRIMAZOLE-BETAMETHASONE 1-0.05 % EX CREA: TOPICAL_CREAM | CUTANEOUS | 0 refills | Status: DC

## 2021-09-24 MED ORDER — METRONIDAZOLE 500 MG PO TABS: 500.0000 mg | ORAL_TABLET | Freq: Two times a day (BID) | ORAL | 0 refills | Status: DC

## 2021-09-24 MED ORDER — NITROFURANTOIN MONOHYD MACRO 100 MG PO CAPS: 100.0000 mg | ORAL_CAPSULE | Freq: Two times a day (BID) | ORAL | 0 refills | Status: DC

## 2021-09-24 NOTE — Discharge Instructions (Addendum)
You have a urinary tract infection. The vaginal discharge could be from a bacterial infection or STD. I have started you on two medications for now. Urine cultures as well as testing for gonorrhea, chlamydia, trichomonas, yeast and bacterial vaginosis is pending. You should not have any sexual activity until you receive the results of the tests. You will only be notified for positive results. You may go online to [place] and review your results. Practice safe sex practices by wearing a condom every time you have sex. Remember that people who have STIs may not experience any symptoms. However, even without symptoms, these infections can be spread from person to person and require treatment. STIs can be treated, and many STIs can be cured. However, some STIs cannot be cured and will affect you for the rest of your life. It's important to be checked regularly for STIs. You should also consider taking pre-exposure prophylaxis (PrEP) to prevent HIV infection. I have also prescribed a cream for you to use on the area that itches. Apply this twice a day for up to two weeks. You may also take benadryl as needed for itching. Keep the skin moisturized with a fragrance free lotion/cream. Follow-up with dermatology. I provided information to [facility]. You will need to call to arrange an appointment.

## 2021-09-24 NOTE — ED Triage Notes (Signed)
Pt presents with c/o lower abdominal pain and vaginal discharge x3 days. Pt also endorses rt lower leg itching x 2 weeks.

## 2021-09-24 NOTE — ED Provider Notes (Signed)
[facility]    CSN: [id number] Arrival date & time: 09/24/21  1544      History   Chief Complaint Chief Complaint  Patient presents with   Abdominal Pain   Vaginal Discharge   leg itching    HPI Connie Herrera is a 46 y.o. female.   Subjective:   Connie Herrera is a 47 y.o. female who presents for evaluation of vaginal discharge, low back pain and abdominal pain located in the suprapubic region that is described as pressure-like without radiation. The discharge is yellow, thick, and odorless. Symptoms have been present for 3 days. She denies any fevers, chills, nausea, vomiting, dysuria, urinary frequency, flank pain, vaginal sores, vaginal itching or vaginal irritation. Patient reports regular menstrual pattern. LMP 09/20/21. She is not on any contraception. She is sexually active with 1 female partner. She denies any concerns for STD exposure.   Connie Herrera also presents for evaluation of discoloration and itching to an area on the right lower leg. This area of discoloration has been present over a year after sustaining a spider bite to the area. The area hasn't caused any issues up until about a month ago when she noticed severe itching to the area. No redness, swelling or drainage. No improvement in symptoms despite over-the-counter topical agents. Patient has not had previous evaluation of this area.   The following portions of the patient's history were reviewed and updated as appropriate: allergies, current medications, past family history, past medical history, past social history, past surgical history, and problem list.          Past Medical History:  Diagnosis Date   Brain lesion    COVID-19 04/2020   Diplopia    Hypertension     Patient Active Problem List   Diagnosis Date Noted   Muscle spasms of both lower extremities 08/14/2020   Fibroid uterus 08/14/2020   Anemia due to blood loss, chronic 01/10/2020   Menorrhagia with regular  cycle 01/09/2020   Class 1 obesity due to excess calories with body mass index (BMI) of 34.0 to 34.9 in adult 01/09/2020   Screening breast examination 02/07/2019   Breast pain 02/07/2019   Hypertension 01/26/2019   Paresthesias 08/04/2013   Breast pain, right 03/11/2012    Past Surgical History:  Procedure Laterality Date   TUBAL LIGATION      OB History     Gravida  4   Para  4   Term  4   Preterm  0   AB  0   Living  4      SAB  0   IAB  0   Ectopic  0   Multiple  0   Live Births  4            Home Medications    Prior to Admission medications   Medication Sig Start Date End Date Taking? Authorizing Provider  clotrimazole-betamethasone (LOTRISONE) cream Apply to affected area 2 times daily for up to 2 weeks 09/24/21  Yes Enrique Sack, FNP  metroNIDAZOLE (FLAGYL) 500 MG tablet Take 1 tablet (500 mg total) by mouth 2 (two) times daily. 09/24/21  Yes Enrique Sack, FNP  nitrofurantoin, macrocrystal-monohydrate, (MACROBID) 100 MG capsule Take 1 capsule (100 mg total) by mouth 2 (two) times daily. 09/24/21  Yes Enrique Sack, FNP  hydrochlorothiazide (HYDRODIURIL) 25 MG tablet Take 1 tablet (25 mg total) by mouth daily. Must have office visit for refills 06/20/20 08/13/20  Brunetta Jeans, PA-C  Family History Family History  Problem Relation Age of Onset   Healthy Mother    Cirrhosis Father    Cancer Maternal Grandmother 41       breast   Breast cancer Maternal Grandmother     Social History Social History   Tobacco Use   Smoking status: Former    Packs/day: 0.50    Years: 4.00    Total pack years: 2.00    Types: Cigarettes    Quit date: 06/04/2017    Years since quitting: 4.3   Smokeless tobacco: Never  Vaping Use   Vaping Use: Never used  Substance Use Topics   Alcohol use: No   Drug use: Yes    Types: Marijuana    Comment: 09/30/20 maybe once a week     Allergies   Patient has no known allergies.   Review of  Systems Review of Systems  Constitutional:  Negative for fever.  Gastrointestinal:  Positive for abdominal pain. Negative for nausea.  Genitourinary:  Positive for vaginal discharge. Negative for dysuria, flank pain, frequency and genital sores.  Musculoskeletal:  Positive for back pain.  Skin:  Positive for rash.  All other systems reviewed and are negative.    Physical Exam Triage Vital Signs ED Triage Vitals  Enc Vitals Group     BP 09/24/21 1628 (!) 148/88     Pulse Rate 09/24/21 1628 84     Resp 09/24/21 1628 14     Temp 09/24/21 1628 98 F (36.7 C)     Temp Source 09/24/21 1628 Oral     SpO2 09/24/21 1628 97 %     Weight --      Height --      Head Circumference --      Peak Flow --      Pain Score 09/24/21 1630 6     Pain Loc --      Pain Edu? --      Excl. in [place]? --    No data found.  Updated Vital Signs BP (!) 148/88 (BP Location: Left Arm)   Pulse 84   Temp 98 F (36.7 C) (Oral)   Resp 14   SpO2 97%   Visual Acuity Right Eye Distance:   Left Eye Distance:   Bilateral Distance:    Right Eye Near:   Left Eye Near:    Bilateral Near:     Physical Exam Vitals reviewed.  Constitutional:      General: She is not in acute distress.    Appearance: She is well-developed. She is not ill-appearing, toxic-appearing or diaphoretic.  HENT:     Head: Normocephalic.     Mouth/Throat:     Mouth: Mucous membranes are moist.  Cardiovascular:     Rate and Rhythm: Normal rate.  Pulmonary:     Effort: Pulmonary effort is normal.  Abdominal:     Palpations: Abdomen is soft.     Tenderness: There is no right CVA tenderness or left CVA tenderness.  Genitourinary:    Comments: Deferred; patient performed self swab for testing  Skin:    General: Skin is warm and dry.     Comments: Scaling hyperpigmented quarter sized lesion noted to the anterior aspect of the right lower leg. No erythema, swelling or drainage noted.   Neurological:     General: No focal deficit  present.     Mental Status: She is alert.  Psychiatric:        Mood and Affect: Mood normal.  Behavior: Behavior normal.      UC Treatments / Results  Labs (all labs ordered are listed, but only abnormal results are displayed) Labs Reviewed  POCT URINALYSIS DIPSTICK, ED / UC - Abnormal; Notable for the following components:      Result Value   Hgb urine dipstick TRACE (*)    Leukocytes,Ua LARGE (*)    All other components within normal limits  URINE CULTURE  CERVICOVAGINAL ANCILLARY ONLY    EKG   Radiology No results found.  Procedures Procedures (including critical care time)  Medications Ordered in UC Medications - No data to display  Initial Impression / Assessment and Plan / UC Course  I have reviewed the triage vital signs and the nursing notes.  Pertinent labs & imaging results that were available during my care of the patient were reviewed by me and considered in my medical decision making (see chart for details).    46 year old female presents with a 3-day history of vaginal discharge, low back pain and suprapubic abdominal pain. She also has itching to the right lower leg for the past month.  Urinalysis shows a large amount of leukocytes with trace blood. Cervicovaginal testing and urine cultures pending. Will treat empirically with Macrobid and Flagyl.  Further treatment pending results of outstanding tests.  Additionally, will prescribe a topical steroid cream for area on leg. Patient advised to observe for signs of superimposed infection and systemic symptoms. Apply fragrance free skin moisturizer to area and follow-up with dermatology. Benadryl prn itching.   Today's evaluation has revealed no signs of a dangerous process. Discussed diagnosis with patient and/or guardian. Patient and/or guardian aware of their diagnosis, possible red flag symptoms to watch out for and need for close follow up. Patient and/or guardian understands verbal and written  discharge instructions. Patient and/or guardian comfortable with plan and disposition.  Patient and/or guardian has a clear mental status at this time, good insight into illness (after discussion and teaching) and has clear judgment to make decisions regarding their care  Documentation was completed with the aid of voice recognition software. Transcription may contain typographical errors. Final Clinical Impressions(s) / UC Diagnoses   Final diagnoses:  Acute vaginitis  Acute cystitis with hematuria  Skin lesion of right leg  Pruritus     Discharge Instructions      You have a urinary tract infection. The vaginal discharge could be from a bacterial infection or STD. I have started you on two medications for now. Urine cultures as well as testing for gonorrhea, chlamydia, trichomonas, yeast and bacterial vaginosis is pending. You should not have any sexual activity until you receive the results of the tests. You will only be notified for positive results. You may go online to [facility] and review your results. Practice safe sex practices by wearing a condom every time you have sex. Remember that people who have STIs may not experience any symptoms. However, even without symptoms, these infections can be spread from person to person and require treatment. STIs can be treated, and many STIs can be cured. However, some STIs cannot be cured and will affect you for the rest of your life. It's important to be checked regularly for STIs. You should also consider taking pre-exposure prophylaxis (PrEP) to prevent HIV infection. I have also prescribed a cream for you to use on the area that itches. Apply this twice a day for up to two weeks. You may also take benadryl as needed for itching. Keep the skin moisturized with  a fragrance free lotion/cream. Follow-up with dermatology. I provided information to [facility]. You will need to call to arrange an appointment.      ED Prescriptions      Medication Sig Dispense Auth. Provider   nitrofurantoin, macrocrystal-monohydrate, (MACROBID) 100 MG capsule Take 1 capsule (100 mg total) by mouth 2 (two) times daily. 10 capsule Enrique Sack, FNP   metroNIDAZOLE (FLAGYL) 500 MG tablet Take 1 tablet (500 mg total) by mouth 2 (two) times daily. 14 tablet Vanellope [place], [place], FNP   clotrimazole-betamethasone (LOTRISONE) cream Apply to affected area 2 times daily for up to 2 weeks 15 g Enrique Sack, FNP      PDMP not reviewed this encounter.   Enrique Sack, [place] 09/24/21 1735

## 2021-09-25 LAB — CERVICOVAGINAL ANCILLARY ONLY
Bacterial Vaginitis (gardnerella): POSITIVE — AB
Candida Glabrata: NEGATIVE
Candida Vaginitis: NEGATIVE
Chlamydia: NEGATIVE
Comment: NEGATIVE
Comment: NEGATIVE
Comment: NEGATIVE
Comment: NEGATIVE
Comment: NEGATIVE
Comment: NORMAL
Neisseria Gonorrhea: NEGATIVE
Trichomonas: POSITIVE — AB

## 2021-09-25 LAB — URINE CULTURE: Culture: >=100000 — AB

## 2021-09-26 ENCOUNTER — Telehealth (HOSPITAL_COMMUNITY): Payer: Self-pay | Admitting: Emergency Medicine

## 2021-09-26 MED ORDER — METRONIDAZOLE 500 MG PO TABS: 500.0000 mg | ORAL_TABLET | Freq: Two times a day (BID) | ORAL | 0 refills | Status: DC

## 2021-10-22 ENCOUNTER — Other Ambulatory Visit: Payer: Self-pay

## 2021-10-22 ENCOUNTER — Emergency Department (HOSPITAL_BASED_OUTPATIENT_CLINIC_OR_DEPARTMENT_OTHER)
Admission: EM | Admit: 2021-10-22 | Discharge: 2021-10-23 | Disposition: A | Discharge status: Home / Self Care | Payer: Self-pay | Attending: Emergency Medicine | Admitting: Emergency Medicine

## 2021-10-22 ENCOUNTER — Emergency Department (HOSPITAL_BASED_OUTPATIENT_CLINIC_OR_DEPARTMENT_OTHER): Payer: Self-pay

## 2021-10-22 ENCOUNTER — Encounter (HOSPITAL_BASED_OUTPATIENT_CLINIC_OR_DEPARTMENT_OTHER): Payer: Self-pay

## 2021-10-22 ENCOUNTER — Encounter (HOSPITAL_COMMUNITY): Payer: Self-pay | Admitting: Emergency Medicine

## 2021-10-22 ENCOUNTER — Ambulatory Visit (HOSPITAL_COMMUNITY)
Admission: EM | Admit: 2021-10-22 | Discharge: 2021-10-22 | Disposition: A | Discharge status: Home / Self Care | Payer: Medicaid Other | Attending: Physician Assistant | Admitting: Physician Assistant

## 2021-10-22 DIAGNOSIS — R519 Headache, unspecified: Secondary | ICD-10-CM | POA: Insufficient documentation

## 2021-10-22 DIAGNOSIS — I10 Essential (primary) hypertension: Secondary | ICD-10-CM

## 2021-10-22 DIAGNOSIS — Z79899 Other long term (current) drug therapy: Secondary | ICD-10-CM | POA: Insufficient documentation

## 2021-10-22 DIAGNOSIS — M542 Cervicalgia: Secondary | ICD-10-CM | POA: Insufficient documentation

## 2021-10-22 DIAGNOSIS — Z20822 Contact with and (suspected) exposure to covid-19: Secondary | ICD-10-CM | POA: Insufficient documentation

## 2021-10-22 LAB — CBC WITH DIFFERENTIAL/PLATELET
Abs Immature Granulocytes: 0.01 10*3/uL (ref 0.00–0.07)
Basophils Absolute: 0 10*3/uL (ref 0.0–0.1)
Basophils Relative: 0 %
Eosinophils Absolute: 0.1 10*3/uL (ref 0.0–0.5)
Eosinophils Relative: 1 %
HCT: 30.3 % — ABNORMAL LOW (ref 36.0–46.0)
Hemoglobin: 9.2 g/dL — ABNORMAL LOW (ref 12.0–15.0)
Immature Granulocytes: 0 %
Lymphocytes Relative: 28 %
Lymphs Abs: 2.1 10*3/uL (ref 0.7–4.0)
MCH: 22.2 pg — ABNORMAL LOW (ref 26.0–34.0)
MCHC: 30.4 g/dL (ref 30.0–36.0)
MCV: 73 fL — ABNORMAL LOW (ref 80.0–100.0)
Monocytes Absolute: 0.6 10*3/uL (ref 0.1–1.0)
Monocytes Relative: 8 %
Neutro Abs: 4.6 10*3/uL (ref 1.7–7.7)
Neutrophils Relative %: 63 %
Platelets: 433 10*3/uL — ABNORMAL HIGH (ref 150–400)
RBC: 4.15 MIL/uL (ref 3.87–5.11)
RDW: 15.8 % — ABNORMAL HIGH (ref 11.5–15.5)
WBC: 7.4 10*3/uL (ref 4.0–10.5)
nRBC: 0 % (ref 0.0–0.2)

## 2021-10-22 LAB — BASIC METABOLIC PANEL
Anion gap: 8 (ref 5–15)
BUN: 12 mg/dL (ref 6–20)
CO2: 26 mmol/L (ref 22–32)
Calcium: 9.1 mg/dL (ref 8.9–10.3)
Chloride: 103 mmol/L (ref 98–111)
Creatinine, Ser: 0.67 mg/dL (ref 0.44–1.00)
GFR, Estimated: >60 mL/min (ref >60)
Glucose, Bld: 98 mg/dL (ref 70–99)
Potassium: 3.5 mmol/L (ref 3.5–5.1)
Sodium: 137 mmol/L (ref 135–145)

## 2021-10-22 LAB — PREGNANCY, URINE: Preg Test, Ur: NEGATIVE

## 2021-10-22 LAB — SARS CORONAVIRUS 2 BY RT PCR: SARS Coronavirus 2 by RT PCR: NEGATIVE

## 2021-10-22 MED ORDER — BACLOFEN 10 MG PO TABS: 10.0000 mg | ORAL_TABLET | Freq: Two times a day (BID) | ORAL | 0 refills | Status: DC | PRN

## 2021-10-22 MED ORDER — ACETAMINOPHEN 500 MG PO TABS
1000.0000 mg | ORAL_TABLET | Freq: Once | ORAL | Status: AC
Start: 2021-10-22 — End: 2021-10-22
  Administered 2021-10-22: 1000 mg via ORAL
  Filled 2021-10-22: qty 2

## 2021-10-22 MED ORDER — METOCLOPRAMIDE HCL 5 MG/ML IJ SOLN
10.0000 mg | Freq: Once | INTRAMUSCULAR | Status: AC
  Administered 2021-10-22: 10 mg via INTRAVENOUS
  Filled 2021-10-22: qty 2

## 2021-10-22 MED ORDER — AMLODIPINE BESYLATE 5 MG PO TABS: 5.0000 mg | ORAL_TABLET | Freq: Every day | ORAL | 2 refills | Status: DC

## 2021-10-22 MED ORDER — SODIUM CHLORIDE 0.9 % IV SOLN: INTRAVENOUS | Status: DC

## 2021-10-22 MED ORDER — BUTALBITAL-APAP-CAFFEINE 50-325-40 MG PO TABS: 1.0000 | ORAL_TABLET | Freq: Three times a day (TID) | ORAL | 0 refills | Status: DC | PRN

## 2021-10-22 MED ORDER — DIPHENHYDRAMINE HCL 50 MG/ML IJ SOLN
12.5000 mg | Freq: Once | INTRAMUSCULAR | Status: AC
Start: 2021-10-22 — End: 2021-10-22
  Administered 2021-10-22: 12.5 mg via INTRAVENOUS
  Filled 2021-10-22: qty 1

## 2021-10-22 MED ORDER — SODIUM CHLORIDE 0.9 % IV BOLUS
1000.0000 mL | Freq: Once | INTRAVENOUS | Status: AC
  Administered 2021-10-22: 1000 mL via INTRAVENOUS

## 2021-10-22 NOTE — ED Notes (Signed)
Patient transported to CT 

## 2021-10-22 NOTE — ED Notes (Addendum)
Pt ambulatory from waiting room independently with steady gait; no acute vomiting; no obvious acute distress.  Pt now lying in bed awake and alert; lights dim - awaiting ED provider eval.

## 2021-10-22 NOTE — ED Notes (Signed)
Pt remains awake and alert- reports HA cocktail (IVF bolus/reglan/benadryl/tylenol) have only mildly relieved HA -- pt requesting additional pain med- provider,Dr. Sedonia Small, notified

## 2021-10-22 NOTE — ED Provider Notes (Signed)
[place]    CSN: [id number] Arrival date & time: 10/22/21  0802      History   Chief Complaint Chief Complaint  Patient presents with   Headache    HPI Connie Herrera is a 46 y.o. female.   Patient presents today with a 3-day history of intermittent headache.  Reports that this was generally in the frontal region but have settled into the left periorbital region.  She denies history of primary headache disorder including migraine but does often get headaches particular when her blood pressure is elevated.  She has stopped taking her blood pressure vacation in favor of more natural remedies but notices that this is continuing to be elevated and believes this is the trigger.  This is not the worst headache of her life.  She does not believe she needs to go to the emergency room.  She denies any associated weakness, dysarthria, visual disturbance, dizziness.  She does report nausea and vomiting yesterday but this is improved.  Her symptoms will temporarily improve with ibuprofen but then recur.  She has been taking this regularly to manage her symptoms.  She denies any recent medication changes or head injury.  She denies any recent illness or additional symptoms including cough, congestion, fever.    Past Medical History:  Diagnosis Date   Brain lesion    COVID-19 04/2020   Diplopia    Hypertension     Patient Active Problem List   Diagnosis Date Noted   Muscle spasms of both lower extremities 08/14/2020   Fibroid uterus 08/14/2020   Anemia due to blood loss, chronic 01/10/2020   Menorrhagia with regular cycle 01/09/2020   Class 1 obesity due to excess calories with body mass index (BMI) of 34.0 to 34.9 in adult 01/09/2020   Screening breast examination 02/07/2019   Breast pain 02/07/2019   Hypertension 01/26/2019   Paresthesias 08/04/2013   Breast pain, right 03/11/2012    Past Surgical History:  Procedure Laterality Date   TUBAL LIGATION      OB  History     Gravida  4   Para  4   Term  4   Preterm  0   AB  0   Living  4      SAB  0   IAB  0   Ectopic  0   Multiple  0   Live Births  4            Home Medications    Prior to Admission medications   Medication Sig Start Date End Date Taking? Authorizing Provider  amLODipine (NORVASC) 5 MG tablet Take 1 tablet (5 mg total) by mouth daily. 10/22/21  Yes Thamar Holik K, PA-C  baclofen (LIORESAL) 10 MG tablet Take 1 tablet (10 mg total) by mouth 2 (two) times daily as needed for muscle spasms. 10/22/21  Yes Areon Cocuzza, Derry Skill, PA-C  butalbital-acetaminophen-caffeine (FIORICET) 50-325-40 MG tablet Take 1 tablet by mouth every 8 (eight) hours as needed for headache. 10/22/21 10/22/22 Yes Barbaraann Avans, Junie Panning K, PA-C  hydrochlorothiazide (HYDRODIURIL) 25 MG tablet Take 1 tablet (25 mg total) by mouth daily. Must have office visit for refills 06/20/20 08/13/20  Brunetta Jeans, PA-C    Family History Family History  Problem Relation Age of Onset   Healthy Mother    Cirrhosis Father    Cancer Maternal Grandmother 64       breast   Breast cancer Maternal Grandmother     Social History  Social History   Tobacco Use   Smoking status: Former    Packs/day: 0.50    Years: 4.00    Total pack years: 2.00    Types: Cigarettes    Quit date: 06/04/2017    Years since quitting: 4.3   Smokeless tobacco: Never  Vaping Use   Vaping Use: Never used  Substance Use Topics   Alcohol use: No   Drug use: Yes    Types: Marijuana    Comment: 09/30/20 maybe once a week     Allergies   Patient has no known allergies.   Review of Systems Review of Systems  Constitutional:  Positive for activity change. Negative for appetite change, fatigue and fever.  HENT:  Negative for congestion and sore throat.   Eyes:  Positive for photophobia. Negative for visual disturbance.  Respiratory:  Negative for cough and shortness of breath.   Cardiovascular:  Negative for chest pain.   Gastrointestinal:  Positive for nausea and vomiting. Negative for abdominal pain and diarrhea.  Musculoskeletal:  Negative for arthralgias and myalgias.  Neurological:  Positive for headaches. Negative for dizziness, seizures, syncope, facial asymmetry, speech difficulty, weakness, light-headedness and numbness.     Physical Exam Triage Vital Signs ED Triage Vitals  Enc Vitals Group     BP 10/22/21 0823 (!) 159/99     Pulse Rate 10/22/21 0823 85     Resp 10/22/21 0823 18     Temp 10/22/21 0823 99.5 F (37.5 C)     Temp Source 10/22/21 0823 Oral     SpO2 10/22/21 0823 98 %     Weight --      Height 10/22/21 0824 '5\' 7"'$  (1.702 m)     Head Circumference --      Peak Flow --      Pain Score 10/22/21 0824 8     Pain Loc --      Pain Edu? --      Excl. in [place]? --    No data found.  Updated Vital Signs BP (!) 159/99 (BP Location: Right Arm)   Pulse 85   Temp 99.5 F (37.5 C) (Oral)   Resp 18   Ht '5\' 7"'$  (1.702 m)   LMP 10/15/2021   SpO2 98%   BMI 34.14 kg/m   Visual Acuity Right Eye Distance:   Left Eye Distance:   Bilateral Distance:    Right Eye Near:   Left Eye Near:    Bilateral Near:     Physical Exam Vitals reviewed.  Constitutional:      General: She is awake. She is not in acute distress.    Appearance: Normal appearance. She is well-developed. She is not ill-appearing.     Comments: Very pleasant female presented age in no acute distress sitting comfortably on exam room table  HENT:     Head: Normocephalic and atraumatic. No raccoon eyes, Battle's sign or contusion.     Right Ear: Tympanic membrane, ear canal and external ear normal. No hemotympanum.     Left Ear: Tympanic membrane, ear canal and external ear normal. No hemotympanum.     Nose: Nose normal.     Mouth/Throat:     Tongue: Tongue does not deviate from midline.     Pharynx: Uvula midline. No oropharyngeal exudate or posterior oropharyngeal erythema.  Eyes:     Extraocular Movements:  Extraocular movements intact.     Conjunctiva/sclera: Conjunctivae normal.     Pupils: Pupils are equal, round, and reactive to  light.  Cardiovascular:     Rate and Rhythm: Normal rate and regular rhythm.     Heart sounds: Normal heart sounds, S1 normal and S2 normal. No murmur heard. Pulmonary:     Effort: Pulmonary effort is normal.     Breath sounds: Normal breath sounds. No wheezing, rhonchi or rales.     Comments: Clear to auscultation bilaterally Musculoskeletal:     Cervical back: Normal range of motion and neck supple. No spinous process tenderness or muscular tenderness.     Comments: Strength 5/5 bilateral upper and lower extremities.  Neurological:     General: No focal deficit present.     Mental Status: She is alert and oriented to person, place, and time.     Cranial Nerves: Cranial nerves 2-12 are intact.     Motor: Motor function is intact.     Coordination: Coordination is intact.     Gait: Gait is intact.     Comments: No focal neurologic defect on exam.  Psychiatric:        Behavior: Behavior is cooperative.      UC Treatments / Results  Labs (all labs ordered are listed, but only abnormal results are displayed) Labs Reviewed - No data to display  EKG   Radiology No results found.  Procedures Procedures (including critical care time)  Medications Ordered in UC Medications - No data to display  Initial Impression / Assessment and Plan / UC Course  I have reviewed the triage vital signs and the nursing notes.  Pertinent labs & imaging results that were available during my care of the patient were reviewed by me and considered in my medical decision making (see chart for details).     Patient is well-appearing, afebrile, nontoxic, nontachycardic.  Vital signs and physical exam are reassuring today.  She is mildly hypertensive and we discussed that often with high blood pressure and headache she needs to go to the emergency room.  Patient reports that  her headache has been intermittent and lasting for several days.  Is not the worst headache of her life.  She declined ER evaluation at this time but does agree that if she has any worsening symptoms she will go to the emergency room immediately.  We will restart her blood pressure medication and amlodipine 5 mg present to pharmacy.  Discussed that she needs to be reevaluated within a week or 2 for blood pressure recheck and should follow-up with PCP and if not our clinic.  Discussed that she should avoid NSAIDs, caffeine, sodium, decongestants.  She was given baclofen for pain relief with instruction not to drive or drink alcohol with taking this.  She is to use Tylenol for pain relief.  She was also given 2 doses of Fioricet for additional pain relief.  Discussed that she is to rest and drink plenty fluid.  She was provided a work excuse note.  Discussed that if she has persistent headache, worsening headache, nausea/vomiting, dizziness, visual change, weakness she was go to the emergency room immediately.  Recommended close follow-up with her primary care provider.  Strict return precautions given.  Work excuse note provided.  Final Clinical Impressions(s) / UC Diagnoses   Final diagnoses:  Nonintractable headache, unspecified chronicity pattern, unspecified headache type  Elevated blood pressure reading with diagnosis of hypertension     Discharge Instructions      If you have any worsening symptoms including persistent severe headache with high blood pressure you must go to the emergency room  as we discussed.  Do not take NSAIDs including aspirin, ibuprofen/Advil, naproxen/Aleve.  Avoid decongestants, caffeine, sodium.  Make sure you rest and drink plenty of fluid.  Take Fioricet for headache pain.  Try not to use too much ibuprofen and Tylenol over-the-counter as this can cause a rebound headache.  Start a magnesium supplement to prevent headaches.  Start your blood pressure medication.  Follow-up  with primary care or our clinic within 1 week for recheck.  If you develop any severe headache, chest pain, shortness of breath, weakness, visual disturbance, confusion, dizziness you need to go to the ER immediately.     ED Prescriptions     Medication Sig Dispense Auth. Provider   baclofen (LIORESAL) 10 MG tablet Take 1 tablet (10 mg total) by mouth 2 (two) times daily as needed for muscle spasms. 30 each Smaran Gaus K, PA-C   amLODipine (NORVASC) 5 MG tablet Take 1 tablet (5 mg total) by mouth daily. 30 tablet Nikaya Nasby K, PA-C   butalbital-acetaminophen-caffeine (FIORICET) 50-325-40 MG tablet Take 1 tablet by mouth every 8 (eight) hours as needed for headache. 2 tablet Kelcey Korus, Derry Skill, PA-C      PDMP not reviewed this encounter.   Terrilee Croak, PA-C 10/22/21 [id number]

## 2021-10-22 NOTE — ED Triage Notes (Signed)
Patient c/o headache x 3 days, nausea and vomiting.  Patient does have a of high bp and had been out of her meds for a while.  Patient has taken Tylenol about 5 hours ago.

## 2021-10-22 NOTE — ED Provider Notes (Signed)
[facility] EMERGENCY DEPT Provider Note   CSN: [id number] Arrival date & time: 10/22/21  1913     History  Chief Complaint  Patient presents with   Headache    Connie Herrera is a 46 y.o. female.   Headache   46 year old female with past medical history significant for left basal ganglia cavernoma, HTN, diplopia who presents to the emergency department with headache.  The patient states that she woke up with a headache 3 days ago.  She denies any sudden onset or maximal onset of the headache.  She describes a gradual onset of headache 3 days ago.  She denies any fevers or chills.  She denies any neck rigidity, endorses mild neck discomfort.  She denies any cough, shortness of breath.  She denies any neurologic deficits.  No numbness, weakness, facial droop.  Home Medications Prior to Admission medications   Medication Sig Start Date End Date Taking? Authorizing Provider  amLODipine (NORVASC) 5 MG tablet Take 1 tablet (5 mg total) by mouth daily. 10/22/21   Raspet, Derry Skill, PA-C  baclofen (LIORESAL) 10 MG tablet Take 1 tablet (10 mg total) by mouth 2 (two) times daily as needed for muscle spasms. 10/22/21   Raspet, Derry Skill, PA-C  butalbital-acetaminophen-caffeine (FIORICET) 50-325-40 MG tablet Take 1 tablet by mouth every 8 (eight) hours as needed for headache. 10/22/21 10/22/22  Raspet, Derry Skill, PA-C  hydrochlorothiazide (HYDRODIURIL) 25 MG tablet Take 1 tablet (25 mg total) by mouth daily. Must have office visit for refills 06/20/20 08/13/20  Brunetta Jeans, PA-C      Allergies    Patient has no known allergies.    Review of Systems   Review of Systems  Neurological:  Positive for headaches.    Physical Exam Updated Vital Signs BP (!) 169/95 (BP Location: Right Arm)   Pulse 93   Temp 98.1 F (36.7 C)   Resp 16   Ht '5\' 7"'$  (1.702 m)   Wt 98.9 kg   LMP 10/15/2021   SpO2 100%   BMI 34.15 kg/m  Physical Exam Vitals and nursing note reviewed.   Constitutional:      General: She is not in acute distress.    Appearance: She is well-developed.  HENT:     Head: Normocephalic and atraumatic.  Eyes:     Conjunctiva/sclera: Conjunctivae normal.  Cardiovascular:     Rate and Rhythm: Normal rate and regular rhythm.     Heart sounds: No murmur heard. Pulmonary:     Effort: Pulmonary effort is normal. No respiratory distress.     Breath sounds: Normal breath sounds.  Abdominal:     Palpations: Abdomen is soft.     Tenderness: There is no abdominal tenderness.  Musculoskeletal:        General: No swelling.     Cervical back: Neck supple.  Skin:    General: Skin is warm and dry.     Capillary Refill: Capillary refill takes less than 2 seconds.  Neurological:     Mental Status: She is alert.     GCS: GCS eye subscore is 4. GCS verbal subscore is 5. GCS motor subscore is 6.     Cranial Nerves: No cranial nerve deficit, dysarthria or facial asymmetry.     Sensory: No sensory deficit.     Motor: No weakness.     Coordination: Coordination normal.  Psychiatric:        Mood and Affect: Mood normal.  Speech: Speech normal.     ED Results / Procedures / Treatments   Labs (all labs ordered are listed, but only abnormal results are displayed) Labs Reviewed - No data to display  EKG None  Radiology No results found.  Procedures Procedures    Medications Ordered in ED Medications - No data to display  ED Course/ Medical Decision Making/ A&P                           Medical Decision Making Amount and/or Complexity of Data Reviewed Labs: ordered. Radiology: ordered.  Risk OTC drugs. Prescription drug management.    47 year old female with past medical history significant for left basal ganglia cavernoma, HTN, diplopia who presents to the emergency department with headache.  The patient states that she woke up with a headache 3 days ago.  She denies any sudden onset or maximal onset of the headache.  She  describes a gradual onset of headache 3 days ago.  She denies any fevers or chills.  She denies any neck rigidity, endorses mild neck discomfort.  She denies any cough, shortness of breath.  She denies any neurologic deficits.  No numbness, weakness, facial droop.  On arrival, the patient was vitally stable.  Patient presenting with 3 days of a headache.  Does not describe a headache that was maximal in onset or sudden onset.  Describes a gradual onset headache that is left-sided, throbbing, associated with nausea and vomiting and light sensitivity and sound sensitivity.  States is not worst headache of life.  Symptoms are consistent with migraine headache.  Given the patient's history of basal ganglia cavernoma, will obtain screening CT of the head.   No neck rigidity to suggest meningitis and no fever.  No neurologic deficits at this time.  Given history of basal cavernoma, plan at time of signout to reassess the patient to determine need for MRI imaging WWO contrast, reassess patient's symptoms after migraine cocktail.  Signout given to Dr. Sedonia Small  at 2330.   Final Clinical Impression(s) / ED Diagnoses Final diagnoses:  None    Rx / DC Orders ED Discharge Orders     None         Regan Lemming, MD 10/23/21 2314

## 2021-10-22 NOTE — ED Triage Notes (Signed)
Patient here POV from Home.  Endorses Headache that began 3 Days ago and is Left Sided. Worsened since it began.  Went to UC this AM and given Medications but Patient did not take Medications as she was afraid of the Medications. Also given BP Medications as she has been trying to control her BP Naturally more Recently and has not been taking Medications for 2 Months.   Mild Nausea and 1 Episode of Emesis Yesterday. Moderate Photosensitivity. No Known Fevers.   NAD Noted during Triage. A&Ox4. GCS 15. Ambulatory.

## 2021-10-22 NOTE — Discharge Instructions (Signed)
If you have any worsening symptoms including persistent severe headache with high blood pressure you must go to the emergency room as we discussed.  Do not take NSAIDs including aspirin, ibuprofen/Advil, naproxen/Aleve.  Avoid decongestants, caffeine, sodium.  Make sure you rest and drink plenty of fluid.  Take Fioricet for headache pain.  Try not to use too much ibuprofen and Tylenol over-the-counter as this can cause a rebound headache.  Start a magnesium supplement to prevent headaches.  Start your blood pressure medication.  Follow-up with primary care or our clinic within 1 week for recheck.  If you develop any severe headache, chest pain, shortness of breath, weakness, visual disturbance, confusion, dizziness you need to go to the ER immediately.

## 2021-10-22 NOTE — ED Notes (Signed)
Pt ambulated to the bathroom independently with a steady gait.  

## 2021-10-22 NOTE — ED Provider Notes (Signed)
  Provider Note MRN:  [id number]  Arrival date & time: 10/23/21    ED Course and Medical Decision Making  Assumed care from Dr. Armandina Gemma at shift change.  Headache, history of basal ganglia malformation, needs reassessment after meds.  On my assessment at 12:20 AM patient is sleeping peacefully, wakes easily, no neurological deficits, headache is minimal, she feels well enough to go home and follow-up with her regular doctors.  Nothing to suggest emergent process, no indication for further testing or admission at this time.  Procedures  Final Clinical Impressions(s) / ED Diagnoses     ICD-10-CM   1. Nonintractable headache, unspecified chronicity pattern, unspecified headache type  R51.9       ED Discharge Orders          Ordered    prochlorperazine (COMPAZINE) 10 MG tablet  Every 8 hours PRN        10/23/21 0026              Discharge Instructions      You were evaluated in the Emergency Department and after careful evaluation, we did not find any emergent condition requiring admission or further testing in the hospital.  Your exam/testing today was overall reassuring.  Recommend follow-up with your regular doctors to discuss your symptoms.  Can use the Compazine as needed for headache.  Please return to the Emergency Department if you experience any worsening of your condition.  Thank you for allowing [place] to be a part of your care.       Barth Kirks. Sedonia Small, [place] [email or web address]    Maudie Flakes, MD 10/23/21 [phone]

## 2021-10-23 MED ORDER — PROCHLORPERAZINE MALEATE 10 MG PO TABS: 10.0000 mg | ORAL_TABLET | Freq: Three times a day (TID) | ORAL | 0 refills | Status: DC | PRN

## 2021-10-23 NOTE — Discharge Instructions (Addendum)
You were evaluated in the Emergency Department and after careful evaluation, we did not find any emergent condition requiring admission or further testing in the hospital.  Your exam/testing today was overall reassuring.  Recommend follow-up with your regular doctors to discuss your symptoms.  Can use the Compazine as needed for headache.  Please return to the Emergency Department if you experience any worsening of your condition.  Thank you for allowing [place] to be a part of your care.

## 2021-10-25 ENCOUNTER — Emergency Department (HOSPITAL_BASED_OUTPATIENT_CLINIC_OR_DEPARTMENT_OTHER)
Admission: EM | Admit: 2021-10-25 | Discharge: 2021-10-25 | Discharge status: Against Medical Advice | Payer: Medicaid Other | Attending: Emergency Medicine | Admitting: Emergency Medicine

## 2021-10-25 ENCOUNTER — Other Ambulatory Visit: Payer: Self-pay

## 2021-10-25 ENCOUNTER — Encounter (HOSPITAL_BASED_OUTPATIENT_CLINIC_OR_DEPARTMENT_OTHER): Payer: Self-pay | Admitting: Emergency Medicine

## 2021-10-25 DIAGNOSIS — I10 Essential (primary) hypertension: Secondary | ICD-10-CM | POA: Insufficient documentation

## 2021-10-25 DIAGNOSIS — Z5321 Procedure and treatment not carried out due to patient leaving prior to being seen by health care provider: Secondary | ICD-10-CM | POA: Insufficient documentation

## 2021-10-25 NOTE — ED Triage Notes (Signed)
Pt  is on blood pressure meds. Her blood pressure has been high for the past 6 days. Bp running 170's /100. Pt has splitting headache.

## 2021-10-26 ENCOUNTER — Other Ambulatory Visit: Payer: Self-pay

## 2021-10-26 ENCOUNTER — Emergency Department (HOSPITAL_COMMUNITY)
Admission: EM | Admit: 2021-10-26 | Discharge: 2021-10-26 | Discharge status: Against Medical Advice | Payer: Medicaid Other | Attending: Emergency Medicine | Admitting: Emergency Medicine

## 2021-10-26 ENCOUNTER — Encounter (HOSPITAL_COMMUNITY): Payer: Self-pay | Admitting: *Deleted

## 2021-10-26 DIAGNOSIS — Z79899 Other long term (current) drug therapy: Secondary | ICD-10-CM | POA: Insufficient documentation

## 2021-10-26 DIAGNOSIS — Z5329 Procedure and treatment not carried out because of patient's decision for other reasons: Secondary | ICD-10-CM | POA: Insufficient documentation

## 2021-10-26 DIAGNOSIS — I10 Essential (primary) hypertension: Secondary | ICD-10-CM | POA: Insufficient documentation

## 2021-10-26 DIAGNOSIS — G43009 Migraine without aura, not intractable, without status migrainosus: Secondary | ICD-10-CM | POA: Insufficient documentation

## 2021-10-26 MED ORDER — SODIUM CHLORIDE 0.9 % IV BOLUS: 1000.0000 mL | Freq: Once | INTRAVENOUS | Status: DC

## 2021-10-26 MED ORDER — DIPHENHYDRAMINE HCL 50 MG/ML IJ SOLN
12.5000 mg | Freq: Once | INTRAMUSCULAR | Status: AC
Start: 2021-10-26 — End: 2021-10-26
  Administered 2021-10-26: 12.5 mg via INTRAVENOUS
  Filled 2021-10-26: qty 1

## 2021-10-26 MED ORDER — KETOROLAC TROMETHAMINE 15 MG/ML IJ SOLN
15.0000 mg | Freq: Once | INTRAMUSCULAR | Status: AC
Start: 2021-10-26 — End: 2021-10-26
  Administered 2021-10-26: 15 mg via INTRAVENOUS
  Filled 2021-10-26: qty 1

## 2021-10-26 MED ORDER — METOCLOPRAMIDE HCL 5 MG/ML IJ SOLN
10.0000 mg | Freq: Once | INTRAMUSCULAR | Status: AC
Start: 2021-10-26 — End: 2021-10-26
  Administered 2021-10-26: 10 mg via INTRAVENOUS
  Filled 2021-10-26: qty 2

## 2021-10-26 MED ORDER — SODIUM CHLORIDE 0.9 % IV BOLUS
500.0000 mL | Freq: Once | INTRAVENOUS | Status: AC
  Administered 2021-10-26: 500 mL via INTRAVENOUS

## 2021-10-26 NOTE — ED Triage Notes (Signed)
Patient reports having elevated blood pressure x 6 days.  Pt takes '5mg'$  amlodipine daily and she also reports a headache. Patient's BP today after taking her amlodipine was 173/106. In addition to her headache, patient reports feeling "sick."  Patient a/o x 4 and ambulatory in triage.

## 2021-10-26 NOTE — ED Notes (Signed)
Called into the room. Patient states she needs to leave, she was just informed that her grand daughter was hit by a car. Fluids stopped and IV d/c'd. [place], [place] notified.

## 2021-10-26 NOTE — ED Provider Notes (Signed)
[facility] DEPT Provider Note   CSN: [id number] Arrival date & time: 10/26/21  1128     History  Chief Complaint  Patient presents with   Hypertension   Headache    Connie Herrera is a 46 y.o. female with a past medical history of hypertension presenting today with a headache.  She reports that for the past week she has been struggling with headaches and that she is unable to keep her medication down with amlodipine.  Was seen a couple days ago and treated with a migraine cocktail which helped her and she was discharged home.  She has been taking ibuprofen at home which resolves her headaches but they come right back.  She says this 1 has been more severe today and wanted to be reassessed again.  It was not acute onset in nature, not the worst headache she has ever had.  Endorsing photophobia and some floaters in her vision.  No slurred speech or weakness.   Hypertension Associated symptoms include headaches.  Headache      Home Medications Prior to Admission medications   Medication Sig Start Date End Date Taking? Authorizing Provider  amLODipine (NORVASC) 5 MG tablet Take 1 tablet (5 mg total) by mouth daily. 10/22/21   Raspet, Derry Skill, PA-C  baclofen (LIORESAL) 10 MG tablet Take 1 tablet (10 mg total) by mouth 2 (two) times daily as needed for muscle spasms. 10/22/21   Raspet, Derry Skill, PA-C  butalbital-acetaminophen-caffeine (FIORICET) 50-325-40 MG tablet Take 1 tablet by mouth every 8 (eight) hours as needed for headache. 10/22/21 10/22/22  Raspet, Derry Skill, PA-C  prochlorperazine (COMPAZINE) 10 MG tablet Take 1 tablet (10 mg total) by mouth every 8 (eight) hours as needed (nausea or headache). 10/23/21   Maudie Flakes, MD  hydrochlorothiazide (HYDRODIURIL) 25 MG tablet Take 1 tablet (25 mg total) by mouth daily. Must have office visit for refills 06/20/20 08/13/20  Brunetta Jeans, PA-C      Allergies    Patient has no known allergies.     Review of Systems   Review of Systems  Neurological:  Positive for headaches.    Physical Exam Updated Vital Signs BP (!) 155/96 (BP Location: Right Arm)   Pulse 89   Temp 98.2 F (36.8 C) (Oral)   Resp 16   Ht '5\' 7"'$  (1.702 m)   Wt 101.6 kg   LMP 10/15/2021 Comment: Patient had tubal ligation  SpO2 99%   BMI 35.08 kg/m  Physical Exam Vitals and nursing note reviewed.  Constitutional:      Appearance: Normal appearance.  HENT:     Head: Normocephalic and atraumatic.  Eyes:     General: No scleral icterus.    Conjunctiva/sclera: Conjunctivae normal.  Pulmonary:     Effort: Pulmonary effort is normal. No respiratory distress.  Skin:    Findings: No rash.  Neurological:     Mental Status: She is alert.     Cranial Nerves: No cranial nerve deficit.     Comments: Cranial nerves II through XII grossly intact.  Moving all extremities.  Normal strength.  PERRLA, EOMs intact.  Appears to be uncomfortable and with photophobia.  No problem with finger-nose  Psychiatric:        Mood and Affect: Mood normal.     ED Results / Procedures / Treatments   Labs (all labs ordered are listed, but only abnormal results are displayed) Labs Reviewed - No data to display  EKG None  Radiology No results found.  Procedures Procedures    Medications Ordered in ED Medications  ketorolac (TORADOL) 15 MG/ML injection 15 mg (15 mg Intravenous Given 10/26/21 1318)  metoCLOPramide (REGLAN) injection 10 mg (10 mg Intravenous Given 10/26/21 1318)  diphenhydrAMINE (BENADRYL) injection 12.5 mg (12.5 mg Intravenous Given 10/26/21 1318)  sodium chloride 0.9 % bolus 500 mL (500 mLs Intravenous New Bag/Given 10/26/21 1317)    ED Course/ Medical Decision Making/ A&P Clinical Course as of 10/26/21 1344  Sun Oct 26, 2021  1343 Patient got a phone call to family emergency and wishes to be discharged.  IV removed [MR]    Clinical Course User Index [MR] Ahmaud Duthie, Cecilio Asper, PA-C                            Medical Decision Making Risk Prescription drug management.   This patient presents to the ED for concern of headache.  Differential includes but is not limited to tension headache, migraine headache, CVA, aneurysm   This is not an exhaustive differential.    Past Medical History / Co-morbidities / Social History: Recurrent headaches over the past month   Additional history: Reviewed patient's last few visits   Physical Exam: Pertinent physical exam findings include normal neurologic exam  Lab Tests: NA   Imaging Studies: Considered CT imaging however patient had a CT scan within the last week.  I reviewed this and there were no abnormalities.  Believe this is a recurrence of her headache    Medications: I ordered medication including Reglan, Toradol and Benadryl and fluids.  On reevaluation patient reports she needs to leave due to a family emergency.  She will be discharged at this time.  Doubt severe cause of headache.  She did notcomplete her work-up so she will be marked as eloped.  Likely migraine headache   Final Clinical Impression(s) / ED Diagnoses Final diagnoses:  Migraine without aura and without status migrainosus, not intractable      Connie Herrera, Cecilio Asper, PA-C 10/26/21 1346    Daleen Bo, MD 10/26/21 [phone]

## 2021-12-09 DIAGNOSIS — E785 Hyperlipidemia, unspecified: Secondary | ICD-10-CM | POA: Diagnosis not present

## 2021-12-09 DIAGNOSIS — R7303 Prediabetes: Secondary | ICD-10-CM | POA: Diagnosis not present

## 2021-12-09 DIAGNOSIS — Z720 Tobacco use: Secondary | ICD-10-CM | POA: Diagnosis not present

## 2021-12-09 DIAGNOSIS — E538 Deficiency of other specified B group vitamins: Secondary | ICD-10-CM | POA: Diagnosis not present

## 2021-12-09 DIAGNOSIS — Z6830 Body mass index (BMI) 30.0-30.9, adult: Secondary | ICD-10-CM | POA: Diagnosis not present

## 2021-12-09 DIAGNOSIS — M791 Myalgia, unspecified site: Secondary | ICD-10-CM | POA: Diagnosis not present

## 2021-12-09 DIAGNOSIS — E6609 Other obesity due to excess calories: Secondary | ICD-10-CM | POA: Diagnosis not present

## 2021-12-09 DIAGNOSIS — D508 Other iron deficiency anemias: Secondary | ICD-10-CM | POA: Diagnosis not present

## 2021-12-19 ENCOUNTER — Telehealth: Payer: Self-pay | Admitting: Hematology and Oncology

## 2021-12-19 NOTE — Telephone Encounter (Signed)
Scheduled appt per 9/14 referral. Pt is aware of appt date and time. Pt is aware to arrive 15 mins prior to appt time and to bring and updated insurance card. Pt is aware of appt location.   

## 2021-12-22 ENCOUNTER — Ambulatory Visit (HOSPITAL_COMMUNITY)
Admission: EM | Admit: 2021-12-22 | Discharge: 2021-12-22 | Disposition: A | Discharge status: Home / Self Care | Payer: Medicaid Other | Attending: Family Medicine | Admitting: Family Medicine

## 2021-12-22 ENCOUNTER — Encounter (HOSPITAL_COMMUNITY): Payer: Self-pay | Admitting: *Deleted

## 2021-12-22 ENCOUNTER — Other Ambulatory Visit: Payer: Self-pay

## 2021-12-22 DIAGNOSIS — Z86018 Personal history of other benign neoplasm: Secondary | ICD-10-CM | POA: Insufficient documentation

## 2021-12-22 DIAGNOSIS — R1030 Lower abdominal pain, unspecified: Secondary | ICD-10-CM | POA: Insufficient documentation

## 2021-12-22 LAB — POCT URINALYSIS DIPSTICK, ED / UC
Bilirubin Urine: NEGATIVE
Glucose, UA: NEGATIVE mg/dL
Hgb urine dipstick: NEGATIVE
Ketones, ur: NEGATIVE mg/dL
Leukocytes,Ua: NEGATIVE
Nitrite: NEGATIVE
Protein, ur: NEGATIVE mg/dL
Specific Gravity, Urine: 1.02 (ref 1.005–1.030)
Urobilinogen, UA: 0.2 mg/dL (ref 0.0–1.0)
pH: 6 (ref 5.0–8.0)

## 2021-12-22 LAB — POC URINE PREG, ED: Preg Test, Ur: NEGATIVE

## 2021-12-22 MED ORDER — IBUPROFEN 800 MG PO TABS: 800.0000 mg | ORAL_TABLET | Freq: Three times a day (TID) | ORAL | 0 refills | Status: DC | PRN

## 2021-12-22 NOTE — Discharge Instructions (Signed)
Your urine was normal with no evidence of infection.  Monitor your MyChart for your swab results.  We will contact you if anything is positive.  I think your symptoms are related to fibroids.  Please follow-up with OB/GYN; call to schedule an appointment.  Use ibuprofen for pain relief.  Do not take additional NSAIDs with this medication including aspirin, ibuprofen/Advil, naproxen/Aleve as it will cause stomach bleeding.  You can use Tylenol for breakthrough pain.  If you have any worsening symptoms including severe abdominal pain, fever, nausea, vomiting, blood in your stool, changes in your bowel habits, pelvic pain, vaginal discharge, abnormal bleeding you need to be seen immediately.

## 2021-12-22 NOTE — ED Provider Notes (Signed)
[place]    CSN: [id number] Arrival date & time: 12/22/21  1731      History   Chief Complaint Chief Complaint  Patient presents with   UTI    HPI Connie Herrera is a 46 y.o. female.   Patient presents today with a several day history of intermittent lower abdominal pain.  She reports pain is rated 2/3 on a 0-10 pain scale, described as aching, localized to suprapubic region with radiation to the left, no alleviating factors identified.  She has been taking ibuprofen which provides temporary relief of symptoms.  She is concerned that her menstrual cycle might be coming on and wonders if this could be contributing to symptoms.  She does have some urinary frequency but states this is chronic and likely related to hydrochlorothiazide use.  She denies any dysuria, urinary urgency, hematuria, nausea, vomiting, fever.  She does have a history of numerous fibroids with last ultrasound 2021.  She is not currently followed by OB/GYN but is interested in establishing with them.  She has a strong family history of fibroids that required hysterectomy and her mother and sisters.  She denies any menorrhagia but does report that she has a history of iron deficiency anemia for which she is followed by hematology and undergoing iron infusions.  She denies any additional symptoms including changes in bowel habits, melena, hematochezia.    Past Medical History:  Diagnosis Date   Brain lesion    COVID-19 04/2020   Diplopia    Hypertension     Patient Active Problem List   Diagnosis Date Noted   Muscle spasms of both lower extremities 08/14/2020   Fibroid uterus 08/14/2020   Anemia due to blood loss, chronic 01/10/2020   Menorrhagia with regular cycle 01/09/2020   Class 1 obesity due to excess calories with body mass index (BMI) of 34.0 to 34.9 in adult 01/09/2020   Screening breast examination 02/07/2019   Breast pain 02/07/2019   Hypertension 01/26/2019   Paresthesias  08/04/2013   Breast pain, right 03/11/2012    Past Surgical History:  Procedure Laterality Date   TUBAL LIGATION      OB History     Gravida  4   Para  4   Term  4   Preterm  0   AB  0   Living  4      SAB  0   IAB  0   Ectopic  0   Multiple  0   Live Births  4            Home Medications    Prior to Admission medications   Medication Sig Start Date End Date Taking? Authorizing Provider  ibuprofen (ADVIL) 800 MG tablet Take 1 tablet (800 mg total) by mouth every 8 (eight) hours as needed. 12/22/21  Yes Sanaz Scarlett K, PA-C  amLODipine (NORVASC) 5 MG tablet Take 1 tablet (5 mg total) by mouth daily. 10/22/21   Ayza Ripoll, Derry Skill, PA-C  baclofen (LIORESAL) 10 MG tablet Take 1 tablet (10 mg total) by mouth 2 (two) times daily as needed for muscle spasms. 10/22/21   Kenneisha Cochrane, Derry Skill, PA-C  butalbital-acetaminophen-caffeine (FIORICET) 50-325-40 MG tablet Take 1 tablet by mouth every 8 (eight) hours as needed for headache. 10/22/21 10/22/22  Jaedan Schuman, Derry Skill, PA-C  prochlorperazine (COMPAZINE) 10 MG tablet Take 1 tablet (10 mg total) by mouth every 8 (eight) hours as needed (nausea or headache). 10/23/21   Maudie Flakes, MD  hydrochlorothiazide (HYDRODIURIL) 25 MG tablet Take 1 tablet (25 mg total) by mouth daily. Must have office visit for refills 06/20/20 08/13/20  Brunetta Jeans, PA-C    Family History Family History  Problem Relation Age of Onset   Healthy Mother    Cirrhosis Father    Cancer Maternal Grandmother 49       breast   Breast cancer Maternal Grandmother     Social History Social History   Tobacco Use   Smoking status: Former    Packs/day: 0.50    Years: 4.00    Total pack years: 2.00    Types: Cigarettes    Quit date: 06/04/2017    Years since quitting: 4.5   Smokeless tobacco: Never  Vaping Use   Vaping Use: Never used  Substance Use Topics   Alcohol use: No   Drug use: Not Currently    Types: Marijuana    Comment: 09/30/20 maybe once a  week     Allergies   Patient has no known allergies.   Review of Systems Review of Systems  Constitutional:  Negative for activity change, appetite change, fatigue and fever.  Gastrointestinal:  Positive for abdominal pain (Lower abdominal). Negative for diarrhea, nausea and vomiting.  Genitourinary:  Positive for frequency (Chronic). Negative for dysuria, hematuria, pelvic pain, urgency, vaginal bleeding, vaginal discharge and vaginal pain.     Physical Exam Triage Vital Signs ED Triage Vitals  Enc Vitals Group     BP 12/22/21 1904 (!) 142/90     Pulse Rate 12/22/21 1904 99     Resp 12/22/21 1904 18     Temp 12/22/21 1904 98.7 F (37.1 C)     Temp src --      SpO2 12/22/21 1904 99 %     Weight --      Height --      Head Circumference --      Peak Flow --      Pain Score 12/22/21 1903 4     Pain Loc --      Pain Edu? --      Excl. in [place]? --    No data found.  Updated Vital Signs BP (!) 142/90   Pulse 99   Temp 98.7 F (37.1 C)   Resp 18   LMP 11/24/2021   SpO2 99%   Visual Acuity Right Eye Distance:   Left Eye Distance:   Bilateral Distance:    Right Eye Near:   Left Eye Near:    Bilateral Near:     Physical Exam Vitals reviewed.  Constitutional:      General: She is awake. She is not in acute distress.    Appearance: Normal appearance. She is well-developed. She is not ill-appearing.     Comments: Very pleasant female appears stated age no acute distress sitting comfortably in exam room  HENT:     Head: Normocephalic and atraumatic.  Cardiovascular:     Rate and Rhythm: Normal rate and regular rhythm.     Heart sounds: Normal heart sounds, S1 normal and S2 normal. No murmur heard. Pulmonary:     Effort: Pulmonary effort is normal.     Breath sounds: Normal breath sounds. No wheezing, rhonchi or rales.     Comments: Clear to auscultation bilaterally Abdominal:     General: Bowel sounds are normal.     Palpations: Abdomen is soft.      Tenderness: There is abdominal tenderness in the suprapubic area. There is no  right CVA tenderness, left CVA tenderness, guarding or rebound. Negative signs include Rovsing's sign, McBurney's sign, psoas sign and obturator sign.     Comments: Mild tenderness to palpation in suprapubic region.  No evidence of acute abdomen on exam.  Psychiatric:        Behavior: Behavior is cooperative.      UC Treatments / Results  Labs (all labs ordered are listed, but only abnormal results are displayed) Labs Reviewed  POCT URINALYSIS DIPSTICK, ED / UC  POC URINE PREG, ED  CERVICOVAGINAL ANCILLARY ONLY    EKG   Radiology No results found.  Procedures Procedures (including critical care time)  Medications Ordered in UC Medications - No data to display  Initial Impression / Assessment and Plan / UC Course  I have reviewed the triage vital signs and the nursing notes.  Pertinent labs & imaging results that were available during my care of the patient were reviewed by me and considered in my medical decision making (see chart for details).     Vital signs and physical exam reassuring today; no indication for emergent evaluation or imaging.  Suspect symptoms are related to fibroid.  Urine was normal with no evidence of infection.  Urine pregnancy was negative.  STI swab was collected today-results pending.  We will contact her if need to arrange any treatment but she denies any significant vaginal discharge or pelvic pain so we will defer treatment for the time being.  She was given prescription for ibuprofen 800 mg to be used as needed.  Recommended that she follow-up closely with OB/GYN as she would likely benefit from further evaluation including repeat transvaginal ultrasound which we cannot arrange in urgent care.  She was given contact information for local provider with instruction to call to schedule an appointment.  Discussed that if she develops any additional symptoms including severe  abdominal pain, fever, nausea, vomiting, melena, hematochezia, pelvic pain she needs to be seen immediately to which she expressed understanding.  Final Clinical Impressions(s) / UC Diagnoses   Final diagnoses:  Lower abdominal pain  History of uterine fibroid     Discharge Instructions      Your urine was normal with no evidence of infection.  Monitor your MyChart for your swab results.  We will contact you if anything is positive.  I think your symptoms are related to fibroids.  Please follow-up with OB/GYN; call to schedule an appointment.  Use ibuprofen for pain relief.  Do not take additional NSAIDs with this medication including aspirin, ibuprofen/Advil, naproxen/Aleve as it will cause stomach bleeding.  You can use Tylenol for breakthrough pain.  If you have any worsening symptoms including severe abdominal pain, fever, nausea, vomiting, blood in your stool, changes in your bowel habits, pelvic pain, vaginal discharge, abnormal bleeding you need to be seen immediately.     ED Prescriptions     Medication Sig Dispense Auth. Provider   ibuprofen (ADVIL) 800 MG tablet Take 1 tablet (800 mg total) by mouth every 8 (eight) hours as needed. 12 tablet Danalee Flath, Derry Skill, PA-C      PDMP not reviewed this encounter.   Terrilee Croak, PA-C 12/22/21 1928

## 2021-12-22 NOTE — ED Triage Notes (Signed)
PT reports ABD and vag discharge. Pt wants test for std.

## 2021-12-23 LAB — CERVICOVAGINAL ANCILLARY ONLY
Bacterial Vaginitis (gardnerella): NEGATIVE
Candida Glabrata: NEGATIVE
Candida Vaginitis: NEGATIVE
Chlamydia: NEGATIVE
Comment: NEGATIVE
Comment: NEGATIVE
Comment: NEGATIVE
Comment: NEGATIVE
Comment: NEGATIVE
Comment: NORMAL
Neisseria Gonorrhea: NEGATIVE
Trichomonas: NEGATIVE

## 2022-01-03 ENCOUNTER — Encounter (HOSPITAL_COMMUNITY): Payer: Self-pay

## 2022-01-03 ENCOUNTER — Ambulatory Visit (HOSPITAL_COMMUNITY)
Admission: EM | Admit: 2022-01-03 | Discharge: 2022-01-03 | Disposition: A | Discharge status: Home / Self Care | Payer: Medicaid Other | Attending: Internal Medicine | Admitting: Internal Medicine

## 2022-01-03 DIAGNOSIS — N898 Other specified noninflammatory disorders of vagina: Secondary | ICD-10-CM | POA: Insufficient documentation

## 2022-01-03 LAB — POCT URINALYSIS DIPSTICK, ED / UC
Bilirubin Urine: NEGATIVE
Glucose, UA: NEGATIVE mg/dL
Hgb urine dipstick: NEGATIVE
Ketones, ur: NEGATIVE mg/dL
Leukocytes,Ua: NEGATIVE
Nitrite: NEGATIVE
Protein, ur: 30 mg/dL — AB
Specific Gravity, Urine: >=1.03 (ref 1.005–1.030)
Urobilinogen, UA: 0.2 mg/dL (ref 0.0–1.0)
pH: 5.5 (ref 5.0–8.0)

## 2022-01-03 MED ORDER — METRONIDAZOLE 500 MG PO TABS: 500.0000 mg | ORAL_TABLET | Freq: Two times a day (BID) | ORAL | 0 refills | Status: DC

## 2022-01-03 NOTE — ED Triage Notes (Signed)
Py is here for abdominal pain, pt will also like to have a STD check due to a odor from vaginax3 days

## 2022-01-03 NOTE — Discharge Instructions (Addendum)
Today you are being treated prophylactically for  Bacterial vaginosis   Urinalysis is negative   Take Metronidazole 500 mg twice a day for 7 days, do not drink alcohol while using medication, this will make you feel sick   Bacterial vaginosis which results from an overgrowth of one on several organisms that are normally present in your vagina. Vaginosis is an inflammation of the vagina that can result in discharge, itching and pain.  Labs pending 2-3 days, you will be contacted if positive for any sti and treatment will be sent to the pharmacy, you will have to return to the clinic if positive for gonorrhea to receive treatment   Please refrain from having sex until labs results, if positive please refrain from having sex until treatment complete and symptoms resolve   If positive for , Chlamydia  gonorrhea or trichomoniasis please notify partner or partners so they may tested as well  Moving forward, it is recommended you use some form of protection against the transmission of sti infections  such as condoms or dental dams with each sexual encounter     In addition: Avoid baths, hot tubs and whirlpool spas.  Don't use scented or harsh soaps Avoid irritants. These include scented tampons and pads. Wipe from front to back after using the toilet. Don't douche. Your vagina doesn't require cleansing other than normal bathing.  Use a condom.  Wear cotton underwear, this fabric absorbs some moisture.

## 2022-01-03 NOTE — ED Provider Notes (Signed)
[place]    CSN: [id number] Arrival date & time: 01/03/22  1503      History   Chief Complaint Chief Complaint  Patient presents with   Abdominal Pain    HPI Connie Herrera is a 46 y.o. female.   Patient presents with thin Connie Herrera vaginal discharge with odor, vaginal irritation and itching, lower abdominal pressure, urinary frequency and urgency for 3 days.  Sexually active, 1 female partner, no condom use, no known exposure.  Has not attempted treatment of symptoms.  Connie Herrera thin discharge with odor, vagianl irritation and mild itching for 3 days. Sexually ctive, 1, no condom, no exposure,no tretament, denies dysuria, hematuria, flank pain, fever, chills, new rash or lesions.  History of a uterine fibroid  Past Medical History:  Diagnosis Date   Brain lesion    COVID-19 04/2020   Diplopia    Hypertension     Patient Active Problem List   Diagnosis Date Noted   Muscle spasms of both lower extremities 08/14/2020   Fibroid uterus 08/14/2020   Anemia due to blood loss, chronic 01/10/2020   Menorrhagia with regular cycle 01/09/2020   Class 1 obesity due to excess calories with body mass index (BMI) of 34.0 to 34.9 in adult 01/09/2020   Screening breast examination 02/07/2019   Breast pain 02/07/2019   Hypertension 01/26/2019   Paresthesias 08/04/2013   Breast pain, right 03/11/2012    Past Surgical History:  Procedure Laterality Date   TUBAL LIGATION      OB History     Gravida  4   Para  4   Term  4   Preterm  0   AB  0   Living  4      SAB  0   IAB  0   Ectopic  0   Multiple  0   Live Births  4            Home Medications    Prior to Admission medications   Medication Sig Start Date End Date Taking? Authorizing Provider  amLODipine (NORVASC) 5 MG tablet Take 1 tablet (5 mg total) by mouth daily. 10/22/21   Raspet, Derry Skill, PA-C  baclofen (LIORESAL) 10 MG tablet Take 1 tablet (10 mg total) by mouth 2 (two) times daily as  needed for muscle spasms. 10/22/21   Raspet, Derry Skill, PA-C  butalbital-acetaminophen-caffeine (FIORICET) 50-325-40 MG tablet Take 1 tablet by mouth every 8 (eight) hours as needed for headache. 10/22/21 10/22/22  Raspet, Derry Skill, PA-C  ibuprofen (ADVIL) 800 MG tablet Take 1 tablet (800 mg total) by mouth every 8 (eight) hours as needed. 12/22/21   Raspet, Derry Skill, PA-C  prochlorperazine (COMPAZINE) 10 MG tablet Take 1 tablet (10 mg total) by mouth every 8 (eight) hours as needed (nausea or headache). 10/23/21   Maudie Flakes, MD  hydrochlorothiazide (HYDRODIURIL) 25 MG tablet Take 1 tablet (25 mg total) by mouth daily. Must have office visit for refills 06/20/20 08/13/20  Brunetta Jeans, PA-C    Family History Family History  Problem Relation Age of Onset   Healthy Mother    Cirrhosis Father    Cancer Maternal Grandmother 22       breast   Breast cancer Maternal Grandmother     Social History Social History   Tobacco Use   Smoking status: Former    Packs/day: 0.50    Years: 4.00    Total pack years: 2.00    Types: Cigarettes  Quit date: 06/04/2017    Years since quitting: 4.5   Smokeless tobacco: Never  Vaping Use   Vaping Use: Never used  Substance Use Topics   Alcohol use: No   Drug use: Not Currently    Types: Marijuana    Comment: 09/30/20 maybe once a week     Allergies   Patient has no known allergies.   Review of Systems Review of Systems  Genitourinary:  Positive for frequency, pelvic pain, urgency, vaginal discharge and vaginal pain. Negative for decreased urine volume, difficulty urinating, dyspareunia, dysuria, enuresis, flank pain, genital sores, hematuria, menstrual problem and vaginal bleeding.     Physical Exam Triage Vital Signs ED Triage Vitals  Enc Vitals Group     BP 01/03/22 1612 139/87     Pulse Rate 01/03/22 1612 89     Resp 01/03/22 1612 16     Temp 01/03/22 1612 98.7 F (37.1 C)     Temp Source 01/03/22 1612 Oral     SpO2 01/03/22 1612 100  %     Weight 01/03/22 1610 227 lb (103 kg)     Height 01/03/22 1610 '5\' 7"'$  (1.702 m)     Head Circumference --      Peak Flow --      Pain Score 01/03/22 1609 7     Pain Loc --      Pain Edu? --      Excl. in [place]? --    No data found.  Updated Vital Signs BP 139/87 (BP Location: Left Arm)   Pulse 89   Temp 98.7 F (37.1 C) (Oral)   Resp 16   Ht '5\' 7"'$  (1.702 m)   Wt 227 lb (103 kg)   LMP  (LMP Unknown)   SpO2 100%   BMI 35.55 kg/m   Visual Acuity Right Eye Distance:   Left Eye Distance:   Bilateral Distance:    Right Eye Near:   Left Eye Near:    Bilateral Near:     Physical Exam Constitutional:      Appearance: She is well-developed.  Eyes:     Extraocular Movements: Extraocular movements intact.  Pulmonary:     Effort: Pulmonary effort is normal.  Abdominal:     General: Abdomen is flat. Bowel sounds are normal.     Palpations: Abdomen is soft.     Tenderness: There is no abdominal tenderness.  Skin:    General: Skin is warm and dry.  Neurological:     Mental Status: She is alert and oriented to person, place, and time. Mental status is at baseline.  Psychiatric:        Mood and Affect: Mood normal.        Behavior: Behavior normal.      UC Treatments / Results  Labs (all labs ordered are listed, but only abnormal results are displayed) Labs Reviewed  POCT URINALYSIS DIPSTICK, ED / UC - Abnormal; Notable for the following components:      Result Value   Protein, ur 30 (*)    All other components within normal limits  CERVICOVAGINAL ANCILLARY ONLY    EKG   Radiology No results found.  Procedures Procedures (including critical care time)  Medications Ordered in UC Medications - No data to display  Initial Impression / Assessment and Plan / UC Course  I have reviewed the triage vital signs and the nursing notes.  Pertinent labs & imaging results that were available during my care of the patient were  reviewed by me and considered in my  medical decision making (see chart for details).  Vaginal discharge  Urinalysis negative, symptomology is consistent with bacterial vaginosis, discussed with patient we will prophylactically treat, prescribed metronidazole and advised abstinence from alcohol while using medication  STI labs pending will treat per protocol, advised abstinence until lab results, and/or treatment is complete, advised condom use during all sexual encounters moving, may follow-up with urgent care as needed  Final Clinical Impressions(s) / UC Diagnoses   Final diagnoses:  None   Discharge Instructions   None    ED Prescriptions   None    PDMP not reviewed this encounter.   Hans Eden, NP 01/03/22 1643

## 2022-01-05 LAB — CERVICOVAGINAL ANCILLARY ONLY
Bacterial Vaginitis (gardnerella): NEGATIVE
Candida Glabrata: NEGATIVE
Candida Vaginitis: NEGATIVE
Chlamydia: NEGATIVE
Comment: NEGATIVE
Comment: NEGATIVE
Comment: NEGATIVE
Comment: NEGATIVE
Comment: NEGATIVE
Comment: NORMAL
Neisseria Gonorrhea: NEGATIVE
Trichomonas: NEGATIVE

## 2022-01-06 ENCOUNTER — Other Ambulatory Visit: Payer: Self-pay

## 2022-01-06 ENCOUNTER — Inpatient Hospital Stay: Payer: Medicaid Other

## 2022-01-06 ENCOUNTER — Encounter: Payer: Self-pay | Admitting: Hematology and Oncology

## 2022-01-06 ENCOUNTER — Inpatient Hospital Stay: Payer: 59 | Attending: Hematology and Oncology | Admitting: Hematology and Oncology

## 2022-01-06 VITALS — BP 136/95 | HR 89 | Temp 98.2°F | Resp 16 | Ht 67.0 in | Wt 233.3 lb

## 2022-01-06 DIAGNOSIS — Z79899 Other long term (current) drug therapy: Secondary | ICD-10-CM | POA: Insufficient documentation

## 2022-01-06 DIAGNOSIS — D509 Iron deficiency anemia, unspecified: Secondary | ICD-10-CM

## 2022-01-06 DIAGNOSIS — I10 Essential (primary) hypertension: Secondary | ICD-10-CM | POA: Diagnosis not present

## 2022-01-06 DIAGNOSIS — R0602 Shortness of breath: Secondary | ICD-10-CM | POA: Insufficient documentation

## 2022-01-06 DIAGNOSIS — Z8616 Personal history of COVID-19: Secondary | ICD-10-CM | POA: Diagnosis not present

## 2022-01-06 DIAGNOSIS — R5383 Other fatigue: Secondary | ICD-10-CM | POA: Insufficient documentation

## 2022-01-06 DIAGNOSIS — Z803 Family history of malignant neoplasm of breast: Secondary | ICD-10-CM | POA: Insufficient documentation

## 2022-01-06 LAB — CBC WITH DIFFERENTIAL/PLATELET
Abs Immature Granulocytes: 0.02 10*3/uL (ref 0.00–0.07)
Basophils Absolute: 0 10*3/uL (ref 0.0–0.1)
Basophils Relative: 0 %
Eosinophils Absolute: 0.1 10*3/uL (ref 0.0–0.5)
Eosinophils Relative: 2 %
HCT: 32.4 % — ABNORMAL LOW (ref 36.0–46.0)
Hemoglobin: 9.8 g/dL — ABNORMAL LOW (ref 12.0–15.0)
Immature Granulocytes: 0 %
Lymphocytes Relative: 30 %
Lymphs Abs: 2 10*3/uL (ref 0.7–4.0)
MCH: 22 pg — ABNORMAL LOW (ref 26.0–34.0)
MCHC: 30.2 g/dL (ref 30.0–36.0)
MCV: 72.8 fL — ABNORMAL LOW (ref 80.0–100.0)
Monocytes Absolute: 0.4 10*3/uL (ref 0.1–1.0)
Monocytes Relative: 7 %
Neutro Abs: 4 10*3/uL (ref 1.7–7.7)
Neutrophils Relative %: 61 %
Platelets: 450 10*3/uL — ABNORMAL HIGH (ref 150–400)
RBC: 4.45 MIL/uL (ref 3.87–5.11)
RDW: 16.9 % — ABNORMAL HIGH (ref 11.5–15.5)
WBC: 6.6 10*3/uL (ref 4.0–10.5)
nRBC: 0 % (ref 0.0–0.2)

## 2022-01-06 LAB — IRON AND IRON BINDING CAPACITY (CC-WL,HP ONLY)
Iron: 10 ug/dL — ABNORMAL LOW (ref 28–170)
Saturation Ratios: 2 % — ABNORMAL LOW (ref 10.4–31.8)
TIBC: 533 ug/dL — ABNORMAL HIGH (ref 250–450)
UIBC: 523 ug/dL — ABNORMAL HIGH (ref 148–442)

## 2022-01-06 LAB — FERRITIN: Ferritin: 3 ng/mL — ABNORMAL LOW (ref 11–307)

## 2022-01-06 NOTE — Progress Notes (Signed)
[facility] CONSULT NOTE  Patient Care Team: Pcp, No as PCP - General  CHIEF COMPLAINTS/PURPOSE OF CONSULTATION:  IDA  ASSESSMENT & PLAN:  No problem-specific Assessment & Plan notes found for this encounter.  Orders Placed This Encounter  Procedures   CBC with Differential/Platelet    Standing Status:   Standing    Number of Occurrences:   22    Standing Expiration Date:   01/07/2023   Iron and Iron Binding Capacity ([facility] only)    Standing Status:   Future    Number of Occurrences:   1    Standing Expiration Date:   01/07/2023   Ferritin    Standing Status:   Future    Number of Occurrences:   1    Standing Expiration Date:   01/06/2023  This is a pleasant 46 year old African-American female patient with past medical history significant for hypertension referred to hematology for recommendations regarding iron deficiency anemia.  She most likely has iron deficiency anemia secondary to chronic menstrual loss.  She is now on oral iron supplementation and tolerating it well.  No significant findings on exam.  I have reviewed labs scanned into the media which shows microcytic hypochromic anemia with low ferritin consistent with iron deficiency. Iron deficiency anemia affects a large proportion of the wellness population especially females of childbearing age and children.  Common causes of iron deficiency include blood loss, reduced iron absorption because of previous surgeries, dietary restrictions or other malabsorption issues, medications that reduce gastric acidity are due to inherited disorders such as IRIDA due to TMPRSS6 mutation. Symptoms of iron deficiency usually include fatigue, pica, restless legs, exercise intolerance, exertional dyspnea, headaches and weakness. Diagnosis can be usually made by history, physical examination, CBC and iron studies.  We generally treat iron deficiency anemia with oral supplements if this can be tolerated.  IV iron is appropriate  for patients are unable to tolerate due to gastrointestinal side effects or for patients with severe/ongoing blood loss, history of gastric bypass which reduces gastric acid and henceforth will impair intestinal absorption of oral iron, malabsorption syndromes are occasional in pregnancy.  If repeat labs from today show severe iron deficiency, she can either continue oral iron but titrated to twice a day versus consider intravenous iron. There are several formularies of intravenous iron available in the market.  We have discussed about risk of allergic/infusion reactions including potentially life-threatening anaphylaxis with intravenous iron however these serious allergic reactions are exceedingly rare and overestimated.  In contrast to serious allergic reactions, IV iron may be associated with nonallergic infusion reactions including self-limited urticaria, palpitations, dizziness, neck and back spasm which again occur in less than 1% of the individuals and do not progress to more serious reactions.  She mentioned that she would rather take oral iron if this is an option.  We will review her labs from today and call her with recommendations.  HISTORY OF PRESENTING ILLNESS:  MARYMARGARET KIRKER 46 y.o. female is here because of IDA  This is a very pleasant 46 year old female patient with past medical history significant for hypertension on amlodipine now referred to hematology for evaluation of iron deficiency anemia.  She complains of shortness of breath on exertion, fatigue, feeling foggy.  She started on oral iron supplement about 2 weeks ago and has been tolerating it really well. She reports heavy menstrual cycles for the past 2 years. She denies any other bleeding.  She has never had a colonoscopy.  Mammogram was  about 3 months ago and according to the patient this was normal.  She denies any pica although she craves pickles and she wonders why.  She complains of severe lower extremity cramps  which limit her from doing any kind of exercise but she is active at home and does all her chores.  She never required iron infusion or blood transfusion.  Rest of the pertinent 10 point ROS reviewed and negative  REVIEW OF SYSTEMS:   Constitutional: Denies fevers, chills or abnormal night sweats Eyes: Denies blurriness of vision, double vision or watery eyes Ears, nose, mouth, throat, and face: Denies mucositis or sore throat Respiratory: Denies cough, dyspnea or wheezes Cardiovascular: Denies palpitation, chest discomfort or lower extremity swelling Gastrointestinal:  Denies nausea, heartburn or change in bowel habits Skin: Denies abnormal skin rashes Lymphatics: Denies new lymphadenopathy or easy bruising Neurological:Denies numbness, tingling or new weaknesses Behavioral/Psych: Mood is stable, no new changes  All other systems were reviewed with the patient and are negative.  MEDICAL HISTORY:  Past Medical History:  Diagnosis Date   Brain lesion    COVID-19 04/2020   Diplopia    Hypertension     SURGICAL HISTORY: Past Surgical History:  Procedure Laterality Date   TUBAL LIGATION      SOCIAL HISTORY: Social History   Socioeconomic History   Marital status: Single    Spouse name: Not on file   Number of children: 4   Years of education: 14   Highest education level: Some college, no degree  Occupational History    Employer: [employer]    Comment: chef  Tobacco Use   Smoking status: Former    Packs/day: 0.50    Years: 4.00    Total pack years: 2.00    Types: Cigarettes    Quit date: 06/04/2017    Years since quitting: 4.5   Smokeless tobacco: Never  Vaping Use   Vaping Use: Never used  Substance and Sexual Activity   Alcohol use: No   Drug use: Not Currently    Types: Marijuana    Comment: 09/30/20 maybe once a week   Sexual activity: Yes    Birth control/protection: Surgical  Other Topics Concern   Not on file  Social History Narrative    Patient lives at home with children.    Patient has 4 children.    Patient is single.    Patient has 2 years of college.    Patient is right handed.    Patient is working full time. [employer].    Social Determinants of Health   Financial Resource Strain: Not on file  Food Insecurity: No Food Insecurity (01/02/2021)   Hunger Vital Sign    Worried About Running Out of Food in the Last Year: Never true    Ran Out of Food in the Last Year: Never true  Transportation Needs: No Transportation Needs (01/02/2021)   PRAPARE - Hydrologist (Medical): No    Lack of Transportation (Non-Medical): No  Physical Activity: Not on file  Stress: Not on file  Social Connections: Not on file  Intimate Partner Violence: Not on file    FAMILY HISTORY: Family History  Problem Relation Age of Onset   Healthy Mother    Cirrhosis Father    Cancer Maternal Grandmother 23       breast   Breast cancer Maternal Grandmother     ALLERGIES:  has No Known Allergies.  MEDICATIONS:  Current  Outpatient Medications  Medication Sig Dispense Refill   amLODipine (NORVASC) 5 MG tablet Take 1 tablet (5 mg total) by mouth daily. 30 tablet 2   baclofen (LIORESAL) 10 MG tablet Take 1 tablet (10 mg total) by mouth 2 (two) times daily as needed for muscle spasms. 30 each 0   butalbital-acetaminophen-caffeine (FIORICET) 50-325-40 MG tablet Take 1 tablet by mouth every 8 (eight) hours as needed for headache. 2 tablet 0   ibuprofen (ADVIL) 800 MG tablet Take 1 tablet (800 mg total) by mouth every 8 (eight) hours as needed. 12 tablet 0   metroNIDAZOLE (FLAGYL) 500 MG tablet Take 1 tablet (500 mg total) by mouth 2 (two) times daily. 14 tablet 0   prochlorperazine (COMPAZINE) 10 MG tablet Take 1 tablet (10 mg total) by mouth every 8 (eight) hours as needed (nausea or headache). 20 tablet 0   No current facility-administered medications for this visit.     PHYSICAL EXAMINATION: ECOG  PERFORMANCE STATUS: 0 - Asymptomatic  Vitals:   01/06/22 1010  BP: (!) 136/95  Pulse: 89  Resp: 16  Temp: 98.2 F (36.8 C)  SpO2: 100%   Filed Weights   01/06/22 1010  Weight: 233 lb 4.8 oz (105.8 kg)    GENERAL:alert, no distress and comfortable SKIN: skin color, texture, turgor are normal, no rashes or significant lesions EYES: normal, conjunctiva are pink and non-injected, sclera clear NECK: supple, thyroid normal size, non-tender, without nodularity LYMPH:  no palpable lymphadenopathy in the cervical,  LUNGS: clear to auscultation and percussion with normal breathing effort HEART: regular rate & rhythm and no murmurs and no lower extremity edema ABDOMEN:abdomen soft, non-tender and normal bowel sounds Musculoskeletal:no cyanosis of digits and no clubbing  PSYCH: alert & oriented x 3 with fluent speech NEURO: no focal motor/sensory deficits  LABORATORY DATA:  I have reviewed the data as listed Lab Results  Component Value Date   WBC 7.4 10/22/2021   HGB 9.2 (L) 10/22/2021   HCT 30.3 (L) 10/22/2021   MCV 73.0 (L) 10/22/2021   PLT 433 (H) 10/22/2021     Chemistry      Component Value Date/Time   NA 137 10/22/2021 2225   NA 140 01/09/2020 1351   K 3.5 10/22/2021 2225   CL 103 10/22/2021 2225   CO2 26 10/22/2021 2225   BUN 12 10/22/2021 2225   BUN 9 01/09/2020 1351   CREATININE 0.67 10/22/2021 2225      Component Value Date/Time   CALCIUM 9.1 10/22/2021 2225   ALKPHOS 67 02/12/2021 1303   AST 11 (L) 02/12/2021 1303   ALT 10 02/12/2021 1303   BILITOT 0.3 02/12/2021 1303   BILITOT <0.2 01/09/2020 1351     Most recent labs with iron saturation of 4%, total iron binding capacity of 4 and serum iron of 22. Last hemoglobin of 9.4 g/dL, normal white blood cell count, microcytosis and hypochromia noted, increased Red cell distribution width and mild thrombocytosis at 542,000 differential looks accurate  RADIOGRAPHIC STUDIES: I have personally reviewed the  radiological images as listed and agreed with the findings in the report. No results found.  All questions were answered. The patient knows to call the clinic with any problems, questions or concerns. I spent 45 minutes in the care of this patient including H and P, review of records, counseling and coordination of care.     Benay Pike, MD 01/06/2022 10:39 AM

## 2022-01-08 ENCOUNTER — Other Ambulatory Visit: Payer: Self-pay | Admitting: *Deleted

## 2022-01-08 MED ORDER — FERROUS SULFATE 325 (65 FE) MG PO TBEC: 1.0000 | DELAYED_RELEASE_TABLET | Freq: Two times a day (BID) | ORAL | 3 refills | Status: DC

## 2022-01-15 ENCOUNTER — Telehealth: Payer: Self-pay | Admitting: *Deleted

## 2022-01-15 NOTE — Telephone Encounter (Addendum)
-----   Message from Benay Pike, MD sent at 01/06/2022  6:10 PM EDT ----- Ferritin is low, but Hb is almost 10 g/dl. She said she preferred oral iron supplementation. If she would increase to BID and RTC in 3 months.  This RN spoke with pt per above with pt stating understanding of request.  Scheduling sent request for lab in December 2023

## 2022-02-11 ENCOUNTER — Telehealth: Payer: 59 | Admitting: Physician Assistant

## 2022-02-11 DIAGNOSIS — A6004 Herpesviral vulvovaginitis: Secondary | ICD-10-CM

## 2022-02-11 DIAGNOSIS — H1013 Acute atopic conjunctivitis, bilateral: Secondary | ICD-10-CM

## 2022-02-11 MED ORDER — AZELASTINE HCL 0.05 % OP SOLN: 1.0000 [drp] | Freq: Two times a day (BID) | OPHTHALMIC | 1 refills | Status: DC | PRN

## 2022-02-11 MED ORDER — VALACYCLOVIR HCL 500 MG PO TABS: 500.0000 mg | ORAL_TABLET | Freq: Two times a day (BID) | ORAL | 1 refills | Status: AC

## 2022-02-11 NOTE — Progress Notes (Signed)
Virtual Visit Consent   Connie Herrera, you are scheduled for a virtual visit with a [facility] provider today. Just as with appointments in the office, your consent must be obtained to participate. Your consent will be active for this visit and any virtual visit you may have with one of our providers in the next 365 days. If you have a MyChart account, a copy of this consent can be sent to you electronically.  As this is a virtual visit, video technology does not allow for your provider to perform a traditional examination. This may limit your provider's ability to fully assess your condition. If your provider identifies any concerns that need to be evaluated in person or the need to arrange testing (such as labs, EKG, etc.), we will make arrangements to do so. Although advances in technology are sophisticated, we cannot ensure that it will always work on either your end or our end. If the connection with a video visit is poor, the visit may have to be switched to a telephone visit. With either a video or telephone visit, we are not always able to ensure that we have a secure connection.  By engaging in this virtual visit, you consent to the provision of healthcare and authorize for your insurance to be billed (if applicable) for the services provided during this visit. Depending on your insurance coverage, you may receive a charge related to this service.  I need to obtain your verbal consent now. Are you willing to proceed with your visit today? Connie Herrera has provided verbal consent on 02/11/2022 for a virtual visit (video or telephone). Leeanne Rio, [place]  Date: 02/11/2022 3:03 PM  Virtual Visit via Video Note   I, Leeanne Rio, connected with  Connie Herrera  ([id number], December 16, 1975) on 02/11/22 at  2:45 PM EST by a video-enabled telemedicine application and verified that I am speaking with the correct person using two identifiers.  Location: Patient:  Virtual Visit Location Patient: Home Provider: Virtual Visit Location Provider: Home Office   I discussed the limitations of evaluation and management by telemedicine and the availability of in person appointments. The patient expressed understanding and agreed to proceed.    History of Present Illness: Connie Herrera is a 46 y.o. who identifies as a female who was assigned female at birth, and is being seen today for outbreak of genital herpes over past couple of days. Does not have any more of her antiviral for outbreaks. Last was about 2 years ago.   Also noting intermittent eye itching, watering and irritation. Is wondering if there is a drop that can help with this. Denies eye pain or vision changes.   HPI: HPI  Problems:  Patient Active Problem List   Diagnosis Date Noted   Muscle spasms of both lower extremities 08/14/2020   Fibroid uterus 08/14/2020   Anemia due to blood loss, chronic 01/10/2020   Menorrhagia with regular cycle 01/09/2020   Class 1 obesity due to excess calories with body mass index (BMI) of 34.0 to 34.9 in adult 01/09/2020   Screening breast examination 02/07/2019   Breast pain 02/07/2019   Hypertension 01/26/2019   Paresthesias 08/04/2013   Breast pain, right 03/11/2012    Allergies: No Known Allergies Medications:  Current Outpatient Medications:    azelastine (OPTIVAR) 0.05 % ophthalmic solution, Place 1 drop into both eyes 2 (two) times daily as needed., Disp: 6 mL, Rfl: 1   valACYclovir (VALTREX) 500 MG tablet, Take 1  tablet (500 mg total) by mouth 2 (two) times daily for 3 days., Disp: 6 tablet, Rfl: 1   amLODipine (NORVASC) 5 MG tablet, Take 1 tablet (5 mg total) by mouth daily., Disp: 30 tablet, Rfl: 2   baclofen (LIORESAL) 10 MG tablet, Take 1 tablet (10 mg total) by mouth 2 (two) times daily as needed for muscle spasms., Disp: 30 each, Rfl: 0   butalbital-acetaminophen-caffeine (FIORICET) 50-325-40 MG tablet, Take 1 tablet by mouth every 8  (eight) hours as needed for headache., Disp: 2 tablet, Rfl: 0   ferrous sulfate 325 (65 FE) MG EC tablet, Take 1 tablet (325 mg total) by mouth 2 (two) times daily., Disp: 60 tablet, Rfl: 3   ibuprofen (ADVIL) 800 MG tablet, Take 1 tablet (800 mg total) by mouth every 8 (eight) hours as needed., Disp: 12 tablet, Rfl: 0   metroNIDAZOLE (FLAGYL) 500 MG tablet, Take 1 tablet (500 mg total) by mouth 2 (two) times daily., Disp: 14 tablet, Rfl: 0   prochlorperazine (COMPAZINE) 10 MG tablet, Take 1 tablet (10 mg total) by mouth every 8 (eight) hours as needed (nausea or headache)., Disp: 20 tablet, Rfl: 0  Observations/Objective: Patient is well-developed, well-nourished in no acute distress.  Resting comfortably at home.  Head is normocephalic, atraumatic.  No labored breathing. Speech is clear and coherent with logical content.  Patient is alert and oriented at baseline.   Assessment and Plan: 1. Herpes simplex vulvovaginitis - valACYclovir (VALTREX) 500 MG tablet; Take 1 tablet (500 mg total) by mouth 2 (two) times daily for 3 days.  Dispense: 6 tablet; Refill: 1  Rx Valtrex BID x 3 days for recurrent outbreak. Additional refill given. Supportive measures and OTC medications.   2. Allergic conjunctivitis of both eyes - azelastine (OPTIVAR) 0.05 % ophthalmic solution; Place 1 drop into both eyes 2 (two) times daily as needed.  Dispense: 6 mL; Refill: 1  Astelin OP sent in to help with occasional eye itching and irritation  Follow Up Instructions: I discussed the assessment and treatment plan with the patient. The patient was provided an opportunity to ask questions and all were answered. The patient agreed with the plan and demonstrated an understanding of the instructions.  A copy of instructions were sent to the patient via MyChart unless otherwise noted below.   The patient was advised to call back or seek an in-person evaluation if the symptoms worsen or if the condition fails to improve  as anticipated.  Time:  I spent 10 minutes with the patient via telehealth technology discussing the above problems/concerns.    Leeanne Rio, PA-C

## 2022-02-11 NOTE — Patient Instructions (Signed)
Connie Herrera, thank you for joining Leeanne Rio, PA-C for today's virtual visit.  While this provider is not your primary care provider (PCP), if your PCP is located in our provider database this encounter information will be shared with them immediately following your visit.   [facility] account gives you access to today's visit and all your visits, tests, and labs performed at [facility] " click here if you don't have a [place] account or go to [email or web address]  Consent: (Patient) Connie Herrera provided verbal consent for this virtual visit at the beginning of the encounter.  Current Medications:  Current Outpatient Medications:    amLODipine (NORVASC) 5 MG tablet, Take 1 tablet (5 mg total) by mouth daily., Disp: 30 tablet, Rfl: 2   baclofen (LIORESAL) 10 MG tablet, Take 1 tablet (10 mg total) by mouth 2 (two) times daily as needed for muscle spasms., Disp: 30 each, Rfl: 0   butalbital-acetaminophen-caffeine (FIORICET) 50-325-40 MG tablet, Take 1 tablet by mouth every 8 (eight) hours as needed for headache., Disp: 2 tablet, Rfl: 0   ferrous sulfate 325 (65 FE) MG EC tablet, Take 1 tablet (325 mg total) by mouth 2 (two) times daily., Disp: 60 tablet, Rfl: 3   ibuprofen (ADVIL) 800 MG tablet, Take 1 tablet (800 mg total) by mouth every 8 (eight) hours as needed., Disp: 12 tablet, Rfl: 0   metroNIDAZOLE (FLAGYL) 500 MG tablet, Take 1 tablet (500 mg total) by mouth 2 (two) times daily., Disp: 14 tablet, Rfl: 0   prochlorperazine (COMPAZINE) 10 MG tablet, Take 1 tablet (10 mg total) by mouth every 8 (eight) hours as needed (nausea or headache)., Disp: 20 tablet, Rfl: 0   Medications ordered in this encounter:  No orders of the defined types were placed in this encounter.    *If you need refills on other medications prior to your next appointment, please contact your pharmacy*  Follow-Up: Call back or seek an in-person  evaluation if the symptoms worsen or if the condition fails to improve as anticipated.  [place] [phone]  Other Instructions Genital Herpes Genital herpes is a common sexually transmitted infection (STI) that is caused by a virus. The virus spreads from person to person through contact with a sore, infected saliva, or infected skin. The virus can cause itching, blisters, and sores around the genitals or rectum. During an outbreak of infection, symptoms may last for several days and then go away. However, the virus remains in the body, so more outbreaks may happen in the future. The time between outbreaks varies and can be from months to years. Genital herpes can affect anyone. It is particularly concerning for pregnant women because the virus can be passed to the baby during delivery. Genital herpes is also a concern for people who have a weak disease-fighting system (immune system). What are the causes? This condition is caused by the herpes simplex virus, type 1 or type 2 (HSV-1 or HSV-2). The virus may spread through: Sexual contact with an infected person, including vaginal, anal, and oral sex. Contact with a herpes sore. The skin. This means that you can get herpes from an infected partner even if there are no blisters or sores present. Your partner may not know that he or she is infected. What increases the risk? You are more likely to develop this condition if: You have sex with many partners. You do not use latex or polyurethane condoms during sex. What  are the signs or symptoms? Most people do not have symptoms or they have mild symptoms that may be mistaken for other skin problems. Symptoms may include: Small, red bumps near the genitals, rectum, or mouth. These bumps turn into blisters and then sores. Flu-like (influenza-like) symptoms, including: Fever. Body aches. Swollen lymph nodes. Headache. Painful urination. Pain and itching in the genital area or  rectal area. Vaginal discharge. Tingling or shooting pain in the legs and buttocks. Generally, symptoms are more severe and last longer during the first (primary) outbreak. Influenza-like symptoms are also more common during the primary outbreak. How is this diagnosed? This condition may be diagnosed based on: A physical exam. Your medical history. Blood tests. A test of a fluid sample (culture) from an open sore. How is this treated? There is no cure for this condition, but treatment with antiviral medicines can do the following: Speed up healing and relieve symptoms. Help to reduce the spread of the virus to sexual partners. Limit the chance of future outbreaks, or make future outbreaks shorter. Lessen symptoms of future outbreaks. Your health care provider may also recommend over-the-counter medicines to help with pain and itching. Follow these instructions at home: If you have an outbreak:  Keep the affected areas dry and clean. Avoid rubbing or touching blisters and sores. If you do touch blisters or sores: Wash your hands thoroughly with soap and water for at least 20 seconds. If soap and water are not available, use an alcohol-based hand sanitizer. Do not touch your eyes afterward. Sexual activity Do not have sexual contact during active outbreaks. Practice safe sex. Herpes can spread even if your partner does not have blisters or sores. Latex or polyurethane condoms and female condoms may help prevent the spread of the herpes virus. Managing pain and discomfort If directed, put ice on the painful area. To do this: Put ice in a plastic bag. Place a towel between your skin and the bag. Leave the ice on for 20 minutes, 2-3 times a day. Remove the ice if your skin turns bright red. This is very important. If you cannot feel pain, heat, or cold, you have a greater risk of damage to the area. If told, take a cool sitz bath to help relieve pain or itching. A sitz bath is a water  bath that you take while sitting down in water that is deep enough to cover your hips and buttocks. General instructions Take over-the-counter and prescription medicines only as told by your health care provider. If you were prescribed an antiviral medicine, use it as told by your health care provider. Do not stop using the antiviral even if you start to feel better. Keep all follow-up visits. This is important. How is this prevented? Use condoms. Although you can get genital herpes during sexual contact even with the use of a condom, a condom can provide some protection. Avoid having multiple sexual partners. Talk with your sexual partner about any symptoms either of you may have. Also, talk with your partner about any history of STIs. Do not have sexual contact if you have active symptoms of genital herpes. Contact a health care provider if: Your symptoms are not improving with medicine. Your symptoms return, or you have new symptoms. You have a fever. You have abdominal pain. You have redness, swelling, or pain in your eye. You notice new sores on other parts of your body. You have had herpes and you become pregnant or plan to become pregnant. Get help right  away if: You have symptoms of viral meningitis. This is rare but may happen if the virus spreads to the brain. Symptoms may include: Severe headache or stiff neck. Muscle aches. Nausea and vomiting. Sensitivity to light. Summary Genital herpes is a common sexually transmitted infection (STI) that is caused by the herpes simplex virus, type 1 or type 2 (HSV-1 or HSV-2). These viruses are most often spread through sexual contact with an infected person. You are more likely to develop this condition if you have sex with many partners or you do not use condoms during sex. Most people do not have symptoms or have mild symptoms that may be mistaken for other skin problems. Symptoms occur as outbreaks that may happen months or years  apart. There is no cure for this condition, but treatment with oral antiviral medicines can reduce symptoms, reduce the chance of spreading the virus to a partner, prevent future outbreaks, or shorten future outbreaks. This information is not intended to replace advice given to you by your health care provider. Make sure you discuss any questions you have with your health care provider. Document Revised: 12/26/2020 Document Reviewed: 12/26/2020 Elsevier Patient Education  [place].    If you have been instructed to have an in-person evaluation today at a local Urgent Care facility, please use the link below. It will take you to a list of all of our available [facility], including address, phone number and hours of operation. Please do not delay care.  [facility]  If you or a family member do not have a primary care provider, use the link below to schedule a visit and establish care. When you choose a [facility] primary care physician or advanced practice provider, you gain a long-term partner in health. Find a Primary Care Provider  Learn more about [facility]'s in-office and virtual care options: [facility]

## 2022-02-15 ENCOUNTER — Encounter (HOSPITAL_BASED_OUTPATIENT_CLINIC_OR_DEPARTMENT_OTHER): Payer: Self-pay

## 2022-02-15 ENCOUNTER — Emergency Department (HOSPITAL_BASED_OUTPATIENT_CLINIC_OR_DEPARTMENT_OTHER)
Admission: EM | Admit: 2022-02-15 | Discharge: 2022-02-16 | Disposition: A | Discharge status: Home / Self Care | Payer: 59 | Attending: Emergency Medicine | Admitting: Emergency Medicine

## 2022-02-15 ENCOUNTER — Emergency Department (HOSPITAL_BASED_OUTPATIENT_CLINIC_OR_DEPARTMENT_OTHER): Payer: 59 | Admitting: Radiology

## 2022-02-15 ENCOUNTER — Other Ambulatory Visit: Payer: Self-pay

## 2022-02-15 DIAGNOSIS — R079 Chest pain, unspecified: Secondary | ICD-10-CM | POA: Insufficient documentation

## 2022-02-15 DIAGNOSIS — X58XXXA Exposure to other specified factors, initial encounter: Secondary | ICD-10-CM | POA: Insufficient documentation

## 2022-02-15 DIAGNOSIS — N920 Excessive and frequent menstruation with regular cycle: Secondary | ICD-10-CM

## 2022-02-15 DIAGNOSIS — S46001A Unspecified injury of muscle(s) and tendon(s) of the rotator cuff of right shoulder, initial encounter: Secondary | ICD-10-CM

## 2022-02-15 HISTORY — DX: Iron deficiency anemia, unspecified: D50.9

## 2022-02-15 LAB — CBC
HCT: 31.4 % — ABNORMAL LOW (ref 36.0–46.0)
Hemoglobin: 9.3 g/dL — ABNORMAL LOW (ref 12.0–15.0)
MCH: 22 pg — ABNORMAL LOW (ref 26.0–34.0)
MCHC: 29.6 g/dL — ABNORMAL LOW (ref 30.0–36.0)
MCV: 74.4 fL — ABNORMAL LOW (ref 80.0–100.0)
Platelets: 509 10*3/uL — ABNORMAL HIGH (ref 150–400)
RBC: 4.22 MIL/uL (ref 3.87–5.11)
RDW: 18.2 % — ABNORMAL HIGH (ref 11.5–15.5)
WBC: 8.4 10*3/uL (ref 4.0–10.5)
nRBC: 0 % (ref 0.0–0.2)

## 2022-02-15 LAB — BASIC METABOLIC PANEL
Anion gap: 9 (ref 5–15)
BUN: 14 mg/dL (ref 6–20)
CO2: 25 mmol/L (ref 22–32)
Calcium: 8.8 mg/dL — ABNORMAL LOW (ref 8.9–10.3)
Chloride: 104 mmol/L (ref 98–111)
Creatinine, Ser: 0.74 mg/dL (ref 0.44–1.00)
GFR, Estimated: >60 mL/min (ref >60)
Glucose, Bld: 121 mg/dL — ABNORMAL HIGH (ref 70–99)
Potassium: 3.4 mmol/L — ABNORMAL LOW (ref 3.5–5.1)
Sodium: 138 mmol/L (ref 135–145)

## 2022-02-15 LAB — PREGNANCY, URINE: Preg Test, Ur: NEGATIVE

## 2022-02-15 LAB — TROPONIN I (HIGH SENSITIVITY): Troponin I (High Sensitivity): 3 ng/L (ref <18)

## 2022-02-15 MED ORDER — HYDROCODONE-ACETAMINOPHEN 5-325 MG PO TABS: 1.0000 | ORAL_TABLET | Freq: Four times a day (QID) | ORAL | 0 refills | Status: AC | PRN

## 2022-02-15 NOTE — Discharge Instructions (Addendum)
Follow-up with orthopedic surgery for evaluation of right shoulder pain. Medications for pain have been sent to your pharmacy.  Follow-up with OB/GYN for evaluation of menorrhalgia (heavy vaginal bleeding). I provided you with the office at [facility]. Please feel free to make an appointment here or anywhere you have been seen in the past.

## 2022-02-15 NOTE — ED Notes (Signed)
Pt verbalizes understanding of discharge instructions. Opportunity for questioning and answers were provided. Pt discharged from ED to home.   ? ?

## 2022-02-15 NOTE — ED Provider Notes (Signed)
[facility] EMERGENCY DEPT Provider Note   CSN: [id number] Arrival date & time: 02/15/22  1851     History  Chief Complaint  Patient presents with   Chest Pain   Vaginal Bleeding    Connie Herrera is a 46 y.o. female.  46 yo female presenting for shoulder pain. Pt admits to right sided shoulder pain, worse with movement and while sleeping, x 1 month. Denies any falls or known injuries. 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 Denies any skin color changes, motor loss, or sensation loss. Pt states she also suffers from anemia due to low iron levels and heavy menstrual bleeding. States she is currently on day 3 of menstrual bleeding. Follows with pcp but no OBGYN currently. Gets iron infusions. No blood transfusions yet. Admits to spasms in body. States that due to shoulder pain and full body spasms she felt like she had chest pain prior to arrival which prompted her visit to the ED.  The history is provided by the patient. No language interpreter was used.  Chest Pain Associated symptoms: no abdominal pain, no back pain, no cough, no fever, no palpitations, no shortness of breath and no vomiting   Vaginal Bleeding Associated symptoms: no abdominal pain, no back pain, no dysuria and no fever        Home Medications Prior to Admission medications   Medication Sig Start Date End Date Taking? Authorizing Provider  HYDROcodone-acetaminophen (NORCO) 5-325 MG tablet Take 1 tablet by mouth every 6 (six) hours as needed for up to 3 days for moderate pain. 02/15/22 61/44/31 Yes Campbell Stall P, DO  amLODipine (NORVASC) 5 MG tablet Take 1 tablet (5 mg total) by mouth daily. 10/22/21   Raspet, Derry Skill, PA-C  azelastine (OPTIVAR) 0.05 % ophthalmic solution Place 1 drop into both eyes 2 (two) times daily as needed. 02/11/22   Brunetta Jeans, PA-C  baclofen (LIORESAL) 10 MG tablet Take 1 tablet (10 mg total) by mouth 2 (two) times daily as  needed for muscle spasms. 10/22/21   Raspet, Derry Skill, PA-C  butalbital-acetaminophen-caffeine (FIORICET) 50-325-40 MG tablet Take 1 tablet by mouth every 8 (eight) hours as needed for headache. 10/22/21 10/22/22  Raspet, Derry Skill, PA-C  ferrous sulfate 325 (65 FE) MG EC tablet Take 1 tablet (325 mg total) by mouth 2 (two) times daily. 01/08/22   Benay Pike, MD  ibuprofen (ADVIL) 800 MG tablet Take 1 tablet (800 mg total) by mouth every 8 (eight) hours as needed. 12/22/21   Raspet, Derry Skill, PA-C  metroNIDAZOLE (FLAGYL) 500 MG tablet Take 1 tablet (500 mg total) by mouth 2 (two) times daily. 01/03/22   White, Leitha Schuller, NP  prochlorperazine (COMPAZINE) 10 MG tablet Take 1 tablet (10 mg total) by mouth every 8 (eight) hours as needed (nausea or headache). 10/23/21   Maudie Flakes, MD  hydrochlorothiazide (HYDRODIURIL) 25 MG tablet Take 1 tablet (25 mg total) by mouth daily. Must have office visit for refills 06/20/20 08/13/20  Brunetta Jeans, PA-C      Allergies    Patient has no known allergies.    Review of Systems   Review of Systems  Constitutional:  Negative for chills and fever.  HENT:  Negative for ear pain and sore throat.   Eyes:  Negative for pain and visual disturbance.  Respiratory:  Positive for chest tightness. Negative for cough and shortness of breath.   Cardiovascular:  Positive for chest pain.  Negative for palpitations.  Gastrointestinal:  Negative for abdominal pain and vomiting.  Genitourinary:  Positive for vaginal bleeding. Negative for dysuria and hematuria.  Musculoskeletal:  Negative for arthralgias and back pain.  Skin:  Negative for color change and rash.  Neurological:  Negative for seizures and syncope.  All other systems reviewed and are negative.   Physical Exam Updated Vital Signs BP 132/79   Pulse 75   Temp 97.6 F (36.4 C)   Resp (!) 23   Ht '5\' 7"'$  (1.702 m)   Wt 104.3 kg   LMP 02/09/2022 (Exact Date)   SpO2 100%   BMI 36.02 kg/m  Physical  Exam Vitals and nursing note reviewed.  Constitutional:      General: She is not in acute distress.    Appearance: She is well-developed.  HENT:     Head: Normocephalic and atraumatic.  Eyes:     Conjunctiva/sclera: Conjunctivae normal.  Cardiovascular:     Rate and Rhythm: Normal rate and regular rhythm.     Pulses:          Radial pulses are 2+ on the right side.     Heart sounds: No murmur heard. Pulmonary:     Effort: Pulmonary effort is normal. No respiratory distress.     Breath sounds: Normal breath sounds.  Abdominal:     Palpations: Abdomen is soft.     Tenderness: There is no abdominal tenderness.  Musculoskeletal:        General: No swelling.     Right shoulder: Tenderness present. No swelling, deformity or bony tenderness. Decreased range of motion.     Right upper arm: Normal.     Right elbow: Normal.     Right forearm: Normal.     Right wrist: Normal.     Right hand: Normal.       Arms:     Cervical back: Neck supple.  Skin:    General: Skin is warm and dry.     Capillary Refill: Capillary refill takes less than 2 seconds.  Neurological:     Mental Status: She is alert and oriented to person, place, and time.     GCS: GCS eye subscore is 4. GCS verbal subscore is 5. GCS motor subscore is 6.     Sensory: Sensation is intact.     Motor: Motor function is intact.  Psychiatric:        Mood and Affect: Mood normal.     ED Results / Procedures / Treatments   Labs (all labs ordered are listed, but only abnormal results are displayed) Labs Reviewed  BASIC METABOLIC PANEL - Abnormal; Notable for the following components:      Result Value   Potassium 3.4 (*)    Glucose, Bld 121 (*)    Calcium 8.8 (*)    All other components within normal limits  CBC - Abnormal; Notable for the following components:   Hemoglobin 9.3 (*)    HCT 31.4 (*)    MCV 74.4 (*)    MCH 22.0 (*)    MCHC 29.6 (*)    RDW 18.2 (*)    Platelets 509 (*)    All other components within  normal limits  PREGNANCY, URINE  TROPONIN I (HIGH SENSITIVITY)  TROPONIN I (HIGH SENSITIVITY)    EKG EKG Interpretation  Date/Time:  Sunday February 15 2022 18:58:36 EST Ventricular Rate:  87 PR Interval:  190 QRS Duration: 88 QT Interval:  376 QTC Calculation: 452 R Axis:  13 Text Interpretation: Normal sinus rhythm Minimal voltage criteria for LVH, may be normal variant ( R in aVL ) Cannot rule out Anterior infarct , age undetermined T wave abnormality, consider inferior ischemia Abnormal ECG When compared with ECG of 26-Sep-2020 14:23, Minimal criteria for Anterior infarct are now Present Inverted T waves have replaced nonspecific T wave abnormality in Inferior leads Nonspecific T wave abnormality now evident in Anterolateral leads Confirmed by Margaretmary Eddy [phone]) on 02/15/2022 10:31:05 PM  Radiology DG Chest 2 View  Result Date: 02/15/2022 CLINICAL DATA:  Chest pain.  Right arm and neck pain. EXAM: CHEST - 2 VIEW COMPARISON:  None Available. FINDINGS: The heart size and mediastinal contours are within normal limits. Both lungs are clear. The visualized skeletal structures are unremarkable. IMPRESSION: No active cardiopulmonary disease. Electronically Signed   By: Keane Police D.O.   On: 02/15/2022 19:46    Procedures Procedures    Medications Ordered in ED Medications - No data to display  ED Course/ Medical Decision Making/ A&P                           Medical Decision Making Amount and/or Complexity of Data Reviewed Labs: ordered. Radiology: ordered.   11:51 PM 46 yo female presenting for shoulder pain x 1 month.  Patient is alert and oriented x3, no acute distress, afebrile, stable vital signs.  Patient does endorse some chest pain secondary to shoulder pain.  EKG demonstrates normal sinus rhythm with no ST segment lobation or depression.  Troponin stable.  Stable electrolytes.  Physical exam demonstrates tenderness to palpation of the to the superior anterior  labrum starting for a SLAP tear.  Patient recommended to follow-up with orthopedic surgery for evaluation of rotator cuff.  Patient offered medications for pain control and declined at this time.  Patient also admits to a history of heavy but short vaginal bleeding.  States she is currently on day 3.  Patient is currently seeing her primary care physician who has prescribed her a medication-no name-to help with bleeding however she has not started taking it yet.  She states both her mother and her 2 sisters have both had hysterectomies already secondary to uterine fibroids and heavy menstrual bleeding.  Patient recommended for close follow-up with OB/GYN.  Local physician provided.  Hemoglobin stable at 9.2 which is consistent with patient's baseline over the past year.  Patient states she is not here for vaginal bleeding but was concerned because she started repeat bleeding secondary to shoulder pain and muscle spasms.  she states she was wondering if they were related.  Denies any lightheadedness, dizziness, or syncope.  At this time I do not see indication for a vaginal exam in the ED.  Patient in no distress and overall condition improved here in the ED. Detailed discussions were had with the patient regarding current findings, and need for close f/u with PCP or on call doctor. The patient has been instructed to return immediately if the symptoms worsen in any way for re-evaluation. Patient verbalized understanding and is in agreement with current care plan. All questions answered prior to discharge.         Final Clinical Impression(s) / ED Diagnoses Final diagnoses:  Injury of right rotator cuff, initial encounter  Menorrhagia with regular cycle    Rx / DC Orders ED Discharge Orders          Ordered    HYDROcodone-acetaminophen (NORCO) 5-325 MG tablet  Every 6 hours PRN        02/15/22 2351              Lianne Cure, DO 15/87/27 2351

## 2022-02-15 NOTE — ED Triage Notes (Signed)
R arm and neck pain radiating to chest. X1 day. Pt also reports heavy vaginal bleeding.

## 2022-02-25 ENCOUNTER — Encounter (HOSPITAL_BASED_OUTPATIENT_CLINIC_OR_DEPARTMENT_OTHER): Payer: Self-pay | Admitting: Emergency Medicine

## 2022-02-25 ENCOUNTER — Emergency Department (HOSPITAL_BASED_OUTPATIENT_CLINIC_OR_DEPARTMENT_OTHER)
Admission: EM | Admit: 2022-02-25 | Discharge: 2022-02-25 | Disposition: A | Discharge status: Home / Self Care | Payer: Medicaid Other | Attending: Emergency Medicine | Admitting: Emergency Medicine

## 2022-02-25 DIAGNOSIS — M62838 Other muscle spasm: Secondary | ICD-10-CM

## 2022-02-25 DIAGNOSIS — M25512 Pain in left shoulder: Secondary | ICD-10-CM | POA: Insufficient documentation

## 2022-02-25 DIAGNOSIS — Z79899 Other long term (current) drug therapy: Secondary | ICD-10-CM | POA: Insufficient documentation

## 2022-02-25 DIAGNOSIS — I10 Essential (primary) hypertension: Secondary | ICD-10-CM | POA: Insufficient documentation

## 2022-02-25 MED ORDER — BACLOFEN 10 MG PO TABS: 10.0000 mg | ORAL_TABLET | Freq: Three times a day (TID) | ORAL | 0 refills | Status: DC

## 2022-02-25 NOTE — ED Triage Notes (Signed)
C/o muscle spasms to both arms and pain in her finger tips x 2 weeks. Was seen previously, symptoms persist.

## 2022-02-25 NOTE — ED Provider Notes (Signed)
[facility] EMERGENCY DEPT Provider Note   CSN: [id number] Arrival date & time: 02/25/22  1107     History  Chief Complaint  Patient presents with   Arm Pain    Connie Herrera is a 46 y.o. female.  HPI 46 year old female history of hypertension presents today complaining of muscle spasm to the left trapezius area.  She states that this occurred earlier today.  She is not having the spasm currently after having taken a muscle relaxant but the area is still sore.  She also has some sharp shooting pains to her fingers that rotates to the fingers of both hands.  She was seen here and evaluated for similar symptoms on 1112.  She states that these problems have been somewhat ongoing.  At that time, she was noted to have mild hypokalemia with a potassium of 3.4 and mild anemia with hemoglobin of 9.  She has been started on iron since that time.  She is establishing care with [facility].  She is not having any anterior chest pain, dyspnea, headache, or head injury.  She does not think that this is a repetitive use injury states she has not been doing positive movement recently.     Home Medications Prior to Admission medications   Medication Sig Start Date End Date Taking? Authorizing Provider  baclofen (LIORESAL) 10 MG tablet Take 1 tablet (10 mg total) by mouth 3 (three) times daily. 02/25/22  Yes Pattricia Boss, MD  amLODipine (NORVASC) 5 MG tablet Take 1 tablet (5 mg total) by mouth daily. 10/22/21   Raspet, Derry Skill, PA-C  azelastine (OPTIVAR) 0.05 % ophthalmic solution Place 1 drop into both eyes 2 (two) times daily as needed. 02/11/22   Brunetta Jeans, PA-C  butalbital-acetaminophen-caffeine (FIORICET) [id number] MG tablet Take 1 tablet by mouth every 8 (eight) hours as needed for headache. 10/22/21 10/22/22  Raspet, Derry Skill, PA-C  ferrous sulfate 325 (65 FE) MG EC tablet Take 1 tablet (325 mg total) by mouth 2 (two) times daily. 01/08/22   Benay Pike, MD  ibuprofen (ADVIL) 800 MG tablet Take 1 tablet (800 mg total) by mouth every 8 (eight) hours as needed. 12/22/21   Raspet, Derry Skill, PA-C  metroNIDAZOLE (FLAGYL) 500 MG tablet Take 1 tablet (500 mg total) by mouth 2 (two) times daily. 01/03/22   White, Leitha Schuller, NP  prochlorperazine (COMPAZINE) 10 MG tablet Take 1 tablet (10 mg total) by mouth every 8 (eight) hours as needed (nausea or headache). 10/23/21   Maudie Flakes, MD  hydrochlorothiazide (HYDRODIURIL) 25 MG tablet Take 1 tablet (25 mg total) by mouth daily. Must have office visit for refills 06/20/20 08/13/20  Brunetta Jeans, PA-C      Allergies    Patient has no known allergies.    Review of Systems   Review of Systems  Physical Exam Updated Vital Signs BP (!) 152/99 (BP Location: Right Wrist)   Pulse 75   Temp 98.4 F (36.9 C) (Oral)   Resp 20   Ht 1.702 m ('5\' 7"'$ )   Wt 104.3 kg   LMP 02/09/2022 (Exact Date)   SpO2 100%   BMI 36.02 kg/m  Physical Exam Vitals and nursing note reviewed.  Constitutional:      Appearance: Normal appearance.  HENT:     Head: Normocephalic.     Right Ear: External ear normal.     Left Ear: External ear normal.     Nose: Nose normal.  Mouth/Throat:     Pharynx: Oropharynx is clear.  Eyes:     Extraocular Movements: Extraocular movements intact.     Pupils: Pupils are equal, round, and reactive to light.  Cardiovascular:     Rate and Rhythm: Normal rate and regular rhythm.     Pulses: Normal pulses.     Heart sounds: Normal heart sounds.  Pulmonary:     Effort: Pulmonary effort is normal.     Breath sounds: Normal breath sounds.  Abdominal:     Palpations: Abdomen is soft.  Musculoskeletal:        General: Tenderness present. No swelling. Normal range of motion.     Cervical back: Normal range of motion and neck supple.     Right lower leg: No edema.     Left lower leg: No edema.     Comments: Some tenderness to palpation over left trapezius Pulses are intact  bilateral upper extremities Fingers are pink with sensation intact full active range of motion  Skin:    General: Skin is warm and dry.     Capillary Refill: Capillary refill takes less than 2 seconds.  Neurological:     General: No focal deficit present.     Mental Status: She is alert.  Psychiatric:        Mood and Affect: Mood normal.     ED Results / Procedures / Treatments   Labs (all labs ordered are listed, but only abnormal results are displayed) Labs Reviewed - No data to display  EKG None  Radiology No results found.  Procedures Procedures    Medications Ordered in ED Medications - No data to display  ED Course/ Medical Decision Making/ A&P                           Medical Decision Making 46 year old female presents today complaining of muscle spasm in her left trapezius.  This did resolve with a skeletal muscle relaxant which she has.  I have reviewed her previous labs and noted that she had some mild hypokalemia and anemia on her last visit.  Since that time she has continued on her Norvasc and has started iron.  She is normal heart rate and is somewhat hypertensive here.  I do not think that there would be a significant reason to recheck her hemoglobin today.  Additionally she is not on any diuretics and I would have a low index of suspicion for significant metabolic abnormality that would be causing muscle spasm. Patient is concerned that her skeletal muscle relaxant prescription is somewhat low.  I have a advised her that I will refill this.  She is advised that she should follow-up with her primary care physician for further evaluation, however I do not think there is any emergent medical condition present and did not feel that she needs further evaluation here in the ED today.  We have discussed need for close follow-up and return precautions and she voices understanding.           Final Clinical Impression(s) / ED Diagnoses Final diagnoses:  None     Rx / DC Orders ED Discharge Orders          Ordered    baclofen (LIORESAL) 10 MG tablet  3 times daily        02/25/22 1134              Pattricia Boss, MD 02/25/22 1135

## 2022-02-25 NOTE — ED Notes (Signed)
Discharge instructions, prescription, and follow up care reviewed and explained, pt verbalized understanding and had no further questions after speaking with Dr. Jeanell Sparrow. Pt caox4 and ambulatory on d/c.

## 2022-02-25 NOTE — Discharge Instructions (Signed)
It appears that you are having a muscle spasm.  These can be very painful.  However, if you have new symptoms such as shortness of breath, chest pain, or weakness, you should be reevaluated. Please follow-up with your primary care provider.  You have been given a prescription of baclofen which is the same muscle relaxant that you have been taking.  You can go ahead and refill this prescription when your other 1 runs out.

## 2022-02-27 ENCOUNTER — Telehealth: Payer: Self-pay | Admitting: Physician Assistant

## 2022-02-27 DIAGNOSIS — H1031 Unspecified acute conjunctivitis, right eye: Secondary | ICD-10-CM

## 2022-02-27 MED ORDER — POLYMYXIN B-TRIMETHOPRIM 10000-0.1 UNIT/ML-% OP SOLN: 1.0000 [drp] | OPHTHALMIC | 0 refills | Status: DC

## 2022-02-27 NOTE — Patient Instructions (Signed)
[name], thank you for joining Mar Daring, PA-C for today's virtual visit.  While this provider is not your primary care provider (PCP), if your PCP is located in our provider database this encounter information will be shared with them immediately following your visit.   [facility] account gives you access to today's visit and all your visits, tests, and labs performed at [facility] " click here if you don't have a [facility] account or go to [email or web address]  Consent: (Patient) [name] provided verbal consent for this virtual visit at the beginning of the encounter.  Current Medications:  Current Outpatient Medications:    trimethoprim-polymyxin b (POLYTRIM) ophthalmic solution, Place 1 drop into the right eye every 4 (four) hours. X 5 days, Disp: 10 mL, Rfl: 0   amLODipine (NORVASC) 5 MG tablet, Take 1 tablet (5 mg total) by mouth daily., Disp: 30 tablet, Rfl: 2   azelastine (OPTIVAR) 0.05 % ophthalmic solution, Place 1 drop into both eyes 2 (two) times daily as needed., Disp: 6 mL, Rfl: 1   baclofen (LIORESAL) 10 MG tablet, Take 1 tablet (10 mg total) by mouth 3 (three) times daily., Disp: 30 each, Rfl: 0   butalbital-acetaminophen-caffeine (FIORICET) 50-325-40 MG tablet, Take 1 tablet by mouth every 8 (eight) hours as needed for headache., Disp: 2 tablet, Rfl: 0   ferrous sulfate 325 (65 FE) MG EC tablet, Take 1 tablet (325 mg total) by mouth 2 (two) times daily., Disp: 60 tablet, Rfl: 3   ibuprofen (ADVIL) 800 MG tablet, Take 1 tablet (800 mg total) by mouth every 8 (eight) hours as needed., Disp: 12 tablet, Rfl: 0   metroNIDAZOLE (FLAGYL) 500 MG tablet, Take 1 tablet (500 mg total) by mouth 2 (two) times daily., Disp: 14 tablet, Rfl: 0   prochlorperazine (COMPAZINE) 10 MG tablet, Take 1 tablet (10 mg total) by mouth every 8 (eight) hours as needed (nausea or headache)., Disp: 20 tablet, Rfl: 0   Medications  ordered in this encounter:  Meds ordered this encounter  Medications   trimethoprim-polymyxin b (POLYTRIM) ophthalmic solution    Sig: Place 1 drop into the right eye every 4 (four) hours. X 5 days    Dispense:  10 mL    Refill:  0    Order Specific Question:   Supervising Provider    Answer:   Chase Picket [[id number]]     *If you need refills on other medications prior to your next appointment, please contact your pharmacy*  Follow-Up: Call back or seek an in-person evaluation if the symptoms worsen or if the condition fails to improve as anticipated.  [facility] [phone]  Other Instructions Bacterial Conjunctivitis, Adult Bacterial conjunctivitis is an infection of your conjunctiva. This is the clear membrane that covers the white part of your eye and the inner part of your eyelid. This infection can make your eye: Red or pink. Itchy or irritated. This condition spreads easily from person to person (is contagious) and from one eye to the other eye. What are the causes? This condition is caused by germs (bacteria). You may get the infection if you come into close contact with: A person who has the infection. Items that have germs on them (are contaminated), such as face towels, contact lens solution, or eye makeup. What increases the risk? You are more likely to get this condition if: You have contact with people who have the infection. You wear contact lenses.  You have a sinus infection. You have had a recent eye injury or surgery. You have a weak body defense system (immune system). You have dry eyes. What are the signs or symptoms?  Thick, yellowish discharge from the eye. Tearing or watery eyes. Itchy eyes. Burning feeling in your eyes. Eye redness. Swollen eyelids. Blurred vision. How is this treated?  Antibiotic eye drops or ointment. Antibiotic medicine taken by mouth. This is used for infections that do not get better with drops or  ointment or that last more than 10 days. Cool, wet cloths placed on the eyes. Artificial tears used 2-6 times a day. Follow these instructions at home: Medicines Take or apply your antibiotic medicine as told by your doctor. Do not stop using it even if you start to feel better. Take or apply over-the-counter and prescription medicines only as told by your doctor. Do not touch your eyelid with the eye-drop bottle or the ointment tube. Managing discomfort Wipe any fluid from your eye with a warm, wet washcloth or a cotton ball. Place a clean, cool, wet cloth on your eye. Do this for 10-20 minutes, 3-4 times a day. General instructions Do not wear contacts until the infection is gone. Wear glasses until your doctor says it is okay to wear contacts again. Do not wear eye makeup until the infection is gone. Throw away old eye makeup. Change or wash your pillowcase every day. Do not share towels or washcloths. Wash your hands often with soap and water for at least 20 seconds and especially before touching your face or eyes. Use paper towels to dry your hands. Do not touch or rub your eyes. Do not drive or use heavy machinery if your vision is blurred. Contact a doctor if: You have a fever. You do not get better after 10 days. Get help right away if: You have a fever and your symptoms get worse all of a sudden. You have very bad pain when you move your eye. Your face: Hurts. Is red. Is swollen. You have sudden loss of vision. Summary Bacterial conjunctivitis is an infection of your conjunctiva. This infection spreads easily from person to person. Wash your hands often with soap and water for at least 20 seconds and especially before touching your face or eyes. Use paper towels to dry your hands. Take or apply your antibiotic medicine as told by your doctor. Contact a doctor if you have a fever or you do not get better after 10 days. This information is not intended to replace advice  given to you by your health care provider. Make sure you discuss any questions you have with your health care provider. Document Revised: 07/03/2020 Document Reviewed: 07/03/2020 Elsevier Patient Education  [place].    If you have been instructed to have an in-person evaluation today at a local Urgent Care facility, please use the link below. It will take you to a list of all of our available [facility], including address, phone number and hours of operation. Please do not delay care.  [facility]  If you or a family member do not have a primary care provider, use the link below to schedule a visit and establish care. When you choose a [facility] primary care physician or advanced practice provider, you gain a long-term partner in health. Find a Primary Care Provider  Learn more about [facility]'s in-office and virtual care options: [facility]

## 2022-02-27 NOTE — Progress Notes (Signed)
Virtual Visit Consent   Connie Herrera, you are scheduled for a virtual visit with a [facility] provider today. Just as with appointments in the office, your consent must be obtained to participate. Your consent will be active for this visit and any virtual visit you may have with one of our providers in the next 365 days. If you have a MyChart account, a copy of this consent can be sent to you electronically.  As this is a virtual visit, video technology does not allow for your provider to perform a traditional examination. This may limit your provider's ability to fully assess your condition. If your provider identifies any concerns that need to be evaluated in person or the need to arrange testing (such as labs, EKG, etc.), we will make arrangements to do so. Although advances in technology are sophisticated, we cannot ensure that it will always work on either your end or our end. If the connection with a video visit is poor, the visit may have to be switched to a telephone visit. With either a video or telephone visit, we are not always able to ensure that we have a secure connection.  By engaging in this virtual visit, you consent to the provision of healthcare and authorize for your insurance to be billed (if applicable) for the services provided during this visit. Depending on your insurance coverage, you may receive a charge related to this service.  I need to obtain your verbal consent now. Are you willing to proceed with your visit today? Connie Herrera has provided verbal consent on 02/27/2022 for a virtual visit (video or telephone). Mar Daring, PA-C  Date: 02/27/2022 10:09 AM  Virtual Visit via Video Note   I, Mar Daring, connected with  Connie Herrera  ([id number], [birth year]) on 02/27/22 at 10:00 AM EST by a video-enabled telemedicine application and verified that I am speaking with the correct person using two identifiers.  Location: Patient:  Virtual Visit Location Patient: Home Provider: Virtual Visit Location Provider: Home Office   I discussed the limitations of evaluation and management by telemedicine and the availability of in person appointments. The patient expressed understanding and agreed to proceed.    History of Present Illness: Connie Herrera is a 46 y.o. who identifies as a female who was assigned female at birth, and is being seen today for possible pink eye.  HPI: Conjunctivitis  The current episode started 2 days ago. The onset was sudden. The problem occurs continuously. The problem has been gradually worsening. The problem is mild. Nothing relieves the symptoms. Nothing aggravates the symptoms. Associated symptoms include decreased vision, eye itching, eye discharge and eye redness. Pertinent negatives include no fever, no double vision, no photophobia, no congestion, no headaches, no rhinorrhea, no sore throat and no eye pain. The eye pain is mild. The right eye is affected. The eyelid exhibits no abnormality.      Problems:  Patient Active Problem List   Diagnosis Date Noted   Muscle spasms of both lower extremities 08/14/2020   Fibroid uterus 08/14/2020   Anemia due to blood loss, chronic 01/10/2020   Menorrhagia with regular cycle 01/09/2020   Class 1 obesity due to excess calories with body mass index (BMI) of 34.0 to 34.9 in adult 01/09/2020   Screening breast examination 02/07/2019   Breast pain 02/07/2019   Hypertension 01/26/2019   Paresthesias 08/04/2013   Breast pain, right 03/11/2012    Allergies: No Known Allergies Medications:  Current Outpatient  Medications:    trimethoprim-polymyxin b (POLYTRIM) ophthalmic solution, Place 1 drop into the right eye every 4 (four) hours. X 5 days, Disp: 10 mL, Rfl: 0   amLODipine (NORVASC) 5 MG tablet, Take 1 tablet (5 mg total) by mouth daily., Disp: 30 tablet, Rfl: 2   azelastine (OPTIVAR) 0.05 % ophthalmic solution, Place 1 drop into both eyes 2  (two) times daily as needed., Disp: 6 mL, Rfl: 1   baclofen (LIORESAL) 10 MG tablet, Take 1 tablet (10 mg total) by mouth 3 (three) times daily., Disp: 30 each, Rfl: 0   butalbital-acetaminophen-caffeine (FIORICET) 50-325-40 MG tablet, Take 1 tablet by mouth every 8 (eight) hours as needed for headache., Disp: 2 tablet, Rfl: 0   ferrous sulfate 325 (65 FE) MG EC tablet, Take 1 tablet (325 mg total) by mouth 2 (two) times daily., Disp: 60 tablet, Rfl: 3   ibuprofen (ADVIL) 800 MG tablet, Take 1 tablet (800 mg total) by mouth every 8 (eight) hours as needed., Disp: 12 tablet, Rfl: 0   metroNIDAZOLE (FLAGYL) 500 MG tablet, Take 1 tablet (500 mg total) by mouth 2 (two) times daily., Disp: 14 tablet, Rfl: 0   prochlorperazine (COMPAZINE) 10 MG tablet, Take 1 tablet (10 mg total) by mouth every 8 (eight) hours as needed (nausea or headache)., Disp: 20 tablet, Rfl: 0  Observations/Objective: Patient is well-developed, well-nourished in no acute distress.  Resting comfortably at home.  Head is normocephalic, atraumatic.  No labored breathing.  Speech is clear and coherent with logical content.  Patient is alert and oriented at baseline.    Assessment and Plan: 1. Acute bacterial conjunctivitis of both eyes - trimethoprim-polymyxin b (POLYTRIM) ophthalmic solution; Place 1 drop into the right eye every 4 (four) hours. X 5 days  Dispense: 10 mL; Refill: 0  - Suspect bacterial conjunctivitis - Polytrim prescribed - Warm compresses - Good hand hygiene - Seek in person evaluation if symptoms worsen or fail to improve   Follow Up Instructions: I discussed the assessment and treatment plan with the patient. The patient was provided an opportunity to ask questions and all were answered. The patient agreed with the plan and demonstrated an understanding of the instructions.  A copy of instructions were sent to the patient via MyChart unless otherwise noted below.    The patient was advised to call  back or seek an in-person evaluation if the symptoms worsen or if the condition fails to improve as anticipated.  Time:  I spent 10 minutes with the patient via telehealth technology discussing the above problems/concerns.    Mar Daring, PA-C

## 2022-03-12 ENCOUNTER — Telehealth: Payer: Medicaid Other | Admitting: Nurse Practitioner

## 2022-03-12 ENCOUNTER — Telehealth: Payer: Medicaid Other | Admitting: Physician Assistant

## 2022-03-12 ENCOUNTER — Telehealth: Payer: Medicaid Other | Admitting: Family Medicine

## 2022-03-12 DIAGNOSIS — A6004 Herpesviral vulvovaginitis: Secondary | ICD-10-CM

## 2022-03-12 DIAGNOSIS — R200 Anesthesia of skin: Secondary | ICD-10-CM

## 2022-03-12 MED ORDER — VALACYCLOVIR HCL 500 MG PO TABS: 500.0000 mg | ORAL_TABLET | Freq: Two times a day (BID) | ORAL | 1 refills | Status: DC

## 2022-03-12 NOTE — Patient Instructions (Signed)
Connie Herrera, thank you for joining [name], NP for today's virtual visit.  While this provider is not your primary care provider (PCP), if your PCP is located in our provider database this encounter information will be shared with them immediately following your visit.   [facility] account gives you access to today's visit and all your visits, tests, and labs performed at [facility] " click here if you don't have a [facility] account or go to [email or web address]  Consent: (Patient) Connie Herrera provided verbal consent for this virtual visit at the beginning of the encounter.  Current Medications:  Current Outpatient Medications:    valACYclovir (VALTREX) 500 MG tablet, Take 1 tablet (500 mg total) by mouth 2 (two) times daily. For herpes outbreak, Disp: 6 tablet, Rfl: 1   amLODipine (NORVASC) 5 MG tablet, Take 1 tablet (5 mg total) by mouth daily., Disp: 30 tablet, Rfl: 2   azelastine (OPTIVAR) 0.05 % ophthalmic solution, Place 1 drop into both eyes 2 (two) times daily as needed., Disp: 6 mL, Rfl: 1   baclofen (LIORESAL) 10 MG tablet, Take 1 tablet (10 mg total) by mouth 3 (three) times daily., Disp: 30 each, Rfl: 0   butalbital-acetaminophen-caffeine (FIORICET) 50-325-40 MG tablet, Take 1 tablet by mouth every 8 (eight) hours as needed for headache., Disp: 2 tablet, Rfl: 0   ferrous sulfate 325 (65 FE) MG EC tablet, Take 1 tablet (325 mg total) by mouth 2 (two) times daily., Disp: 60 tablet, Rfl: 3   ibuprofen (ADVIL) 800 MG tablet, Take 1 tablet (800 mg total) by mouth every 8 (eight) hours as needed., Disp: 12 tablet, Rfl: 0   metroNIDAZOLE (FLAGYL) 500 MG tablet, Take 1 tablet (500 mg total) by mouth 2 (two) times daily., Disp: 14 tablet, Rfl: 0   prochlorperazine (COMPAZINE) 10 MG tablet, Take 1 tablet (10 mg total) by mouth every 8 (eight) hours as needed (nausea or headache)., Disp: 20 tablet, Rfl: 0   trimethoprim-polymyxin  b (POLYTRIM) ophthalmic solution, Place 1 drop into the right eye every 4 (four) hours. X 5 days, Disp: 10 mL, Rfl: 0   Medications ordered in this encounter:  Meds ordered this encounter  Medications   valACYclovir (VALTREX) 500 MG tablet    Sig: Take 1 tablet (500 mg total) by mouth 2 (two) times daily. For herpes outbreak    Dispense:  6 tablet    Refill:  1    Order Specific Question:   Supervising Provider    Answer:   Chase Picket [id number]     *If you need refills on other medications prior to your next appointment, please contact your pharmacy*  Follow-Up: Call back or seek an in-person evaluation if the symptoms worsen or if the condition fails to improve as anticipated.  [facility] [phone]   If you have been instructed to have an in-person evaluation today at a local Urgent Care facility, please use the link below. It will take you to a list of all of our available [facility], including address, phone number and hours of operation. Please do not delay care.  [facility]  If you or a family member do not have a primary care provider, use the link below to schedule a visit and establish care. When you choose a [facility] primary care physician or advanced practice provider, you gain a long-term partner in health. Find a Primary Care Provider  Learn more about [facility]'s in-office and virtual care options: [facility]

## 2022-03-12 NOTE — Progress Notes (Signed)
Virtual Visit Consent   Connie Herrera, you are scheduled for a virtual visit with a [facility] provider today. Just as with appointments in the office, your consent must be obtained to participate. Your consent will be active for this visit and any virtual visit you may have with one of our providers in the next 365 days. If you have a MyChart account, a copy of this consent can be sent to you electronically.  As this is a virtual visit, video technology does not allow for your provider to perform a traditional examination. This may limit your provider's ability to fully assess your condition. If your provider identifies any concerns that need to be evaluated in person or the need to arrange testing (such as labs, EKG, etc.), we will make arrangements to do so. Although advances in technology are sophisticated, we cannot ensure that it will always work on either your end or our end. If the connection with a video visit is poor, the visit may have to be switched to a telephone visit. With either a video or telephone visit, we are not always able to ensure that we have a secure connection.  By engaging in this virtual visit, you consent to the provision of healthcare and authorize for your insurance to be billed (if applicable) for the services provided during this visit. Depending on your insurance coverage, you may receive a charge related to this service.  I need to obtain your verbal consent now. Are you willing to proceed with your visit today? Connie Herrera has provided verbal consent on 03/12/2022 for a virtual visit (video or telephone). [name], NP  Date: 03/12/2022 5:57 PM  Virtual Visit via Video Note   I, [name], connected with  Connie Herrera  ([id number], 06/24/1975) on 03/12/22 at  5:45 PM EST by a video-enabled telemedicine application and verified that I am speaking with the correct person using two identifiers.  Location: Patient: Virtual Visit  Location Patient: Home Provider: Virtual Visit Location Provider: Home Office   I discussed the limitations of evaluation and management by telemedicine and the availability of in person appointments. The patient expressed understanding and agreed to proceed.    History of Present Illness: Connie Herrera is a 46 y.o. who identifies as a female who was assigned female at birth, and is being seen today for HSV.   [name] is requesting a refill of valtrex for genital HSV outbreak.  She has a history of herpes genitalis and is currently not on any suppressive therapy   Problems:  Patient Active Problem List   Diagnosis Date Noted   Muscle spasms of both lower extremities 08/14/2020   Fibroid uterus 08/14/2020   Anemia due to blood loss, chronic 01/10/2020   Menorrhagia with regular cycle 01/09/2020   Class 1 obesity due to excess calories with body mass index (BMI) of 34.0 to 34.9 in adult 01/09/2020   Screening breast examination 02/07/2019   Breast pain 02/07/2019   Hypertension 01/26/2019   Paresthesias 08/04/2013   Breast pain, right 03/11/2012    Allergies: No Known Allergies Medications:  Current Outpatient Medications:    valACYclovir (VALTREX) 500 MG tablet, Take 1 tablet (500 mg total) by mouth 2 (two) times daily. For herpes outbreak, Disp: 6 tablet, Rfl: 1   amLODipine (NORVASC) 5 MG tablet, Take 1 tablet (5 mg total) by mouth daily., Disp: 30 tablet, Rfl: 2   azelastine (OPTIVAR) 0.05 % ophthalmic solution, Place 1 drop  into both eyes 2 (two) times daily as needed., Disp: 6 mL, Rfl: 1   baclofen (LIORESAL) 10 MG tablet, Take 1 tablet (10 mg total) by mouth 3 (three) times daily., Disp: 30 each, Rfl: 0   butalbital-acetaminophen-caffeine (FIORICET) 50-325-40 MG tablet, Take 1 tablet by mouth every 8 (eight) hours as needed for headache., Disp: 2 tablet, Rfl: 0   ferrous sulfate 325 (65 FE) MG EC tablet, Take 1 tablet (325 mg total) by mouth 2 (two) times daily.,  Disp: 60 tablet, Rfl: 3   ibuprofen (ADVIL) 800 MG tablet, Take 1 tablet (800 mg total) by mouth every 8 (eight) hours as needed., Disp: 12 tablet, Rfl: 0   metroNIDAZOLE (FLAGYL) 500 MG tablet, Take 1 tablet (500 mg total) by mouth 2 (two) times daily., Disp: 14 tablet, Rfl: 0   prochlorperazine (COMPAZINE) 10 MG tablet, Take 1 tablet (10 mg total) by mouth every 8 (eight) hours as needed (nausea or headache)., Disp: 20 tablet, Rfl: 0   trimethoprim-polymyxin b (POLYTRIM) ophthalmic solution, Place 1 drop into the right eye every 4 (four) hours. X 5 days, Disp: 10 mL, Rfl: 0  Observations/Objective: Patient is well-developed, well-nourished in no acute distress.  Resting comfortably at home.  Head is normocephalic, atraumatic.  No labored breathing.  Speech is clear and coherent with logical content.  Patient is alert and oriented at baseline.    Assessment and Plan: 1. Herpes simplex vulvovaginitis - valACYclovir (VALTREX) 500 MG tablet; Take 1 tablet (500 mg total) by mouth 2 (two) times daily. For herpes outbreak  Dispense: 6 tablet; Refill: 1    Follow Up Instructions: I discussed the assessment and treatment plan with the patient. The patient was provided an opportunity to ask questions and all were answered. The patient agreed with the plan and demonstrated an understanding of the instructions.  A copy of instructions were sent to the patient via MyChart unless otherwise noted below.    The patient was advised to call back or seek an in-person evaluation if the symptoms worsen or if the condition fails to improve as anticipated.  Time:  I spent 12 minutes with the patient via telehealth technology discussing the above problems/concerns.    [name], NP

## 2022-03-12 NOTE — Progress Notes (Signed)
The patient no-showed for appointment despite this provider sending direct link, reaching out via phone with no response and waiting for at least 10 minutes from appointment time for patient to join. They will be marked as a NS for this appointment/time.  ? ?Abdirizak Richison Cody Tarence Searcy, PA-C ? ? ? ?

## 2022-03-12 NOTE — Progress Notes (Signed)
Connie Herrera   Encouraged to be seen in person given the on going finger tip pain and numbness.  Patient acknowledged agreement and understanding of the plan.

## 2022-03-19 ENCOUNTER — Inpatient Hospital Stay: Payer: Medicaid Other | Attending: Hematology and Oncology

## 2022-03-23 ENCOUNTER — Ambulatory Visit: Payer: Self-pay

## 2022-03-23 NOTE — Telephone Encounter (Signed)
  Chief Complaint: numbness Symptoms: numbness and tingling in hands and arms and mouth numbness more on one side than the other.  Frequency: 3 weeks  Pertinent Negatives: NA Disposition: '[x]'$ ED /'[]'$ Urgent Care (no appt availability in office) / '[]'$ Appointment(In office/virtual)/ '[]'$  [facility]/ '[]'$ Home Care/ '[]'$ Refused Recommended Disposition /'[]'$ [facility] [facility]/ '[]'$  Follow-up with PCP Additional Notes: pt states that she has had numbness in hands that is worse in the mornings but has been getting worse and going to lips and throat. Pt states she was at [facility] ED and wanted to see if she could get appt for FU and OBGYN referral. I advised pt to go ahead and be seen and then once dc'd call back to scheduled FU appt. Pt verbalized understanding.   Reason for Disposition  [1] Numbness (i.e., loss of sensation) of the face, arm / hand, or leg / foot on one side of the body AND [2] sudden onset AND [3] brief (now gone)  Answer Assessment - Initial Assessment Questions 1. SYMPTOM: "What is the main symptom you are concerned about?" (e.g., weakness, numbness)     Numbness and tingling  2. ONSET: "When did this start?" (minutes, hours, days; while sleeping)     3 weeks  4. PATTERN "Does this come and go, or has it been constant since it started?"  "Is it present now?"     Comes and goes, mornings is worse  6. NEUROLOGIC SYMPTOMS: "Have you had any of the following symptoms: headache, dizziness, vision loss, double vision, changes in speech, unsteady on your feet?"     HA 7. OTHER SYMPTOMS: "Do you have any other symptoms?"     In hands/arms, lips, throat  Protocols used: Neurologic Deficit-A-AH

## 2022-04-02 ENCOUNTER — Ambulatory Visit: Payer: Self-pay | Admitting: *Deleted

## 2022-04-02 ENCOUNTER — Ambulatory Visit (HOSPITAL_COMMUNITY)
Admission: EM | Admit: 2022-04-02 | Discharge: 2022-04-02 | Discharge status: Against Medical Advice | Payer: Medicaid Other

## 2022-04-02 NOTE — ED Notes (Signed)
Called pts phone went to VM, called pt from lobby no answer.

## 2022-04-02 NOTE — Telephone Encounter (Signed)
Reason for Disposition  Patient sounds very sick or weak to the triager    Referred to the ED until able to be established with Primary Care at [facility] on 05/29/2022.  Answer Assessment - Initial Assessment Questions 1. ONSET: "When did the pain start?"     I'm having pain all over my body.  I'm having muscle spasms.   Pain in arms and legs.   It's getting worse.    I can feel my blood trying to pump all over my body.    I have low blood iron.   2. LOCATION: "Where is the pain located?"      3. PAIN: "How bad is the pain?" (Scale 1-10; or mild, moderate, severe)   - MILD (1-3): Doesn't interfere with normal activities.   - MODERATE (4-7): Interferes with normal activities (e.g., work or school) or awakens from sleep.   - SEVERE (8-10): Excruciating pain, unable to do any normal activities, unable to hold a cup of water.     In my arms and legs.  I'm hurting so long.   I've been to urgent cares and EDs for this.    I can't wait until Feb. 23rd for my new pt appt.       What should I do?    I referred her to the urgent care.   I let her know they can do blood work and all there since that's what she is wanting mostly. 4. WORK OR EXERCISE: "Has there been any recent work or exercise that involved this part of the body?"     No 5. CAUSE: "What do you think is causing the arm pain?"     I don't know.    No one has been able to tell me what is wrong. 6. OTHER SYMPTOMS: "Do you have any other symptoms?" (e.g., neck pain, swelling, rash, fever, numbness, weakness)     Numbness and tingling in my arms, legs and muscle spasms all over my body at various places especially in my arms and legs. 7. PREGNANCY: "Is there any chance you are pregnant?" "When was your last menstrual period?"     Not asked  Protocols used: Arm Pain-A-AH

## 2022-04-02 NOTE — ED Notes (Signed)
Called on phone went straight to voicemail that hasn't been set up. No answer from lobby

## 2022-04-02 NOTE — ED Notes (Signed)
Called patient via phone. States that she had an emergency and had to leave.

## 2022-04-02 NOTE — Telephone Encounter (Signed)
  Chief Complaint: muscle spasms occurring all over her body at various times but especially in her arms and legs as an ongoing problem.   She has an appt to establish care at primary Care at [facility] for 05/29/2022.  Symptoms: numbness/tingling in arms and legs.   Said she has been to several EDs and urgent cares and no one can help her. Frequency: "For a long time now" Pertinent Negatives: Patient denies anyone telling her what is causing the pain. Disposition: '[]'$ ED /'[x]'$ Urgent Care (no appt availability in office) / '[]'$ Appointment(In office/virtual)/ '[]'$  [facility]/ '[]'$ Home Care/ '[]'$ Refused Recommended Disposition /'[]'$ [facility]/ '[]'$  Follow-up with PCP Additional Notes: Pt agreeable to going to the urgent care today.

## 2022-04-06 DIAGNOSIS — I639 Cerebral infarction, unspecified: Secondary | ICD-10-CM

## 2022-04-06 HISTORY — DX: Cerebral infarction, unspecified: I63.9

## 2022-04-08 ENCOUNTER — Other Ambulatory Visit: Payer: Self-pay | Admitting: Nurse Practitioner

## 2022-04-08 DIAGNOSIS — A6004 Herpesviral vulvovaginitis: Secondary | ICD-10-CM

## 2022-04-08 NOTE — Progress Notes (Unsigned)
  Subjective:    Connie Herrera - 47 y.o. female MRN [id number]  Date of birth: 1975-11-03  HPI  Connie Herrera is to establish care.   Current issues and/or concerns: Hematology - anemia  Neurology   ROS per HPI     Health Maintenance:  Health Maintenance Due  Topic Date Due   COVID-19 Vaccine (1) Never done   DTaP/Tdap/Td (1 - Tdap) Never done   COLONOSCOPY (Pts 45-42yr Insurance coverage will need to be confirmed)  Never done   INFLUENZA VACCINE  Never done     Past Medical History: Patient Active Problem List   Diagnosis Date Noted   Muscle spasms of both lower extremities 08/14/2020   Fibroid uterus 08/14/2020   Anemia due to blood loss, chronic 01/10/2020   Menorrhagia with regular cycle 01/09/2020   Class 1 obesity due to excess calories with body mass index (BMI) of 34.0 to 34.9 in adult 01/09/2020   Screening breast examination 02/07/2019   Breast pain 02/07/2019   Hypertension 01/26/2019   Paresthesias 08/04/2013   Breast pain, right 03/11/2012      Social History   reports that she quit smoking about 4 years ago. Her smoking use included cigarettes. She has a 2.00 pack-year smoking history. She has never used smokeless tobacco. She reports that she does not currently use drugs after having used the following drugs: Marijuana. She reports that she does not drink alcohol.   Family History  family history includes Breast cancer in her maternal grandmother; Cancer (age of onset: 723 in her maternal grandmother; Cirrhosis in her father; Healthy in her mother.   Medications: reviewed and updated   Objective:   Physical Exam There were no vitals taken for this visit. Physical Exam      Assessment & Plan:         Patient was given clear instructions to go to Emergency Department or return to medical center if symptoms don't improve, worsen, or new problems develop.The patient verbalized understanding.  I discussed the assessment and  treatment plan with the patient. The patient was provided an opportunity to ask questions and all were answered. The patient agreed with the plan and demonstrated an understanding of the instructions.   The patient was advised to call back or seek an in-person evaluation if the symptoms worsen or if the condition fails to improve as anticipated.    ADurene Fruits NP 04/08/2022, 1:14 PM [facility]

## 2022-04-09 ENCOUNTER — Other Ambulatory Visit: Payer: Self-pay

## 2022-04-09 ENCOUNTER — Inpatient Hospital Stay (HOSPITAL_COMMUNITY)
Admission: EM | Admit: 2022-04-09 | Discharge: 2022-04-17 | DRG: 252 | Disposition: A | Discharge status: Home / Self Care | Payer: BLUE CROSS/BLUE SHIELD | Attending: Internal Medicine | Admitting: Internal Medicine

## 2022-04-09 ENCOUNTER — Ambulatory Visit (INDEPENDENT_AMBULATORY_CARE_PROVIDER_SITE_OTHER): Payer: BLUE CROSS/BLUE SHIELD | Admitting: Family

## 2022-04-09 ENCOUNTER — Observation Stay (HOSPITAL_COMMUNITY): Payer: BLUE CROSS/BLUE SHIELD

## 2022-04-09 ENCOUNTER — Emergency Department (HOSPITAL_COMMUNITY): Payer: BLUE CROSS/BLUE SHIELD

## 2022-04-09 ENCOUNTER — Encounter (HOSPITAL_COMMUNITY): Payer: Self-pay | Admitting: Internal Medicine

## 2022-04-09 ENCOUNTER — Encounter: Payer: Self-pay | Admitting: Family

## 2022-04-09 VITALS — BP 153/103 | HR 87 | Ht 66.0 in | Wt 235.0 lb

## 2022-04-09 DIAGNOSIS — R9082 White matter disease, unspecified: Secondary | ICD-10-CM | POA: Diagnosis present

## 2022-04-09 DIAGNOSIS — R253 Fasciculation: Secondary | ICD-10-CM

## 2022-04-09 DIAGNOSIS — E871 Hypo-osmolality and hyponatremia: Secondary | ICD-10-CM | POA: Diagnosis not present

## 2022-04-09 DIAGNOSIS — G939 Disorder of brain, unspecified: Secondary | ICD-10-CM | POA: Diagnosis not present

## 2022-04-09 DIAGNOSIS — M79641 Pain in right hand: Secondary | ICD-10-CM | POA: Diagnosis not present

## 2022-04-09 DIAGNOSIS — R200 Anesthesia of skin: Secondary | ICD-10-CM

## 2022-04-09 DIAGNOSIS — I749 Embolism and thrombosis of unspecified artery: Secondary | ICD-10-CM | POA: Diagnosis present

## 2022-04-09 DIAGNOSIS — M79605 Pain in left leg: Secondary | ICD-10-CM

## 2022-04-09 DIAGNOSIS — D259 Leiomyoma of uterus, unspecified: Secondary | ICD-10-CM | POA: Diagnosis present

## 2022-04-09 DIAGNOSIS — M79604 Pain in right leg: Secondary | ICD-10-CM | POA: Diagnosis not present

## 2022-04-09 DIAGNOSIS — I10 Essential (primary) hypertension: Secondary | ICD-10-CM

## 2022-04-09 DIAGNOSIS — F1210 Cannabis abuse, uncomplicated: Secondary | ICD-10-CM | POA: Diagnosis present

## 2022-04-09 DIAGNOSIS — R29898 Other symptoms and signs involving the musculoskeletal system: Secondary | ICD-10-CM

## 2022-04-09 DIAGNOSIS — Z9851 Tubal ligation status: Secondary | ICD-10-CM

## 2022-04-09 DIAGNOSIS — D1800 Hemangioma unspecified site: Secondary | ICD-10-CM

## 2022-04-09 DIAGNOSIS — G4700 Insomnia, unspecified: Secondary | ICD-10-CM | POA: Diagnosis present

## 2022-04-09 DIAGNOSIS — Z6837 Body mass index (BMI) 37.0-37.9, adult: Secondary | ICD-10-CM

## 2022-04-09 DIAGNOSIS — Z87891 Personal history of nicotine dependence: Secondary | ICD-10-CM

## 2022-04-09 DIAGNOSIS — E041 Nontoxic single thyroid nodule: Secondary | ICD-10-CM | POA: Diagnosis present

## 2022-04-09 DIAGNOSIS — Z8616 Personal history of COVID-19: Secondary | ICD-10-CM

## 2022-04-09 DIAGNOSIS — E876 Hypokalemia: Secondary | ICD-10-CM | POA: Diagnosis not present

## 2022-04-09 DIAGNOSIS — I6920 Unspecified sequelae of other nontraumatic intracranial hemorrhage: Secondary | ICD-10-CM

## 2022-04-09 DIAGNOSIS — D509 Iron deficiency anemia, unspecified: Secondary | ICD-10-CM

## 2022-04-09 DIAGNOSIS — F419 Anxiety disorder, unspecified: Secondary | ICD-10-CM | POA: Diagnosis present

## 2022-04-09 DIAGNOSIS — R531 Weakness: Secondary | ICD-10-CM | POA: Diagnosis present

## 2022-04-09 DIAGNOSIS — M79642 Pain in left hand: Secondary | ICD-10-CM

## 2022-04-09 DIAGNOSIS — Z803 Family history of malignant neoplasm of breast: Secondary | ICD-10-CM

## 2022-04-09 DIAGNOSIS — Z79899 Other long term (current) drug therapy: Secondary | ICD-10-CM

## 2022-04-09 DIAGNOSIS — I776 Arteritis, unspecified: Secondary | ICD-10-CM | POA: Diagnosis present

## 2022-04-09 DIAGNOSIS — D500 Iron deficiency anemia secondary to blood loss (chronic): Secondary | ICD-10-CM | POA: Diagnosis present

## 2022-04-09 DIAGNOSIS — A6004 Herpesviral vulvovaginitis: Secondary | ICD-10-CM

## 2022-04-09 DIAGNOSIS — D1802 Hemangioma of intracranial structures: Secondary | ICD-10-CM | POA: Diagnosis present

## 2022-04-09 DIAGNOSIS — H532 Diplopia: Secondary | ICD-10-CM

## 2022-04-09 DIAGNOSIS — Z7689 Persons encountering health services in other specified circumstances: Secondary | ICD-10-CM

## 2022-04-09 DIAGNOSIS — I748 Embolism and thrombosis of other arteries: Principal | ICD-10-CM | POA: Diagnosis present

## 2022-04-09 DIAGNOSIS — I6389 Other cerebral infarction: Secondary | ICD-10-CM | POA: Diagnosis present

## 2022-04-09 DIAGNOSIS — R202 Paresthesia of skin: Secondary | ICD-10-CM

## 2022-04-09 DIAGNOSIS — E669 Obesity, unspecified: Secondary | ICD-10-CM | POA: Diagnosis present

## 2022-04-09 DIAGNOSIS — K5900 Constipation, unspecified: Secondary | ICD-10-CM | POA: Diagnosis present

## 2022-04-09 DIAGNOSIS — R29702 NIHSS score 2: Secondary | ICD-10-CM | POA: Diagnosis present

## 2022-04-09 DIAGNOSIS — I639 Cerebral infarction, unspecified: Secondary | ICD-10-CM | POA: Insufficient documentation

## 2022-04-09 DIAGNOSIS — M329 Systemic lupus erythematosus, unspecified: Secondary | ICD-10-CM | POA: Diagnosis present

## 2022-04-09 DIAGNOSIS — N920 Excessive and frequent menstruation with regular cycle: Secondary | ICD-10-CM | POA: Diagnosis present

## 2022-04-09 LAB — I-STAT BETA HCG BLOOD, ED (MC, WL, AP ONLY): I-stat hCG, quantitative: <5 m[IU]/mL (ref <5)

## 2022-04-09 LAB — CSF CELL COUNT WITH DIFFERENTIAL
RBC Count, CSF: 37 /mm3 — ABNORMAL HIGH
RBC Count, CSF: 36 /mm3 — ABNORMAL HIGH
Tube #: 1
Tube #: 4
WBC, CSF: 0 /mm3 (ref 0–5)
WBC, CSF: 0 /mm3 (ref 0–5)

## 2022-04-09 LAB — COMPREHENSIVE METABOLIC PANEL
ALT: 24 U/L (ref 0–44)
AST: 20 U/L (ref 15–41)
Albumin: 3.4 g/dL — ABNORMAL LOW (ref 3.5–5.0)
Alkaline Phosphatase: 76 U/L (ref 38–126)
Anion gap: 9 (ref 5–15)
BUN: 6 mg/dL (ref 6–20)
CO2: 23 mmol/L (ref 22–32)
Calcium: 8.3 mg/dL — ABNORMAL LOW (ref 8.9–10.3)
Chloride: 105 mmol/L (ref 98–111)
Creatinine, Ser: 0.69 mg/dL (ref 0.44–1.00)
GFR, Estimated: >60 mL/min (ref >60)
Glucose, Bld: 100 mg/dL — ABNORMAL HIGH (ref 70–99)
Potassium: 3.2 mmol/L — ABNORMAL LOW (ref 3.5–5.1)
Sodium: 137 mmol/L (ref 135–145)
Total Bilirubin: <0.1 mg/dL — ABNORMAL LOW (ref 0.3–1.2)
Total Protein: 6.4 g/dL — ABNORMAL LOW (ref 6.5–8.1)

## 2022-04-09 LAB — DIFFERENTIAL
Abs Immature Granulocytes: 0.02 10*3/uL (ref 0.00–0.07)
Basophils Absolute: 0 10*3/uL (ref 0.0–0.1)
Basophils Relative: 0 %
Eosinophils Absolute: 0 10*3/uL (ref 0.0–0.5)
Eosinophils Relative: 1 %
Immature Granulocytes: 0 %
Lymphocytes Relative: 20 %
Lymphs Abs: 1.5 10*3/uL (ref 0.7–4.0)
Monocytes Absolute: 0.4 10*3/uL (ref 0.1–1.0)
Monocytes Relative: 6 %
Neutro Abs: 5.3 10*3/uL (ref 1.7–7.7)
Neutrophils Relative %: 73 %

## 2022-04-09 LAB — MENINGITIS/ENCEPHALITIS PANEL (CSF)

## 2022-04-09 LAB — RAPID URINE DRUG SCREEN, HOSP PERFORMED
Amphetamines: NOT DETECTED
Barbiturates: NOT DETECTED
Benzodiazepines: POSITIVE — AB
Cocaine: NOT DETECTED
Opiates: NOT DETECTED
Tetrahydrocannabinol: POSITIVE — AB

## 2022-04-09 LAB — PROTEIN AND GLUCOSE, CSF
Glucose, CSF: 59 mg/dL (ref 40–70)
Total  Protein, CSF: 20 mg/dL (ref 15–45)

## 2022-04-09 LAB — CBC
HCT: 34.5 % — ABNORMAL LOW (ref 36.0–46.0)
Hemoglobin: 10.1 g/dL — ABNORMAL LOW (ref 12.0–15.0)
MCH: 22.3 pg — ABNORMAL LOW (ref 26.0–34.0)
MCHC: 29.3 g/dL — ABNORMAL LOW (ref 30.0–36.0)
MCV: 76.2 fL — ABNORMAL LOW (ref 80.0–100.0)
Platelets: 424 10*3/uL — ABNORMAL HIGH (ref 150–400)
RBC: 4.53 MIL/uL (ref 3.87–5.11)
RDW: 16.6 % — ABNORMAL HIGH (ref 11.5–15.5)
WBC: 7.2 10*3/uL (ref 4.0–10.5)
nRBC: 0 % (ref 0.0–0.2)

## 2022-04-09 LAB — URINALYSIS, ROUTINE W REFLEX MICROSCOPIC
Bilirubin Urine: NEGATIVE
Glucose, UA: NEGATIVE mg/dL
Hgb urine dipstick: NEGATIVE
Ketones, ur: NEGATIVE mg/dL
Leukocytes,Ua: NEGATIVE
Nitrite: NEGATIVE
Protein, ur: NEGATIVE mg/dL
Specific Gravity, Urine: 1.015 (ref 1.005–1.030)
pH: 7 (ref 5.0–8.0)

## 2022-04-09 LAB — PROTIME-INR
INR: 1 (ref 0.8–1.2)
Prothrombin Time: 13.2 seconds (ref 11.4–15.2)

## 2022-04-09 LAB — ANTITHROMBIN III: AntiThromb III Func: 115 % (ref 75–120)

## 2022-04-09 LAB — I-STAT CHEM 8, ED
BUN: 5 mg/dL — ABNORMAL LOW (ref 6–20)
Calcium, Ion: 1.05 mmol/L — ABNORMAL LOW (ref 1.15–1.40)
Chloride: 103 mmol/L (ref 98–111)
Creatinine, Ser: 0.5 mg/dL (ref 0.44–1.00)
Glucose, Bld: 97 mg/dL (ref 70–99)
HCT: 33 % — ABNORMAL LOW (ref 36.0–46.0)
Hemoglobin: 11.2 g/dL — ABNORMAL LOW (ref 12.0–15.0)
Potassium: 3.2 mmol/L — ABNORMAL LOW (ref 3.5–5.1)
Sodium: 139 mmol/L (ref 135–145)
TCO2: 25 mmol/L (ref 22–32)

## 2022-04-09 LAB — APTT: aPTT: 28 seconds (ref 24–36)

## 2022-04-09 LAB — ETHANOL: Alcohol, Ethyl (B): <10 mg/dL (ref <10)

## 2022-04-09 LAB — CBG MONITORING, ED: Glucose-Capillary: 101 mg/dL — ABNORMAL HIGH (ref 70–99)

## 2022-04-09 LAB — HIV ANTIBODY (ROUTINE TESTING W REFLEX): HIV Screen 4th Generation wRfx: NONREACTIVE

## 2022-04-09 MED ORDER — IBUPROFEN 800 MG PO TABS: 800.0000 mg | ORAL_TABLET | Freq: Three times a day (TID) | ORAL | 1 refills | Status: DC | PRN

## 2022-04-09 MED ORDER — ACETAMINOPHEN 650 MG RE SUPP: 650.0000 mg | RECTAL | Status: DC | PRN

## 2022-04-09 MED ORDER — POTASSIUM CHLORIDE CRYS ER 20 MEQ PO TBCR
40.0000 meq | EXTENDED_RELEASE_TABLET | Freq: Once | ORAL | Status: AC
  Administered 2022-04-09: 40 meq via ORAL
  Filled 2022-04-09: qty 2

## 2022-04-09 MED ORDER — VALACYCLOVIR HCL 500 MG PO TABS: 500.0000 mg | ORAL_TABLET | Freq: Two times a day (BID) | ORAL | 2 refills | Status: DC

## 2022-04-09 MED ORDER — IOHEXOL 350 MG/ML SOLN
75.0000 mL | Freq: Once | INTRAVENOUS | Status: AC | PRN
  Administered 2022-04-09: 75 mL via INTRAVENOUS

## 2022-04-09 MED ORDER — MIDAZOLAM HCL 2 MG/2ML IJ SOLN
4.0000 mg | Freq: Once | INTRAMUSCULAR | Status: AC
  Administered 2022-04-09: 4 mg via INTRAVENOUS
  Filled 2022-04-09: qty 4

## 2022-04-09 MED ORDER — LIDOCAINE HCL (PF) 1 % IJ SOLN
5.0000 mL | Freq: Once | INTRAMUSCULAR | Status: AC
  Administered 2022-04-09: 4 mL

## 2022-04-09 MED ORDER — ACETAMINOPHEN 325 MG PO TABS
650.0000 mg | ORAL_TABLET | ORAL | Status: DC | PRN
  Administered 2022-04-09 – 2022-04-17 (×8): 650 mg via ORAL
  Filled 2022-04-09 (×8): qty 2

## 2022-04-09 MED ORDER — STROKE: EARLY STAGES OF RECOVERY BOOK
Freq: Once | Status: AC
  Filled 2022-04-09: qty 1

## 2022-04-09 MED ORDER — GADOBUTROL 1 MMOL/ML IV SOLN
10.0000 mL | Freq: Once | INTRAVENOUS | Status: AC | PRN
  Administered 2022-04-09: 10 mL via INTRAVENOUS

## 2022-04-09 MED ORDER — HEPARIN (PORCINE) 25000 UT/250ML-% IV SOLN
1650.0000 [IU]/h | INTRAVENOUS | Status: DC
  Administered 2022-04-10: 1000 [IU]/h via INTRAVENOUS
  Administered 2022-04-11: 1700 [IU]/h via INTRAVENOUS
  Administered 2022-04-11: 1350 [IU]/h via INTRAVENOUS
  Administered 2022-04-13: 1700 [IU]/h via INTRAVENOUS
  Administered 2022-04-13 – 2022-04-14 (×2): 1650 [IU]/h via INTRAVENOUS
  Filled 2022-04-09 (×7): qty 250

## 2022-04-09 MED ORDER — AMLODIPINE BESYLATE 5 MG PO TABS: 5.0000 mg | ORAL_TABLET | Freq: Every day | ORAL | 2 refills | Status: DC

## 2022-04-09 MED ORDER — ACETAMINOPHEN 160 MG/5ML PO SOLN: 650.0000 mg | ORAL | Status: DC | PRN

## 2022-04-09 MED ORDER — GADOBUTROL 1 MMOL/ML IV SOLN: 10.0000 mL | Freq: Once | INTRAVENOUS | Status: DC | PRN

## 2022-04-09 NOTE — ED Provider Notes (Signed)
[facility] EMERGENCY DEPARTMENT Provider Note   CSN: [id number] Arrival date & time: 04/09/22  1037  An emergency department physician performed an initial assessment on this suspected stroke patient at 1038.  History  Chief Complaint  Patient presents with   Code Stroke    Connie Herrera is a 47 y.o. female presenting to ED by EMS with concern for potential strokelike symptoms, arriving as a code stroke.  Patient has a history of hypertension.  She has had visits and evaluations of bilateral hand pain and numbness.  She reports she has been having these issues for "years".  She says she woke up okay and went to her PCPs office, when she got home, her left arm went completely dark, restaurant.  She said this has happened before.  Strength is now returning in her arm but she still has a wrist drop.  She says when she was in her 49s she was told that she "might have multiple sclerosis".  However that she then saw a neurologist and said "they did all the test and told me that it was not MS".  She has had episodes similar to this since then.  HPI     Home Medications Prior to Admission medications   Medication Sig Start Date End Date Taking? Authorizing Provider  amLODipine (NORVASC) 5 MG tablet Take 1 tablet (5 mg total) by mouth daily. 04/09/22   Camillia Herter, NP  azelastine (OPTIVAR) 0.05 % ophthalmic solution Place 1 drop into both eyes 2 (two) times daily as needed. 02/11/22   Brunetta Jeans, PA-C  baclofen (LIORESAL) 10 MG tablet Take 1 tablet (10 mg total) by mouth 3 (three) times daily. Patient not taking: Reported on 04/09/2022 02/25/22   Pattricia Boss, MD  butalbital-acetaminophen-caffeine (FIORICET) [phone] MG tablet Take 1 tablet by mouth every 8 (eight) hours as needed for headache. Patient not taking: Reported on 04/09/2022 10/22/21 10/22/22  Raspet, Derry Skill, PA-C  ferrous sulfate 325 (65 FE) MG EC tablet Take 1 tablet (325 mg total) by mouth 2 (two)  times daily. 01/08/22   Benay Pike, MD  ibuprofen (ADVIL) 800 MG tablet Take 1 tablet (800 mg total) by mouth every 8 (eight) hours as needed. 04/09/22   Camillia Herter, NP  metroNIDAZOLE (FLAGYL) 500 MG tablet Take 1 tablet (500 mg total) by mouth 2 (two) times daily. Patient not taking: Reported on 04/09/2022 01/03/22   Hans Eden, NP  prochlorperazine (COMPAZINE) 10 MG tablet Take 1 tablet (10 mg total) by mouth every 8 (eight) hours as needed (nausea or headache). Patient not taking: Reported on 04/09/2022 10/23/21   Maudie Flakes, MD  trimethoprim-polymyxin b Mayra Neer) ophthalmic solution Place 1 drop into the right eye every 4 (four) hours. X 5 days Patient not taking: Reported on 04/09/2022 02/27/22   Mar Daring, PA-C  valACYclovir (VALTREX) 500 MG tablet Take 1 tablet (500 mg total) by mouth 2 (two) times daily. For herpes outbreak 04/09/22   Camillia Herter, NP  hydrochlorothiazide (HYDRODIURIL) 25 MG tablet Take 1 tablet (25 mg total) by mouth daily. Must have office visit for refills 06/20/20 08/13/20  Brunetta Jeans, PA-C      Allergies    Patient has no known allergies.    Review of Systems   Review of Systems  Physical Exam Updated Vital Signs BP (!) 140/98   Pulse 85   Temp (!) 97 F (36.1 C) (Temporal)   Resp (!) 22  SpO2 100%  Physical Exam Constitutional:      General: She is not in acute distress. HENT:     Head: Normocephalic and atraumatic.  Eyes:     Conjunctiva/sclera: Conjunctivae normal.     Pupils: Pupils are equal, round, and reactive to light.  Cardiovascular:     Rate and Rhythm: Normal rate and regular rhythm.  Pulmonary:     Effort: Pulmonary effort is normal. No respiratory distress.  Abdominal:     General: There is no distension.     Tenderness: There is no abdominal tenderness.  Skin:    General: Skin is warm and dry.  Neurological:     General: No focal deficit present.     Mental Status: She is alert. Mental status is at  baseline.     Comments: Left wrist drop, paresthesias and the left distal and proximal arm, normal strength to biceps testing bilateral arms Normal strength bilaterally in the lower extremities.  Psychiatric:        Mood and Affect: Mood normal.        Behavior: Behavior normal.     ED Results / Procedures / Treatments   Labs (all labs ordered are listed, but only abnormal results are displayed) Labs Reviewed  CBC - Abnormal; Notable for the following components:      Result Value   Hemoglobin 10.1 (*)    HCT 34.5 (*)    MCV 76.2 (*)    MCH 22.3 (*)    MCHC 29.3 (*)    RDW 16.6 (*)    Platelets 424 (*)    All other components within normal limits  COMPREHENSIVE METABOLIC PANEL - Abnormal; Notable for the following components:   Potassium 3.2 (*)    Glucose, Bld 100 (*)    Calcium 8.3 (*)    Total Protein 6.4 (*)    Albumin 3.4 (*)    Total Bilirubin <0.1 (*)    All other components within normal limits  CBG MONITORING, ED - Abnormal; Notable for the following components:   Glucose-Capillary 101 (*)    All other components within normal limits  I-STAT CHEM 8, ED - Abnormal; Notable for the following components:   Potassium 3.2 (*)    BUN 5 (*)    Calcium, Ion 1.05 (*)    Hemoglobin 11.2 (*)    HCT 33.0 (*)    All other components within normal limits  CSF CULTURE W GRAM STAIN  CULTURE, FUNGUS WITHOUT SMEAR  ETHANOL  PROTIME-INR  APTT  DIFFERENTIAL  RAPID URINE DRUG SCREEN, HOSP PERFORMED  URINALYSIS, ROUTINE W REFLEX MICROSCOPIC  CSF CELL COUNT WITH DIFFERENTIAL  CSF CELL COUNT WITH DIFFERENTIAL  PROTEIN AND GLUCOSE, CSF  HIV ANTIBODY (ROUTINE TESTING W REFLEX)  VDRL, CSF  OLIGOCLONAL BANDS, CSF + SERM  DRAW EXTRA CLOT TUBE  CRYPTOCOCCAL ANTIGEN, CSF  MENINGITIS/ENCEPHALITIS PANEL (CSF)  I-STAT BETA HCG BLOOD, ED ([facility], [facility], [facility] ONLY)    EKG EKG Interpretation  Date/Time:  Thursday April 09 2022 13:12:30 EST Ventricular Rate:  89 PR Interval:  198 QRS  Duration: 90 QT Interval:  376 QTC Calculation: 458 R Axis:   -1 Text Interpretation: Sinus rhythm Confirmed by Octaviano Glow [phone]) on 04/09/2022 1:47:38 PM  Radiology MR THORACIC SPINE W WO CONTRAST  Result Date: 04/09/2022 CLINICAL DATA:  Bilateral hand pain and numbness. Rule out spontaneous CSF leak/intracranial hypotension EXAM: MRI THORACIC WITHOUT AND WITH CONTRAST TECHNIQUE: Multiplanar and multiecho pulse sequences of the thoracic spine were obtained  without and with intravenous contrast. CONTRAST:  35m GADAVIST GADOBUTROL 1 MMOL/ML IV SOLN COMPARISON:  None Available. FINDINGS: Alignment:  Normal alignment.  Mild scoliosis Vertebrae: Negative for fracture or mass. No evidence of spinal infection in the thoracic spine Cord: Normal signal and morphology. Normal enhancement of the cord. Paraspinal and other soft tissues: Negative for paraspinous mass or fluid collection. Thyroid nodules are described in the cervical spine report. Disc levels: Small central disc protrusion T6-7 and T7-8 without spinal stenosis. Negative for thoracic spinal stenosis. IMPRESSION: Mild thoracic degenerative change. No evidence of spinal infection or spinal stenosis in the thoracic spine. Electronically Signed   By: CFranchot GalloM.D.   On: 04/09/2022 13:46   MR CERVICAL SPINE W WO CONTRAST  Result Date: 04/09/2022 CLINICAL DATA:  Bilateral hand pain and numbness. Rule out intracranial hypotension/CSF leak. EXAM: MRI CERVICAL SPINE WITHOUT AND WITH CONTRAST TECHNIQUE: Multiplanar and multiecho pulse sequences of the cervical spine, to include the craniocervical junction and cervicothoracic junction, were obtained without and with intravenous contrast. CONTRAST:  143mGADAVIST GADOBUTROL 1 MMOL/ML IV SOLN COMPARISON:  MRI cervical spine 08/13/2020 FINDINGS: Alignment: Normal Vertebrae: Negative for fracture or mass. No evidence of spinal infection. Cord: Normal signal and morphology.  Normal enhancement of the cord.  Posterior Fossa, vertebral arteries, paraspinal tissues: Right thyroid nodule approximately 2.5 cm. Left thyroid nodule 1.6 cm. No enlarged lymph nodes in the neck. Disc levels: C2-3: Negative C3-4: Mild disc degeneration without significant stenosis C4-5: Mild disc degeneration and spurring. Bulging of the disc without significant stenosis C5-6: Mild disc degeneration.  Negative for stenosis C6-7: Mild disc degeneration and disc bulging without stenosis C7-T1: Negative Image quality degraded by motion. IMPRESSION: 1. Mild degenerative change in the cervical spine. No significant stenosis. No evidence of spinal infection. 2. 2.5 cm right thyroid nodule. Left thyroid nodule 1.6 cm. (ref: J Am Coll Radiol. 2015 Feb;12(2): 143-50). Recommend thyroid ultrasound for further evaluation. Electronically Signed   By: ChFranchot Gallo.D.   On: 04/09/2022 13:42   MR BRAIN W WO CONTRAST  Result Date: 04/09/2022 CLINICAL DATA:  Bilateral hand pain and numbness. Rule out demyelinating disease or stroke. EXAM: MRI HEAD WITHOUT AND WITH CONTRAST TECHNIQUE: Multiplanar, multiecho pulse sequences of the brain and surrounding structures were obtained without and with intravenous contrast. CONTRAST:  1062mADAVIST GADOBUTROL 1 MMOL/ML IV SOLN COMPARISON:  CT head 10/22/2021.  MRI head 07/11/2020 FINDINGS: Brain: Multiple small areas of diffusion hyperintense signal in the occipital parietal lobes bilaterally. Some of these lesions show mild enhancement in the parietal lobe bilaterally. Some of the lesions do not show abnormal enhancement. In addition, there are scattered white matter hyperintensities bilaterally which appear mild. Brainstem normal. 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 Enhancement is somewhat atypical however is stable no other areas of hemorrhage Ventricle size normal.  Left a meningeal enhancement normal. Pituitary normal Vascular: Normal arterial flow voids Skull and upper cervical spine: No focal skeletal lesion. Sinuses/Orbits: Paranasal sinuses clear.  Negative orbit Other: None IMPRESSION: 1. Multiple small areas of diffusion hyperintense signal in the occipital parietal lobes bilaterally. Some of these lesions do mild enhancement. Appearance is nonspecific but could be seen with subacute infarct. Demyelinating disease a consideration but not typical. Also consider inflammatory process such as sarcoid, vasculitis, lupus and PRES. Recommend lumbar puncture for further evaluation. 2. Chronic hemorrhage in  the left lateral basal ganglia unchanged from the prior MRI. 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 Enhancement is somewhat atypical however is stable. No other areas of hemorrhage. 3. Mild white matter changes in the cerebral hemispheres bilaterally. This may be due to chronic microvascular ischemia. Demyelinating disease not excluded. Correlate with risk factors for small vessel disease. Electronically Signed   By: Franchot Gallo M.D.   On: 04/09/2022 13:38    Procedures .Lumbar Puncture  Date/Time: 04/09/2022 3:44 PM  Performed by: Wyvonnia Dusky, MD Authorized by: Wyvonnia Dusky, MD   Consent:    Consent obtained:  Verbal   Consent given by:  Patient   Risks, benefits, and alternatives were discussed: yes     Risks discussed:  Bleeding, infection, nerve damage, repeat procedure, pain and headache   Alternatives discussed:  No treatment and observation Universal protocol:    Procedure explained and questions answered to patient or proxy's satisfaction: yes     Relevant documents present and verified: yes     Test results available: yes     Imaging studies available: yes     Required blood products, implants, devices, and special equipment available: yes      Immediately prior to procedure a time out was called: yes     Site/side marked: yes     Patient identity confirmed:  Arm band Pre-procedure details:    Procedure purpose:  Diagnostic   Preparation: Patient was prepped and draped in usual sterile fashion   Anesthesia:    Anesthesia method:  None Procedure details:    Lumbar space:  L4-L5 interspace   Patient position:  L lateral decubitus   Needle gauge:  20   Needle length (in):  2.5   Ultrasound guidance: no     Number of attempts:  1 Post-procedure details:    Puncture site:  Adhesive bandage applied and direct pressure applied   Procedure completion:  Tolerated well, no immediate complications Comments:     Unsuccessful attempt - could not pass needle into spinal canal     Medications Ordered in ED Medications  gadobutrol (GADAVIST) 1 MMOL/ML injection 10 mL (has no administration in time range)   stroke: early stages of recovery book (has no administration in time range)  acetaminophen (TYLENOL) tablet 650 mg (has no administration in time range)    Or  acetaminophen (TYLENOL) 160 MG/5ML solution 650 mg (has no administration in time range)    Or  acetaminophen (TYLENOL) suppository 650 mg (has no administration in time range)  potassium chloride SA (KLOR-CON M) CR tablet 40 mEq (has no administration in time range)  gadobutrol (GADAVIST) 1 MMOL/ML injection 10 mL (10 mLs Intravenous Contrast Given 04/09/22 1247)  midazolam (VERSED) injection 4 mg (4 mg Intravenous Given 04/09/22 1451)    ED Course/ Medical Decision Making/ A&P Clinical Course as of 04/09/22 1601  Thu Apr 09, 2022  1543 Unsuccessful LP attempt, difficulty passing needle into spinal canal.   [MT]  1547 I spoke to radiologist who will assist with fluoroscopic LP [MT]    Clinical Course User Index [MT] Meliana Canner, Carola Rhine, MD                           Medical Decision Making Amount and/or Complexity of Data Reviewed Labs:  ordered.  Risk Prescription drug management. Decision regarding hospitalization.   This  patient presents to the ED with concern for left arm paresthesia and weakness. This involves an extensive number of treatment options, and is a complaint that carries with it a high risk of complications and morbidity.  The differential diagnosis includes CVA versus complex migraine versus multiple sclerosis or other CNS lesion versus other  Additional history obtained from EMS  External records from outside source obtained and reviewed including MRI of the thoracic spine performed May 2022 which showed a possible lesion versus artifact of the thoracic spine.  I ordered and personally interpreted labs.  The pertinent results include: Mild hypokalemia potassium of 3.2.  Chronic anemia with hemoglobin of 10.1.  No leukocytosis.  I ordered imaging studies including MRI of the brain, cervical and thoracic spine I independently visualized and interpreted imaging which showed multiple small demyelinating type lesions of the brain, possible chronic hemorrhage I agree with the radiologist interpretation  The patient was maintained on a cardiac monitor.  I personally viewed and interpreted the cardiac monitored which showed an underlying rhythm of: Sinus rhythm  Per my interpretation the patient's ECG shows normal sinus rhythm with no acute ischemic findings   Test Considered: I will lower suspicion for acute meningitis at this time or subarachnoid hemorrhage and I did not feel that the patient needed an emergent lumbar puncture performed in the emergency department.  No indication for thrombolytic therapy as this is not consistent with an embolic lesion of the brain.  I requested consultation with the neurology, and discussed lab and imaging findings as well as pertinent plan - they recommend: Admission for further workup; lumbar puncture.  Lumbar puncture was attempted unsuccessfully at the bedside after  Versed was given as an anxiolytic.  I was unable to pass the needle into the spinal canal, which may be due to degenerative spinal disease.  I spoke with the on-call fluoroscopic radiologist who states they will be able to assist with lumbar puncture.  The order was placed for fluoroscopic LP.  Appropriate CSF studies have been ordered by the neurologist already.  After the interventions noted above, I reevaluated the patient and found that they have: improved -the patient feels that her left arm weakness overall is improving although she continues to have a left wrist drop.  Dispostion:  After consideration of the diagnostic results and the patients response to treatment, I feel that the patent would benefit from medical admission.         Final Clinical Impression(s) / ED Diagnoses Final diagnoses:  Left arm weakness    Rx / DC Orders ED Discharge Orders     None         Wyvonnia Dusky, MD 04/09/22 1601

## 2022-04-09 NOTE — Procedures (Signed)
PROCEDURE SUMMARY:  Successful image-guided lumbar puncture at level of L3.  Opening pressure not measured. Yielded 9 mL of clear CSF.  No immediate complications.  EBL = trace. Patient tolerated well.   Specimen was sent for labs.  Please see imaging section of Epic for full dictation.   Mont[place] PA-C 04/09/2022 5:09 PM

## 2022-04-09 NOTE — Consult Note (Addendum)
Neurology Consultation  Reason for Consult: Code stroke, left-sided weakness Referring Physician: Octaviano Glow  CC:   History is obtained from: Patient, chart review  HPI: Connie Herrera is a 47 y.o. 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  Today her left upper extremity weakness is not resolving and therefore EMS activated code stroke for evaluation  She has had extensive prior neurological evaluation including lumbar puncture most recently 05/05/2021 which was normal, and last saw Dr. Leta Baptist 09/30/2020  LKW: 1 week ago tpa given?: no, outside of window, left basal ganglia cavernoma Premorbid modified Rankin scale (mRS):  0-Completely asymptomatic and back to baseline post-stroke  ROS:    Past Medical History:  Diagnosis Date   Brain lesion    COVID-19 04/2020   Diplopia    Hypertension    Iron deficiency anemia    Past Surgical History:  Procedure Laterality Date   TUBAL LIGATION     Current Outpatient Medications  Medication Instructions   amLODipine (NORVASC) 5 mg, Oral, Daily   azelastine (OPTIVAR) 0.05 % ophthalmic solution 1 drop, Both Eyes, 2 times daily PRN   baclofen (LIORESAL) 10 mg, Oral, 3 times daily   butalbital-acetaminophen-caffeine (FIORICET) 50-325-40 MG tablet 1 tablet, Oral, Every 8 hours PRN   ferrous sulfate 325 mg, Oral, 2 times daily   ibuprofen (ADVIL) 800 mg, Oral, Every 8 hours PRN   metroNIDAZOLE (FLAGYL) 500 mg, Oral, 2 times daily   prochlorperazine (COMPAZINE) 10 mg, Oral, Every 8 hours PRN   trimethoprim-polymyxin b (POLYTRIM) ophthalmic solution 1 drop, Right Eye, Every 4 hours, X 5 days   valACYclovir (VALTREX) 500 mg, Oral, 2 times daily, For herpes outbreak   Family History  Problem Relation Age of Onset   Healthy Mother    Cirrhosis Father    Cancer  Maternal Grandmother 21       breast   Breast cancer Maternal Grandmother     Social History:   reports that she quit smoking about 4 years ago. Her smoking use included cigarettes. She has a 2.00 pack-year smoking history. She has never used smokeless tobacco. She reports that she does not currently use drugs after having used the following drugs: Marijuana. She reports that she does not drink alcohol.  Medications No current facility-administered medications for this encounter.  Current Outpatient Medications:    amLODipine (NORVASC) 5 MG tablet, Take 1 tablet (5 mg total) by mouth daily., Disp: 30 tablet, Rfl: 2   azelastine (OPTIVAR) 0.05 % ophthalmic solution, Place 1 drop into both eyes 2 (two) times daily as needed., Disp: 6 mL, Rfl: 1   baclofen (LIORESAL) 10 MG tablet, Take 1 tablet (10 mg total) by mouth 3 (three) times daily. (Patient not taking: Reported on 04/09/2022), Disp: 30 each, Rfl: 0   butalbital-acetaminophen-caffeine (FIORICET) 50-325-40 MG tablet, Take 1 tablet by mouth every 8 (eight) hours as needed for headache. (Patient not taking: Reported on 04/09/2022), Disp: 2 tablet, Rfl: 0   ferrous sulfate 325 (65 FE) MG EC tablet, Take 1 tablet (325 mg total) by mouth 2 (two) times daily., Disp: 60 tablet, Rfl: 3   ibuprofen (ADVIL) 800 MG tablet, Take 1 tablet (800 mg total) by mouth every 8 (eight) hours as needed., Disp: 30 tablet, Rfl: 1   metroNIDAZOLE (FLAGYL) 500 MG tablet, Take 1 tablet (500 mg total) by mouth 2 (two)  times daily. (Patient not taking: Reported on 04/09/2022), Disp: 14 tablet, Rfl: 0   prochlorperazine (COMPAZINE) 10 MG tablet, Take 1 tablet (10 mg total) by mouth every 8 (eight) hours as needed (nausea or headache). (Patient not taking: Reported on 04/09/2022), Disp: 20 tablet, Rfl: 0   trimethoprim-polymyxin b (POLYTRIM) ophthalmic solution, Place 1 drop into the right eye every 4 (four) hours. X 5 days (Patient not taking: Reported on 04/09/2022), Disp: 10 mL,  Rfl: 0   valACYclovir (VALTREX) 500 MG tablet, Take 1 tablet (500 mg total) by mouth 2 (two) times daily. For herpes outbreak, Disp: 6 tablet, Rfl: 2   Exam: Current vital signs: There were no vitals taken for this visit. Vital signs in last 24 hours: Pulse Rate:  [87] 87 (01/04 0815) BP: (153)/(103) 153/103 (01/04 0815) SpO2:  [96 %] 96 % (01/04 0815) Weight:  [106.6 kg] 106.6 kg (01/04 0815)  GENERAL: Awake, alert, anxious and tearful HEENT: - Normocephalic and atraumatic LUNGS -tachypneic but no audible wheezing CV - S1S2 RRR, no m/r/g, equal pulses bilaterally. ABDOMEN - Soft, nontender, nondistended Ext: warm, well perfused  NEURO:  Mental Status: AA&Ox3  Language: speech is clear.  Naming, repetition, fluency, and comprehension intact. Cranial Nerves: PERRL. EOMI, visual fields full, no facial asymmetry, facial sensation intact, hearing intact,  Motor/gait: Inconsistent weakness of the left upper extremity, uses it more and spontaneously than on formal testing Slight drift of the left lower extremity on formal testing, but on casual observation of gait she is using both legs equally with steady gait Sensation- Intact to light touch bilaterally, no extinction to double simultaneous stimuli Coordination: FTN intact bilaterally within limits of functional weakness, no ataxia in BLE.   NIHSS components Score: Comment  1a Level of Conscious 0_0  1_1  2_2  3_3         1b LOC Questions 0_4  1_5  2_6           1c LOC Commands 0_7  1_8  2_9           2 Best Gaze 0_10  1_11  2_12           3 Visual 0_13  1_14  2_15  3_16         4 Facial Palsy 0_17  1_18  2_19  3_20         5a Motor Arm - left 0_21  1_22  2_23  3_24  4_25  UN_26     5b Motor Arm - Right 0_27  1_28  2_29  3_30  4_31  UN_32     6a Motor Leg - Left 0_33  1_34  2_35  3_36  4_37  UN_38     6b Motor Leg - Right 0_39  1_40  2_41  3_42  4_43  UN_44     7 Limb Ataxia 0_45  1_46  2_47  3_48  UN_49       8 Sensory 0_50  1_51  2_52  UN_53         9 Best Language 0_54  1_55  2_56  3_57         10 Dysarthria 0_58  1_59  2_60  UN_61          11 Extinct. and Inattention 0_62  1_63  2_64           TOTAL: 2        Labs I have reviewed labs in epic and the results pertinent to this consultation are:   Basic Metabolic Panel: Recent Labs  Lab 04/09/22 1315 04/09/22 1322  NA 137 139  K 3.2* 3.2*  CL 105 103  CO2 23  --   GLUCOSE 100* 97  BUN 6 5*  CREATININE 0.69 0.50  CALCIUM 8.3*  --     CBC: Recent Labs  Lab 04/09/22 1315 04/09/22 1322  WBC  7.2  --   NEUTROABS 5.3  --   HGB 10.1* 11.2*  HCT 34.5* 33.0*  MCV 76.2*  --   PLT 424*  --     Coagulation Studies: Recent Labs    04/09/22 1315  LABPROT 13.2  INR 1.0     September 30, 2020 labs  CK intermittently elevated to approximately 300 (10/03/2020 306)   Aldolase normal   Vitamin B12 397   SPEP with IFE -negative   ANA w/Reflex negative   SSA, SSB negative   ESR normal   CRP normal   Angiotensin converting enzyme -64 (normal)    Imaging I have reviewed the images obtained:  MRI examination of the brain  1. Multiple small areas of diffusion hyperintense signal in the occipital parietal lobes bilaterally. Some of these lesions do mild enhancement. Appearance is nonspecific but could be seen with subacute infarct. Demyelinating disease a consideration but not typical. Also consider inflammatory process such as sarcoid, vasculitis, lupus and PRES. Recommend lumbar puncture for further evaluation. 2. 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 Enhancement is somewhat atypical however is stable. No other areas of hemorrhage. 3. Mild white matter changes in the cerebral hemispheres bilaterally. This may be due to chronic microvascular ischemia. Demyelinating disease not excluded. Correlate with risk factors for small vessel disease.    MRI C-spine 1. Mild degenerative  change in the cervical spine. No significant stenosis. No evidence of spinal infection. 2. 2.5 cm right thyroid nodule. Left thyroid nodule 1.6 cm. (ref: J Am Coll Radiol. 2015 Feb;12(2): 143-50). Recommend thyroid ultrasound for further evaluation.  MRI T-spine Mild thoracic degenerative change. No evidence of spinal infection or spinal stenosis in the thoracic spine.  Assessment: Multiple areas of diffusion restriction in this patient who has had a longstanding history of transient neurological symptoms concerning for demyelinating disease such as multiple sclerosis.  However she does also have some stroke risk factors such as hypertension, obesity.  Initial recommendations: -MRI brain to rule out any significant change in cavernoma or acute intracranial process  Additional recommendation:  -Appreciate ED assistance with LP to further assess MRI brain findings -Stroke workup simultaneously (CTA head and neck, ECHO with bubble, TCD, A1c, Lipids, hypercoag panel, RPR, HIV, TSH, tele, PT/OT/SLP), but hold off on ASA for now given diagnostic uncertainty -Neurology will follow in consultation   Lesleigh Noe MD-PhD [facility] [phone] Available 7 AM to 7 PM, outside these hours please contact Neurologist on call listed on AMION

## 2022-04-09 NOTE — Code Documentation (Signed)
Patient arrived to [facility] as pre-activated code stroke via [facility]. She has had symptoms of left hand weakness and pain off and on for over a week. Today her symptoms lingered longer and she has some drift and weak grip on the left. NIHSS 1. Outside window for treatment. Code stroke canceled. MD wanting MRI. Hand off with Chloe, RN.

## 2022-04-09 NOTE — H&P (Addendum)
[facility] History and Physical  Connie Herrera NFA:[id number] DOB: [birth year] DOA: 04/09/2022   PCP: Camillia Herter, NP  Specialists: Previously seen by Dr. Leta Baptist with neurology  Chief Complaint: Left arm weakness  HPI: Connie Herrera is a 47 y.o. female with a past medical history of essential hypertension, previous history of transient numbness and weakness which have resolved spontaneously.  She has been seen by neurology previously.  Underwent lumbar puncture in January 2023.  She was in her usual state of health till this morning when she went to see her primary care provider.  This was for a routine visit.  She came back to her house and then noticed that her left arm was weak.  She had similar symptoms about a week ago but thought that weakness resolved spontaneously.  Denies any speech impairment.  No leg weakness.  No recent falls or injuries.  No fever or chills.  In the emergency department she underwent MRI brain which showed multiple small areas of diffusion hyperintense signal in the occipital parietal lobes bilaterally.  Chronic changes were also noted.  Etiology for these findings were not clear.  Neurology was consulted.  Lumbar puncture was recommended which is to be attempted by the ED physician.  Patient to be hospitalized for further management.   Home Medications: Prior to Admission medications   Medication Sig Start Date End Date Taking? Authorizing Provider  amLODipine (NORVASC) 5 MG tablet Take 1 tablet (5 mg total) by mouth daily. 04/09/22   Camillia Herter, NP  azelastine (OPTIVAR) 0.05 % ophthalmic solution Place 1 drop into both eyes 2 (two) times daily as needed. 02/11/22   Brunetta Jeans, PA-C  baclofen (LIORESAL) 10 MG tablet Take 1 tablet (10 mg total) by mouth 3 (three) times daily. Patient not taking: Reported on 04/09/2022 02/25/22   Pattricia Boss, MD  butalbital-acetaminophen-caffeine (FIORICET) [phone] MG tablet Take 1 tablet by  mouth every 8 (eight) hours as needed for headache. Patient not taking: Reported on 04/09/2022 10/22/21 10/22/22  Raspet, Derry Skill, PA-C  ferrous sulfate 325 (65 FE) MG EC tablet Take 1 tablet (325 mg total) by mouth 2 (two) times daily. 01/08/22   Benay Pike, MD  ibuprofen (ADVIL) 800 MG tablet Take 1 tablet (800 mg total) by mouth every 8 (eight) hours as needed. 04/09/22   Camillia Herter, NP  metroNIDAZOLE (FLAGYL) 500 MG tablet Take 1 tablet (500 mg total) by mouth 2 (two) times daily. Patient not taking: Reported on 04/09/2022 01/03/22   Hans Eden, NP  prochlorperazine (COMPAZINE) 10 MG tablet Take 1 tablet (10 mg total) by mouth every 8 (eight) hours as needed (nausea or headache). Patient not taking: Reported on 04/09/2022 10/23/21   Maudie Flakes, MD  trimethoprim-polymyxin b Mayra Neer) ophthalmic solution Place 1 drop into the right eye every 4 (four) hours. X 5 days Patient not taking: Reported on 04/09/2022 02/27/22   Mar Daring, PA-C  valACYclovir (VALTREX) 500 MG tablet Take 1 tablet (500 mg total) by mouth 2 (two) times daily. For herpes outbreak 04/09/22   Camillia Herter, NP  hydrochlorothiazide (HYDRODIURIL) 25 MG tablet Take 1 tablet (25 mg total) by mouth daily. Must have office visit for refills 06/20/20 08/13/20  Brunetta Jeans, PA-C    Allergies: No Known Allergies  Past Medical History: Past Medical History:  Diagnosis Date   Brain lesion    COVID-19 04/2020   Diplopia    Hypertension    Iron  deficiency anemia     Past Surgical History:  Procedure Laterality Date   TUBAL LIGATION      Social History: Lives by herself.  Former smoker.  No alcohol use.  Uses marijuana occasionally.  Usually independent with daily activities.  Family History:  Family History  Problem Relation Age of Onset   Healthy Mother    Cirrhosis Father    Cancer Maternal Grandmother 53       breast   Breast cancer Maternal Grandmother      Review of Systems - History  obtained from the patient General ROS: negative Psychological ROS: negative Ophthalmic ROS: negative ENT ROS: negative Allergy and Immunology ROS: negative Hematological and Lymphatic ROS: negative Endocrine ROS: negative Respiratory ROS: no cough, shortness of breath, or wheezing Cardiovascular ROS: no chest pain or dyspnea on exertion Gastrointestinal ROS: no abdominal pain, change in bowel habits, or black or bloody stools Genito-Urinary ROS: no dysuria, trouble voiding, or hematuria Musculoskeletal ROS: negative Neurological ROS: As in HPI Dermatological ROS: negative  Physical Examination  Vitals:   04/09/22 1432 04/09/22 1503 04/09/22 1506 04/09/22 1515  BP:  130/83  (!) 140/98  Pulse: 92  90 85  Resp: 12 (!) 25 15 (!) 22  Temp:      TempSrc:      SpO2: 100%  100% 100%    BP (!) 140/98   Pulse 85   Temp (!) 97 F (36.1 C) (Temporal)   Resp (!) 22   SpO2 100%   General appearance: alert, cooperative, appears stated age, and no distress Head: Normocephalic, without obvious abnormality, atraumatic Eyes: conjunctivae/corneas clear. PERRL, EOM's intact.  Neck: no adenopathy, no carotid bruit, no JVD, supple, symmetrical, trachea midline, and thyroid not enlarged, symmetric, no tenderness/mass/nodules Resp: clear to auscultation bilaterally Cardio: regular rate and rhythm, S1, S2 normal, no murmur, click, rub or gallop GI: soft, non-tender; bowel sounds normal; no masses,  no organomegaly Extremities: extremities normal, atraumatic, no cyanosis or edema Pulses: 2+ and symmetric Skin: Skin color, texture, turgor normal. No rashes or lesions Lymph nodes: Cervical, supraclavicular, and axillary nodes normal. Neurologic: Alert and oriented x 3.  Cranial nerves II to XII intact.  Motor strength equal bilateral lower extremity.  3-4 out of 5 strength in left upper extremity.   Labs on Admission: I have personally reviewed following labs and imaging studies  CBC: Recent  Labs  Lab 04/09/22 1315 04/09/22 1322  WBC 7.2  --   NEUTROABS 5.3  --   HGB 10.1* 11.2*  HCT 34.5* 33.0*  MCV 76.2*  --   PLT 424*  --    Basic Metabolic Panel: Recent Labs  Lab 04/09/22 1315 04/09/22 1322  NA 137 139  K 3.2* 3.2*  CL 105 103  CO2 23  --   GLUCOSE 100* 97  BUN 6 5*  CREATININE 0.69 0.50  CALCIUM 8.3*  --    GFR: Estimated Creatinine Clearance: 108.5 mL/min (by C-G formula based on SCr of 0.5 mg/dL). Liver Function Tests: Recent Labs  Lab 04/09/22 1315  AST 20  ALT 24  ALKPHOS 76  BILITOT <0.1*  PROT 6.4*  ALBUMIN 3.4*    Coagulation Profile: Recent Labs  Lab 04/09/22 1315  INR 1.0    CBG: Recent Labs  Lab 04/09/22 1038  GLUCAP 101*     Radiological Exams on Admission: MR THORACIC SPINE W WO CONTRAST  Result Date: 04/09/2022 CLINICAL DATA:  Bilateral hand pain and numbness. Rule out spontaneous CSF leak/intracranial  hypotension EXAM: MRI THORACIC WITHOUT AND WITH CONTRAST TECHNIQUE: Multiplanar and multiecho pulse sequences of the thoracic spine were obtained without and with intravenous contrast. CONTRAST:  51m GADAVIST GADOBUTROL 1 MMOL/ML IV SOLN COMPARISON:  None Available. FINDINGS: Alignment:  Normal alignment.  Mild scoliosis Vertebrae: Negative for fracture or mass. No evidence of spinal infection in the thoracic spine Cord: Normal signal and morphology. Normal enhancement of the cord. Paraspinal and other soft tissues: Negative for paraspinous mass or fluid collection. Thyroid nodules are described in the cervical spine report. Disc levels: Small central disc protrusion T6-7 and T7-8 without spinal stenosis. Negative for thoracic spinal stenosis. IMPRESSION: Mild thoracic degenerative change. No evidence of spinal infection or spinal stenosis in the thoracic spine. Electronically Signed   By: CFranchot GalloM.D.   On: 04/09/2022 13:46   MR CERVICAL SPINE W WO CONTRAST  Result Date: 04/09/2022 CLINICAL DATA:  Bilateral hand pain and  numbness. Rule out intracranial hypotension/CSF leak. EXAM: MRI CERVICAL SPINE WITHOUT AND WITH CONTRAST TECHNIQUE: Multiplanar and multiecho pulse sequences of the cervical spine, to include the craniocervical junction and cervicothoracic junction, were obtained without and with intravenous contrast. CONTRAST:  154mGADAVIST GADOBUTROL 1 MMOL/ML IV SOLN COMPARISON:  MRI cervical spine 08/13/2020 FINDINGS: Alignment: Normal Vertebrae: Negative for fracture or mass. No evidence of spinal infection. Cord: Normal signal and morphology.  Normal enhancement of the cord. Posterior Fossa, vertebral arteries, paraspinal tissues: Right thyroid nodule approximately 2.5 cm. Left thyroid nodule 1.6 cm. No enlarged lymph nodes in the neck. Disc levels: C2-3: Negative C3-4: Mild disc degeneration without significant stenosis C4-5: Mild disc degeneration and spurring. Bulging of the disc without significant stenosis C5-6: Mild disc degeneration.  Negative for stenosis C6-7: Mild disc degeneration and disc bulging without stenosis C7-T1: Negative Image quality degraded by motion. IMPRESSION: 1. Mild degenerative change in the cervical spine. No significant stenosis. No evidence of spinal infection. 2. 2.5 cm right thyroid nodule. Left thyroid nodule 1.6 cm. (ref: J Am Coll Radiol. 2015 Feb;12(2): 143-50). Recommend thyroid ultrasound for further evaluation. Electronically Signed   By: ChFranchot Gallo.D.   On: 04/09/2022 13:42   MR BRAIN W WO CONTRAST  Result Date: 04/09/2022 CLINICAL DATA:  Bilateral hand pain and numbness. Rule out demyelinating disease or stroke. EXAM: MRI HEAD WITHOUT AND WITH CONTRAST TECHNIQUE: Multiplanar, multiecho pulse sequences of the brain and surrounding structures were obtained without and with intravenous contrast. CONTRAST:  1078mADAVIST GADOBUTROL 1 MMOL/ML IV SOLN COMPARISON:  CT head 10/22/2021.  MRI head 07/11/2020 FINDINGS: Brain: Multiple small areas of diffusion hyperintense signal in the  occipital parietal lobes bilaterally. Some of these lesions show mild enhancement in the parietal lobe bilaterally. Some of the lesions do not show abnormal enhancement. In addition, there are scattered white matter hyperintensities bilaterally which appear mild. Brainstem normal. 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 Enhancement is somewhat atypical however is stable no other areas of hemorrhage Ventricle size normal. Left a meningeal enhancement normal. Pituitary normal Vascular: Normal arterial flow voids Skull and upper cervical spine: No focal skeletal lesion. Sinuses/Orbits: Paranasal sinuses clear.  Negative orbit Other: None IMPRESSION: 1. Multiple small areas of diffusion hyperintense signal in the occipital parietal lobes bilaterally. Some of these lesions do mild enhancement. Appearance is nonspecific but could be seen with subacute infarct. Demyelinating disease a consideration but not typical. Also  consider inflammatory process such as sarcoid, vasculitis, lupus and PRES. Recommend lumbar puncture for further evaluation. 2. 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 Enhancement is somewhat atypical however is stable. No other areas of hemorrhage. 3. Mild white matter changes in the cerebral hemispheres bilaterally. This may be due to chronic microvascular ischemia. Demyelinating disease not excluded. Correlate with risk factors for small vessel disease. Electronically Signed   By: Franchot Gallo M.D.   On: 04/09/2022 13:38    My interpretation of Electrocardiogram: Sinus rhythm in the 80s.  Normal axis.  Intervals normal.  No concerning ST or T wave changes.   Problem  List  Principal Problem:   Left arm weakness Active Problems:   Hypertension   Anemia due to blood loss, chronic   Iron deficiency anemia   Hypokalemia   Assessment: This is a 47 year old female with a past medical history of hypertension presented with left arm weakness.  She has previous history of transient neurological symptoms of numbness and weakness which resolved spontaneously.  There was concern for stroke versus a demyelinating process.  She will be hospitalized for further management.  Plan:  #1. Left-sided weakness: This is in the setting of previous history of transient numbness and weakness.  Previously seen by neurology and underwent lumbar puncture in January 2023.   Initially came in as code stroke which was canceled.  Neurology has been consulted.  Diagnosis is not entirely clear.  Patient has undergone MRI of the brain and cervical spine and thoracic spine.  Plan is to do a lumbar puncture which is being attempted by the ED provider. Stroke workup will also be initiated including echocardiogram HbA1c lipid panel PT OT SLP evaluation.  Holding off on antiplatelet agents as per neurology.  Will order carotid Dopplers.  Will defer other vascular evaluation including CT angiogram versus MRA head to neurology.  #2. Essential hypertension: Hold her home medications.  Permissive hypertension for now.  #3. Hypokalemia: Will be repleted.  Check magnesium.  #4. Microcytic anemia history of iron deficiency: Will check anemia panel.  No evidence of overt bleeding.  She does give a history of iron deficiency anemia.  She was on iron infusions previously but has received in a while.  #5. Thyroid nodules: Thyroid nodule noted on both lobes.  Recheck TSH.  This will need further workup in the outpatient setting.  DVT Prophylaxis: SCDs for now Code Status: Full code Family Communication: Discussed with the patient Disposition: To be determined Consults called: Neurology Admission  Status: Observation   Severity of Illness: The appropriate patient status for this patient is OBSERVATION. Observation status is judged to be reasonable and necessary in order to provide the required intensity of service to ensure the patient's safety. The patient's presenting symptoms, physical exam findings, and initial radiographic and laboratory data in the context of their medical condition is felt to place them at decreased risk for further clinical deterioration. Furthermore, it is anticipated that the patient will be medically stable for discharge from the hospital within 2 midnights of admission.    Further management decisions will depend on results of further testing and patient's response to treatment.   Maddison Kilner [employer]  [facility] operational officer on [email or web address]  04/09/2022, 3:31 PM

## 2022-04-09 NOTE — Progress Notes (Signed)
Spasms all over body Pain in right hand.

## 2022-04-09 NOTE — ED Notes (Signed)
Pt taken to IR

## 2022-04-09 NOTE — Patient Instructions (Signed)
Thank you for choosing Primary Care at [facility] for your medical home!    Connie Herrera was seen by Camillia Herter, NP today.   Calton Dach Kinner's primary care provider is Camillia Herter, NP.   For the best care possible,  you should try to see Durene Fruits, NP whenever you come to office.   We look forward to seeing you again soon!  If you have any questions about your visit today,  please call us at [phone]  Or feel free to reach your provider via [place].    Keeping you healthy   Get these tests Blood pressure- Have your blood pressure checked once a year by your healthcare provider.  Normal blood pressure is 120/80. Weight- Have your body mass index (BMI) calculated to screen for obesity.  BMI is a measure of body fat based on height and weight. You can also calculate your own BMI at GravelBags.it. Cholesterol- Have your cholesterol checked regularly starting at age 58, sooner may be necessary if you have diabetes, high blood pressure, if a family member developed heart diseases at an early age or if you smoke.  Chlamydia, HIV, and other sexual transmitted disease- Get screened each year until the age of 45 then within three months of each new sexual partner. Diabetes- Have your blood sugar checked regularly if you have high blood pressure, high cholesterol, a family history of diabetes or if you are overweight.   Get these vaccines Flu shot- Every fall. Tetanus shot- Every 10 years. Menactra- Single dose; prevents meningitis.   Take these steps Don't smoke- If you do smoke, ask your healthcare provider about quitting. For tips on how to quit, go to www.smokefree.gov or call 1-800-QUIT-NOW. Be physically active- Exercise 5 days a week for at least 30 minutes.  If you are not already physically active start slow and gradually work up to 30 minutes of moderate physical activity.  Examples of moderate activity include walking briskly, mowing the  yard, dancing, swimming bicycling, etc. Eat a healthy diet- Eat a variety of healthy foods such as fruits, vegetables, low fat milk, low fat cheese, yogurt, lean meats, poultry, fish, beans, tofu, etc.  For more information on healthy eating, go to www.thenutritionsource.org Drink alcohol in moderation- Limit alcohol intake two drinks or less a day.  Never drink and drive. Dentist- Brush and floss teeth twice daily; visit your dentis twice a year. Depression-Your emotional health is as important as your physical health.  If you're feeling down, losing interest in things you normally enjoy please talk with your healthcare provider. Gun Safety- If you keep a gun in your home, keep it unloaded and with the safety lock on.  Bullets should be stored separately. Helmet use- Always wear a helmet when riding a motorcycle, bicycle, rollerblading or skateboarding. Safe sex- If you may be exposed to a sexually transmitted infection, use a condom Seat belts- Seat bels can save your life; always wear one. Smoke/Carbon Monoxide detectors- These detectors need to be installed on the appropriate level of your home.  Replace batteries at least once a year. Skin Cancer- When out in the sun, cover up and use sunscreen SPF 15 or higher. Violence- If anyone is threatening or hurting you, please tell your healthcare provider.

## 2022-04-09 NOTE — ED Notes (Signed)
Patient transported to CT 

## 2022-04-09 NOTE — ED Triage Notes (Signed)
PT BIB [facility] from home as a code stroke. Pt has been experiencing L side weakness for over a week. Today her symptoms lingered longer and she has some drift and weak grip on the left. A&O X4. Walky talky.

## 2022-04-09 NOTE — Progress Notes (Signed)
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  No Known Allergies  Patient Measurements: Height: '5\' 6"'$  (167.6 cm) Weight: 106.6 kg (235 lb 0.2 oz) IBW/kg (Calculated) : 59.3 Heparin Dosing Weight: 84 Kg  Vital Signs: Temp: 98.2 F (36.8 C) (01/04 2000) Temp Source: Oral (01/04 2000) BP: 168/98 (01/04 2000) Pulse Rate: 78 (01/04 2030)  Labs: Recent Labs    04/09/22 1315 04/09/22 1322  HGB 10.1* 11.2*  HCT 34.5* 33.0*  PLT 424*  --   APTT 28  --   LABPROT 13.2  --   INR 1.0  --   CREATININE 0.69 0.50    Estimated Creatinine Clearance: 108.5 mL/min (by C-G formula based on SCr of 0.5 mg/dL).   Medical History: Past Medical History:  Diagnosis Date   Brain lesion    COVID-19 04/2020   Diplopia    Hypertension    Iron deficiency anemia      Assessment: Connie Herrera presenting as code stroke. 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 Neurology has consulted pharmacy to dose heparin with conservative goal and No bolus. Patient was not on anticoagulation prior to admission. Hgb 11.2, PLT 424  Goal of Therapy:  Heparin level 0.3-0.5 units/ml (NO Bolus) Monitor platelets by anticoagulation protocol: Yes   Plan:  Start heparin infusion at 1000 units/hr Check anti-Xa level in 6 hours and daily while on heparin Continue to monitor H&H and platelets  Connie Herrera, PharmD Clinical Pharmacist 04/09/2022 11:51 PM Please check AMION for all [address] numbers\

## 2022-04-10 ENCOUNTER — Observation Stay (HOSPITAL_COMMUNITY): Payer: BLUE CROSS/BLUE SHIELD

## 2022-04-10 ENCOUNTER — Inpatient Hospital Stay (HOSPITAL_COMMUNITY): Payer: BLUE CROSS/BLUE SHIELD

## 2022-04-10 ENCOUNTER — Encounter (HOSPITAL_COMMUNITY): Payer: Self-pay | Admitting: Internal Medicine

## 2022-04-10 DIAGNOSIS — I639 Cerebral infarction, unspecified: Secondary | ICD-10-CM | POA: Diagnosis not present

## 2022-04-10 DIAGNOSIS — E871 Hypo-osmolality and hyponatremia: Secondary | ICD-10-CM | POA: Diagnosis not present

## 2022-04-10 DIAGNOSIS — D1802 Hemangioma of intracranial structures: Secondary | ICD-10-CM | POA: Diagnosis present

## 2022-04-10 DIAGNOSIS — I6920 Unspecified sequelae of other nontraumatic intracranial hemorrhage: Secondary | ICD-10-CM | POA: Diagnosis not present

## 2022-04-10 DIAGNOSIS — Z6837 Body mass index (BMI) 37.0-37.9, adult: Secondary | ICD-10-CM | POA: Diagnosis not present

## 2022-04-10 DIAGNOSIS — E669 Obesity, unspecified: Secondary | ICD-10-CM | POA: Diagnosis present

## 2022-04-10 DIAGNOSIS — Z803 Family history of malignant neoplasm of breast: Secondary | ICD-10-CM | POA: Diagnosis not present

## 2022-04-10 DIAGNOSIS — Z79899 Other long term (current) drug therapy: Secondary | ICD-10-CM | POA: Diagnosis not present

## 2022-04-10 DIAGNOSIS — R29898 Other symptoms and signs involving the musculoskeletal system: Secondary | ICD-10-CM | POA: Diagnosis present

## 2022-04-10 DIAGNOSIS — I6389 Other cerebral infarction: Secondary | ICD-10-CM

## 2022-04-10 DIAGNOSIS — R29702 NIHSS score 2: Secondary | ICD-10-CM | POA: Diagnosis present

## 2022-04-10 DIAGNOSIS — M329 Systemic lupus erythematosus, unspecified: Secondary | ICD-10-CM | POA: Diagnosis present

## 2022-04-10 DIAGNOSIS — E041 Nontoxic single thyroid nodule: Secondary | ICD-10-CM | POA: Diagnosis present

## 2022-04-10 DIAGNOSIS — D259 Leiomyoma of uterus, unspecified: Secondary | ICD-10-CM | POA: Diagnosis present

## 2022-04-10 DIAGNOSIS — F1210 Cannabis abuse, uncomplicated: Secondary | ICD-10-CM | POA: Diagnosis present

## 2022-04-10 DIAGNOSIS — I749 Embolism and thrombosis of unspecified artery: Secondary | ICD-10-CM

## 2022-04-10 DIAGNOSIS — G4700 Insomnia, unspecified: Secondary | ICD-10-CM | POA: Diagnosis present

## 2022-04-10 DIAGNOSIS — K5900 Constipation, unspecified: Secondary | ICD-10-CM | POA: Diagnosis present

## 2022-04-10 DIAGNOSIS — D500 Iron deficiency anemia secondary to blood loss (chronic): Secondary | ICD-10-CM

## 2022-04-10 DIAGNOSIS — I742 Embolism and thrombosis of arteries of the upper extremities: Secondary | ICD-10-CM | POA: Diagnosis not present

## 2022-04-10 DIAGNOSIS — I708 Atherosclerosis of other arteries: Secondary | ICD-10-CM | POA: Diagnosis not present

## 2022-04-10 DIAGNOSIS — I748 Embolism and thrombosis of other arteries: Secondary | ICD-10-CM | POA: Diagnosis present

## 2022-04-10 DIAGNOSIS — I119 Hypertensive heart disease without heart failure: Secondary | ICD-10-CM | POA: Diagnosis not present

## 2022-04-10 DIAGNOSIS — Z87891 Personal history of nicotine dependence: Secondary | ICD-10-CM | POA: Diagnosis not present

## 2022-04-10 DIAGNOSIS — I771 Stricture of artery: Secondary | ICD-10-CM | POA: Diagnosis not present

## 2022-04-10 DIAGNOSIS — N920 Excessive and frequent menstruation with regular cycle: Secondary | ICD-10-CM | POA: Diagnosis present

## 2022-04-10 DIAGNOSIS — I6990 Unspecified sequelae of unspecified cerebrovascular disease: Secondary | ICD-10-CM | POA: Diagnosis not present

## 2022-04-10 DIAGNOSIS — F419 Anxiety disorder, unspecified: Secondary | ICD-10-CM | POA: Diagnosis present

## 2022-04-10 DIAGNOSIS — Z8616 Personal history of COVID-19: Secondary | ICD-10-CM | POA: Diagnosis not present

## 2022-04-10 DIAGNOSIS — E876 Hypokalemia: Secondary | ICD-10-CM | POA: Diagnosis present

## 2022-04-10 DIAGNOSIS — I10 Essential (primary) hypertension: Secondary | ICD-10-CM | POA: Diagnosis present

## 2022-04-10 DIAGNOSIS — I776 Arteritis, unspecified: Secondary | ICD-10-CM | POA: Diagnosis present

## 2022-04-10 LAB — COMPREHENSIVE METABOLIC PANEL
ALT: 24 U/L (ref 0–44)
AST: 22 U/L (ref 15–41)
Albumin: 3.3 g/dL — ABNORMAL LOW (ref 3.5–5.0)
Alkaline Phosphatase: 77 U/L (ref 38–126)
Anion gap: 8 (ref 5–15)
BUN: 5 mg/dL — ABNORMAL LOW (ref 6–20)
CO2: 24 mmol/L (ref 22–32)
Calcium: 8.7 mg/dL — ABNORMAL LOW (ref 8.9–10.3)
Chloride: 101 mmol/L (ref 98–111)
Creatinine, Ser: 0.68 mg/dL (ref 0.44–1.00)
GFR, Estimated: >60 mL/min (ref >60)
Glucose, Bld: 101 mg/dL — ABNORMAL HIGH (ref 70–99)
Potassium: 3.3 mmol/L — ABNORMAL LOW (ref 3.5–5.1)
Sodium: 133 mmol/L — ABNORMAL LOW (ref 135–145)
Total Bilirubin: 0.4 mg/dL (ref 0.3–1.2)
Total Protein: 6.8 g/dL (ref 6.5–8.1)

## 2022-04-10 LAB — FERRITIN: Ferritin: 5 ng/mL — ABNORMAL LOW (ref 11–307)

## 2022-04-10 LAB — IRON AND TIBC
Iron: 26 ug/dL — ABNORMAL LOW (ref 28–170)
Saturation Ratios: 5 % — ABNORMAL LOW (ref 10.4–31.8)
TIBC: 533 ug/dL — ABNORMAL HIGH (ref 250–450)
UIBC: 507 ug/dL

## 2022-04-10 LAB — ECHOCARDIOGRAM COMPLETE BUBBLE STUDY
Area-P 1/2: 3.88 cm2
Calc EF: 59.2 %
S' Lateral: 3.5 cm
Single Plane A2C EF: 53.5 %
Single Plane A4C EF: 60.7 %

## 2022-04-10 LAB — LIPID PANEL
Cholesterol: 138 mg/dL (ref 0–200)
HDL: 36 mg/dL — ABNORMAL LOW (ref >40)
LDL Cholesterol: 68 mg/dL (ref 0–99)
Total CHOL/HDL Ratio: 3.8 RATIO
Triglycerides: 171 mg/dL — ABNORMAL HIGH (ref <150)
VLDL: 34 mg/dL (ref 0–40)

## 2022-04-10 LAB — CBC
HCT: 33.9 % — ABNORMAL LOW (ref 36.0–46.0)
Hemoglobin: 10.5 g/dL — ABNORMAL LOW (ref 12.0–15.0)
MCH: 22.6 pg — ABNORMAL LOW (ref 26.0–34.0)
MCHC: 31 g/dL (ref 30.0–36.0)
MCV: 73.1 fL — ABNORMAL LOW (ref 80.0–100.0)
Platelets: 441 10*3/uL — ABNORMAL HIGH (ref 150–400)
RBC: 4.64 MIL/uL (ref 3.87–5.11)
RDW: 16.7 % — ABNORMAL HIGH (ref 11.5–15.5)
WBC: 8.2 10*3/uL (ref 4.0–10.5)
nRBC: 0 % (ref 0.0–0.2)

## 2022-04-10 LAB — VITAMIN B12: Vitamin B-12: 329 pg/mL (ref 180–914)

## 2022-04-10 LAB — RETICULOCYTES
Immature Retic Fract: 23 % — ABNORMAL HIGH (ref 2.3–15.9)
RBC.: 4.65 MIL/uL (ref 3.87–5.11)
Retic Count, Absolute: 78.1 10*3/uL (ref 19.0–186.0)
Retic Ct Pct: 1.7 % (ref 0.4–3.1)

## 2022-04-10 LAB — TSH: TSH: 2.461 u[IU]/mL (ref 0.350–4.500)

## 2022-04-10 LAB — HEPARIN LEVEL (UNFRACTIONATED): Heparin Unfractionated: <0.1 IU/mL — ABNORMAL LOW (ref 0.30–0.70)

## 2022-04-10 LAB — FOLATE: Folate: 11.2 ng/mL (ref >5.9)

## 2022-04-10 LAB — MAGNESIUM: Magnesium: 1.8 mg/dL (ref 1.7–2.4)

## 2022-04-10 LAB — HEMOGLOBIN A1C
Hgb A1c MFr Bld: 6 % — ABNORMAL HIGH (ref 4.8–5.6)
Mean Plasma Glucose: 126 mg/dL

## 2022-04-10 LAB — RPR: RPR Ser Ql: NONREACTIVE

## 2022-04-10 MED ORDER — CLOPIDOGREL BISULFATE 75 MG PO TABS: 75.0000 mg | ORAL_TABLET | Freq: Every day | ORAL | Status: DC

## 2022-04-10 MED ORDER — OXYCODONE HCL 5 MG PO TABS
5.0000 mg | ORAL_TABLET | Freq: Four times a day (QID) | ORAL | Status: DC | PRN
  Administered 2022-04-10 – 2022-04-17 (×17): 5 mg via ORAL
  Filled 2022-04-10 (×18): qty 1

## 2022-04-10 MED ORDER — ASPIRIN 81 MG PO TBEC
81.0000 mg | DELAYED_RELEASE_TABLET | Freq: Every day | ORAL | Status: DC
  Administered 2022-04-11: 81 mg via ORAL
  Filled 2022-04-10: qty 1

## 2022-04-10 MED ORDER — POTASSIUM CHLORIDE CRYS ER 20 MEQ PO TBCR
40.0000 meq | EXTENDED_RELEASE_TABLET | Freq: Once | ORAL | Status: AC
  Administered 2022-04-10: 40 meq via ORAL
  Filled 2022-04-10: qty 2

## 2022-04-10 NOTE — ED Notes (Signed)
Sitting up in chair eating breakfast. VSS. No signs of distress.

## 2022-04-10 NOTE — ED Notes (Signed)
Ambulatory to bathroom to void and wash up. Gait steady. No signs of distress. Neuro WDL.

## 2022-04-10 NOTE — ED Notes (Signed)
Patient complaining of stomach cramps and epigastric discomfort.

## 2022-04-10 NOTE — Progress Notes (Addendum)
STROKE TEAM PROGRESS NOTE   INTERVAL HISTORY  Connie Herrera is a 47 y.o. female with past medical history significant for hypertension.   She has frequent transient neurological symptoms of numbness and weakness that are transient and self resolved.  On 04/09/22 she developed sudden onset left upper extremity weakness did not resolve and therefore EMS activated code stroke for evaluation   She has had extensive prior neurological evaluation for abnormal MRI scan showing nonspecific T2/FLAIR white matter hyperintensities raising concern for multiple sclerosis but essentially negative workup for the same including lumbar puncture most recently 05/05/2021 which was normal, and last saw Dr. Leta Baptist 09/30/2020  is at the bedside.   Today the patient states her fingers tips have been hurting and sore for the past 2 weeks. Left leg weakness has resolved and left arm weakness has improved. MRI scan of the brain shows multiple small ill-defined hyperintensities on diffusion imaging and bilateral occipital and parietal lobes and some of them showing mild enhancement raising concern for subacute embolic infarcts though demyelinating disease cannot be ruled out.  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  Patient had spinal tap which is quite benign with 0 WBCs and normal protein and glucose.  Oligoclonal bands are pending.  Lab work for hypercoagulable panel and vasculitis is pending but was negative previously.  MRI scan of cervical and thoracic spine did not show any demyelinating lesions. CT angiogram of the neck shows filling defect in the right brachiocephalic artery concerning for acute intraluminal thrombus superimposed upon subacute lesion.  Patient has been complaining of tingling numbness and pain in the right upper extremity for the last few weeks.  CBC:  Recent Labs  Lab 04/09/22 1315 04/09/22 1322 04/10/22 0443  WBC 7.2  --  8.2   NEUTROABS 5.3  --   --   HGB 10.1* 11.2* 10.5*  HCT 34.5* 33.0* 33.9*  MCV 76.2*  --  73.1*  PLT 424*  --  250*   Basic Metabolic Panel:  Recent Labs  Lab 04/09/22 1315 04/09/22 1322 04/10/22 0443  NA 137 139 133*  K 3.2* 3.2* 3.3*  CL 105 103 101  CO2 23  --  24  GLUCOSE 100* 97 101*  BUN 6 5* 5*  CREATININE 0.69 0.50 0.68  CALCIUM 8.3*  --  8.7*  MG  --   --  1.8   Lipid Panel:  Recent Labs  Lab 04/10/22 0443  CHOL 138  TRIG 171*  HDL 36*  CHOLHDL 3.8  VLDL 34  LDLCALC 68    Urine Drug Screen:  Recent Labs  Lab 04/09/22 2021  LABOPIA NONE DETECTED  COCAINSCRNUR NONE DETECTED  LABBENZ POSITIVE*  AMPHETMU NONE DETECTED  THCU POSITIVE*  LABBARB NONE DETECTED    Alcohol Level  Recent Labs  Lab 04/09/22 1315  ETH <10    IMAGING past 24 hours US THYROID  Result Date: 04/10/2022 CLINICAL DATA:  Multiple thyroid nodules EXAM: THYROID ULTRASOUND TECHNIQUE: Ultrasound examination of the thyroid gland and adjacent soft tissues was performed. COMPARISON:  MRI cervical spine 04/09/2022 FINDINGS: Parenchymal Echotexture: Mildly heterogenous Isthmus: 0.8 cm Right lobe: 5.5 x 2.2 x 2.6 cm Left lobe: 6.2 x 2.3 x 2.4 cm _________________________________________________________ Estimated total number of nodules >/= 1 cm: 1 Number of spongiform nodules >/=  2 cm not described below (TR1): 0 Number of mixed cystic and solid nodules >/= 1.5 cm not described below ([place]): 0 _________________________________________________________ Nodule # 1:  Location: Left; inferior Maximum size: 2.8 cm; Other 2 dimensions: 1.8 x 1.6 cm Composition: solid/almost completely solid (2) Echogenicity: hypoechoic (2) Shape: not taller-than-wide (0) Margins: smooth (0) Echogenic foci: none (0) ACR TI-RADS total points: 4. ACR TI-RADS risk category: TR4 (4-6 points). ACR TI-RADS recommendations: **Given size (>/= 1.5 cm) and appearance, fine needle aspiration of this moderately suspicious nodule should be  considered based on TI-RADS criteria. This nodule corresponds to the left thyroid abnormality seen on recent MRI. No discrete abnormality identified in the right thyroid lobe. _________________________________________________________ IMPRESSION: Nodule 1 (TI-RADS 4) located in the inferior left thyroid lobe measuring 2.8 x 1.8 x 1.6 cm, meets criteria for FNA. The above is in keeping with the ACR TI-RADS recommendations - J Am Coll Radiol 2017;14:587-595. Electronically Signed   By: Miachel Roux M.D.   On: 04/10/2022 15:45   ECHOCARDIOGRAM COMPLETE BUBBLE STUDY  Result Date: 04/10/2022    ECHOCARDIOGRAM REPORT   Patient Name:   Connie Herrera Date of Exam: 04/10/2022 Medical Rec #:  [id number]            Height:       66.0 in Accession #:    [id number]           Weight:       235.0 lb Date of Birth:  05-25-1975           BSA:          2.142 m Patient Age:    3 years             BP:           147/88 mmHg Patient Gender: F                    HR:           84 bpm. Exam Location:  Inpatient Procedure: 2D Echo, Cardiac Doppler, Color Doppler and Saline Contrast Bubble            Study Indications:    Stroke  History:        Patient has no prior history of Echocardiogram examinations. No                 prior cardiac history.  Sonographer:    Roseanna Rainbow RDCS Referring Phys: [id number] [place]  Sonographer Comments: Suboptimal parasternal window. Patient was very sensitive to pressure from probe IMPRESSIONS  1. Left ventricular ejection fraction, by estimation, is 55 to 60%. The left ventricle has normal function. The left ventricle has no regional wall motion abnormalities. There is mild concentric left ventricular hypertrophy. Left ventricular diastolic parameters are consistent with Grade I diastolic dysfunction (impaired relaxation).  2. Right ventricular systolic function is normal. The right ventricular size is normal. Tricuspid regurgitation signal is inadequate for assessing PA pressure.  3. No  evidence of mitral valve regurgitation.  4. The aortic valve was not well visualized. Aortic valve regurgitation is not visualized.  5. The inferior vena cava is normal in size with greater than 50% respiratory variability, suggesting right atrial pressure of 3 mmHg. Comparison(s): No prior Echocardiogram. FINDINGS  Left Ventricle: Left ventricular ejection fraction, by estimation, is 55 to 60%. The left ventricle has normal function. The left ventricle has no regional wall motion abnormalities. The left ventricular internal cavity size was normal in size. There is  mild concentric left ventricular hypertrophy. Left ventricular diastolic parameters are consistent with Grade I diastolic dysfunction (impaired relaxation). Right Ventricle: The  right ventricular size is normal. Right ventricular systolic function is normal. Tricuspid regurgitation signal is inadequate for assessing PA pressure. Left Atrium: Left atrial size was normal in size. Right Atrium: Right atrial size was normal in size. Pericardium: There is no evidence of pericardial effusion. Mitral Valve: No evidence of mitral valve regurgitation. Tricuspid Valve: Tricuspid valve regurgitation is not demonstrated. Aortic Valve: The aortic valve was not well visualized. Aortic valve regurgitation is not visualized. Pulmonic Valve: Pulmonic valve regurgitation is not visualized. Aorta: The aortic root and ascending aorta are structurally normal, with no evidence of dilitation. Venous: The inferior vena cava is normal in size with greater than 50% respiratory variability, suggesting right atrial pressure of 3 mmHg. IAS/Shunts: No atrial level shunt detected by color flow Doppler. Agitated saline contrast was given intravenously to evaluate for intracardiac shunting.  LEFT VENTRICLE PLAX 2D LVIDd:         5.00 cm      Diastology LVIDs:         3.50 cm      LV e' medial:    6.09 cm/s LV PW:         1.30 cm      LV E/e' medial:  11.4 LV IVS:        1.10 cm      LV  e' lateral:   9.79 cm/s LVOT diam:     2.20 cm      LV E/e' lateral: 7.1 LV SV:         68 LV SV Index:   32 LVOT Area:     3.80 cm  LV Volumes (MOD) LV vol d, MOD A2C: 64.7 ml LV vol d, MOD A4C: 114.0 ml LV vol s, MOD A2C: 30.1 ml LV vol s, MOD A4C: 44.8 ml LV SV MOD A2C:     34.6 ml LV SV MOD A4C:     114.0 ml LV SV MOD BP:      53.0 ml RIGHT VENTRICLE             IVC RV S prime:     12.00 cm/s  IVC diam: 1.00 cm TAPSE (M-mode): 2.0 cm LEFT ATRIUM             Index        RIGHT ATRIUM           Index LA diam:        2.40 cm 1.12 cm/m   RA Area:     12.50 cm LA Vol (A2C):   40.1 ml 18.72 ml/m  RA Volume:   29.20 ml  13.63 ml/m LA Vol (A4C):   39.9 ml 18.63 ml/m LA Biplane Vol: 41.2 ml 19.24 ml/m  AORTIC VALVE LVOT Vmax:   108.00 cm/s LVOT Vmean:  69.400 cm/s LVOT VTI:    0.178 m  AORTA Ao Root diam: 3.40 cm Ao Asc diam:  3.70 cm MITRAL VALVE MV Area (PHT): 3.88 cm    SHUNTS MV Decel Time: 196 msec    Systemic VTI:  0.18 m MV E velocity: 69.40 cm/s  Systemic Diam: 2.20 cm MV A velocity: 82.00 cm/s MV E/A ratio:  0.85 Landscape architect signed by Phineas Inches Signature Date/Time: 04/10/2022/9:27:28 AM    Final    CT ANGIO HEAD NECK W WO CM  Result Date: 04/09/2022 CLINICAL DATA:  Subacute infarcts in the bilateral parietal and occipital lobes on same-day MRI; bilateral hand pain and numbness EXAM: CT ANGIOGRAPHY HEAD AND NECK TECHNIQUE:  Multidetector CT imaging of the head and neck was performed using the standard protocol during bolus administration of intravenous contrast. Multiplanar CT image reconstructions and MIPs were obtained to evaluate the vascular anatomy. Carotid stenosis measurements (when applicable) are obtained utilizing NASCET criteria, using the distal internal carotid diameter as the denominator. RADIATION DOSE REDUCTION: This exam was performed according to the departmental dose-optimization program which includes automated exposure control, adjustment of the mA and/or kV according  to patient size and/or use of iterative reconstruction technique. CONTRAST:  36m OMNIPAQUE IOHEXOL 350 MG/ML SOLN COMPARISON:  CT head 10/22/2021, MRI head 04/09/2022; no prior CTA FINDINGS: CT HEAD FINDINGS Brain: No evidence of acute infarct, hemorrhage, mass, mass effect, or midline shift. No hydrocephalus or extra-axial fluid collection. In the possible infarcts noted on the same-day MRI are not definitively seen on this exam. Vascular: No hyperdense vessel. Skull: Normal. Negative for fracture or focal lesion. Sinuses/Orbits: No acute finding. Other: The mastoid air cells are well aerated. 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 Standard aortic branching. Right carotid system: No evidence of stenosis, dissection, or occlusion. Left carotid system: No evidence of stenosis, dissection, or occlusion. Vertebral arteries: No evidence of stenosis, dissection, or occlusion. Skeleton: No acute osseous abnormality. Other neck: The thyroid nodules noted on the same-day MRI are not as discrete on CT, possibly due to photon starvation in this area due to overlapping soft tissues. No additional acute finding in the neck. Upper chest: No focal pulmonary opacity or pleural effusion. Review of the MIP images confirms the above findings CTA HEAD FINDINGS Anterior circulation: Both internal carotid arteries are patent to the termini, without significant stenosis. A1 segments patent. Normal anterior communicating artery. Anterior cerebral arteries are patent to their distal aspects. No M1 stenosis or occlusion. MCA branches perfused and symmetric. Posterior circulation: Vertebral arteries patent to the vertebrobasilar junction without stenosis. Posterior inferior cerebellar arteries patent proximally. Basilar patent to its distal aspect. Superior cerebellar arteries patent proximally. Patent P1  segments. PCAs perfused to their distal aspects without stenosis. The bilateral posterior communicating arteries are patent. Venous sinuses: As permitted by contrast timing, patent. Anatomic variants: None significant. Review of the MIP images confirms the above findings IMPRESSION: 1. 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 2. No acute intracranial process. The infarcts seen on the 04/09/2022 MRI are not apparent on CT. 3. No intracranial large vessel occlusion or significant stenosis. 4. No hemodynamically significant stenosis in the neck. 5. The thyroid nodules noted on the 04/09/2022 MRI of the cervical spine are not apparent on this study. Further evaluation with nonemergent ultrasound is recommended. (Reference: J Am Coll Radiol. 2015 Feb;12(2): 143-50) These results will be called to the ordering clinician or representative by the Radiologist Assistant, and communication documented in the PACS or [employer] Electronically Signed   By: AMerilyn BabaM.D.   On: 04/09/2022 23:27    PHYSICAL EXAM Gen: Pleasant middle-aged obese African-American lady patient alert, cooperative, conversant and in no apparent distress   Neurological exam Mental status: Alert and oriented to person, place, month, and year. Speech clear, language fluent, follows commands appropriately,  No dysarthria or aphasia. Cranial Nerve Exam CN II: Visual fields intact to finger counting in all four quadrants CN. III, IV, VI: extraocular motility intact with no diplopia. There is not appreciable nystagmus beats CN V: Sensation is equal bilaterally CN  VII: There is no facial asymmetry  CN VIII: Hearing is intact to conversational tone and equal bilaterally CN. IX. and X: Palate elevates symmetrical CN XI: Trapezius strong bilateral and head rotation is equal CN XII: Tongue is midline with side to side movement intact  Motor: strength 5/5 in bilateral upper and lower  extremities. There is no pronator drift. Sensation : intact to light touch, bialterally  Coordination Tremors: none Rapidly alternating movements(RAM): left hand dysdiadochokinesis, left hand orbiting  Finger-nose : no dysmetria b/l  Nihss 0 premorbid modified Rankin score 1  ASSESSMENT/PLAN Ms. BHUMI GODBEY is a 47 y.o. female with history of hypertension presenting with left sided weakness and finger tips hurting.   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 Previously demonstrated left basal ganglia cavernoma is not clearly visible on this study. CTA head & neck Filling defect in the right brachiocephalic artery, concerning for intraluminal thrombus.No intracranial large vessel occlusion or significant stenosis.  No hemodynamically significant stenosis in the neck. MRI  brain- Multiple small areas of diffusion hyperintense signal in the occipital parietal lobes bilaterally. Some of these lesions do mild enhancement.Chronic hemorrhage in the left lateral basal ganglia with mild peripheral enhancement unchanged from the prior MRI. MRI cervical spine-Mild degenerative change in the cervical spine. No significant stenosis. No evidence of spinal infection. MRI thoracic-Mild thoracic degenerative change. No evidence of spinal infection or spinal stenosis in the thoracic spine. Echo bubble study EF 67% grade I dystolic dysfunction, no mitral valve regurgitation.  LDL 68     Diet   Diet Heart Room service appropriate? Yes; Fluid consistency: Thin  Dual antiplatelet therapy aspirin '81mg'$  daily and plavix '75mg'$  daily in conjuction with heparin gtt  discussed with Dr. Sabra Heck Recommend sleep study to evaluate for OSA LP 04/09/21 results pending  Right brachiocephalic artery intraluminal thrombus  Heparin gtt Hypercoagulable panel results  pending  Hypertension Home meds:  norvasc BP 148/99 this afternoon Permissive hypertension (OK if < 220/120) but gradually normalize in 5-7 days Long-term BP goal normotensive   Other Stroke Risk Factors Substance abuse - UDS:  THC POSITIVE, Cocaine NONE DETECTED. Patient advised to stop using due to stroke risk. Obesity, Body mass index is 37.93 kg/m., BMI >/= 30 associated with increased stroke risk, recommend weight loss, diet and exercise as appropriate    Hospital day # 0  Cecily Smith FNP-BC  STROKE MD NOTE ; I have personally obtained history,examined this patient, reviewed notes, independently viewed imaging studies, participated in medical decision making and plan of care.ROS completed by me personally and pertinent positives fully documented  I have made any additions or clarifications directly to the above note. Agree with note above.  Patient has had previous episodes of intermittent numbness as well as diplopia and now presents with sudden onset of left upper extremity weakness and MRI shows weak diffusion positive tiny bilateral parietal occipital lesions likely subacute infarcts.  She has had extensive negative workup in the past for demyelinating disease as well as vasculitis and present workup also does not suggest demyelinating disease.  Interestingly she is also has right brachial artery intraluminal thrombus raising concern for primary hypercoagulable disorder.  Continue IV heparin and vascular surgery consult for brachial artery thrombus.  Continue workup for hypercoagulable disorder and vasculitis.  Aspirin and Plavix for 3 months unless vascular surgery decides on oral anticoagulation.  Patient was counseled to quit using  marijuana and smoking cigarettes.  Patient also appears to be at risk for obstructive sleep apnea and may benefit with consideration for participation in sleep smart stroke prevention study.  She was given written information to review and decide.  She  was given adequate time to read informed consent form and ask questions.  It was made clear to her that study participation is voluntary and she can withdraw her consent at any time if she is not satisfied.  She was reassured she will get the same excellent medical care for stroke prevention whether she is part of the study or not.  She voiced understanding.  She signed informed consent voluntarily and will have screening overnight NOx 3 monitor for sleep apnea.  Discussed with Dr. [place] Pink Greater than 50% time during this 50-minute visit was spent on counseling and coordination of care about her cryptogenic strokes and discussion about stroke evaluation, prevention, treatment and sleep apnea and answering questions Antony Contras, MD Medical Director [place] Pager: [phone] 04/10/2022 8:32 PM   To contact Stroke Continuity provider, please refer to [email or web address] After hours, contact General Neurology

## 2022-04-10 NOTE — TOC Initial Note (Addendum)
Transition of Care [facility]) - Initial/Assessment Note    Patient Details  Name: Connie Herrera MRN: [id number] Date of Birth: [birth year]  Transition of Care [facility]) CM/SW Contact:    Verdell Carmine, RN Phone Number: 04/10/2022, 1:24 PM  Clinical Narrative:                  Transitions of Care ( TOC) has reviewed the patient data and found no needs at this time. The patient will be discussed on daily progression rounds. If needs are identified, please place a TOC consult       Patient Goals and CMS Choice            Expected Discharge Plan and Services                                              Prior Living Arrangements/Services                       Activities of Daily Living      Permission Sought/Granted                  Emotional Assessment              Admission diagnosis:  Left arm weakness [R29.898] Patient Active Problem List   Diagnosis Date Noted   Left arm weakness 04/09/2022   Iron deficiency anemia 04/09/2022   Hypokalemia 04/09/2022   Muscle spasms of both lower extremities 08/14/2020   Fibroid uterus 08/14/2020   Anemia due to blood loss, chronic 01/10/2020   Menorrhagia with regular cycle 01/09/2020   Class 1 obesity due to excess calories with body mass index (BMI) of 34.0 to 34.9 in adult 01/09/2020   Screening breast examination 02/07/2019   Breast pain 02/07/2019   Hypertension 01/26/2019   Paresthesias 08/04/2013   Breast pain, right 03/11/2012   PCP:  Camillia Herter, NP Pharmacy:   [id number] [place], [id number]: [phone]: [phone]), [place] [address] [place] [address]Phone: [phone]: [phone]    Social Determinants of Health (SDOH) Social History: SDOH Screenings   Food Insecurity: No  Food Insecurity (01/02/2021)  Transportation Needs: No Transportation Needs (01/02/2021)  Depression (PHQ2-9): Low Risk  (04/09/2022)  Tobacco Use: Medium Risk (04/10/2022)   SDOH Interventions:     Readmission Risk Interventions     No data to display

## 2022-04-10 NOTE — Evaluation (Signed)
Occupational Therapy Evaluation Patient Details Name: Connie Herrera MRN: [id number] DOB: Aug 14, 1975 Today's Date: 04/10/2022   History of Present Illness Patient is a 47 yo female presenting to the ED with left arm weakness on 04/09/21. MRI showing multiple small areas of diffusion hyperintense signal in the occipital parietal lobes bilaterally. 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 Lumbar puncture completed with neuro results pending. PMH includes: HTN, previous LP 1/23, and transient numbness and weakness which have resolved spontaneously   Clinical Impression   Prior to this admission, patient living independently, working as a Counsellor, and driving. Patient reports independence with all ADLs and IADLs. Patient is at her baseline with exception of hand, able to feed herself, and bathed herself independently at sink and completed item retrieval without issue, also observed by OT prior to entry to walk independently to the bathroom and return to stretcher without assistance. Patient with decreased [place] in LUE, but reports improvement from yesterday. OT educated on BE FAST acronym, and provided [facility] handout and stress ball to aid with LUE weakness. Patient instructed not to drive until neurology clears her, with patient in agreement. Due to current level OT evaluating and signing off, with no need for further follow up.      Recommendations for follow up therapy are one component of a multi-disciplinary discharge planning process, led by the attending physician.  Recommendations may be updated based on patient status, additional functional criteria and insurance authorization.   Follow Up Recommendations  No OT follow up     Assistance Recommended at Discharge PRN  Patient can return home with the following Assist for transportation    Functional Status Assessment  Patient has had a recent decline in their  functional status and demonstrates the ability to make significant improvements in function in a reasonable and predictable amount of time.  Equipment Recommendations  None recommended by OT    Recommendations for Other Services       Precautions / Restrictions Precautions Precautions: Fall Restrictions Weight Bearing Restrictions: No      Mobility Bed Mobility Overal bed mobility: Independent                  Transfers Overall transfer level: Independent Equipment used: None                      Balance                                           ADL either performed or assessed with clinical judgement   ADL Overall ADL's : At baseline                                       General ADL Comments: Patient is at her baseline with exception of hand, able to feed herself, and bathed herself independently at sink and completed item retrieval without issue, also observed by OT prior to entry to walk independently to the bathroom and return to stretcher without assistance     Vision Baseline Vision/History: 0 No visual deficits Ability to See in Adequate Light: 0 Adequate Patient Visual Report: No change from baseline       Perception     Praxis  Pertinent Vitals/Pain Pain Assessment Pain Assessment: 0-10 Pain Score: 8  Pain Location: back Pain Descriptors / Indicators: Discomfort, Grimacing, Guarding Pain Intervention(s): Limited activity within patient's tolerance, Monitored during session, Repositioned     Hand Dominance Right   Extremity/Trunk Assessment Upper Extremity Assessment Upper Extremity Assessment: LUE deficits/detail LUE Deficits / Details: weakness 4/5 throughout, grip strength 3/5 LUE Sensation: WNL LUE Coordination: decreased fine motor;decreased gross motor   Lower Extremity Assessment Lower Extremity Assessment: Overall WFL for tasks assessed   Cervical / Trunk Assessment Cervical /  Trunk Assessment: Normal   Communication Communication Communication: No difficulties   Cognition Arousal/Alertness: Awake/alert Behavior During Therapy: WFL for tasks assessed/performed Overall Cognitive Status: Within Functional Limits for tasks assessed                                       General Comments  Educated on BE FAST acronym, provided [facility] handout and stress ball to aid with LUE weakness    Exercises     Shoulder Instructions      Home Living Family/patient expects to be discharged to:: Private residence Living Arrangements: Alone Available Help at Discharge: Family;Available PRN/intermittently Type of Home: House Home Access: Stairs to enter [employer] of Steps: 5 Entrance Stairs-Rails: Right Home Layout: One level     Bathroom Shower/Tub: Teacher, early years/pre: Standard Bathroom Accessibility: Yes              Prior Functioning/Environment Prior Level of Function : Independent/Modified Independent;Working/employed;Driving             Mobility Comments: independent ADLs Comments: independent        OT Problem List: Impaired UE functional use      OT Treatment/Interventions:      OT Goals(Current goals can be found in the care plan section) Acute Rehab OT Goals Patient Stated Goal: to get back to normal OT Goal Formulation: With patient Time For Goal Achievement: 04/24/22 Potential to Achieve Goals: Good  OT Frequency:      Co-evaluation              AM-PAC OT "6 Clicks" Daily Activity     Outcome Measure Help from another person eating meals?: None Help from another person taking care of personal grooming?: None Help from another person toileting, which includes using toliet, bedpan, or urinal?: None Help from another person bathing (including washing, rinsing, drying)?: None Help from another person to put on and taking off regular upper body clothing?: None Help from another person  to put on and taking off regular lower body clothing?: None 6 Click Score: 24   End of Session Nurse Communication: Mobility status  Activity Tolerance: Patient tolerated treatment well Patient left: in bed;with call bell/phone within reach  OT Visit Diagnosis: Other symptoms and signs involving the nervous system (R29.898)                Time: 4235-3614 OT Time Calculation (min): 14 min Charges:  OT General Charges $OT Visit: 1 Visit OT Evaluation $OT Eval Moderate Complexity: 1 Mod  Connie Herrera, OTR/L Acute Rehabilitation Services [phone]   Connie Herrera 04/10/2022, 10:44 AM

## 2022-04-10 NOTE — Consult Note (Addendum)
[facility] OF Connie Herrera NOTE   MRN : [id number]  Reason for Consult: left UE weakness Referring Physician: Dr. [place] Pink [facility]  History of Present Illness: 47 y/o female with reported left UE weakness.  She had a similar event a week prior with finger tip numbness in the right UE that resolved spontaneously.  She reported  to the ED with f/u MRI of the brain showing multiple small areas of diffusion hyperintense signal in the occipital parietal lobes bilaterally.   A  CTA showed a filling defect in the right brachiocephalic artery, concerning for intraluminal thrombus.Heparin was started and UE symptoms have currently resolved.    She has had multiple visit to the ER with work ups including MS r/o after spinal tap.      Past medical history includes HTN, hx of COVID, anemia and thrombocytosis.         Current Facility-Administered Medications  Medication Dose Route Frequency Provider Last Rate Last Admin   acetaminophen (TYLENOL) tablet 650 mg  650 mg Oral Q4H PRN Bonnielee Haff, MD   650 mg at 04/10/22 8841   Or   acetaminophen (TYLENOL) 160 MG/5ML solution 650 mg  650 mg Per Tube Q4H PRN Bonnielee Haff, MD       Or   acetaminophen (TYLENOL) suppository 650 mg  650 mg Rectal Q4H PRN Bonnielee Haff, MD       gadobutrol (GADAVIST) 1 MMOL/ML injection 10 mL  10 mL Intravenous Once PRN Bonnielee Haff, MD       heparin ADULT infusion 100 units/mL (25000 units/240m)  1,000 Units/hr Intravenous Continuous RDawayne Cirri RPH 10 mL/hr at 04/10/22 0009 1,000 Units/hr at 04/10/22 0009   oxyCODONE (Oxy IR/ROXICODONE) immediate release tablet 5 mg  5 mg Oral Q6H PRN KBonnielee Haff MD   5 mg at 04/10/22 1013    Pt meds include: Statin :no Betablocker: No ASA: No Other anticoagulants/antiplatelets: heparin  Past Medical History:  Diagnosis Date   Brain lesion    COVID-19 04/2020   Diplopia    Hypertension    Iron deficiency anemia     Past  Surgical History:  Procedure Laterality Date   TUBAL LIGATION      Social History Social History   Tobacco Use   Smoking status: Former    Packs/day: 0.50    Years: 4.00    Total pack years: 2.00    Types: Cigarettes    Quit date: 06/04/2017    Years since quitting: 4.8   Smokeless tobacco: Never  Vaping Use   Vaping Use: Never used  Substance Use Topics   Alcohol use: No   Drug use: Not Currently    Types: Marijuana    Comment: 09/30/20 maybe once a week    Family History Family History  Problem Relation Age of Onset   Healthy Mother    Cirrhosis Father    Cancer Maternal Grandmother [phone]      breast   Breast cancer Maternal Grandmother     No Known Allergies   REVIEW OF SYSTEMS  General: '[ ]'$  Weight loss, '[ ]'$  Fever, '[ ]'$  chills Neurologic: '[ ]'$  Dizziness, '[ ]'$  Blackouts, '[ ]'$  Seizure '[ ]'$  Stroke, '[ ]'$  "Mini stroke", '[ ]'$  Slurred speech, '[ ]'$  Temporary blindness; '[ ]'$  weakness in arms or legs, '[ ]'$  Hoarseness '[ ]'$  Dysphagia Cardiac: '[ ]'$  Chest pain/pressure, '[ ]'$  Shortness of breath at rest '[ ]'$  Shortness of breath with exertion, '[ ]'$  Atrial fibrillation or  irregular heartbeat  Vascular: '[ ]'$  Pain in legs with walking, '[ ]'$  Pain in legs at rest, '[ ]'$  Pain in legs at night,  '[ ]'$  Non-healing ulcer, '[ ]'$  Blood clot in vein/DVT,   Pulmonary: '[ ]'$  Home oxygen, '[ ]'$  Productive cough, '[ ]'$  Coughing up blood, '[ ]'$  Asthma,  '[ ]'$  Wheezing '[ ]'$  COPD Musculoskeletal:  '[ ]'$  Arthritis, '[ ]'$  Low back pain, '[ ]'$  Joint pain Hematologic: '[ ]'$  Easy Bruising, [ x] Anemia; '[ ]'$  Hepatitis Gastrointestinal: '[ ]'$  Blood in stool, '[ ]'$  Gastroesophageal Reflux/heartburn, Urinary: '[ ]'$  chronic Kidney disease, '[ ]'$  on HD - '[ ]'$  MWF or '[ ]'$  TTHS, '[ ]'$  Burning with urination, '[ ]'$  Difficulty urinating Skin: '[ ]'$  Rashes, '[ ]'$  Wounds Psychological: '[ ]'$  Anxiety, '[ ]'$  Depression  Physical Examination Vitals:   04/10/22 0630 04/10/22 0900 04/10/22 1200 04/10/22 1250  BP: (!) 148/88 (!) 148/99 (!) 157/104 (!) 141/96  Pulse: 84 85  79   Resp: 19 17 (!) 25 18  Temp:  98 F (36.7 C) 98.1 F (36.7 C) (!) 97 F (36.1 C)  TempSrc:  Oral Oral Oral  SpO2: 100% 100% 98% 96%  Weight:      Height:       Body mass index is 37.93 kg/m.  General:  WDWN in NAD HENT: WNL Eyes: Pupils equal Pulmonary: normal non-labored breathing , without Rales, rhonchi,  wheezing Cardiac: RRR, without  Murmurs, rubs or gallops; No carotid bruits Abdomen: soft, NT, no masses Skin: no rashes, ulcers noted;  no Gangrene , no cellulitis; no open wounds;  No ischemic skin changes to all 4 extremities  Vascular Exam/Pulses:radial/ulnar pulses palpable, AT pedal pulses palpable B LE   Musculoskeletal: no muscle wasting or atrophy; no edema  Neurologic: A&O X 3; Appropriate Affect ;  SENSATION: normal; MOTOR FUNCTION: 5/5 Symmetric B LE, no appreciated weakness on exam left LE  Speech is fluent/normal   Significant Diagnostic Studies: CBC Lab Results  Component Value Date   WBC 8.2 04/10/2022   HGB 10.5 (L) 04/10/2022   HCT 33.9 (L) 04/10/2022   MCV 73.1 (L) 04/10/2022   PLT 441 (H) 04/10/2022    BMET    Component Value Date/Time   NA 133 (L) 04/10/2022 0443   NA 140 01/09/2020 1351   K 3.3 (L) 04/10/2022 0443   CL 101 04/10/2022 0443   CO2 24 04/10/2022 0443   GLUCOSE 101 (H) 04/10/2022 0443   BUN 5 (L) 04/10/2022 0443   BUN 9 01/09/2020 1351   CREATININE 0.68 04/10/2022 0443   CALCIUM 8.7 (L) 04/10/2022 0443   GFRNONAA >60 04/10/2022 0443   GFRAA 96 01/09/2020 1351   Estimated Creatinine Clearance: 108.5 mL/min (by C-G formula based on SCr of 0.68 mg/dL).  COAG Lab Results  Component Value Date   INR 1.0 04/09/2022     Non-Invasive Vascular Imaging:   CTA Head and neck  IMPRESSION: 1. 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 2. No acute intracranial process. The infarcts seen on the 04/09/2022 MRI are not apparent on CT. 3. No intracranial large vessel  occlusion or significant stenosis. 4. No hemodynamically significant stenosis in the neck. 5. The thyroid nodules noted on the 04/09/2022 MRI of the cervical spine are not apparent on this study. Further evaluation with nonemergent ultrasound is recommended. (Reference: J Am Coll Radiol. 2015 Feb;12(2): 143-50)   ASSESSMENT/PLAN:  47 y/o with left UE weakness due to transient ischemic  event with MRI showing  multiple small areas of diffusion hyperintense signal in the occipital parietal lobes bilaterally.  Chronic changes were also noted.   Her right UE with tingling/numbness and finger tip pain on/off symptoms have resolved.  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   We recommend continued Heparin and plans for repeat CTA.  No urgernt intervention is indicated.       Roxy Horseman 04/10/2022 1:31 PM   VASCULAR STAFF ADDENDUM: I have independently interviewed and examined the patient. I agree with the above.  In short the patient is a 47 year old female with history of multiple presentations to the ED with waxing and waning numbness, tingling and motor loss to multiple extremities.  These episodes are transient, and there has been previous concern for multiple sclerosis.    Patient underwent MRI brain which showed multiple areas of diffusion hyperintense signal in the occipital and parietal lobes bilaterally.  There were chronic changes also noted.  The etiology of these findings was not clear, but were not consistent with usual findings of stroke.  There was concern for demyelinating process.  Lumbar puncture was negative for organisms.  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    On exam,Connie Herrera was doing well, with no complaints.  She was frustrated by her multiple presentations to the ED prior to admission, but stated she currently has no deficits, numbness,  tingling.  The tingling she appreciated in her legs earlier in the day has ceased.  On physical exam, she had easily palpable pulses in all 4 extremities. 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  I think this is an incidental finding not related to the myriad of symptoms she has complained about over the last several weeks.  I discussed with her that the lesion location is high risk for stroke with attempted intervention.  Systemic heparinization can help to decrease the size of the thrombus.She would benefit from continued heparinization with repeat CT in 48 hours. She is aware that there is a stroke risk associated with medical management as well however I think this is lower than the stroke risk associated with surgical intervention.  I am not concerned about distal embolization as she has a readily palpable pulses with no skin changes concerning for blue toe syndrome.  I will continue to follow her and will see her tomorrow.   Plan for CT angio of the chest Sunday. Recommend aspirin '81mg'$  daily and systemic heparin Please call if any bleeding events occur as this would change management.     Cassandria Santee, MD [facility] Phone Number: [phone] 04/10/2022 4:57 PM

## 2022-04-10 NOTE — Telephone Encounter (Signed)
Order complete. 

## 2022-04-10 NOTE — Progress Notes (Signed)
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  No Known Allergies  Patient Measurements: Height: '5\' 6"'$  (167.6 cm) Weight: 106.6 kg (235 lb 0.2 oz) IBW/kg (Calculated) : 59.3 Heparin Dosing Weight: 84 kg  Vital Signs: Temp: 98 F (36.7 C) (01/05 1632) Temp Source: Oral (01/05 1632) BP: 155/94 (01/05 1632) Pulse Rate: 87 (01/05 1632)  Labs: Recent Labs    04/09/22 1315 04/09/22 1322 04/10/22 0443 04/10/22 1509  HGB 10.1* 11.2* 10.5*  --   HCT 34.5* 33.0* 33.9*  --   PLT 424*  --  441*  --   APTT 28  --   --   --   LABPROT 13.2  --   --   --   INR 1.0  --   --   --   HEPARINUNFRC  --   --   --  <0.10*  CREATININE 0.69 0.50 0.68  --      Estimated Creatinine Clearance: 108.5 mL/min (by C-G formula based on SCr of 0.68 mg/dL).   Medical History: Past Medical History:  Diagnosis Date   Brain lesion    COVID-19 04/2020   Diplopia    Hypertension    Iron deficiency anemia      Assessment:  30 yoF presenting as code stroke. 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 Neurology has consulted pharmacy to dose heparin with conservative goal and no bolus. Patient was not on anticoagulation prior to admission.   Heparin level < 0.1, subtherapeutic Hgb 10.5, Plt 441 - stable  Goal of Therapy:  Heparin level 0.3-0.5 units/ml (NO Bolus) Monitor platelets by anticoagulation protocol: Yes   Plan:  Increase heparin infusion at 1150 units/hr Check heparin level in 6 hours until therapeutic x2 Monitor daily CBC, heparin level, and for s/sx of bleeding. F/u bilateral LE doppler studies  Luisa Hart, PharmD, BCPS Clinical Pharmacist 04/10/2022 5:18 PM   Please refer to [facility] for pharmacy phone number

## 2022-04-10 NOTE — Plan of Care (Signed)

## 2022-04-10 NOTE — Progress Notes (Signed)
  Echocardiogram 2D Echocardiogram has been performed.  Connie Herrera 04/10/2022, 8:44 AM

## 2022-04-10 NOTE — Progress Notes (Signed)
PT Cancellation Note  Patient Details Name: Connie Herrera MRN: [id number] DOB: Feb 05, 1976   Cancelled Treatment:    Reason Eval/Treat Not Completed: PT screened, no needs identified, will sign off Spoke with OT, RN, and pt.  Pt independent with mobility with good balance and no LE weakness.  Denies PT needs. Will sign off at this time. Abran Richard, PT Acute Rehab [facility] [phone]   Connie Herrera 04/10/2022, 12:42 PM

## 2022-04-10 NOTE — Progress Notes (Addendum)
Brief Neuro Update:  CT angio demonstrates a large right brachiocephalic artery intraluminal thrombus. Will start her on Heparin gtt Neuro scale.  Overnight Hospitalist team notified over secure chat, discussed with patient at the bedside.  Patient also reports pain and cramps in BL lower extremities and discussed concerns about clots in her legs. I think it is reasonable to look for venous thrombi given the noted arterial thrombus. Will get BL lower extremities dopplers too.  [place] Pager Number [phone]

## 2022-04-10 NOTE — Progress Notes (Signed)
TCD w/ bubble and lower extremity venous attempted. Transport request entered 12:56. General ultrasound with patient when transport arrived at 15:12. Will attempt again as patient schedule, department schedule, and neuro team schedule permits.   Darlin Coco, RDMS, RVT.

## 2022-04-10 NOTE — Progress Notes (Addendum)
[facility] PROGRESS NOTE   Connie Herrera ZCH:[id number] DOB: 1975/07/04 DOA: 04/09/2022  PCP: Camillia Herter, NP  Brief History/Interval Summary: 47 y.o. female with a past medical history of essential hypertension, previous history of transient numbness and weakness which have resolved spontaneously.  She has been seen by neurology previously.  Underwent lumbar puncture in January 2023.  She was in her usual state of health till this morning when she went to see her primary care provider.  This was for a routine visit.  She came back to her house and then noticed that her left arm was weak.  She had similar symptoms about a week ago but thought that weakness resolved spontaneously. In the emergency department she underwent MRI brain which showed multiple small areas of diffusion hyperintense signal in the occipital parietal lobes bilaterally.  Chronic changes were also noted.  Etiology for these findings were not clear.  Neurology was consulted.    Consultants: Neurology  Procedures: Lumbar puncture    Subjective/Interval History: Patient mentions that her left arm weakness appears to have improved.  Denies any new complaints overnight.    Assessment/Plan:  Left Arm Weakness This is in the setting of a previous history of transient numbness's.  Previously seen by neurology.  Underwent lumbar puncture in January 2023. Patient underwent MRI brain, MRI cervical and thoracic spine. Neurology was consulted at the time of admission.   MRI noted to be abnormal.  Concern for stroke versus demyelinating disease. Management per neurology. LDL noted to be 68.  HbA1c is pending. TSH 2.4.  RPR nonreactive.  HIV nonreactive LP was done and no organisms were noted.  Neurology to weigh in. Echocardiogram shows normal systolic function with grade 1 diastolic dysfunction. TRT evaluation is pending.  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   Started on IV heparin. Lower extremity Doppler studies pending.  May need hypercoagulable workup.  Essential hypertension Holding her medications for now.  Permissive hypertension.  Hypokalemia Will be repleted.  Magnesium is 1.8.  Hyponatremia Continue to monitor.  Microcytic anemia/iron deficiency This is as a result of menorrhagia.  Loss.  So her last month.  Hemoglobin is stable.  Anemia panel shows ferritin of 5, iron of 26, TIBC 533, percent saturation 5.  Vitamin B12 level is 287, folic acid 86.7. Will give her iron infusion either later today or tomorrow.  Thyroid nodules Noted on both lobes incidentally on imaging studies. TSH is normal.  Will go ahead with thyroid ultrasound since her overall diagnosis is not entirely clear.  Menorrhagia/uterine fibroids Has not followed up with gynecology in more than a year.  She was told that she would need to do so as soon as possible now that she is on anticoagulants.  This might worsen her menorrhagia.  Obesity Estimated body mass index is 37.93 kg/m as calculated from the following:   Height as of this encounter: '5\' 6"'$  (1.676 m).   Weight as of this encounter: 106.6 kg.   DVT Prophylaxis: On IV heparin Code Status: Full code Family Communication: Discussed with the patient Disposition Plan: Hopefully return home in improved  Status is: Observation The patient will require care spanning > 2 midnights and should be moved to inpatient because: Arterial thrombus requiring anticoagulation, left arm weakness    Medications: Scheduled: Continuous:  heparin 1,000 Units/hr (04/10/22 0009)   EHM:[id number] **OR** acetaminophen (TYLENOL) oral liquid 160 mg/5 mL **OR** acetaminophen, gadobutrol, oxyCODONE  Antibiotics: Anti-infectives (From admission,  onward)    None       Objective:  Vital Signs  Vitals:   04/10/22 0512 04/10/22 0600 04/10/22 0630 04/10/22 0900  BP: (!) 146/93  (!) 148/88 (!) 148/99  Pulse: 80 77  84 85  Resp: '19 19 19 17  '$ Temp: 97.8 F (36.6 C)   98 F (36.7 C)  TempSrc: Oral   Oral  SpO2: 98% 100% 100% 100%  Weight:      Height:       No intake or output data in the 24 hours ending 04/10/22 1002 Filed Weights   04/09/22 2023  Weight: 106.6 kg    General appearance: Awake alert.  In no distress Resp: Clear to auscultation bilaterally.  Normal effort Cardio: S1-S2 is normal regular.  No S3-S4.  No rubs murmurs or bruit GI: Abdomen is soft.  Nontender nondistended.  Bowel sounds are present normal.  No masses organomegaly Extremities: No edema.   Neurologic: Alert and oriented x3.  Left arm weakness appears to have improved.   Lab Results:  Data Reviewed: I have personally reviewed following labs and reports of the imaging studies  CBC: Recent Labs  Lab 04/09/22 1315 04/09/22 1322 04/10/22 0443  WBC 7.2  --  8.2  NEUTROABS 5.3  --   --   HGB 10.1* 11.2* 10.5*  HCT 34.5* 33.0* 33.9*  MCV 76.2*  --  73.1*  PLT 424*  --  441*    Basic Metabolic Panel: Recent Labs  Lab 04/09/22 1315 04/09/22 1322 04/10/22 0443  NA 137 139 133*  K 3.2* 3.2* 3.3*  CL 105 103 101  CO2 23  --  24  GLUCOSE 100* 97 101*  BUN 6 5* 5*  CREATININE 0.69 0.50 0.68  CALCIUM 8.3*  --  8.7*  MG  --   --  1.8    GFR: Estimated Creatinine Clearance: 108.5 mL/min (by C-G formula based on SCr of 0.68 mg/dL).  Liver Function Tests: Recent Labs  Lab 04/09/22 1315 04/10/22 0443  AST 20 22  ALT 24 24  ALKPHOS 76 77  BILITOT <0.1* 0.4  PROT 6.4* 6.8  ALBUMIN 3.4* 3.3*     Coagulation Profile: Recent Labs  Lab 04/09/22 1315  INR 1.0     CBG: Recent Labs  Lab 04/09/22 1038  GLUCAP 101*    Lipid Profile: Recent Labs    04/10/22 0443  CHOL 138  HDL 36*  LDLCALC 68  TRIG 171*  CHOLHDL 3.8    Thyroid Function Tests: Recent Labs    04/10/22 0443  TSH 2.461    Anemia Panel: Recent Labs    04/10/22 0443  VITAMINB12 329  FOLATE 11.2  FERRITIN 5*   TIBC 533*  IRON 26*  RETICCTPCT 1.7    Recent Results (from the past 240 hour(s))  CSF culture w Gram Stain     Status: None (Preliminary result)   Collection Time: 04/09/22  5:08 PM   Specimen: PATH Cytology CSF; Cerebrospinal Fluid  Result Value Ref Range Status   Specimen Description CSF  Final   Special Requests NONE  Final   Gram Stain NO WBC SEEN NO ORGANISMS SEEN CYTOSPIN SMEAR   Final   Culture   Final    NO GROWTH < 12 HOURS Performed at [facility], [place] [address] [place], [place] [address]    Report Status PENDING  Incomplete      Radiology Studies: ECHOCARDIOGRAM COMPLETE BUBBLE STUDY  Result Date: 04/10/2022  ECHOCARDIOGRAM REPORT   Patient Name:   KALAYAH LESKE Date of Exam: 04/10/2022 Medical Rec #:  [id number]            Height:       66.0 in Accession #:    [id number]           Weight:       235.0 lb Date of Birth:  1976-03-03           BSA:          2.142 m Patient Age:    33 years             BP:           147/88 mmHg Patient Gender: F                    HR:           84 bpm. Exam Location:  Inpatient Procedure: 2D Echo, Cardiac Doppler, Color Doppler and Saline Contrast Bubble            Study Indications:    Stroke  History:        Patient has no prior history of Echocardiogram examinations. No                 prior cardiac history.  Sonographer:    Roseanna Rainbow RDCS Referring Phys: [id number] [place]  Sonographer Comments: Suboptimal parasternal window. Patient was very sensitive to pressure from probe IMPRESSIONS  1. Left ventricular ejection fraction, by estimation, is 55 to 60%. The left ventricle has normal function. The left ventricle has no regional wall motion abnormalities. There is mild concentric left ventricular hypertrophy. Left ventricular diastolic parameters are consistent with Grade I diastolic dysfunction (impaired relaxation).  2. Right ventricular systolic function is normal. The right ventricular size is normal. Tricuspid  regurgitation signal is inadequate for assessing PA pressure.  3. No evidence of mitral valve regurgitation.  4. The aortic valve was not well visualized. Aortic valve regurgitation is not visualized.  5. The inferior vena cava is normal in size with greater than 50% respiratory variability, suggesting right atrial pressure of 3 mmHg. Comparison(s): No prior Echocardiogram. FINDINGS  Left Ventricle: Left ventricular ejection fraction, by estimation, is 55 to 60%. The left ventricle has normal function. The left ventricle has no regional wall motion abnormalities. The left ventricular internal cavity size was normal in size. There is  mild concentric left ventricular hypertrophy. Left ventricular diastolic parameters are consistent with Grade I diastolic dysfunction (impaired relaxation). Right Ventricle: The right ventricular size is normal. Right ventricular systolic function is normal. Tricuspid regurgitation signal is inadequate for assessing PA pressure. Left Atrium: Left atrial size was normal in size. Right Atrium: Right atrial size was normal in size. Pericardium: There is no evidence of pericardial effusion. Mitral Valve: No evidence of mitral valve regurgitation. Tricuspid Valve: Tricuspid valve regurgitation is not demonstrated. Aortic Valve: The aortic valve was not well visualized. Aortic valve regurgitation is not visualized. Pulmonic Valve: Pulmonic valve regurgitation is not visualized. Aorta: The aortic root and ascending aorta are structurally normal, with no evidence of dilitation. Venous: The inferior vena cava is normal in size with greater than 50% respiratory variability, suggesting right atrial pressure of 3 mmHg. IAS/Shunts: No atrial level shunt detected by color flow Doppler. Agitated saline contrast was given intravenously to evaluate for intracardiac shunting.  LEFT VENTRICLE PLAX 2D LVIDd:         5.00 cm  Diastology LVIDs:         3.50 cm      LV e' medial:    6.09 cm/s LV PW:          1.30 cm      LV E/e' medial:  11.4 LV IVS:        1.10 cm      LV e' lateral:   9.79 cm/s LVOT diam:     2.20 cm      LV E/e' lateral: 7.1 LV SV:         68 LV SV Index:   32 LVOT Area:     3.80 cm  LV Volumes (MOD) LV vol d, MOD A2C: 64.7 ml LV vol d, MOD A4C: 114.0 ml LV vol s, MOD A2C: 30.1 ml LV vol s, MOD A4C: 44.8 ml LV SV MOD A2C:     34.6 ml LV SV MOD A4C:     114.0 ml LV SV MOD BP:      53.0 ml RIGHT VENTRICLE             IVC RV S prime:     12.00 cm/s  IVC diam: 1.00 cm TAPSE (M-mode): 2.0 cm LEFT ATRIUM             Index        RIGHT ATRIUM           Index LA diam:        2.40 cm 1.12 cm/m   RA Area:     12.50 cm LA Vol (A2C):   40.1 ml 18.72 ml/m  RA Volume:   29.20 ml  13.63 ml/m LA Vol (A4C):   39.9 ml 18.63 ml/m LA Biplane Vol: 41.2 ml 19.24 ml/m  AORTIC VALVE LVOT Vmax:   108.00 cm/s LVOT Vmean:  69.400 cm/s LVOT VTI:    0.178 m  AORTA Ao Root diam: 3.40 cm Ao Asc diam:  3.70 cm MITRAL VALVE MV Area (PHT): 3.88 cm    SHUNTS MV Decel Time: 196 msec    Systemic VTI:  0.18 m MV E velocity: 69.40 cm/s  Systemic Diam: 2.20 cm MV A velocity: 82.00 cm/s MV E/A ratio:  0.85 Landscape architect signed by Phineas Inches Signature Date/Time: 04/10/2022/9:27:28 AM    Final    CT ANGIO HEAD NECK W WO CM  Result Date: 04/09/2022 CLINICAL DATA:  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 Multiplanar CT image reconstructions and MIPs were obtained to evaluate the vascular anatomy. Carotid stenosis measurements (when applicable) are obtained utilizing NASCET criteria, using the distal internal carotid diameter as the denominator. RADIATION DOSE REDUCTION: This exam was performed according to the departmental dose-optimization program which includes  automated exposure control, adjustment of the mA and/or kV according to patient size and/or use of iterative reconstruction technique. CONTRAST:  49m OMNIPAQUE IOHEXOL 350 MG/ML SOLN COMPARISON:  CT head 10/22/2021, MRI head 04/09/2022; no prior CTA FINDINGS: CT HEAD FINDINGS Brain: No evidence of acute infarct, hemorrhage, mass, mass effect, or midline shift. No hydrocephalus or extra-axial fluid collection. In the possible infarcts noted on the same-day MRI are not definitively seen on this exam. Vascular: No hyperdense vessel. Skull: Normal. Negative for fracture or focal lesion. Sinuses/Orbits: No acute finding. Other: The mastoid  air cells are well aerated. 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 Standard aortic branching. Right carotid system: No evidence of stenosis, dissection, or occlusion. Left carotid system: No evidence of stenosis, dissection, or occlusion. Vertebral arteries: No evidence of stenosis, dissection, or occlusion. Skeleton: No acute osseous abnormality. Other neck: The thyroid nodules noted on the same-day MRI are not as discrete on CT, possibly due to photon starvation in this area due to overlapping soft tissues. No additional acute finding in the neck. Upper chest: No focal pulmonary opacity or pleural effusion. Review of the MIP images confirms the above findings CTA HEAD FINDINGS Anterior circulation: Both internal carotid arteries are patent to the termini, without significant stenosis. A1 segments patent. Normal anterior communicating artery. Anterior cerebral arteries are patent to their distal aspects. No M1 stenosis or occlusion. MCA branches perfused and symmetric. Posterior circulation: Vertebral arteries patent to the vertebrobasilar junction without stenosis. Posterior inferior cerebellar arteries patent proximally. Basilar patent to its distal  aspect. Superior cerebellar arteries patent proximally. Patent P1 segments. PCAs perfused to their distal aspects without stenosis. The bilateral posterior communicating arteries are patent. Venous sinuses: As permitted by contrast timing, patent. Anatomic variants: None significant. Review of the MIP images confirms the above findings IMPRESSION: 1. 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 2. No acute intracranial process. The infarcts seen on the 04/09/2022 MRI are not apparent on CT. 3. No intracranial large vessel occlusion or significant stenosis. 4. No hemodynamically significant stenosis in the neck. 5. The thyroid nodules noted on the 04/09/2022 MRI of the cervical spine are not apparent on this study. Further evaluation with nonemergent ultrasound is recommended. (Reference: J Am Coll Radiol. 2015 Feb;12(2): 143-50) These results will be called to the ordering clinician or representative by the Radiologist Assistant, and communication documented in the PACS or [employer]. Electronically Signed   By: Merilyn Baba M.D.   On: 04/09/2022 23:27   DG FL GUIDED LUMBAR PUNCTURE  Result Date: 04/09/2022 CLINICAL DATA:  47 year old female with sudden onset of weakness of upper extremity, MR brain showed possible demyelinating disease. Request for diagnostic fluoro guided lumbar puncture. EXAM: DIAGNOSTIC LUMBAR PUNCTURE UNDER FLUOROSCOPIC GUIDANCE COMPARISON:  MR lumbar dated 10/03/2020, fluoro guided lumbar puncture dated 05/05/2021. FLUOROSCOPY: Radiation Exposure Index (as provided by the fluoroscopic device): 58 mGy Kerma PROCEDURE: Informed consent was obtained from the patient prior to the procedure, including risks and potential complications of bleeding, infection, damage stages in structures, low yield, headache, allergy, and pain. With the patient prone, the lower back was prepped with Betadine. 1% Lidocaine was used for local anesthesia. Two lumbar  puncture was attempted at the L3 level using a 3 inches 20 gauge gauge needle, unsuccessful. Dr. Leonia Reeves was called to the procedure room for assistance. The spinal needle was repositioned to slightly lateral at the same level with return of clear CSF. Opening pressure was not measured. 9 ml of CSF were obtained for laboratory studies. The spinal needle was removed, dressing was placed. Post procedure orders placed. The patient tolerated the procedure well and there were no apparent complications. IMPRESSION: Technically successful diagnostic fluoro guided lumbar puncture. This procedure was performed by Durenda Guthrie, PA-C under supervision of Suzy Bouchard, MD. Electronically Signed   By: Suzy Bouchard M.D.   On: 04/09/2022 18:18   MR THORACIC SPINE W WO CONTRAST  Result Date: 04/09/2022  CLINICAL DATA:  Bilateral hand pain and numbness. Rule out spontaneous CSF leak/intracranial hypotension EXAM: MRI THORACIC WITHOUT AND WITH CONTRAST TECHNIQUE: Multiplanar and multiecho pulse sequences of the thoracic spine were obtained without and with intravenous contrast. CONTRAST:  56m GADAVIST GADOBUTROL 1 MMOL/ML IV SOLN COMPARISON:  None Available. FINDINGS: Alignment:  Normal alignment.  Mild scoliosis Vertebrae: Negative for fracture or mass. No evidence of spinal infection in the thoracic spine Cord: Normal signal and morphology. Normal enhancement of the cord. Paraspinal and other soft tissues: Negative for paraspinous mass or fluid collection. Thyroid nodules are described in the cervical spine report. Disc levels: Small central disc protrusion T6-7 and T7-8 without spinal stenosis. Negative for thoracic spinal stenosis. IMPRESSION: Mild thoracic degenerative change. No evidence of spinal infection or spinal stenosis in the thoracic spine. Electronically Signed   By: CFranchot GalloM.D.   On: 04/09/2022 13:46   MR CERVICAL SPINE W WO CONTRAST  Result Date: 04/09/2022 CLINICAL DATA:  Bilateral hand pain and  numbness. Rule out intracranial hypotension/CSF leak. EXAM: MRI CERVICAL SPINE WITHOUT AND WITH CONTRAST TECHNIQUE: Multiplanar and multiecho pulse sequences of the cervical spine, to include the craniocervical junction and cervicothoracic junction, were obtained without and with intravenous contrast. CONTRAST:  197mGADAVIST GADOBUTROL 1 MMOL/ML IV SOLN COMPARISON:  MRI cervical spine 08/13/2020 FINDINGS: Alignment: Normal Vertebrae: Negative for fracture or mass. No evidence of spinal infection. Cord: Normal signal and morphology.  Normal enhancement of the cord. Posterior Fossa, vertebral arteries, paraspinal tissues: Right thyroid nodule approximately 2.5 cm. Left thyroid nodule 1.6 cm. No enlarged lymph nodes in the neck. Disc levels: C2-3: Negative C3-4: Mild disc degeneration without significant stenosis C4-5: Mild disc degeneration and spurring. Bulging of the disc without significant stenosis C5-6: Mild disc degeneration.  Negative for stenosis C6-7: Mild disc degeneration and disc bulging without stenosis C7-T1: Negative Image quality degraded by motion. IMPRESSION: 1. Mild degenerative change in the cervical spine. No significant stenosis. No evidence of spinal infection. 2. 2.5 cm right thyroid nodule. Left thyroid nodule 1.6 cm. (ref: J Am Coll Radiol. 2015 Feb;12(2): 143-50). Recommend thyroid ultrasound for further evaluation. Electronically Signed   By: ChFranchot Gallo.D.   On: 04/09/2022 13:42   MR BRAIN W WO CONTRAST  Result Date: 04/09/2022 CLINICAL DATA:  Bilateral hand pain and numbness. Rule out demyelinating disease or stroke. EXAM: MRI HEAD WITHOUT AND WITH CONTRAST TECHNIQUE: Multiplanar, multiecho pulse sequences of the brain and surrounding structures were obtained without and with intravenous contrast. CONTRAST:  10107mADAVIST GADOBUTROL 1 MMOL/ML IV SOLN COMPARISON:  CT head 10/22/2021.  MRI head 07/11/2020 FINDINGS: Brain: Multiple small areas of diffusion hyperintense signal in the  occipital parietal lobes bilaterally. Some of these lesions show mild enhancement in the parietal lobe bilaterally. Some of the lesions do not show abnormal enhancement. In addition, there are scattered white matter hyperintensities bilaterally which appear mild. Brainstem normal. 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 Enhancement is somewhat atypical however is stable no other areas of hemorrhage Ventricle size normal. Left a meningeal enhancement normal. Pituitary normal Vascular: Normal arterial flow voids Skull and upper cervical spine: No focal skeletal lesion. Sinuses/Orbits: Paranasal sinuses clear.  Negative orbit Other: None IMPRESSION: 1. Multiple small areas of diffusion hyperintense signal in the occipital parietal lobes bilaterally. Some of these lesions do mild enhancement. Appearance is nonspecific but could  be seen with subacute infarct. Demyelinating disease a consideration but not typical. Also consider inflammatory process such as sarcoid, vasculitis, lupus and PRES. Recommend lumbar puncture for further evaluation. 2. 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 Enhancement is somewhat atypical however is stable. No other areas of hemorrhage. 3. Mild white matter changes in the cerebral hemispheres bilaterally. This may be due to chronic microvascular ischemia. Demyelinating disease not excluded. Correlate with risk factors for small vessel disease. Electronically Signed   By: Franchot Gallo M.D.   On: 04/09/2022 13:38       LOS: 0 days   [facility] Pager on [email or web address]  04/10/2022, 10:02 AM

## 2022-04-10 NOTE — Progress Notes (Signed)
IV team placed a 2nd PIV for patient to have bubble study since heparin is running in other IV.  Vascular has since advised pt will not have study until tomorrow morning.  Patient wanted IV out because she cannot sleep with it in.  I advised we would have to place another IV in the morning for study if the heparin was still running.  I also explained why it was good to have a second IV during her stay and she still refused to keep it in and requested I remove it.  I will let night shift RN know that patient will need 2nd IV inserted in the morning if heparin drip is still running.

## 2022-04-11 ENCOUNTER — Inpatient Hospital Stay (HOSPITAL_COMMUNITY): Payer: BLUE CROSS/BLUE SHIELD

## 2022-04-11 DIAGNOSIS — I639 Cerebral infarction, unspecified: Secondary | ICD-10-CM | POA: Diagnosis not present

## 2022-04-11 DIAGNOSIS — E041 Nontoxic single thyroid nodule: Secondary | ICD-10-CM

## 2022-04-11 DIAGNOSIS — I749 Embolism and thrombosis of unspecified artery: Secondary | ICD-10-CM | POA: Diagnosis not present

## 2022-04-11 DIAGNOSIS — I742 Embolism and thrombosis of arteries of the upper extremities: Secondary | ICD-10-CM | POA: Diagnosis not present

## 2022-04-11 LAB — CBC
HCT: 34.6 % — ABNORMAL LOW (ref 36.0–46.0)
Hemoglobin: 10.8 g/dL — ABNORMAL LOW (ref 12.0–15.0)
MCH: 22.6 pg — ABNORMAL LOW (ref 26.0–34.0)
MCHC: 31.2 g/dL (ref 30.0–36.0)
MCV: 72.5 fL — ABNORMAL LOW (ref 80.0–100.0)
Platelets: 435 10*3/uL — ABNORMAL HIGH (ref 150–400)
RBC: 4.77 MIL/uL (ref 3.87–5.11)
RDW: 16.7 % — ABNORMAL HIGH (ref 11.5–15.5)
WBC: 7.7 10*3/uL (ref 4.0–10.5)
nRBC: 0 % (ref 0.0–0.2)

## 2022-04-11 LAB — BASIC METABOLIC PANEL
Anion gap: 13 (ref 5–15)
BUN: 7 mg/dL (ref 6–20)
CO2: 19 mmol/L — ABNORMAL LOW (ref 22–32)
Calcium: 8.5 mg/dL — ABNORMAL LOW (ref 8.9–10.3)
Chloride: 103 mmol/L (ref 98–111)
Creatinine, Ser: 0.78 mg/dL (ref 0.44–1.00)
GFR, Estimated: >60 mL/min (ref >60)
Glucose, Bld: 109 mg/dL — ABNORMAL HIGH (ref 70–99)
Potassium: 3.4 mmol/L — ABNORMAL LOW (ref 3.5–5.1)
Sodium: 135 mmol/L (ref 135–145)

## 2022-04-11 LAB — MAGNESIUM: Magnesium: 1.8 mg/dL (ref 1.7–2.4)

## 2022-04-11 LAB — PROTEIN S ACTIVITY: Protein S Activity: 66 % (ref 63–140)

## 2022-04-11 LAB — PROTEIN S, TOTAL: Protein S Ag, Total: 98 % (ref 60–150)

## 2022-04-11 LAB — HEPARIN LEVEL (UNFRACTIONATED)
Heparin Unfractionated: 0.14 IU/mL — ABNORMAL LOW (ref 0.30–0.70)
Heparin Unfractionated: 0.2 IU/mL — ABNORMAL LOW (ref 0.30–0.70)
Heparin Unfractionated: 0.24 IU/mL — ABNORMAL LOW (ref 0.30–0.70)

## 2022-04-11 LAB — PROTEIN C ACTIVITY: Protein C Activity: 129 % (ref 73–180)

## 2022-04-11 MED ORDER — POTASSIUM CHLORIDE CRYS ER 20 MEQ PO TBCR
40.0000 meq | EXTENDED_RELEASE_TABLET | Freq: Once | ORAL | Status: AC
  Administered 2022-04-11: 40 meq via ORAL
  Filled 2022-04-11: qty 2

## 2022-04-11 NOTE — Progress Notes (Addendum)
[facility] PROGRESS NOTE   Connie Herrera FIE:[id number] DOB: 1975-06-02 DOA: 04/09/2022  PCP: Camillia Herter, NP  Brief History/Interval Summary: 47 y.o. female with a past medical history of essential hypertension, previous history of transient numbness and weakness which have resolved spontaneously.  She has been seen by neurology previously.  Underwent lumbar puncture in January 2023.  She was in her usual state of health till this morning when she went to see her primary care provider.  This was for a routine visit.  She came back to her house and then noticed that her left arm was weak.  She had similar symptoms about a week ago but thought that weakness resolved spontaneously. In the emergency department she underwent MRI brain which showed multiple small areas of diffusion hyperintense signal in the occipital parietal lobes bilaterally.  Chronic changes were also noted.  Etiology for these findings were not clear.  Neurology was consulted.    Consultants: Neurology.  Vascular surgery  Procedures: Lumbar puncture    Subjective/Interval History: Patient mentions that the left arm feels better but not back to baseline.  Patient is upset by all of her different diagnoses.  Denies any chest pain shortness of breath.  Reports some anxiety.    Assessment/Plan:  Acute stroke Presented with left arm weakness.  Per neurology embolic etiology suspected.   There was also concern for demyelinating process.  Patient underwent lumbar puncture.  Underwent MRI brain cervical spine and thoracic spine. Neurology is following. LDL noted to be 68.  HbA1c is 6.0. TSH 2.4.  RPR nonreactive.  HIV nonreactive Echocardiogram shows normal systolic function with grade 1 diastolic dysfunction. No needs identified by physical and Occupational Therapy.  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  Started on IV heparin. Lower extremity Doppler studies  pending.  Hypercoagulable workup initiated. Vascular surgery consulted.  Plan is to repeat CT angiogram in 48 hours which will be tomorrow.  Further decisions to be made after the results of that test is available.  Essential hypertension Holding her medications for now.  Permissive hypertension.  Hypokalemia Was repleted yesterday.  Labs are pending from today.  Hyponatremia Continue to monitor.  Microcytic anemia/iron deficiency This is as a result of menorrhagia.   Anemia panel shows ferritin of 5, iron of 26, TIBC 533, percent saturation 5.  Vitamin B12 level is 166, folic acid 06.3. Hemoglobin is stable.  Due to all of her acute issues we are holding off on iron infusion for now.  Consider prior to discharge.  Left thyroid nodule Imaging studies suggested nodules in both lobes.  Ultrasound was done which revealed a nodule of concern in the left thyroid lobe.  They recommend a biopsy.  TSH is normal. This was communicated to the patient.  We will wait for her acute issues to subside.  Biopsy will need to be done in the outpatient setting in the next few weeks.    Menorrhagia/uterine fibroids Has not followed up with gynecology in more than a year.  She was told that she would need to do so as soon as possible now that she is on anticoagulants.  This might worsen her menorrhagia.  Obesity Estimated body mass index is 37.93 kg/m as calculated from the following:   Height as of this encounter: '5\' 6"'$  (1.676 m).   Weight as of this encounter: 106.6 kg.   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     Medications: Scheduled:  aspirin EC  81 mg Oral Daily   Continuous:  heparin 1,350 Units/hr (04/11/22 0046)   QIH:[id number] **OR** acetaminophen (TYLENOL)  oral liquid 160 mg/5 mL **OR** acetaminophen, gadobutrol, oxyCODONE  Antibiotics: Anti-infectives (From admission, onward)    None       Objective:  Vital Signs  Vitals:   04/10/22 1632 04/10/22 1954 04/10/22 2307 04/11/22 0311  BP: (!) 155/94 (!) 166/91 133/85 (!) 136/92  Pulse: 87 93 92 88  Resp: '20 15 20 12  '$ Temp: 98 F (36.7 C) 98.3 F (36.8 C) 98.2 F (36.8 C) 98.5 F (36.9 C)  TempSrc: Oral Oral Oral Oral  SpO2: 97% 96% 97% 96%  Weight:      Height:        Intake/Output Summary (Last 24 hours) at 04/11/2022 0939 Last data filed at 04/11/2022 0600 Gross per 24 hour  Intake 319.03 ml  Output --  Net 319.03 ml   Filed Weights   04/09/22 2023  Weight: 106.6 kg    General appearance: Awake alert.  In no distress Resp: Clear to auscultation bilaterally.  Normal effort Cardio: S1-S2 is normal regular.  No S3-S4.  No rubs murmurs or bruit GI: Abdomen is soft.  Nontender nondistended.  Bowel sounds are present normal.  No masses organomegaly Extremities: No edema.  Subtle left arm weakness noted. She is alert and oriented x 3   Lab Results:  Data Reviewed: I have personally reviewed following labs and reports of the imaging studies  CBC: Recent Labs  Lab 04/09/22 1315 04/09/22 1322 04/10/22 0443 04/11/22 0828  WBC 7.2  --  8.2 7.7  NEUTROABS 5.3  --   --   --   HGB 10.1* 11.2* 10.5* 10.8*  HCT 34.5* 33.0* 33.9* 34.6*  MCV 76.2*  --  73.1* 72.5*  PLT 424*  --  441* 435*     Basic Metabolic Panel: Recent Labs  Lab 04/09/22 1315 04/09/22 1322 04/10/22 0443  NA 137 139 133*  K 3.2* 3.2* 3.3*  CL 105 103 101  CO2 23  --  24  GLUCOSE 100* 97 101*  BUN 6 5* 5*  CREATININE 0.69 0.50 0.68  CALCIUM 8.3*  --  8.7*  MG  --   --  1.8     GFR: Estimated Creatinine Clearance: 108.5 mL/min (by C-G formula based on SCr of 0.68 mg/dL).  Liver Function Tests: Recent Labs  Lab 04/09/22 1315 04/10/22 0443  AST 20 22  ALT 24 24  ALKPHOS 76 77   BILITOT <0.1* 0.4  PROT 6.4* 6.8  ALBUMIN 3.4* 3.3*      Coagulation Profile: Recent Labs  Lab 04/09/22 1315  INR 1.0      CBG: Recent Labs  Lab 04/09/22 1038  GLUCAP 101*     Lipid Profile: Recent Labs    04/10/22 0443  CHOL 138  HDL 36*  LDLCALC 68  TRIG 171*  CHOLHDL 3.8     Thyroid Function Tests: Recent Labs    04/10/22 0443  TSH 2.461     Anemia Panel: Recent Labs    04/10/22 0443  VITAMINB12 329  FOLATE 11.2  FERRITIN 5*  TIBC 533*  IRON 26*  RETICCTPCT 1.7     Recent Results (from the past 240 hour(s))  CSF culture w Gram Stain  Status: None (Preliminary result)   Collection Time: 04/09/22  5:08 PM   Specimen: PATH Cytology CSF; Cerebrospinal Fluid  Result Value Ref Range Status   Specimen Description CSF  Final   Special Requests NONE  Final   Gram Stain NO WBC SEEN NO ORGANISMS SEEN CYTOSPIN SMEAR   Final   Culture   Final    NO GROWTH < 12 HOURS Performed at [facility], [address], [place], [place] [address]    Report Status PENDING  Incomplete  Culture, fungus without smear     Status: None (Preliminary result)   Collection Time: 04/09/22  5:08 PM   Specimen: PATH Cytology CSF; Cerebrospinal Fluid  Result Value Ref Range Status   Specimen Description CSF  Final   Special Requests NONE  Final   Culture   Final    NO FUNGUS ISOLATED AFTER 1 DAY Performed at [facility], [place] [address], [place], [place] [address]    Report Status PENDING  Incomplete      Radiology Studies: US THYROID  Result Date: 04/10/2022 CLINICAL DATA:  Multiple thyroid nodules EXAM: THYROID ULTRASOUND TECHNIQUE: Ultrasound examination of the thyroid gland and adjacent soft tissues was performed. COMPARISON:  MRI cervical spine 04/09/2022 FINDINGS: Parenchymal Echotexture: Mildly heterogenous Isthmus: 0.8 cm Right lobe: 5.5 x 2.2 x 2.6 cm Left lobe: 6.2 x 2.3 x 2.4 cm _________________________________________________________  Estimated total number of nodules >/= 1 cm: 1 Number of spongiform nodules >/=  2 cm not described below (TR1): 0 Number of mixed cystic and solid nodules >/= 1.5 cm not described below ([place]): 0 _________________________________________________________ Nodule # 1: Location: Left; inferior Maximum size: 2.8 cm; Other 2 dimensions: 1.8 x 1.6 cm Composition: solid/almost completely solid (2) Echogenicity: hypoechoic (2) Shape: not taller-than-wide (0) Margins: smooth (0) Echogenic foci: none (0) ACR TI-RADS total points: 4. ACR TI-RADS risk category: TR4 (4-6 points). ACR TI-RADS recommendations: **Given size (>/= 1.5 cm) and appearance, fine needle aspiration of this moderately suspicious nodule should be considered based on TI-RADS criteria. This nodule corresponds to the left thyroid abnormality seen on recent MRI. No discrete abnormality identified in the right thyroid lobe. _________________________________________________________ IMPRESSION: Nodule 1 (TI-RADS 4) located in the inferior left thyroid lobe measuring 2.8 x 1.8 x 1.6 cm, meets criteria for FNA. The above is in keeping with the ACR TI-RADS recommendations - J Am Coll Radiol 2017;14:587-595. Electronically Signed   By: Miachel Roux M.D.   On: 04/10/2022 15:45   ECHOCARDIOGRAM COMPLETE BUBBLE STUDY  Result Date: 04/10/2022    ECHOCARDIOGRAM REPORT   Patient Name:   ESHAL PROPPS Date of Exam: 04/10/2022 Medical Rec #:  [id number]            Height:       66.0 in Accession #:    [id number]           Weight:       235.0 lb Date of Birth:  [birth year]           BSA:          2.142 m Patient Age:    61 years             BP:           147/88 mmHg Patient Gender: F                    HR:           84 bpm.  Exam Location:  Inpatient Procedure: 2D Echo, Cardiac Doppler, Color Doppler and Saline Contrast Bubble            Study Indications:    Stroke  History:        Patient has no prior history of Echocardiogram examinations. No                 prior  cardiac history.  Sonographer:    Roseanna Rainbow RDCS Referring Phys: [id number] [place]  Sonographer Comments: Suboptimal parasternal window. Patient was very sensitive to pressure from probe IMPRESSIONS  1. Left ventricular ejection fraction, by estimation, is 55 to 60%. The left ventricle has normal function. The left ventricle has no regional wall motion abnormalities. There is mild concentric left ventricular hypertrophy. Left ventricular diastolic parameters are consistent with Grade I diastolic dysfunction (impaired relaxation).  2. Right ventricular systolic function is normal. The right ventricular size is normal. Tricuspid regurgitation signal is inadequate for assessing PA pressure.  3. No evidence of mitral valve regurgitation.  4. The aortic valve was not well visualized. Aortic valve regurgitation is not visualized.  5. The inferior vena cava is normal in size with greater than 50% respiratory variability, suggesting right atrial pressure of 3 mmHg. Comparison(s): No prior Echocardiogram. FINDINGS  Left Ventricle: Left ventricular ejection fraction, by estimation, is 55 to 60%. The left ventricle has normal function. The left ventricle has no regional wall motion abnormalities. The left ventricular internal cavity size was normal in size. There is  mild concentric left ventricular hypertrophy. Left ventricular diastolic parameters are consistent with Grade I diastolic dysfunction (impaired relaxation). Right Ventricle: The right ventricular size is normal. Right ventricular systolic function is normal. Tricuspid regurgitation signal is inadequate for assessing PA pressure. Left Atrium: Left atrial size was normal in size. Right Atrium: Right atrial size was normal in size. Pericardium: There is no evidence of pericardial effusion. Mitral Valve: No evidence of mitral valve regurgitation. Tricuspid Valve: Tricuspid valve regurgitation is not demonstrated. Aortic Valve: The aortic valve was not well  visualized. Aortic valve regurgitation is not visualized. Pulmonic Valve: Pulmonic valve regurgitation is not visualized. Aorta: The aortic root and ascending aorta are structurally normal, with no evidence of dilitation. Venous: The inferior vena cava is normal in size with greater than 50% respiratory variability, suggesting right atrial pressure of 3 mmHg. IAS/Shunts: No atrial level shunt detected by color flow Doppler. Agitated saline contrast was given intravenously to evaluate for intracardiac shunting.  LEFT VENTRICLE PLAX 2D LVIDd:         5.00 cm      Diastology LVIDs:         3.50 cm      LV e' medial:    6.09 cm/s LV PW:         1.30 cm      LV E/e' medial:  11.4 LV IVS:        1.10 cm      LV e' lateral:   9.79 cm/s LVOT diam:     2.20 cm      LV E/e' lateral: 7.1 LV SV:         68 LV SV Index:   32 LVOT Area:     3.80 cm  LV Volumes (MOD) LV vol d, MOD A2C: 64.7 ml LV vol d, MOD A4C: 114.0 ml LV vol s, MOD A2C: 30.1 ml LV vol s, MOD A4C: 44.8 ml LV SV MOD A2C:     34.6  ml LV SV MOD A4C:     114.0 ml LV SV MOD BP:      53.0 ml RIGHT VENTRICLE             IVC RV S prime:     12.00 cm/s  IVC diam: 1.00 cm TAPSE (M-mode): 2.0 cm LEFT ATRIUM             Index        RIGHT ATRIUM           Index LA diam:        2.40 cm 1.12 cm/m   RA Area:     12.50 cm LA Vol (A2C):   40.1 ml 18.72 ml/m  RA Volume:   29.20 ml  13.63 ml/m LA Vol (A4C):   39.9 ml 18.63 ml/m LA Biplane Vol: 41.2 ml 19.24 ml/m  AORTIC VALVE LVOT Vmax:   108.00 cm/s LVOT Vmean:  69.400 cm/s LVOT VTI:    0.178 m  AORTA Ao Root diam: 3.40 cm Ao Asc diam:  3.70 cm MITRAL VALVE MV Area (PHT): 3.88 cm    SHUNTS MV Decel Time: 196 msec    Systemic VTI:  0.18 m MV E velocity: 69.40 cm/s  Systemic Diam: 2.20 cm MV A velocity: 82.00 cm/s MV E/A ratio:  0.85 Landscape architect signed by Phineas Inches Signature Date/Time: 04/10/2022/9:27:28 AM    Final    CT ANGIO HEAD NECK W WO CM  Result Date: 04/09/2022 CLINICAL DATA:  Subacute infarcts in  the bilateral parietal and occipital lobes on same-day MRI; bilateral hand pain and numbness EXAM: CT ANGIOGRAPHY HEAD AND NECK TECHNIQUE: Multidetector CT imaging of the head and neck was performed using the standard protocol during bolus administration of intravenous contrast. Multiplanar CT image reconstructions and MIPs were obtained to evaluate the vascular anatomy. Carotid stenosis measurements (when applicable) are obtained utilizing NASCET criteria, using the distal internal carotid diameter as the denominator. RADIATION DOSE REDUCTION: This exam was performed according to the departmental dose-optimization program which includes automated exposure control, adjustment of the mA and/or kV according to patient size and/or use of iterative reconstruction technique. CONTRAST:  64m OMNIPAQUE IOHEXOL 350 MG/ML SOLN COMPARISON:  CT head 10/22/2021, MRI head 04/09/2022; no prior CTA FINDINGS: CT HEAD FINDINGS Brain: No evidence of acute infarct, hemorrhage, mass, mass effect, or midline shift. No hydrocephalus or extra-axial fluid collection. In the possible infarcts noted on the same-day MRI are not definitively seen on this exam. Vascular: No hyperdense vessel. Skull: Normal. Negative for fracture or focal lesion. Sinuses/Orbits: No acute finding. Other: The mastoid air cells are well aerated. 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 Standard aortic branching. Right carotid system: No evidence of stenosis, dissection, or occlusion. Left carotid system: No evidence of stenosis, dissection, or occlusion. Vertebral arteries: No evidence of stenosis, dissection, or occlusion. Skeleton: No acute osseous abnormality. Other neck: The thyroid nodules noted on the same-day MRI are not as discrete on CT, possibly due to photon starvation in this area due to overlapping soft tissues. No  additional acute finding in the neck. Upper chest: No focal pulmonary opacity or pleural effusion. Review of the MIP images confirms the above findings CTA HEAD FINDINGS Anterior circulation: Both internal carotid arteries are patent to the termini, without significant stenosis. A1 segments patent. Normal anterior communicating artery. Anterior cerebral arteries are patent to their distal  aspects. No M1 stenosis or occlusion. MCA branches perfused and symmetric. Posterior circulation: Vertebral arteries patent to the vertebrobasilar junction without stenosis. Posterior inferior cerebellar arteries patent proximally. Basilar patent to its distal aspect. Superior cerebellar arteries patent proximally. Patent P1 segments. PCAs perfused to their distal aspects without stenosis. The bilateral posterior communicating arteries are patent. Venous sinuses: As permitted by contrast timing, patent. Anatomic variants: None significant. Review of the MIP images confirms the above findings IMPRESSION: 1. 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 2. No acute intracranial process. The infarcts seen on the 04/09/2022 MRI are not apparent on CT. 3. No intracranial large vessel occlusion or significant stenosis. 4. No hemodynamically significant stenosis in the neck. 5. The thyroid nodules noted on the 04/09/2022 MRI of the cervical spine are not apparent on this study. Further evaluation with nonemergent ultrasound is recommended. (Reference: J Am Coll Radiol. 2015 Feb;12(2): 143-50) These results will be called to the ordering clinician or representative by the Radiologist Assistant, and communication documented in the PACS or [employer]. Electronically Signed   By: Merilyn Baba M.D.   On: 04/09/2022 23:27   DG FL GUIDED LUMBAR PUNCTURE  Result Date: 04/09/2022 CLINICAL DATA:  47 year old female with sudden onset of weakness of upper extremity, MR brain showed possible  demyelinating disease. Request for diagnostic fluoro guided lumbar puncture. EXAM: DIAGNOSTIC LUMBAR PUNCTURE UNDER FLUOROSCOPIC GUIDANCE COMPARISON:  MR lumbar dated 10/03/2020, fluoro guided lumbar puncture dated 05/05/2021. FLUOROSCOPY: Radiation Exposure Index (as provided by the fluoroscopic device): 58 mGy Kerma PROCEDURE: Informed consent was obtained from the patient prior to the procedure, including risks and potential complications of bleeding, infection, damage stages in structures, low yield, headache, allergy, and pain. With the patient prone, the lower back was prepped with Betadine. 1% Lidocaine was used for local anesthesia. Two lumbar puncture was attempted at the L3 level using a 3 inches 20 gauge gauge needle, unsuccessful. Dr. Leonia Reeves was called to the procedure room for assistance. The spinal needle was repositioned to slightly lateral at the same level with return of clear CSF. Opening pressure was not measured. 9 ml of CSF were obtained for laboratory studies. The spinal needle was removed, dressing was placed. Post procedure orders placed. The patient tolerated the procedure well and there were no apparent complications. IMPRESSION: Technically successful diagnostic fluoro guided lumbar puncture. This procedure was performed by Durenda Guthrie, PA-C under supervision of Suzy Bouchard, MD. Electronically Signed   By: Suzy Bouchard M.D.   On: 04/09/2022 18:18   MR THORACIC SPINE W WO CONTRAST  Result Date: 04/09/2022 CLINICAL DATA:  Bilateral hand pain and numbness. Rule out spontaneous CSF leak/intracranial hypotension EXAM: MRI THORACIC WITHOUT AND WITH CONTRAST TECHNIQUE: Multiplanar and multiecho pulse sequences of the thoracic spine were obtained without and with intravenous contrast. CONTRAST:  41m GADAVIST GADOBUTROL 1 MMOL/ML IV SOLN COMPARISON:  None Available. FINDINGS: Alignment:  Normal alignment.  Mild scoliosis Vertebrae: Negative for fracture or mass. No evidence of spinal  infection in the thoracic spine Cord: Normal signal and morphology. Normal enhancement of the cord. Paraspinal and other soft tissues: Negative for paraspinous mass or fluid collection. Thyroid nodules are described in the cervical spine report. Disc levels: Small central disc protrusion T6-7 and T7-8 without spinal stenosis. Negative for thoracic spinal stenosis. IMPRESSION: Mild thoracic degenerative change. No evidence of spinal infection or spinal stenosis in the thoracic spine. Electronically Signed   By: CFranchot GalloM.D.   On: 04/09/2022 13:46  MR CERVICAL SPINE W WO CONTRAST  Result Date: 04/09/2022 CLINICAL DATA:  Bilateral hand pain and numbness. Rule out intracranial hypotension/CSF leak. EXAM: MRI CERVICAL SPINE WITHOUT AND WITH CONTRAST TECHNIQUE: Multiplanar and multiecho pulse sequences of the cervical spine, to include the craniocervical junction and cervicothoracic junction, were obtained without and with intravenous contrast. CONTRAST:  28m GADAVIST GADOBUTROL 1 MMOL/ML IV SOLN COMPARISON:  MRI cervical spine 08/13/2020 FINDINGS: Alignment: Normal Vertebrae: Negative for fracture or mass. No evidence of spinal infection. Cord: Normal signal and morphology.  Normal enhancement of the cord. Posterior Fossa, vertebral arteries, paraspinal tissues: Right thyroid nodule approximately 2.5 cm. Left thyroid nodule 1.6 cm. No enlarged lymph nodes in the neck. Disc levels: C2-3: Negative C3-4: Mild disc degeneration without significant stenosis C4-5: Mild disc degeneration and spurring. Bulging of the disc without significant stenosis C5-6: Mild disc degeneration.  Negative for stenosis C6-7: Mild disc degeneration and disc bulging without stenosis C7-T1: Negative Image quality degraded by motion. IMPRESSION: 1. Mild degenerative change in the cervical spine. No significant stenosis. No evidence of spinal infection. 2. 2.5 cm right thyroid nodule. Left thyroid nodule 1.6 cm. (ref: J Am Coll Radiol.  2015 Feb;12(2): 143-50). Recommend thyroid ultrasound for further evaluation. Electronically Signed   By: CFranchot GalloM.D.   On: 04/09/2022 13:42   MR BRAIN W WO CONTRAST  Result Date: 04/09/2022 CLINICAL DATA:  Bilateral hand pain and numbness. Rule out demyelinating disease or stroke. EXAM: MRI HEAD WITHOUT AND WITH CONTRAST TECHNIQUE: Multiplanar, multiecho pulse sequences of the brain and surrounding structures were obtained without and with intravenous contrast. CONTRAST:  166mGADAVIST GADOBUTROL 1 MMOL/ML IV SOLN COMPARISON:  CT head 10/22/2021.  MRI head 07/11/2020 FINDINGS: Brain: Multiple small areas of diffusion hyperintense signal in the occipital parietal lobes bilaterally. Some of these lesions show mild enhancement in the parietal lobe bilaterally. Some of the lesions do not show abnormal enhancement. In addition, there are scattered white matter hyperintensities bilaterally which appear mild. Brainstem normal. 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 Enhancement is somewhat atypical however is stable no other areas of hemorrhage Ventricle size normal. Left a meningeal enhancement normal. Pituitary normal Vascular: Normal arterial flow voids Skull and upper cervical spine: No focal skeletal lesion. Sinuses/Orbits: Paranasal sinuses clear.  Negative orbit Other: None IMPRESSION: 1. Multiple small areas of diffusion hyperintense signal in the occipital parietal lobes bilaterally. Some of these lesions do mild enhancement. Appearance is nonspecific but could be seen with subacute infarct. Demyelinating disease a consideration but not typical. Also consider inflammatory process such as sarcoid, vasculitis, lupus and PRES. Recommend lumbar puncture for further evaluation. 2. 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 Enhancement is somewhat atypical however is stable. No other areas of hemorrhage. 3. Mild white matter changes in the cerebral hemispheres bilaterally. This may be due to chronic microvascular ischemia. Demyelinating disease not excluded. Correlate with risk factors for small vessel disease. Electronically Signed   By: ChFranchot Gallo.D.   On: 04/09/2022 13:38       LOS: 1 day   [facility] Pager on [email or web address]  04/11/2022, 9:39 AM

## 2022-04-11 NOTE — Progress Notes (Signed)
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  No Known Allergies  Patient Measurements: Height: '5\' 6"'$  (167.6 cm) Weight: 106.6 kg (235 lb 0.2 oz) IBW/kg (Calculated) : 59.3 Heparin Dosing Weight: 84 kg  Vital Signs:    Labs: Recent Labs    04/09/22 1315 04/09/22 1322 04/10/22 0443 04/10/22 1509 04/10/22 2305 04/11/22 0828 04/11/22 1625  HGB 10.1* 11.2* 10.5*  --   --  10.8*  --   HCT 34.5* 33.0* 33.9*  --   --  34.6*  --   PLT 424*  --  441*  --   --  435*  --   APTT 28  --   --   --   --   --   --   LABPROT 13.2  --   --   --   --   --   --   INR 1.0  --   --   --   --   --   --   HEPARINUNFRC  --   --   --    < > 0.14* 0.20* 0.24*  CREATININE 0.69 0.50 0.68  --   --  0.78  --    < > = values in this interval not displayed.     Estimated Creatinine Clearance: 108.5 mL/min (by C-G formula based on SCr of 0.78 mg/dL).   Medical History: Past Medical History:  Diagnosis Date   Brain lesion    COVID-19 04/2020   Diplopia    Hypertension    Iron deficiency anemia      Assessment:  56 yoF presenting with recurrent transient numbness and weakness. 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 Neurology has consulted pharmacy to dose heparin with conservative goal and no bolus. Patient was not on anticoagulation prior to admission.   Heparin level remains subtherapeutic 0.20 on 1350 units/hr; Hgb 10.8 and stable, plts 435  Heparin level came back subtherapeutic again this PM. We will increase rate again and check another level.   Goal of Therapy:  Heparin level 0.3-0.5 units/ml (NO Bolus) Monitor platelets by anticoagulation protocol: Yes   Plan:  Increase heparin infusion to 1700 units/hr Check heparin level in 6 hours until therapeutic x2 Monitor daily CBC, heparin level, and for s/sx of bleeding. F/u bilateral LE  doppler studies  Onnie Boer, PharmD, BCIDP, AAHIVP, CPP Infectious Disease Pharmacist 04/11/2022 5:38 PM

## 2022-04-11 NOTE — Progress Notes (Signed)
  Daily Progress Note  Incidental brachiocephalic artery thrombus  Subjective: Doing well this morning. Denies sensory or motor deficits   Objective: Vitals:   04/10/22 2307 04/11/22 0311  BP: 133/85 (!) 136/92  Pulse: 92 88  Resp: 20 12  Temp: 98.2 F (36.8 C) 98.5 F (36.9 C)  SpO2: 97% 96%    Physical Examination Palpable pulses in all extremities, normal strength all extremities Nonlabored breathing Regular rate   ASSESSMENT/PLAN:  63-year female who presents with signs and symptoms concerning for demyelinating process.  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  Currently being heparinized as intervention risk carries a high risk of stroke. Plan for reimaging at the 48-hour mark in an effort to assess regression.  Possible surgical need pending new imaging Sunday. Surgery timing early next week if necessary.   Cassandria Santee MD [facility] [phone] 04/11/2022  8:31 AM

## 2022-04-11 NOTE — Progress Notes (Addendum)
STROKE TEAM PROGRESS NOTE   INTERVAL HISTORY She is on the phone with family, laying in bed watching TV comfortably.  No significant events overnight per RN report.  She notes that a few days ago she could barely move her R arm, but today she has full use.  Able to do fine dexterity on her cell phone like normal.  Polysomnography completed overnight per pt report.  Can review for possible OSA risk factor.  Vitals:   04/10/22 1632 04/10/22 1954 04/10/22 2307 04/11/22 0311  BP: (!) 155/94 (!) 166/91 133/85 (!) 136/92  Pulse: 87 93 92 88  Resp: '20 15 20 12  '$ Temp: 98 F (36.7 C) 98.3 F (36.8 C) 98.2 F (36.8 C) 98.5 F (36.9 C)  TempSrc: Oral Oral Oral Oral  SpO2: 97% 96% 97% 96%  Weight:      Height:       CBC:  Recent Labs  Lab 04/09/22 1315 04/09/22 1322 04/10/22 0443 04/11/22 0828  WBC 7.2  --  8.2 7.7  NEUTROABS 5.3  --   --   --   HGB 10.1*   < > 10.5* 10.8*  HCT 34.5*   < > 33.9* 34.6*  MCV 76.2*  --  73.1* 72.5*  PLT 424*  --  441* 435*   < > = values in this interval not displayed.   Basic Metabolic Panel:  Recent Labs  Lab 04/10/22 0443 04/11/22 0828  NA 133* 135  K 3.3* 3.4*  CL 101 103  CO2 24 19*  GLUCOSE 101* 109*  BUN 5* 7  CREATININE 0.68 0.78  CALCIUM 8.7* 8.5*  MG 1.8 1.8   Lipid Panel:  Recent Labs  Lab 04/10/22 0443  CHOL 138  TRIG 171*  HDL 36*  CHOLHDL 3.8  VLDL 34  LDLCALC 68   HgbA1c:  Recent Labs  Lab 04/10/22 0443  HGBA1C 6.0*   Urine Drug Screen:  Recent Labs  Lab 04/09/22 2021  LABOPIA NONE DETECTED  COCAINSCRNUR NONE DETECTED  LABBENZ POSITIVE*  AMPHETMU NONE DETECTED  THCU POSITIVE*  LABBARB NONE DETECTED    Alcohol Level  Recent Labs  Lab 04/09/22 1315  ETH <10   ________________________________________________________________  IMAGING past 48hrs  US THYROID  Result Date: 04/10/2022 CLINICAL DATA:  Multiple thyroid nodules EXAM: THYROID ULTRASOUND TECHNIQUE: Ultrasound examination of the thyroid  gland and adjacent soft tissues was performed. COMPARISON:  MRI cervical spine 04/09/2022 FINDINGS: Parenchymal Echotexture: Mildly heterogenous Isthmus: 0.8 cm Right lobe: 5.5 x 2.2 x 2.6 cm Left lobe: 6.2 x 2.3 x 2.4 cm _________________________________________________________ Estimated total number of nodules >/= 1 cm: 1 Number of spongiform nodules >/=  2 cm not described below (TR1): 0 Number of mixed cystic and solid nodules >/= 1.5 cm not described below ([place]): 0 _________________________________________________________ Nodule # 1: Location: Left; inferior Maximum size: 2.8 cm; Other 2 dimensions: 1.8 x 1.6 cm Composition: solid/almost completely solid (2) Echogenicity: hypoechoic (2) Shape: not taller-than-wide (0) Margins: smooth (0) Echogenic foci: none (0) ACR TI-RADS total points: 4. ACR TI-RADS risk category: TR4 (4-6 points). ACR TI-RADS recommendations: **Given size (>/= 1.5 cm) and appearance, fine needle aspiration of this moderately suspicious nodule should be considered based on TI-RADS criteria. This nodule corresponds to the left thyroid abnormality seen on recent MRI. No discrete abnormality identified in the right thyroid lobe. _________________________________________________________ IMPRESSION: Nodule 1 (TI-RADS 4) located in the inferior left thyroid lobe measuring 2.8 x 1.8 x 1.6 cm, meets criteria for FNA. The above  is in keeping with the ACR TI-RADS recommendations - J Am Coll Radiol 2017;14:587-595. Electronically Signed   By: Miachel Roux M.D.   On: 04/10/2022 15:45   ECHOCARDIOGRAM COMPLETE BUBBLE STUDY  Result Date: 04/10/2022    ECHOCARDIOGRAM REPORT   Patient Name:   Connie Herrera Date of Exam: 04/10/2022 Medical Rec #:  [id number]            Height:       66.0 in Accession #:    [id number]           Weight:       235.0 lb Date of Birth:  1976/02/06           BSA:          2.142 m Patient Age:    47 years             BP:           147/88 mmHg Patient Gender: F                     HR:           84 bpm. Exam Location:  Inpatient Procedure: 2D Echo, Cardiac Doppler, Color Doppler and [place] Contrast Bubble            Study Indications:    Stroke  History:        Patient has no prior history of Echocardiogram examinations. No                 prior cardiac history.  Sonographer:    Roseanna Rainbow RDCS Referring Phys: [id number] [place]  Sonographer Comments: Suboptimal parasternal window. Patient was very sensitive to pressure from probe IMPRESSIONS  1. Left ventricular ejection fraction, by estimation, is 55 to 60%. The left ventricle has normal function. The left ventricle has no regional wall motion abnormalities. There is mild concentric left ventricular hypertrophy. Left ventricular diastolic parameters are consistent with Grade I diastolic dysfunction (impaired relaxation).  2. Right ventricular systolic function is normal. The right ventricular size is normal. Tricuspid regurgitation signal is inadequate for assessing PA pressure.  3. No evidence of mitral valve regurgitation.  4. The aortic valve was not well visualized. Aortic valve regurgitation is not visualized.  5. The inferior vena cava is normal in size with greater than 50% respiratory variability, suggesting right atrial pressure of 3 mmHg. Comparison(s): No prior Echocardiogram. FINDINGS  Left Ventricle: Left ventricular ejection fraction, by estimation, is 55 to 60%. The left ventricle has normal function. The left ventricle has no regional wall motion abnormalities. The left ventricular internal cavity size was normal in size. There is  mild concentric left ventricular hypertrophy. Left ventricular diastolic parameters are consistent with Grade I diastolic dysfunction (impaired relaxation). Right Ventricle: The right ventricular size is normal. Right ventricular systolic function is normal. Tricuspid regurgitation signal is inadequate for assessing PA pressure. Left Atrium: Left atrial size was normal in size. Right  Atrium: Right atrial size was normal in size. Pericardium: There is no evidence of pericardial effusion. Mitral Valve: No evidence of mitral valve regurgitation. Tricuspid Valve: Tricuspid valve regurgitation is not demonstrated. Aortic Valve: The aortic valve was not well visualized. Aortic valve regurgitation is not visualized. Pulmonic Valve: Pulmonic valve regurgitation is not visualized. Aorta: The aortic root and ascending aorta are structurally normal, with no evidence of dilitation. Venous: The inferior vena cava is normal in size with greater than 50% respiratory variability, suggesting right  atrial pressure of 3 mmHg. IAS/Shunts: No atrial level shunt detected by color flow Doppler. Agitated saline contrast was given intravenously to evaluate for intracardiac shunting.  LEFT VENTRICLE PLAX 2D LVIDd:         5.00 cm      Diastology LVIDs:         3.50 cm      LV e' medial:    6.09 cm/s LV PW:         1.30 cm      LV E/e' medial:  11.4 LV IVS:        1.10 cm      LV e' lateral:   9.79 cm/s LVOT diam:     2.20 cm      LV E/e' lateral: 7.1 LV SV:         68 LV SV Index:   32 LVOT Area:     3.80 cm  LV Volumes (MOD) LV vol d, MOD A2C: 64.7 ml LV vol d, MOD A4C: 114.0 ml LV vol s, MOD A2C: 30.1 ml LV vol s, MOD A4C: 44.8 ml LV SV MOD A2C:     34.6 ml LV SV MOD A4C:     114.0 ml LV SV MOD BP:      53.0 ml RIGHT VENTRICLE             IVC RV S prime:     12.00 cm/s  IVC diam: 1.00 cm TAPSE (M-mode): 2.0 cm LEFT ATRIUM             Index        RIGHT ATRIUM           Index LA diam:        2.40 cm 1.12 cm/m   RA Area:     12.50 cm LA Vol (A2C):   40.1 ml 18.72 ml/m  RA Volume:   29.20 ml  13.63 ml/m LA Vol (A4C):   39.9 ml 18.63 ml/m LA Biplane Vol: 41.2 ml 19.24 ml/m  AORTIC VALVE LVOT Vmax:   108.00 cm/s LVOT Vmean:  69.400 cm/s LVOT VTI:    0.178 m  AORTA Ao Root diam: 3.40 cm Ao Asc diam:  3.70 cm MITRAL VALVE MV Area (PHT): 3.88 cm    SHUNTS MV Decel Time: 196 msec    Systemic VTI:  0.18 m MV E velocity:  69.40 cm/s  Systemic Diam: 2.20 cm MV A velocity: 82.00 cm/s MV E/A ratio:  0.85 Landscape architect signed by Phineas Inches Signature Date/Time: 04/10/2022/9:27:28 AM    Final    CT ANGIO HEAD NECK W WO CM  Result Date: 04/09/2022 CLINICAL DATA:  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 Multiplanar CT image reconstructions and MIPs were obtained to evaluate the vascular anatomy. Carotid stenosis measurements (when applicable) are obtained utilizing NASCET criteria, using the distal internal carotid diameter as the denominator. RADIATION DOSE REDUCTION: This exam was performed according to the departmental dose-optimization program which includes automated exposure control, adjustment of the mA and/or kV according to patient size and/or use of iterative reconstruction technique. CONTRAST:  73m OMNIPAQUE IOHEXOL 350 MG/ML SOLN COMPARISON:  CT head 10/22/2021, MRI head 04/09/2022; no prior CTA FINDINGS: CT HEAD FINDINGS Brain: No evidence of acute infarct, hemorrhage, mass,  mass effect, or midline shift. No hydrocephalus or extra-axial fluid collection. In the possible infarcts noted on the same-day MRI are not definitively seen on this exam. Vascular: No hyperdense vessel. Skull: Normal. Negative for fracture or focal lesion. Sinuses/Orbits: No acute finding. Other: The mastoid air cells are well aerated. 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 Standard aortic branching. Right carotid system: No evidence of stenosis, dissection, or occlusion. Left carotid system: No evidence of stenosis, dissection, or occlusion. Vertebral  arteries: No evidence of stenosis, dissection, or occlusion. Skeleton: No acute osseous abnormality. Other neck: The thyroid nodules noted on the same-day MRI are not as discrete on CT, possibly due to photon starvation in this area due to overlapping soft tissues. No additional acute finding in the neck. Upper chest: No focal pulmonary opacity or pleural effusion. Review of the MIP images confirms the above findings CTA HEAD FINDINGS Anterior circulation: Both internal carotid arteries are patent to the termini, without significant stenosis. A1 segments patent. Normal anterior communicating artery. Anterior cerebral arteries are patent to their distal aspects. No M1 stenosis or occlusion. MCA branches perfused and symmetric. Posterior circulation: Vertebral arteries patent to the vertebrobasilar junction without stenosis. Posterior inferior cerebellar arteries patent proximally. Basilar patent to its distal aspect. Superior cerebellar arteries patent proximally. Patent P1 segments. PCAs perfused to their distal aspects without stenosis. The bilateral posterior communicating arteries are patent. Venous sinuses: As permitted by contrast timing, patent. Anatomic variants: None significant. Review of the MIP images confirms the above findings IMPRESSION: 1. 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 2. No acute intracranial process. The infarcts seen on the 04/09/2022 MRI are not apparent on CT. 3. No intracranial large vessel occlusion or significant stenosis. 4. No hemodynamically significant stenosis in the neck. 5. The thyroid nodules noted on the 04/09/2022 MRI of the cervical spine are not apparent on this study. Further evaluation with nonemergent ultrasound is recommended. (Reference: J Am Coll Radiol. 2015 Feb;12(2): 143-50) These results will be called to the ordering clinician or representative by the Radiologist Assistant, and communication documented in the  PACS or [employer]. Electronically Signed   By: Merilyn Baba M.D.   On: 04/09/2022 23:27   DG FL GUIDED LUMBAR PUNCTURE  Result Date: 04/09/2022 CLINICAL DATA:  47 year old female with sudden onset of weakness of upper extremity, MR brain showed possible demyelinating disease. Request for diagnostic fluoro guided lumbar puncture. EXAM: DIAGNOSTIC LUMBAR PUNCTURE UNDER FLUOROSCOPIC GUIDANCE COMPARISON:  MR lumbar dated 10/03/2020, fluoro guided lumbar puncture dated 05/05/2021. FLUOROSCOPY: Radiation Exposure Index (as provided by the fluoroscopic device): 58 mGy [place] PROCEDURE: Informed consent was obtained from the patient prior to the procedure, including risks and potential complications of bleeding, infection, damage stages in structures, low yield, headache, allergy, and pain. With the patient prone, the lower back was prepped with Betadine. 1% Lidocaine was used for local anesthesia. Two lumbar puncture was attempted at the L3 level using a 3 inches 20 gauge gauge needle, unsuccessful. Dr. Leonia Reeves was called to the procedure room for assistance. The spinal needle was repositioned to slightly lateral at the same level with return of clear CSF. Opening pressure was not measured. 9 ml of CSF were obtained for laboratory studies. The spinal needle was removed, dressing was placed. Post procedure orders placed. The patient tolerated the procedure well and there were no apparent complications. IMPRESSION: Technically  successful diagnostic fluoro guided lumbar puncture. This procedure was performed by Durenda Guthrie, PA-C under supervision of Suzy Bouchard, MD. Electronically Signed   By: Suzy Bouchard M.D.   On: 04/09/2022 18:18   ________________________________________________________________  PHYSICAL EXAM  Neurological Exam: Mental status/Cognition: Alert, oriented x4  Speech/language: Fluent, comprehension intact, object naming intact, repetition intact. Speech clear, no dysarthria  Cranial  nerves:   CN II Pupils equal and reactive to light, no VF deficits    CN III,IV,VI EOM intact, no gaze preference or deviation, no nystagmus. denies diplopia.  no ptosis   CN V normal sensation in V1, V2, and V3 segments bilaterally    CN VII no asymmetry, no nasolabial fold flattening    CN VIII normal hearing to speech    CN IX & X normal palatal elevation, no uvular deviation    CN XI 5/5 head turn and 5/5 shoulder shrug bilaterally    CN XII midline tongue protrusion      Motor:  Muscle bulk and tone: normal Pronator drift: none Tremor: none  Strength: RUE: 5/5    LUE: 5/5  RLE: 5/5       LLE: 5/5    Reflexes: 2+ throughout   Sensation: FACE: normal bil  ARMS: normal bil   LEGS: normal bil   Coordination/Complex Motor:  - Finger to Nose intact BL - Heel to shin intact BL - Rapid alternating movement are normal - Gait: deferred for safety, pt in bed. ________________________________________________________________  ASSESSMENT/PLAN Ms. KIERSTYNN BABICH is a 47 y.o. female with history of hypertension and smoking.  Multiple areas of diffusion restriction in this patient who has had a longstanding history of transient neurological symptoms concerning for demyelinating disease such as multiple sclerosis. However she does also have some stroke risk factors such as hypertension, obesity.    She has frequent transient neurological symptoms of numbness and weakness that are transient and self resolved.  On 04/09/22 she developed sudden onset left upper extremity weakness.  Today she notes the L arm and leg feel back to full strength and sensation.  During stroke workup, noted R brachiocephalic artery clot, now on Heparin IV.  Hypercoag labs still pending.  Lower dopplers pending.   She has had extensive prior neurological evaluation for abnormal MRI scan showing nonspecific T2/FLAIR white matter hyperintensities raising concern for multiple sclerosis but essentially negative  workup for the same including lumbar puncture most recently 05/05/2021 which was normal   Stroke - Multiple bilateral parietal occipital lobe subacute small  infarcts, sembolic pattern, cryptogenic CT head -No acute intracranial abnormality.  CTA head & neck Filling defect in the right brachiocephalic artery, concerning for intraluminal thrombus. MRI  brain- Multiple small areas of diffusion hyperintense signal in the occipital parietal lobes bilaterally. 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 Echo bubble study EF 55% LE venous doppler no DVT TCD bubble study negative for PFO Hypercoagulable and autoimmune work up pending LDL 68 A1C 6.0 UDS positive for THC No antithrombotics PTA, now in heparin gtt. No additional antiplatelet needed from neuro standpoint. Once stable and no planned procedure, can consider transition to eliquis. 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  Started on IV heparin. Lower extremity Doppler studies no DVT  Hypercoag Panel and ANA pending. Vascular surgery consulted.   Repeat CT angiogram in 48 hours, 04/12/22.    ?  Concern for MS Due to lesion locations in brain imaging MRI cervical spine-Mild degenerative change in the cervical spine.  MRI thoracic-Mild thoracic degenerative change.  LP: pending OCB's, resulted CSF unremarkable MRI brain WM changes likely ischemic changes  Hypertension Home meds:  amlodipine '5mg'$  stable Long-term BP goal normotensive  Hyperlipidemia Home meds:  none LDL 68, goal < 70 Well controlled  Other Stroke Risk Factors Former smoker, quit 4 years ago Substance abuse - UDS:  THC POSITIVE. Patient advised to stop using due to stroke risk. Obesity, Body mass index is 37.93 kg/m., BMI >/= 30 associated with increased stroke risk, recommend weight loss, diet and  exercise as appropriate  ? Obstructive sleep apnea: sleep study completed last night inpatient Migraine history  Other Active Problems Thyroid nodules bil: incidental finding on Cervical MRI, [place] completed 04/10/22.  Biopsy will need to be done in the outpatient setting in the next few weeks.    Hospital day # 1  Patient seen and examined by NP with MD. MD to update note as needed.   Dellia Beckwith, [facility] Hospitalist Ph [phone]  ATTENDING NOTE: I reviewed above note and agree with the assessment and plan. Pt was seen and examined.   47 year old female with history of hypertension, obesity admitted for left-sided weakness.  CT no acute abnormality.  CTA head and neck showed right brachiocephalic artery thrombus.  MRI showed bilateral occipital parietal small infarcts.  EF 55%.  LDL 68, A1c 6.0, UDS showed positive for THC.  CSF negative so far, oligoclonal bands pending.  LE venous Doppler no DVT, TCD bubble study no PFO.  Hypercoagulable workup and autoimmune workup pending.  Creatinine 0.68.  On exam, patient obese, AOx3, no focal deficit, neuro intact.  Etiology for patient's stroke and brachiocephalic artery thrombus unclear. Patient denies any OCP use and denies smoking.  Currently on heparin IV, once stable and no more procedure planned, may consider transition to p.o. anticoagulation.  No statin for now given LDL at goal.  VVS on board, repeat CTA in 48 hours.  Continue PT/OT.  Will follow.  For detailed assessment and plan, please refer to above/below as I have made changes wherever appropriate.   Rosalin Hawking, MD PhD Stroke Neurology 04/11/2022 5:18 PM    To contact Stroke Continuity provider, please refer to [email or web address] After hours, contact General Neurology

## 2022-04-11 NOTE — Progress Notes (Signed)
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  No Known Allergies  Patient Measurements: Height: '5\' 6"'$  (167.6 cm) Weight: 106.6 kg (235 lb 0.2 oz) IBW/kg (Calculated) : 59.3 Heparin Dosing Weight: 84 kg  Vital Signs: Temp: 98.5 F (36.9 C) (01/06 0311) Temp Source: Oral (01/06 0311) BP: 136/92 (01/06 0311) Pulse Rate: 88 (01/06 0311)  Labs: Recent Labs    04/09/22 1315 04/09/22 1322 04/10/22 0443 04/10/22 1509 04/10/22 2305 04/11/22 0828  HGB 10.1* 11.2* 10.5*  --   --  10.8*  HCT 34.5* 33.0* 33.9*  --   --  34.6*  PLT 424*  --  441*  --   --  435*  APTT 28  --   --   --   --   --   LABPROT 13.2  --   --   --   --   --   INR 1.0  --   --   --   --   --   HEPARINUNFRC  --   --   --  <0.10* 0.14* 0.20*  CREATININE 0.69 0.50 0.68  --   --   --      Estimated Creatinine Clearance: 108.5 mL/min (by C-G formula based on SCr of 0.68 mg/dL).   Medical History: Past Medical History:  Diagnosis Date   Brain lesion    COVID-19 04/2020   Diplopia    Hypertension    Iron deficiency anemia      Assessment:  46 yoF presenting with recurrent transient numbness and weakness. 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 Neurology has consulted pharmacy to dose heparin with conservative goal and no bolus. Patient was not on anticoagulation prior to admission.   Heparin level remains subtherapeutic 0.20 on 1350 units/hr; Hgb 10.8 and stable, plts 435  Goal of Therapy:  Heparin level 0.3-0.5 units/ml (NO Bolus) Monitor platelets by anticoagulation protocol: Yes   Plan:  Increase heparin infusion to 1500 units/hr Check heparin level in 6 hours until therapeutic x2 Monitor daily CBC, heparin level, and for s/sx of bleeding. F/u bilateral LE doppler studies  Titus Dubin, PharmD PGY1 Pharmacy Resident 04/11/2022 9:26 AM

## 2022-04-11 NOTE — Progress Notes (Signed)
VASCULAR LAB    TCD with bubbles has been performed.  See CV proc for preliminary results.   Danaya Geddis, RVT 04/11/2022, 4:20 PM

## 2022-04-11 NOTE — Progress Notes (Signed)
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  No Known Allergies  Patient Measurements: Height: '5\' 6"'$  (167.6 cm) Weight: 106.6 kg (235 lb 0.2 oz) IBW/kg (Calculated) : 59.3 Heparin Dosing Weight: 84 kg  Vital Signs: Temp: 98.2 F (36.8 C) (01/05 2307) Temp Source: Oral (01/05 2307) BP: 133/85 (01/05 2307) Pulse Rate: 92 (01/05 2307)  Labs: Recent Labs    04/09/22 1315 04/09/22 1322 04/10/22 0443 04/10/22 1509 04/10/22 2305  HGB 10.1* 11.2* 10.5*  --   --   HCT 34.5* 33.0* 33.9*  --   --   PLT 424*  --  441*  --   --   APTT 28  --   --   --   --   LABPROT 13.2  --   --   --   --   INR 1.0  --   --   --   --   HEPARINUNFRC  --   --   --  <0.10* 0.14*  CREATININE 0.69 0.50 0.68  --   --      Estimated Creatinine Clearance: 108.5 mL/min (by C-G formula based on SCr of 0.68 mg/dL).   Medical History: Past Medical History:  Diagnosis Date   Brain lesion    COVID-19 04/2020   Diplopia    Hypertension    Iron deficiency anemia      Assessment:  Connie Herrera presenting as code stroke. 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 Neurology has consulted pharmacy to dose heparin with conservative goal and no bolus. Patient was not on anticoagulation prior to admission.   Heparin level remains subtherapeutic 0.14 on 1150 units/hr; RN reports no issues with infusion or overt s/sx of bleeding  Goal of Therapy:  Heparin level 0.3-0.5 units/ml (NO Bolus) Monitor platelets by anticoagulation protocol: Yes   Plan:  Increase heparin infusion (~2 units/kg/hr) to 1350 units/hr Check heparin level in 6 hours until therapeutic x2 Monitor daily CBC, heparin level, and for s/sx of bleeding. F/u bilateral LE doppler studies  [name], PharmD Clinical Pharmacist 04/11/2022 12:38 AM Please check AMION for all [place] numbers

## 2022-04-11 NOTE — Progress Notes (Signed)
VASCULAR LAB    Bilateral lower extremity venous duplex has been performed.  See CV proc for preliminary results.   Lauri Purdum, RVT 04/11/2022, 4:21 PM

## 2022-04-12 ENCOUNTER — Inpatient Hospital Stay (HOSPITAL_COMMUNITY): Payer: BLUE CROSS/BLUE SHIELD

## 2022-04-12 DIAGNOSIS — I749 Embolism and thrombosis of unspecified artery: Secondary | ICD-10-CM | POA: Diagnosis not present

## 2022-04-12 DIAGNOSIS — E041 Nontoxic single thyroid nodule: Secondary | ICD-10-CM | POA: Diagnosis not present

## 2022-04-12 DIAGNOSIS — I639 Cerebral infarction, unspecified: Secondary | ICD-10-CM | POA: Diagnosis not present

## 2022-04-12 DIAGNOSIS — I708 Atherosclerosis of other arteries: Secondary | ICD-10-CM

## 2022-04-12 DIAGNOSIS — I742 Embolism and thrombosis of arteries of the upper extremities: Secondary | ICD-10-CM | POA: Diagnosis not present

## 2022-04-12 LAB — CBC WITH DIFFERENTIAL/PLATELET
Abs Immature Granulocytes: 0.03 10*3/uL (ref 0.00–0.07)
Basophils Absolute: 0 10*3/uL (ref 0.0–0.1)
Basophils Relative: 0 %
Eosinophils Absolute: 0.1 10*3/uL (ref 0.0–0.5)
Eosinophils Relative: 1 %
HCT: 34.1 % — ABNORMAL LOW (ref 36.0–46.0)
Hemoglobin: 10.6 g/dL — ABNORMAL LOW (ref 12.0–15.0)
Immature Granulocytes: 0 %
Lymphocytes Relative: 23 %
Lymphs Abs: 1.7 10*3/uL (ref 0.7–4.0)
MCH: 22.6 pg — ABNORMAL LOW (ref 26.0–34.0)
MCHC: 31.1 g/dL (ref 30.0–36.0)
MCV: 72.6 fL — ABNORMAL LOW (ref 80.0–100.0)
Monocytes Absolute: 0.6 10*3/uL (ref 0.1–1.0)
Monocytes Relative: 8 %
Neutro Abs: 5.1 10*3/uL (ref 1.7–7.7)
Neutrophils Relative %: 68 %
Platelets: 410 10*3/uL — ABNORMAL HIGH (ref 150–400)
RBC: 4.7 MIL/uL (ref 3.87–5.11)
RDW: 16.7 % — ABNORMAL HIGH (ref 11.5–15.5)
WBC: 7.5 10*3/uL (ref 4.0–10.5)
nRBC: 0 % (ref 0.0–0.2)

## 2022-04-12 LAB — CARDIOLIPIN ANTIBODIES, IGG, IGM, IGA
Anticardiolipin IgA: <9 APL U/mL (ref 0–11)
Anticardiolipin IgG: <9 GPL U/mL (ref 0–14)
Anticardiolipin IgM: <9 MPL U/mL (ref 0–12)

## 2022-04-12 LAB — HEPARIN LEVEL (UNFRACTIONATED)
Heparin Unfractionated: 0.35 IU/mL (ref 0.30–0.70)
Heparin Unfractionated: 0.49 IU/mL (ref 0.30–0.70)

## 2022-04-12 LAB — LUPUS ANTICOAGULANT PANEL
DRVVT: 35 s (ref 0.0–47.0)
PTT Lupus Anticoagulant: 25.6 s (ref 0.0–43.5)

## 2022-04-12 LAB — CBC
HCT: 32.2 % — ABNORMAL LOW (ref 36.0–46.0)
Hemoglobin: 9.9 g/dL — ABNORMAL LOW (ref 12.0–15.0)
MCH: 22.1 pg — ABNORMAL LOW (ref 26.0–34.0)
MCHC: 30.7 g/dL (ref 30.0–36.0)
MCV: 72 fL — ABNORMAL LOW (ref 80.0–100.0)
Platelets: 400 10*3/uL (ref 150–400)
RBC: 4.47 MIL/uL (ref 3.87–5.11)
RDW: 16.5 % — ABNORMAL HIGH (ref 11.5–15.5)
WBC: 8.6 10*3/uL (ref 4.0–10.5)
nRBC: 0 % (ref 0.0–0.2)

## 2022-04-12 LAB — BETA-2-GLYCOPROTEIN I ABS, IGG/M/A
Beta-2 Glyco I IgG: <9 GPI IgG units (ref 0–20)
Beta-2-Glycoprotein I IgA: <9 GPI IgA units (ref 0–25)
Beta-2-Glycoprotein I IgM: <9 GPI IgM units (ref 0–32)

## 2022-04-12 LAB — CSF CULTURE W GRAM STAIN
Culture: NO GROWTH
Gram Stain: NONE SEEN

## 2022-04-12 MED ORDER — MELATONIN 3 MG PO TABS
3.0000 mg | ORAL_TABLET | Freq: Every day | ORAL | Status: DC
  Administered 2022-04-13 – 2022-04-16 (×4): 3 mg via ORAL
  Filled 2022-04-12 (×4): qty 1

## 2022-04-12 MED ORDER — IOHEXOL 350 MG/ML SOLN
100.0000 mL | Freq: Once | INTRAVENOUS | Status: AC | PRN
  Administered 2022-04-12: 100 mL via INTRAVENOUS

## 2022-04-12 MED ORDER — ALPRAZOLAM 0.25 MG PO TABS: 0.2500 mg | ORAL_TABLET | Freq: Three times a day (TID) | ORAL | Status: DC | PRN

## 2022-04-12 NOTE — Progress Notes (Signed)
CT attempted to take pt down, pt required 20 gauge IV about wrist. Pt currently with one point of access with continuous heparin running. RN paged Vascular MD to clarify if heparin could be paused for CT with contrast. MD clarified and heparin can not be paused. Secondary peripheral access needed for CT.

## 2022-04-12 NOTE — Progress Notes (Signed)
STROKE TEAM PROGRESS NOTE   INTERVAL HISTORY Two family members are at the bedside. Pt lying in bed, neuro intact. Still on heparin IV. Pending CTA per VVS. Also discussed with pt about possible TEE to complete stroke work up, she is in agreement.   Vitals:   04/11/22 2004 04/12/22 0012 04/12/22 0418 04/12/22 0812  BP: (!) 135/96 130/84 133/88   Pulse: 96 81 89 86  Resp: '20 16 16 19  '$ Temp: 98.8 F (37.1 C) 98.4 F (36.9 C) 98.3 F (36.8 C)   TempSrc: Oral Oral Oral   SpO2: 97% 98% 98% 98%  Weight:      Height:       CBC:  Recent Labs  Lab 04/09/22 1315 04/09/22 1322 04/11/22 0828 04/12/22 0029  WBC 7.2   < > 7.7 8.6  NEUTROABS 5.3  --   --   --   HGB 10.1*   < > 10.8* 9.9*  HCT 34.5*   < > 34.6* 32.2*  MCV 76.2*   < > 72.5* 72.0*  PLT 424*   < > 435* 400   < > = values in this interval not displayed.   Basic Metabolic Panel:  Recent Labs  Lab 04/10/22 0443 04/11/22 0828  NA 133* 135  K 3.3* 3.4*  CL 101 103  CO2 24 19*  GLUCOSE 101* 109*  BUN 5* 7  CREATININE 0.68 0.78  CALCIUM 8.7* 8.5*  MG 1.8 1.8   Lipid Panel:  Recent Labs  Lab 04/10/22 0443  CHOL 138  TRIG 171*  HDL 36*  CHOLHDL 3.8  VLDL 34  LDLCALC 68   HgbA1c:  Recent Labs  Lab 04/10/22 0443  HGBA1C 6.0*   Urine Drug Screen:  Recent Labs  Lab 04/09/22 2021  LABOPIA NONE DETECTED  COCAINSCRNUR NONE DETECTED  LABBENZ POSITIVE*  AMPHETMU NONE DETECTED  THCU POSITIVE*  LABBARB NONE DETECTED    Alcohol Level  Recent Labs  Lab 04/09/22 1315  ETH <10    IMAGING past 48hrs  VAS [place] TRANSCRANIAL DOPPLER W BUBBLES  Result Date: 04/12/2022  Transcranial Doppler with Bubble Patient Name:  KENEDIE DIROCCO  Date of Exam:   04/11/2022 Medical Rec #: [id number]             Accession #:    [id number] Date of Birth: June 19, 1975            Patient Gender: F Patient Age:   47 years Exam Location:  [facility] Procedure:      VAS [place] TRANSCRANIAL DOPPLER W BUBBLES Referring Phys:  Lesleigh Noe --------------------------------------------------------------------------------  Indications: Stroke. History: Hypertension. History of tobacco use. Frequent transient numbness and weakness. Right brachiocephalic arterial clot by CT. New OSA diagnosis. Comparison Study: No prior study Performing Technologist: Sharion Dove RVS  Examination Guidelines: A complete evaluation includes B-mode imaging, spectral Doppler, color Doppler, and power Doppler as needed of all accessible portions of each vessel. Bilateral testing is considered an integral part of a complete examination. Limited examinations for reoccurring indications may be performed as noted.  Summary: No HITS at rest or during Valsalva. Negative transcranial Doppler Bubble study with no evidence of right to left intracardiac communication.  *See table(s) above for TCD measurements and observations.  Diagnosing physician: Rosalin Hawking MD Electronically signed by Rosalin Hawking MD on 04/12/2022 at 11:38:24 AM.    Final    VAS [place] LOWER EXTREMITY VENOUS (DVT)  Result Date: 04/12/2022  Lower Venous DVT Study Patient Name:  KIMARIA  Michelene Heady  Date of Exam:   04/11/2022 Medical Rec #: [id number]             Accession #:    [id number] Date of Birth: July 10, 1975            Patient Gender: F Patient Age:   41 years Exam Location:  [facility] Procedure:      VAS [place] LOWER EXTREMITY VENOUS (DVT) Referring Phys: Alferd Patee [facility] --------------------------------------------------------------------------------  Indications: Stroke.  Limitations: Patient unable to tolerate compressions secondary to pain. Comparison Study: Prior negative bilateral LEV done 10/03/20 Performing Technologist: Sharion Dove RVS  Examination Guidelines: A complete evaluation includes B-mode imaging, spectral Doppler, color Doppler, and power Doppler as needed of all accessible portions of each vessel. Bilateral testing is considered an integral part of a complete examination.  Limited examinations for reoccurring indications may be performed as noted. The reflux portion of the exam is performed with the patient in reverse Trendelenburg.  +---------+---------------+---------+-----------+----------+-------------------+ RIGHT    CompressibilityPhasicitySpontaneityPropertiesThrombus Aging      +---------+---------------+---------+-----------+----------+-------------------+ CFV      Full                                                             +---------+---------------+---------+-----------+----------+-------------------+ SFJ      Full                                                             +---------+---------------+---------+-----------+----------+-------------------+ FV Prox                 Yes      Yes                  patent by color and                                                       Doppler             +---------+---------------+---------+-----------+----------+-------------------+ FV Mid                  Yes      Yes                  patent by color and                                                       Doppler             +---------+---------------+---------+-----------+----------+-------------------+ FV Distal               Yes      Yes                  patent by color and  Doppler             +---------+---------------+---------+-----------+----------+-------------------+ PFV                                                   Not well visualized +---------+---------------+---------+-----------+----------+-------------------+ POP                     Yes      Yes                  patent by color and                                                       Doppler             +---------+---------------+---------+-----------+----------+-------------------+ PTV      Full                                                              +---------+---------------+---------+-----------+----------+-------------------+ PERO     Full                                                             +---------+---------------+---------+-----------+----------+-------------------+   +---------+---------------+---------+-----------+----------+-------------------+ LEFT     CompressibilityPhasicitySpontaneityPropertiesThrombus Aging      +---------+---------------+---------+-----------+----------+-------------------+ CFV                     Yes      Yes                  patent by color and                                                       Doppler             +---------+---------------+---------+-----------+----------+-------------------+ FV Prox                 Yes      Yes                  patent by color and                                                       Doppler             +---------+---------------+---------+-----------+----------+-------------------+ FV Mid                  Yes      Yes  patent by color and                                                       Doppler             +---------+---------------+---------+-----------+----------+-------------------+ FV Distal               Yes      Yes                  patent by color and                                                       Doppler             +---------+---------------+---------+-----------+----------+-------------------+ PFV                                                   Not well visualized +---------+---------------+---------+-----------+----------+-------------------+ POP                     Yes      Yes                  patent by color and                                                       Doppler             +---------+---------------+---------+-----------+----------+-------------------+ PTV      Full                                                              +---------+---------------+---------+-----------+----------+-------------------+ PERO     Full                                                             +---------+---------------+---------+-----------+----------+-------------------+     Summary: BILATERAL: - No obvious evidence of deep vein thrombosis seen in the visualized veins of the lower extremities, bilaterally. -No evidence of popliteal cyst, bilaterally.   *See table(s) above for measurements and observations. Electronically signed by Orlie Pollen on 04/12/2022 at 7:53:21 AM.    Final    US THYROID  Result Date: 04/10/2022 CLINICAL DATA:  Multiple thyroid nodules EXAM: THYROID ULTRASOUND TECHNIQUE: Ultrasound examination of the thyroid gland and adjacent soft tissues was performed. COMPARISON:  MRI cervical spine 04/09/2022 FINDINGS: Parenchymal Echotexture: Mildly heterogenous Isthmus: 0.8 cm Right lobe: 5.5 x 2.2 x 2.6 cm Left lobe:  6.2 x 2.3 x 2.4 cm _________________________________________________________ Estimated total number of nodules >/= 1 cm: 1 Number of spongiform nodules >/=  2 cm not described below (TR1): 0 Number of mixed cystic and solid nodules >/= 1.5 cm not described below ([place]): 0 _________________________________________________________ Nodule # 1: Location: Left; inferior Maximum size: 2.8 cm; Other 2 dimensions: 1.8 x 1.6 cm Composition: solid/almost completely solid (2) Echogenicity: hypoechoic (2) Shape: not taller-than-wide (0) Margins: smooth (0) Echogenic foci: none (0) ACR TI-RADS total points: 4. ACR TI-RADS risk category: TR4 (4-6 points). ACR TI-RADS recommendations: **Given size (>/= 1.5 cm) and appearance, fine needle aspiration of this moderately suspicious nodule should be considered based on TI-RADS criteria. This nodule corresponds to the left thyroid abnormality seen on recent MRI. No discrete abnormality identified in the right thyroid lobe. _________________________________________________________  IMPRESSION: Nodule 1 (TI-RADS 4) located in the inferior left thyroid lobe measuring 2.8 x 1.8 x 1.6 cm, meets criteria for FNA. The above is in keeping with the ACR TI-RADS recommendations - J Am Coll Radiol 2017;14:587-595. Electronically Signed   By: Miachel Roux M.D.   On: 04/10/2022 15:45     PHYSICAL EXAM  Temp:  [98.3 F (36.8 C)-98.8 F (37.1 C)] 98.3 F (36.8 C) (01/07 0418) Pulse Rate:  [81-96] 86 (01/07 0812) Resp:  [16-20] 19 (01/07 0812) BP: (130-135)/(84-96) 133/88 (01/07 0418) SpO2:  [97 %-98 %] 98 % (01/07 0812)  General - obese, well developed, in no apparent distress.  Ophthalmologic - fundi not visualized due to noncooperation.  Cardiovascular - Regular rhythm and rate.  Mental Status -  Level of arousal and orientation to time, place, and person were intact. Language including expression, naming, repetition, comprehension was assessed and found intact. Attention span and concentration were normal. Fund of Knowledge was assessed and was intact.  Cranial Nerves II - XII - II - Visual field intact OU. III, IV, VI - Extraocular movements intact. V - Facial sensation intact bilaterally. VII - Facial movement intact bilaterally. VIII - Hearing & vestibular intact bilaterally. X - Palate elevates symmetrically. XI - Chin turning & shoulder shrug intact bilaterally. XII - Tongue protrusion intact.  Motor Strength - The patient's strength was normal in all extremities and pronator drift was absent.  Bulk was normal and fasciculations were absent.   Motor Tone - Muscle tone was assessed at the neck and appendages and was normal.  Reflexes - The patient's reflexes were symmetrical in all extremities and she had no pathological reflexes.  Sensory - Light touch, temperature/pinprick were assessed and were symmetrical.    Coordination - The patient had normal movements in the hands and feet with no ataxia or dysmetria.  Tremor was absent.  Gait and Station -  deferred.    ASSESSMENT/PLAN Ms. SUAN PYEATT is a 47 y.o. female with history of hypertension and smoking.  Multiple areas of diffusion restriction in this patient who has had a longstanding history of transient neurological symptoms concerning for demyelinating disease such as multiple sclerosis. However she does also have some stroke risk factors such as hypertension, obesity.    She has frequent transient neurological symptoms of numbness and weakness that are transient and self resolved.  On 04/09/22 she developed sudden onset left upper extremity weakness.  Today she notes the L arm and leg feel back to full strength and sensation.  During stroke workup, noted R brachiocephalic artery clot, now on Heparin IV.  Hypercoag labs still pending.  Lower dopplers pending.   She  has had extensive prior neurological evaluation for abnormal MRI scan showing nonspecific T2/FLAIR white matter hyperintensities raising concern for multiple sclerosis but essentially negative workup for the same including lumbar puncture most recently 05/05/2021 which was normal   Stroke - Multiple bilateral parietal occipital lobe subacute small  infarcts, sembolic pattern, cryptogenic CT head -No acute intracranial abnormality.  CTA head & neck Filling defect in the right brachiocephalic artery, concerning for intraluminal thrombus. MRI  brain- Multiple small areas of diffusion hyperintense signal in the occipital parietal lobes bilaterally. 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 CTA repeat pending Echo bubble study EF 55% LE venous doppler no DVT TCD bubble study negative for PFO Will consider TEE to complete the work up Hypercoagulable and autoimmune work up pending LDL 68 A1C 6.0 UDS positive for THC No antithrombotics PTA, now in heparin gtt. No additional antiplatelet needed from neuro standpoint. Once stable and no planned  procedure, can consider transition to eliquis. 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  Started on IV heparin. Lower extremity Doppler studies no DVT  Hypercoag Panel and ANA pending. Vascular surgery consulted.   Repeat CTA pending    ? Concern for MS Due to lesion locations in brain imaging MRI cervical spine-Mild degenerative change in the cervical spine.  MRI thoracic-Mild thoracic degenerative change.  LP: pending OCB's, resulted CSF unremarkable MRI brain WM changes likely ischemic changes  Hypertension Home meds:  amlodipine '5mg'$  stable Long-term BP goal normotensive  Hyperlipidemia Home meds:  none LDL 68, goal < 70 Well controlled  Other Stroke Risk Factors Former smoker, quit 4 years ago Substance abuse - UDS:  THC POSITIVE. Patient advised to stop using due to stroke risk. Obesity, Body mass index is 37.93 kg/m., BMI >/= 30 associated with increased stroke risk, recommend weight loss, diet and exercise as appropriate  ? Obstructive sleep apnea: sleep study completed last night inpatient Migraine history  Other Active Problems Thyroid nodules bil: incidental finding on Cervical MRI, [place] completed 04/10/22.  Biopsy will need to be done in the outpatient setting in the next few weeks.    Hospital day # 2   Rosalin Hawking, MD PhD Stroke Neurology 04/12/2022 11:45 AM    To contact Stroke Continuity provider, please refer to [email or web address] After hours, contact General Neurology

## 2022-04-12 NOTE — Progress Notes (Addendum)
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  No Known Allergies  Patient Measurements: Height: '5\' 6"'$  (167.6 cm) Weight: 106.6 kg (235 lb 0.2 oz) IBW/kg (Calculated) : 59.3 Heparin Dosing Weight: 84 kg  Vital Signs: Temp: 98.3 F (36.8 C) (01/07 0418) Temp Source: Oral (01/07 0418) BP: 133/88 (01/07 0418) Pulse Rate: 86 (01/07 0812)  Labs: Recent Labs    04/09/22 1315 04/09/22 1322 04/10/22 0443 04/10/22 1509 04/11/22 0828 04/11/22 1625 04/12/22 0029 04/12/22 1202  HGB 10.1* 11.2* 10.5*  --  10.8*  --  9.9* 10.6*  HCT 34.5* 33.0* 33.9*  --  34.6*  --  32.2* 34.1*  PLT 424*  --  441*  --  435*  --  400 410*  APTT 28  --   --   --   --   --   --   --   LABPROT 13.2  --   --   --   --   --   --   --   INR 1.0  --   --   --   --   --   --   --   HEPARINUNFRC  --   --   --    < > 0.20* 0.24* 0.35 0.49  CREATININE 0.69 0.50 0.68  --  0.78  --   --   --    < > = values in this interval not displayed.     Estimated Creatinine Clearance: 108.5 mL/min (by C-G formula based on SCr of 0.78 mg/dL).   Medical History: Past Medical History:  Diagnosis Date   Brain lesion    COVID-19 04/2020   Diplopia    Hypertension    Iron deficiency anemia      Assessment:  53 yoF presenting with recurrent transient numbness and weakness. 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 Neurology has consulted pharmacy to dose heparin with conservative goal and no bolus. Patient was not on anticoagulation prior to admission.   Heparin level therapeutic 0.49 on 1700 units/hr. Hgb stable at 10.6, plts 410. No overt s/s of bleeding documented.   Goal of Therapy:  Heparin level 0.3-0.5 units/ml (NO Bolus) Monitor platelets by anticoagulation protocol: Yes   Plan:  Continue heparin infusion at 1700 units/hr Monitor daily CBC, heparin level, and  for s/sx of bleeding. F/u bilateral LE doppler studies  Titus Dubin, PharmD PGY1 Pharmacy Resident 04/12/2022 12:41 PM

## 2022-04-12 NOTE — Progress Notes (Signed)
[facility] PROGRESS NOTE   Connie Herrera CWC:[id number] DOB: 18-Jul-1975 DOA: 04/09/2022  PCP: Camillia Herter, NP  Brief History/Interval Summary: 47 y.o. female with a past medical history of essential hypertension, previous history of transient numbness and weakness which have resolved spontaneously.  She has been seen by neurology previously.  Underwent lumbar puncture in January 2023.  She was in her usual state of health till this morning when she went to see her primary care provider.  This was for a routine visit.  She came back to her house and then noticed that her left arm was weak.  She had similar symptoms about a week ago but thought that weakness resolved spontaneously. In the emergency department she underwent MRI brain which showed multiple small areas of diffusion hyperintense signal in the occipital parietal lobes bilaterally.  Chronic changes were also noted.  Etiology for these findings were not clear.  Neurology was consulted.    Consultants: Neurology.  Vascular surgery  Procedures: Lumbar puncture    Subjective/Interval History: Patient complains of feeling very anxious.  Did not sleep well last night.  Denies any pain issues.    Assessment/Plan:  Acute stroke Presented with left arm weakness.  Per neurology embolic etiology suspected.   There was also concern for demyelinating process.  Patient underwent lumbar puncture.  Underwent MRI brain cervical spine and thoracic spine. Neurology is following. LDL noted to be 68.  HbA1c is 6.0. TSH 2.4.  RPR nonreactive.  HIV nonreactive Echocardiogram shows normal systolic function with grade 1 diastolic dysfunction. No needs identified by physical and Occupational Therapy.  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  Started on IV heparin.  No bleeding episodes.  Continue heparin.  Decision regarding oral anticoagulation to be made after repeat CT angiogram and based on  vascular surgery input. Lower extremity Doppler study negative for DVT. Hypercoagulable panel is pending. Vascular surgery consulted.  Plan is to repeat the CT angiogram today.  Further decisions to be made after the results of that test is available.  Essential hypertension Holding her medications for now.  Permissive hypertension.  Hypokalemia Repleted yesterday.  Recheck labs tomorrow.  Hyponatremia Continue to monitor.  Microcytic anemia/iron deficiency This is as a result of menorrhagia.   Anemia panel shows ferritin of 5, iron of 26, TIBC 533, percent saturation 5.  Vitamin B12 level is 761, folic acid 60.7. Hemoglobin is stable.  Due to all of her acute issues we are holding off on iron infusion for now.  Consider administering prior to discharge.  Left thyroid nodule Imaging studies suggested nodules in both lobes.  Ultrasound was done which revealed a nodule of concern in the left thyroid lobe.  They recommend a biopsy.  TSH is normal. This was communicated to the patient.  We will wait for her acute issues to subside.  Biopsy will need to be done in the outpatient setting in the next few weeks.    Menorrhagia/uterine fibroids Has not followed up with gynecology in more than a year.  She was told that she would need to do so as soon as possible now that she is on anticoagulants.  This might worsen her menorrhagia.  Situational anxiety and insomnia Will prescribe low-dose alprazolam and melatonin.  Obesity Estimated body mass index is 37.93 kg/m as calculated from the following:   Height as of this encounter: '5\' 6"'$  (1.676 m).   Weight as of this encounter: 106.6 kg.  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     Medications: Scheduled:   melatonin  3 mg Oral QHS   Continuous:  heparin 1,700 Units/hr (04/11/22 1831)   [id number] **OR** acetaminophen (TYLENOL) oral liquid 160 mg/5 mL **OR** acetaminophen, ALPRAZolam, gadobutrol, oxyCODONE  Antibiotics: Anti-infectives (From admission, onward)    None       Objective:  Vital Signs  Vitals:   04/11/22 0311 04/11/22 2004 04/12/22 0012 04/12/22 0418  BP: (!) 136/92 (!) 135/96 130/84 133/88  Pulse: 88 96 81 89  Resp: '12 20 16 16  '$ Temp: 98.5 F (36.9 C) 98.8 F (37.1 C) 98.4 F (36.9 C) 98.3 F (36.8 C)  TempSrc: Oral Oral Oral Oral  SpO2: 96% 97% 98% 98%  Weight:      Height:        Intake/Output Summary (Last 24 hours) at 04/12/2022 0844 Last data filed at 04/11/2022 0941 Gross per 24 hour  Intake 240 ml  Output --  Net 240 ml    Filed Weights   04/09/22 2023  Weight: 106.6 kg    General appearance: Awake alert.  In no distress Resp: Clear to auscultation bilaterally.  Normal effort Cardio: S1-S2 is normal regular.  No S3-S4.  No rubs murmurs or bruit GI: Abdomen is soft.  Nontender nondistended.  Bowel sounds are present normal.  No masses organomegaly Extremities: No edema.  Full range of motion of lower extremities.    Lab Results:  Data Reviewed: I have personally reviewed following labs and reports of the imaging studies  CBC: Recent Labs  Lab 04/09/22 1315 04/09/22 1322 04/10/22 0443 04/11/22 0828 04/12/22 0029  WBC 7.2  --  8.2 7.7 8.6  NEUTROABS 5.3  --   --   --   --   HGB 10.1* 11.2* 10.5* 10.8* 9.9*  HCT 34.5* 33.0* 33.9* 34.6* 32.2*  MCV 76.2*  --  73.1* 72.5* 72.0*  PLT 424*  --  441* 435* 400     Basic Metabolic Panel: Recent Labs  Lab 04/09/22 1315 04/09/22 1322 04/10/22 0443 04/11/22 0828  NA 137 139 133* 135  K 3.2* 3.2* 3.3* 3.4*  CL 105 103 101 103  CO2 23  --  24 19*  GLUCOSE 100* 97 101* 109*  BUN 6 5* 5* 7  CREATININE 0.69 0.50 0.68 0.78  CALCIUM 8.3*  --  8.7* 8.5*  MG  --   --  1.8 1.8      GFR: Estimated Creatinine Clearance: 108.5 mL/min (by C-G formula based on SCr of 0.78 mg/dL).  Liver Function Tests: Recent Labs  Lab 04/09/22 1315 04/10/22 0443  AST 20 22  ALT 24 24  ALKPHOS 76 77  BILITOT <0.1* 0.4  PROT 6.4* 6.8  ALBUMIN 3.4* 3.3*      Coagulation Profile: Recent Labs  Lab 04/09/22 1315  INR 1.0      CBG: Recent Labs  Lab 04/09/22 1038  GLUCAP 101*     Lipid Profile: Recent Labs    04/10/22 0443  CHOL 138  HDL 36*  LDLCALC 68  TRIG 171*  CHOLHDL 3.8     Thyroid Function Tests: Recent Labs    04/10/22 0443  TSH 2.461     Anemia Panel: Recent Labs    04/10/22 0443  VITAMINB12 329  FOLATE 11.2  FERRITIN 5*  TIBC 533*  IRON 26*  RETICCTPCT 1.7     Recent Results (from the past 240 hour(s))  CSF culture w Gram Stain     Status: None (Preliminary result)   Collection Time: 04/09/22  5:08 PM   Specimen: PATH Cytology CSF; Cerebrospinal Fluid  Result Value Ref Range Status   Specimen Description CSF  Final   Special Requests NONE  Final   Gram Stain NO WBC SEEN NO ORGANISMS SEEN CYTOSPIN SMEAR   Final   Culture   Final    NO GROWTH 2 DAYS Performed at [facility], [place] [address], [place], [place] [address]    Report Status PENDING  Incomplete  Culture, fungus without smear     Status: None (Preliminary result)   Collection Time: 04/09/22  5:08 PM   Specimen: PATH Cytology CSF; Cerebrospinal Fluid  Result Value Ref Range Status   Specimen Description CSF  Final   Special Requests NONE  Final   Culture   Final    NO FUNGUS ISOLATED AFTER 1 DAY Performed at [facility], [place] [address] [place], [place] [address]    Report Status PENDING  Incomplete      Radiology Studies: VAS [place] LOWER EXTREMITY VENOUS (DVT)  Result Date: 04/12/2022  Lower Venous DVT Study Patient Name:  Connie Herrera  Date of Exam:   04/11/2022 Medical Rec #: [id number]             Accession #:    [id number] Date  of Birth: August 02, 1975            Patient Gender: F Patient Age:   39 years Exam Location:  [facility] Procedure:      VAS [place] LOWER EXTREMITY VENOUS (DVT) Referring Phys: Alferd Patee [facility] --------------------------------------------------------------------------------  Indications: Stroke.  Limitations: Patient unable to tolerate compressions secondary to pain. Comparison Study: Prior negative bilateral LEV done 10/03/20 Performing Technologist: Sharion Dove RVS  Examination Guidelines: A complete evaluation includes B-mode imaging, spectral Doppler, color Doppler, and power Doppler as needed of all accessible portions of each vessel. Bilateral testing is considered an integral part of a complete examination. Limited examinations for reoccurring indications may be performed as noted. The reflux portion of the exam is performed with the patient in reverse Trendelenburg.  +---------+---------------+---------+-----------+----------+-------------------+ RIGHT    CompressibilityPhasicitySpontaneityPropertiesThrombus Aging      +---------+---------------+---------+-----------+----------+-------------------+ CFV      Full                                                             +---------+---------------+---------+-----------+----------+-------------------+ SFJ      Full                                                             +---------+---------------+---------+-----------+----------+-------------------+ FV Prox                 Yes      Yes                  patent by color and  Doppler             +---------+---------------+---------+-----------+----------+-------------------+ FV Mid                  Yes      Yes                  patent by color and                                                       Doppler             +---------+---------------+---------+-----------+----------+-------------------+ FV  Distal               Yes      Yes                  patent by color and                                                       Doppler             +---------+---------------+---------+-----------+----------+-------------------+ PFV                                                   Not well visualized +---------+---------------+---------+-----------+----------+-------------------+ POP                     Yes      Yes                  patent by color and                                                       Doppler             +---------+---------------+---------+-----------+----------+-------------------+ PTV      Full                                                             +---------+---------------+---------+-----------+----------+-------------------+ PERO     Full                                                             +---------+---------------+---------+-----------+----------+-------------------+   +---------+---------------+---------+-----------+----------+-------------------+ LEFT     CompressibilityPhasicitySpontaneityPropertiesThrombus Aging      +---------+---------------+---------+-----------+----------+-------------------+ CFV                     Yes      Yes  patent by color and                                                       Doppler             +---------+---------------+---------+-----------+----------+-------------------+ FV Prox                 Yes      Yes                  patent by color and                                                       Doppler             +---------+---------------+---------+-----------+----------+-------------------+ FV Mid                  Yes      Yes                  patent by color and                                                       Doppler             +---------+---------------+---------+-----------+----------+-------------------+ FV  Distal               Yes      Yes                  patent by color and                                                       Doppler             +---------+---------------+---------+-----------+----------+-------------------+ PFV                                                   Not well visualized +---------+---------------+---------+-----------+----------+-------------------+ POP                     Yes      Yes                  patent by color and                                                       Doppler             +---------+---------------+---------+-----------+----------+-------------------+ PTV      Full                                                             +---------+---------------+---------+-----------+----------+-------------------+  PERO     Full                                                             +---------+---------------+---------+-----------+----------+-------------------+     Summary: BILATERAL: - No obvious evidence of deep vein thrombosis seen in the visualized veins of the lower extremities, bilaterally. -No evidence of popliteal cyst, bilaterally.   *See table(s) above for measurements and observations. Electronically signed by Orlie Pollen on 04/12/2022 at 7:53:21 AM.    Final    VAS [place] TRANSCRANIAL DOPPLER W BUBBLES  Result Date: 04/11/2022  Transcranial Doppler with Bubble Patient Name:  Connie Herrera  Date of Exam:   04/11/2022 Medical Rec #: [id number]             Accession #:    [id number] Date of Birth: [birth year]            Patient Gender: F Patient Age:   72 years Exam [facility]  [facility] Procedure:      VAS [place] TRANSCRANIAL DOPPLER W BUBBLES Referring Phys: Lesleigh Noe --------------------------------------------------------------------------------  Indications: Stroke. History: Hypertension. History of tobacco use. Frequent transient numbness and weakness. Right brachiocephalic arterial clot by CT.  New OSA diagnosis. Comparison Study: No prior study Performing Technologist: Sharion Dove RVS  Examination Guidelines: A complete evaluation includes B-mode imaging, spectral Doppler, color Doppler, and power Doppler as needed of all accessible portions of each vessel. Bilateral testing is considered an integral part of a complete examination. Limited examinations for reoccurring indications may be performed as noted.  Summary: No HITS at rest or during Valsalva. Negative transcranial Doppler Bubble study with no evidence of right to left intracardiac communication.  *See table(s) above for TCD measurements and observations.    Preliminary    US THYROID  Result Date: 04/10/2022 CLINICAL DATA:  Multiple thyroid nodules EXAM: THYROID ULTRASOUND TECHNIQUE: Ultrasound examination of the thyroid gland and adjacent soft tissues was performed. COMPARISON:  MRI cervical spine 04/09/2022 FINDINGS: Parenchymal Echotexture: Mildly heterogenous Isthmus: 0.8 cm Right lobe: 5.5 x 2.2 x 2.6 cm Left lobe: 6.2 x 2.3 x 2.4 cm _________________________________________________________ Estimated total number of nodules >/= 1 cm: 1 Number of spongiform nodules >/=  2 cm not described below (TR1): 0 Number of mixed cystic and solid nodules >/= 1.5 cm not described below ([place]): 0 _________________________________________________________ Nodule # 1: Location: Left; inferior Maximum size: 2.8 cm; Other 2 dimensions: 1.8 x 1.6 cm Composition: solid/almost completely solid (2) Echogenicity: hypoechoic (2) Shape: not taller-than-wide (0) Margins: smooth (0) Echogenic foci: none (0) ACR TI-RADS total points: 4. ACR TI-RADS risk category: TR4 (4-6 points). ACR TI-RADS recommendations: **Given size (>/= 1.5 cm) and appearance, fine needle aspiration of this moderately suspicious nodule should be considered based on TI-RADS criteria. This nodule corresponds to the left thyroid abnormality seen on recent MRI. No discrete abnormality identified  in the right thyroid lobe. _________________________________________________________ IMPRESSION: Nodule 1 (TI-RADS 4) located in the inferior left thyroid lobe measuring 2.8 x 1.8 x 1.6 cm, meets criteria for FNA. The above is in keeping with the ACR TI-RADS recommendations - J Am Coll Radiol 2017;14:587-595. Electronically Signed   By: Miachel Roux M.D.   On: 04/10/2022 15:45       LOS: 2 days   Bonnielee Haff  [facility] Pager on [email or web address]  04/12/2022, 8:44 AM

## 2022-04-12 NOTE — Progress Notes (Signed)
ANTICOAGULATION CONSULT NOTE - [place] for heparin Indication: intraluminal thrombus in the R brachiocephalic artery.  No Known Allergies  Patient Measurements: Height: '5\' 6"'$  (167.6 cm) Weight: 106.6 kg (235 lb 0.2 oz) IBW/kg (Calculated) : 59.3 Heparin Dosing Weight: 84 kg  Vital Signs: Temp: 98.4 F (36.9 C) (01/07 0012) Temp Source: Oral (01/07 0012) BP: 130/84 (01/07 0012) Pulse Rate: 81 (01/07 0012)  Labs: Recent Labs    04/09/22 1315 04/09/22 1322 04/10/22 0443 04/10/22 1509 04/11/22 0828 04/11/22 1625 04/12/22 0029  HGB 10.1* 11.2* 10.5*  --  10.8*  --  9.9*  HCT 34.5* 33.0* 33.9*  --  34.6*  --  32.2*  PLT 424*  --  441*  --  435*  --  400  APTT 28  --   --   --   --   --   --   LABPROT 13.2  --   --   --   --   --   --   INR 1.0  --   --   --   --   --   --   HEPARINUNFRC  --   --   --    < > 0.20* 0.24* 0.35  CREATININE 0.69 0.50 0.68  --  0.78  --   --    < > = values in this interval not displayed.     Estimated Creatinine Clearance: 108.5 mL/min (by C-G formula based on SCr of 0.78 mg/dL).   Medical History: Past Medical History:  Diagnosis Date   Brain lesion    COVID-19 04/2020   Diplopia    Hypertension    Iron deficiency anemia      Assessment:  63 yoF presenting with recurrent transient numbness and weakness. 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 Neurology has consulted pharmacy to dose heparin with conservative goal and no bolus. Patient was not on anticoagulation prior to admission.   Heparin level therapeutic 0.35 on 1700 units/hr. No issues with infusion or overt s/sx of bleeding documented. Hgb has been trending down slightly since admit. 11.2>9.9  Goal of Therapy:  Heparin level 0.3-0.5 units/ml (NO Bolus) Monitor platelets by anticoagulation protocol: Yes   Plan:  Continue heparin infusion at 1700 units/hr Check heparin level in 6 hours until  therapeutic x2 Monitor daily CBC, heparin level, and for s/sx of bleeding. F/u bilateral LE doppler studies  [name], PharmD Clinical Pharmacist 04/12/2022 3:07 AM Please check AMION for all [place] numbers

## 2022-04-12 NOTE — Progress Notes (Signed)
  Daily Progress Note  Incidental brachiocephalic artery thrombus  Subjective: Doing well this morning. Denies sensory or motor deficits. Slept better with CPAP  Objective: Vitals:   04/12/22 0012 04/12/22 0418  BP: 130/84 133/88  Pulse: 81 89  Resp: 16 16  Temp: 98.4 F (36.9 C) 98.3 F (36.8 C)  SpO2: 98% 98%    Physical Examination Palpable pulses in all extremities, normal strength all extremities Nonlabored breathing Regular rate   ASSESSMENT/PLAN:  89-year female who presents with signs and symptoms concerning for demyelinating process.  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  Currently being heparinized as intervention risk carries a high risk of stroke. Plan for reimaging at the 48-hour mark in an effort to assess regression.  Possible surgical need pending new imaging Sunday. Surgery timing early next week if necessary.  CT angio chest ordered for later today   Cassandria Santee MD MS [facility] [phone] 04/12/2022  8:04 AM

## 2022-04-12 NOTE — Evaluation (Signed)
Speech Language Pathology Evaluation Patient Details Name: Connie Herrera MRN: [id number] DOB: 1975/07/02 Today's Date: 04/12/2022 Time: 0900-0930 SLP Time Calculation (min) (ACUTE ONLY): 30 min  Problem List:  Patient Active Problem List   Diagnosis Date Noted   Acute CVA (cerebrovascular accident) ([place]) 04/11/2022   Arterial thrombosis ([place]) 04/11/2022   Left arm weakness 04/09/2022   Iron deficiency anemia 04/09/2022   Hypokalemia 04/09/2022   Muscle spasms of both lower extremities 08/14/2020   Fibroid uterus 08/14/2020   Anemia due to blood loss, chronic 01/10/2020   Menorrhagia with regular cycle 01/09/2020   Class 1 obesity due to excess calories with body mass index (BMI) of 34.0 to 34.9 in adult 01/09/2020   Screening breast examination 02/07/2019   Breast pain 02/07/2019   Hypertension 01/26/2019   Paresthesias 08/04/2013   Breast pain, right 03/11/2012   Past Medical History:  Past Medical History:  Diagnosis Date   Brain lesion    COVID-19 04/2020   Diplopia    Hypertension    Iron deficiency anemia    Past Surgical History:  Past Surgical History:  Procedure Laterality Date   TUBAL LIGATION     HPI:  47 y.o. female with a past medical history of essential hypertension, previous history of transient numbness and weakness which have resolved spontaneously.  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 This is most  likely an area of chronic hemorrhage due to cavernoma"   Assessment / Plan / Recommendation Clinical Impression  Connie Herrera was seen for a speech/cognitive evaluation d/t hospitalization for neuro symptoms (left arm weakness) with multiple areas of diffusion hyperintense signal in occipital parietal lobes bilaterally. She also has known chronic hemorrhage in left lateral basal ganglia with  mild peripheral enhancement due to  cavernoma. It is considered stable at this time. Pt does endorse prior confusion/difficulty thinking of words or thinking clearly. She states she was unable to come up with month upon arrival to hospital. All symptoms have resolved at this time. Speech was fluent and coherent. She was oriented and demonstrated no word finding difficulties. No dysarthria. Pt and SLP discussed the possible fluctuations of symptoms in setting of a basal ganglia cavernoma. She reports she was unaware of this dx; however, sister on phone verbalized they had been educated on cavernoma back in 2022 when it was initially found. No speech strategies indicated. No further ST needed at this time. No dysphagia concerns.    SLP Assessment  SLP Recommendation/Assessment: Patient does not need any further Speech Lanaguage Pathology Services SLP Visit Diagnosis: Dysarthria and anarthria (R47.1);Cognitive communication deficit (R41.841)    Recommendations for follow up therapy are one component of a multi-disciplinary discharge planning process, led by the attending physician.  Recommendations may be updated based on patient status, additional functional criteria and insurance authorization.    Follow Up Recommendations  No SLP follow up    Assistance Recommended at Discharge  None     SLP Evaluation Cognition  Overall Cognitive Status: Within Functional Limits for tasks assessed Orientation Level: Oriented X4 Attention: Focused Memory: Appears intact Awareness: Appears intact Problem Solving: Appears intact Executive Function: Reasoning Reasoning: Appears intact Safety/Judgment: Appears intact       Comprehension  Auditory Comprehension Overall Auditory Comprehension: Appears within functional limits for tasks assessed Yes/No Questions: Within Functional Limits Visual Recognition/Discrimination Discrimination: Within Function Limits Reading Comprehension Reading Status: Within funtional limits  Expression   WNL   Oral / Motor  Oral Motor/Sensory Function Overall Oral Motor/Sensory Function: Within functional limits Motor Speech Overall Motor Speech: Appears within functional limits for tasks assessed Respiration: Within functional limits Articulation: Within functional limitis Intelligibility: Intelligible           Brittannie Tawney P. Gailene Youkhana, M.S., [place] Pathologist Acute Rehabilitation Services Pager: [place] 04/12/2022, 9:33 AM

## 2022-04-13 ENCOUNTER — Other Ambulatory Visit (HOSPITAL_COMMUNITY): Payer: Self-pay

## 2022-04-13 DIAGNOSIS — I639 Cerebral infarction, unspecified: Secondary | ICD-10-CM | POA: Diagnosis not present

## 2022-04-13 DIAGNOSIS — I771 Stricture of artery: Secondary | ICD-10-CM | POA: Diagnosis not present

## 2022-04-13 DIAGNOSIS — I749 Embolism and thrombosis of unspecified artery: Secondary | ICD-10-CM | POA: Diagnosis not present

## 2022-04-13 DIAGNOSIS — E041 Nontoxic single thyroid nodule: Secondary | ICD-10-CM | POA: Diagnosis not present

## 2022-04-13 DIAGNOSIS — I742 Embolism and thrombosis of arteries of the upper extremities: Secondary | ICD-10-CM | POA: Diagnosis not present

## 2022-04-13 LAB — BASIC METABOLIC PANEL
Anion gap: 11 (ref 5–15)
BUN: 9 mg/dL (ref 6–20)
CO2: 21 mmol/L — ABNORMAL LOW (ref 22–32)
Calcium: 8.8 mg/dL — ABNORMAL LOW (ref 8.9–10.3)
Chloride: 102 mmol/L (ref 98–111)
Creatinine, Ser: 0.76 mg/dL (ref 0.44–1.00)
GFR, Estimated: >60 mL/min (ref >60)
Glucose, Bld: 97 mg/dL (ref 70–99)
Potassium: 3.4 mmol/L — ABNORMAL LOW (ref 3.5–5.1)
Sodium: 134 mmol/L — ABNORMAL LOW (ref 135–145)

## 2022-04-13 LAB — CBC
HCT: 34.1 % — ABNORMAL LOW (ref 36.0–46.0)
Hemoglobin: 10.1 g/dL — ABNORMAL LOW (ref 12.0–15.0)
MCH: 22 pg — ABNORMAL LOW (ref 26.0–34.0)
MCHC: 29.6 g/dL — ABNORMAL LOW (ref 30.0–36.0)
MCV: 74.1 fL — ABNORMAL LOW (ref 80.0–100.0)
Platelets: 410 10*3/uL — ABNORMAL HIGH (ref 150–400)
RBC: 4.6 MIL/uL (ref 3.87–5.11)
RDW: 16.7 % — ABNORMAL HIGH (ref 11.5–15.5)
WBC: 7.6 10*3/uL (ref 4.0–10.5)
nRBC: 0 % (ref 0.0–0.2)

## 2022-04-13 LAB — MAGNESIUM: Magnesium: 1.8 mg/dL (ref 1.7–2.4)

## 2022-04-13 LAB — HEPARIN LEVEL (UNFRACTIONATED): Heparin Unfractionated: 0.53 IU/mL (ref 0.30–0.70)

## 2022-04-13 LAB — VDRL, CSF: VDRL Quant, CSF: NONREACTIVE

## 2022-04-13 LAB — HOMOCYSTEINE: Homocysteine: 8.1 umol/L (ref 0.0–14.5)

## 2022-04-13 LAB — PROTEIN C, TOTAL: Protein C, Total: 117 % (ref 60–150)

## 2022-04-13 MED ORDER — MAGNESIUM SULFATE 2 GM/50ML IV SOLN
2.0000 g | Freq: Once | INTRAVENOUS | Status: AC
  Administered 2022-04-13: 2 g via INTRAVENOUS
  Filled 2022-04-13: qty 50

## 2022-04-13 MED ORDER — SENNOSIDES-DOCUSATE SODIUM 8.6-50 MG PO TABS
2.0000 | ORAL_TABLET | Freq: Two times a day (BID) | ORAL | Status: DC
  Administered 2022-04-13 – 2022-04-16 (×4): 2 via ORAL
  Filled 2022-04-13 (×7): qty 2

## 2022-04-13 MED ORDER — POLYETHYLENE GLYCOL 3350 17 G PO PACK
17.0000 g | PACK | Freq: Every day | ORAL | Status: DC
  Administered 2022-04-13: 17 g via ORAL
  Filled 2022-04-13: qty 1

## 2022-04-13 MED ORDER — POTASSIUM CHLORIDE CRYS ER 20 MEQ PO TBCR
40.0000 meq | EXTENDED_RELEASE_TABLET | Freq: Two times a day (BID) | ORAL | Status: AC
  Administered 2022-04-13 (×2): 40 meq via ORAL
  Filled 2022-04-13 (×2): qty 2

## 2022-04-13 NOTE — Progress Notes (Signed)
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  No Known Allergies  Patient Measurements: Height: '5\' 6"'$  (167.6 cm) Weight: 106.6 kg (235 lb 0.2 oz) IBW/kg (Calculated) : 59.3 Heparin Dosing Weight: 84 kg  Vital Signs: Temp: 98 F (36.7 C) (01/08 1132) Temp Source: Oral (01/08 1132) BP: 131/88 (01/08 1132) Pulse Rate: 93 (01/08 1132)  Labs: Recent Labs    04/11/22 0828 04/11/22 1625 04/12/22 0029 04/12/22 1202 04/13/22 0258  HGB 10.8*  --  9.9* 10.6* 10.1*  HCT 34.6*  --  32.2* 34.1* 34.1*  PLT 435*  --  400 410* 410*  HEPARINUNFRC 0.20*   < > 0.35 0.49 0.53  CREATININE 0.78  --   --   --  0.76   < > = values in this interval not displayed.     Estimated Creatinine Clearance: 108.5 mL/min (by C-G formula based on SCr of 0.76 mg/dL).   Medical History: Past Medical History:  Diagnosis Date   Brain lesion    COVID-19 04/2020   Diplopia    Hypertension    Iron deficiency anemia      Assessment:  89 yoF presenting with recurrent transient numbness and weakness. 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 Neurology has consulted pharmacy to dose heparin with conservative goal and no bolus. Patient was not on anticoagulation prior to admission.   04/13/22: Heparin level  0.53 on Heparin drip at 1700 units/hr,  this level is slightly above target of 0.3-0.5 range.. Hgb stable at 10.1, plts stable in 400s. No overt s/s of bleeding documented.   1/4 CT angio head :  filling defect in the right brachiocephalic artery concerning for intraluminal thrombus.   1/6 F/u bilateral LE doppler studies:  no obvious evidence for DVT bilaterally.  1/7 CT angio chest: re-demonstrated nonocclusive left brachiocephalic artery thrombus.  [facility] plans for stenting procedure tomorrow 04/14/22.  Goal of Therapy:  Heparin level 0.3-0.5 units/ml (NO  Bolus) Monitor platelets by anticoagulation protocol: Yes   Plan:  Decrease heparin infusion to 1650 units/hr to keep heparin level within 0.3-0.5 units/ml goal. Monitor daily CBC, heparin level, and for s/sx of bleeding. Follow up post stenting on 1/9  Thank you for allowing pharmacy to be part of this patients care team.  Nicole Cella, [place] Pharmacist 04/13/2022 2:14 PM   Please check AMION for all [place] phone numbers After 10:00 PM, call [place]

## 2022-04-13 NOTE — TOC Benefit Eligibility Note (Signed)
Patient Teacher, English as a foreign language completed.    The patient is currently admitted and upon discharge could be taking Eliquis 5 mg.  The current 30 day co-pay is $4.00.   The patient is currently admitted and upon discharge could be taking Xarelto 20 mg.  The current 30 day co-pay is $4.00.   The patient is insured through [employer] and [place], [place] Patient Advocate Specialist [facility] Patient Advocate Team Direct Number: [phone]  Fax: [phone]

## 2022-04-13 NOTE — Progress Notes (Signed)
STROKE TEAM PROGRESS NOTE   INTERVAL HISTORY Two family members are at the bedside. Pt sitting in bed, neuro intact. Still on heparin IV. CTA repeat yesterday showed similar brachiocephalic A thrombus. Per pt, Dr Virl Cagey will do the stenting tomorrow am.   Vitals:   04/12/22 2342 04/13/22 0419 04/13/22 0819 04/13/22 1132  BP: (!) 128/90 111/71 (!) 145/105 131/88  Pulse: 86  88 93  Resp: '15 18 15 17  '$ Temp: 98.2 F (36.8 C) 98.7 F (37.1 C) 97.7 F (36.5 C) 98 F (36.7 C)  TempSrc: Oral Oral Oral Oral  SpO2: 98%  98% 98%  Weight:      Height:       CBC:  Recent Labs  Lab 04/09/22 1315 04/09/22 1322 04/12/22 1202 04/13/22 0258  WBC 7.2   < > 7.5 7.6  NEUTROABS 5.3  --  5.1  --   HGB 10.1*   < > 10.6* 10.1*  HCT 34.5*   < > 34.1* 34.1*  MCV 76.2*   < > 72.6* 74.1*  PLT 424*   < > 410* 410*   < > = values in this interval not displayed.   Basic Metabolic Panel:  Recent Labs  Lab 04/11/22 0828 04/13/22 0258  NA 135 134*  K 3.4* 3.4*  CL 103 102  CO2 19* 21*  GLUCOSE 109* 97  BUN 7 9  CREATININE 0.78 0.76  CALCIUM 8.5* 8.8*  MG 1.8 1.8   Lipid Panel:  Recent Labs  Lab 04/10/22 0443  CHOL 138  TRIG 171*  HDL 36*  CHOLHDL 3.8  VLDL 34  LDLCALC 68   HgbA1c:  Recent Labs  Lab 04/10/22 0443  HGBA1C 6.0*   Urine Drug Screen:  Recent Labs  Lab 04/09/22 2021  LABOPIA NONE DETECTED  COCAINSCRNUR NONE DETECTED  LABBENZ POSITIVE*  AMPHETMU NONE DETECTED  THCU POSITIVE*  LABBARB NONE DETECTED    Alcohol Level  Recent Labs  Lab 04/09/22 1315  ETH <10    IMAGING past 48hrs  CT ANGIO CHEST AORTA W/CM & OR WO/CM  Result Date: 04/12/2022 CLINICAL DATA:  Acute CVA. Code stroke. Brachiocephalic thrombus seen on CT 04/09/2022. EXAM: CT ANGIOGRAPHY CHEST WITH CONTRAST TECHNIQUE: Multidetector CT imaging of the chest was performed using the standard protocol during bolus administration of intravenous contrast. Multiplanar CT image reconstructions and MIPs  were obtained to evaluate the vascular anatomy. RADIATION DOSE REDUCTION: This exam was performed according to the departmental dose-optimization program which includes automated exposure control, adjustment of the mA and/or kV according to patient size and/or use of iterative reconstruction technique. CONTRAST:  167m OMNIPAQUE IOHEXOL 350 MG/ML SOLN COMPARISON:  CTA head and neck 04/09/2022; chest radiographs 02/15/2022 FINDINGS: Cardiovascular: No aortic intramural hemotoma. Preferential opacification of the thoracic aorta. No evidence of thoracic aortic aneurysm or dissection. Normal heart size. No pericardial effusion. No central pulmonary embolism. Nonocclusive left brachiocephalic artery thrombus was better visualized on CTA head and neck 04/09/2022 as it is largely obscured by streak artifact from dense contrast bolus in the left brachiocephalic vein. No aortic thrombus. The visualized portions of the right carotid and subclavian arteries are patent without thrombus. Mediastinum/Nodes: Left thyroid nodule better evaluated on ultrasound 04/10/2022. See that report for details. Unremarkable esophagus. No thoracic adenopathy by size. Lungs/Pleura: Lungs are clear. No pleural effusion or pneumothorax. Solid 4 mm nodule along the right major fissure (8/69). No routine follow-up imaging is recommended. Upper Abdomen: No acute abnormality. Musculoskeletal: No chest wall abnormality. No acute  or significant osseous findings. Review of the MIP images confirms the above findings. IMPRESSION: Redemonstrated nonocclusive left brachiocephalic artery thrombus. This was better visualized on CTA head and neck 04/09/2022 as it is largely obscured by streak artifact from dense contrast bolus in the left brachiocephalic vein. No additional arterial thrombus is visualized. Electronically Signed   By: Placido Sou M.D.   On: 04/12/2022 21:50   VAS [place] TRANSCRANIAL DOPPLER W BUBBLES  Result Date: 04/12/2022  Transcranial  Doppler with Bubble Patient Name:  Connie Herrera  Date of Exam:   04/11/2022 Medical Rec #: [id number]             Accession #:    [id number] Date of Birth: 12-14-1975            Patient Gender: F Patient Age:   47 years Exam Location:  [facility] Procedure:      VAS [place] TRANSCRANIAL DOPPLER W BUBBLES Referring Phys: Lesleigh Noe --------------------------------------------------------------------------------  Indications: Stroke. History: Hypertension. History of tobacco use. Frequent transient numbness and weakness. Right brachiocephalic arterial clot by CT. New OSA diagnosis. Comparison Study: No prior study Performing Technologist: Sharion Dove RVS  Examination Guidelines: A complete evaluation includes B-mode imaging, spectral Doppler, color Doppler, and power Doppler as needed of all accessible portions of each vessel. Bilateral testing is considered an integral part of a complete examination. Limited examinations for reoccurring indications may be performed as noted.  Summary: No HITS at rest or during Valsalva. Negative transcranial Doppler Bubble study with no evidence of right to left intracardiac communication.  *See table(s) above for TCD measurements and observations.  Diagnosing physician: Rosalin Hawking MD Electronically signed by Rosalin Hawking MD on 04/12/2022 at 11:38:24 AM.    Final    VAS [place] LOWER EXTREMITY VENOUS (DVT)  Result Date: 04/12/2022  Lower Venous DVT Study Patient Name:  Connie Herrera  Date of Exam:   04/11/2022 Medical Rec #: [id number]             Accession #:    [id number] Date of Birth: [birth year]            Patient Gender: F Patient Age:   84 years Exam Location:  [facility] Procedure:      VAS [place] LOWER EXTREMITY VENOUS (DVT) Referring Phys: Donnetta Simpers --------------------------------------------------------------------------------  Indications: Stroke.  Limitations: Patient unable to tolerate compressions secondary to pain. Comparison Study: Prior  negative bilateral LEV done 10/03/20 Performing Technologist: Sharion Dove RVS  Examination Guidelines: A complete evaluation includes B-mode imaging, spectral Doppler, color Doppler, and power Doppler as needed of all accessible portions of each vessel. Bilateral testing is considered an integral part of a complete examination. Limited examinations for reoccurring indications may be performed as noted. The reflux portion of the exam is performed with the patient in reverse Trendelenburg.  +---------+---------------+---------+-----------+----------+-------------------+ RIGHT    CompressibilityPhasicitySpontaneityPropertiesThrombus Aging      +---------+---------------+---------+-----------+----------+-------------------+ CFV      Full                                                             +---------+---------------+---------+-----------+----------+-------------------+ SFJ      Full                                                             +---------+---------------+---------+-----------+----------+-------------------+  FV Prox                 Yes      Yes                  patent by color and                                                       Doppler             +---------+---------------+---------+-----------+----------+-------------------+ FV Mid                  Yes      Yes                  patent by color and                                                       Doppler             +---------+---------------+---------+-----------+----------+-------------------+ FV Distal               Yes      Yes                  patent by color and                                                       Doppler             +---------+---------------+---------+-----------+----------+-------------------+ PFV                                                   Not well visualized  +---------+---------------+---------+-----------+----------+-------------------+ POP                     Yes      Yes                  patent by color and                                                       Doppler             +---------+---------------+---------+-----------+----------+-------------------+ PTV      Full                                                             +---------+---------------+---------+-----------+----------+-------------------+ PERO     Full                                                             +---------+---------------+---------+-----------+----------+-------------------+   +---------+---------------+---------+-----------+----------+-------------------+  LEFT     CompressibilityPhasicitySpontaneityPropertiesThrombus Aging      +---------+---------------+---------+-----------+----------+-------------------+ CFV                     Yes      Yes                  patent by color and                                                       Doppler             +---------+---------------+---------+-----------+----------+-------------------+ FV Prox                 Yes      Yes                  patent by color and                                                       Doppler             +---------+---------------+---------+-----------+----------+-------------------+ FV Mid                  Yes      Yes                  patent by color and                                                       Doppler             +---------+---------------+---------+-----------+----------+-------------------+ FV Distal               Yes      Yes                  patent by color and                                                       Doppler             +---------+---------------+---------+-----------+----------+-------------------+ PFV                                                   Not well visualized  +---------+---------------+---------+-----------+----------+-------------------+ POP                     Yes      Yes                  patent by color and  Doppler             +---------+---------------+---------+-----------+----------+-------------------+ PTV      Full                                                             +---------+---------------+---------+-----------+----------+-------------------+ PERO     Full                                                             +---------+---------------+---------+-----------+----------+-------------------+     Summary: BILATERAL: - No obvious evidence of deep vein thrombosis seen in the visualized veins of the lower extremities, bilaterally. -No evidence of popliteal cyst, bilaterally.   *See table(s) above for measurements and observations. Electronically signed by Orlie Pollen on 04/12/2022 at 7:53:21 AM.    Final      PHYSICAL EXAM  Temp:  [97.7 F (36.5 C)-98.7 F (37.1 C)] 98 F (36.7 C) (01/08 1132) Pulse Rate:  [86-93] 93 (01/08 1132) Resp:  [15-18] 17 (01/08 1132) BP: (111-150)/(71-105) 131/88 (01/08 1132) SpO2:  [98 %] 98 % (01/08 1132)  General - obese, well developed, in no apparent distress.  Ophthalmologic - fundi not visualized due to noncooperation.  Cardiovascular - Regular rhythm and rate.  Mental Status -  Level of arousal and orientation to time, place, and person were intact. Language including expression, naming, repetition, comprehension was assessed and found intact. Attention span and concentration were normal. Fund of Knowledge was assessed and was intact.  Cranial Nerves II - XII - II - Visual field intact OU. III, IV, VI - Extraocular movements intact. V - Facial sensation intact bilaterally. VII - Facial movement intact bilaterally. VIII - Hearing & vestibular intact bilaterally. X - Palate elevates symmetrically. XI - Chin  turning & shoulder shrug intact bilaterally. XII - Tongue protrusion intact.  Motor Strength - The patient's strength was normal in all extremities and pronator drift was absent.  Bulk was normal and fasciculations were absent.   Motor Tone - Muscle tone was assessed at the neck and appendages and was normal.  Reflexes - The patient's reflexes were symmetrical in all extremities and she had no pathological reflexes.  Sensory - Light touch, temperature/pinprick were assessed and were symmetrical.    Coordination - The patient had normal movements in the hands and feet with no ataxia or dysmetria.  Tremor was absent.  Gait and Station - deferred.    ASSESSMENT/PLAN Ms. SUMAIYA ARRUDA is a 47 y.o. female with history of hypertension and smoking.  Multiple areas of diffusion restriction in this patient who has had a longstanding history of transient neurological symptoms concerning for demyelinating disease such as multiple sclerosis. However she does also have some stroke risk factors such as hypertension, obesity.    She has frequent transient neurological symptoms of numbness and weakness that are transient and self resolved.  On 04/09/22 she developed sudden onset left upper extremity weakness.  Today she notes the L arm and leg feel back to full strength and sensation.  During stroke workup, noted R brachiocephalic artery clot, now on Heparin IV.  Hypercoag labs still  pending.  Lower dopplers pending.   She has had extensive prior neurological evaluation for abnormal MRI scan showing nonspecific T2/FLAIR white matter hyperintensities raising concern for multiple sclerosis but essentially negative workup for the same including lumbar puncture most recently 05/05/2021 which was normal   Stroke - Multiple bilateral parietal occipital lobe subacute small  infarcts, sembolic pattern, cryptogenic CT head -No acute intracranial abnormality.  CTA head & neck Filling defect in the brachiocephalic  artery, concerning for intraluminal thrombus. MRI  brain- Multiple small areas of diffusion hyperintense signal in the occipital parietal lobes bilaterally. 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 CTA repeat nonocclusive brachiocephalic artery thrombus Echo bubble study EF 55% LE venous doppler no DVT TCD bubble study negative for PFO Will consider TEE to complete the work up post stenting Hypercoagulable and autoimmune work up neg so far, some still pending LDL 68 A1C 6.0 UDS positive for THC No antithrombotics PTA, now in heparin gtt. No additional antiplatelet needed from neuro standpoint for now. Pending stenting Therapy recommendations: none Disposition - pending  Thrombus in brachiocephalic artery noted on CTA.  Started on IV heparin. Lower extremity Doppler studies no DVT  Hypercoag Panel neg so far and ANA pending. Vascular surgery planning for stenting in am Repeat CTA nonocclusive brachiocephalic artery thrombus  ? Concern for MS Due to lesion locations in brain imaging MRI cervical spine-Mild degenerative change in the cervical spine.  MRI thoracic-Mild thoracic degenerative change.  LP: pending OCB's, resulted CSF unremarkable MRI brain WM changes likely ischemic changes  Hypertension Home meds:  amlodipine '5mg'$  stable Long-term BP goal normotensive  Hyperlipidemia Home meds:  none LDL 68, goal < 70 Well controlled  Other Stroke Risk Factors Former smoker, quit 4 years ago Substance abuse - UDS:  THC POSITIVE. Patient advised to stop using due to stroke risk. Obesity, Body mass index is 37.93 kg/m., BMI >/= 30 associated with increased stroke risk, recommend weight loss, diet and exercise as appropriate  ? Obstructive sleep apnea: sleep study completed last night inpatient Migraine history  Other Active Problems Thyroid nodules bil: incidental finding on Cervical MRI, [place] completed  04/10/22.  Biopsy will need to be done in the outpatient setting in the next few weeks.    Hospital day # 3   Rosalin Hawking, MD PhD Stroke Neurology 04/13/2022 1:04 PM    To contact Stroke Continuity provider, please refer to [email or web address] After hours, contact General Neurology

## 2022-04-13 NOTE — Progress Notes (Addendum)
Progress Note    04/13/2022 7:47 AM * No surgery found *  Subjective:  no complaints    Vitals:   04/12/22 2342 04/13/22 0419  BP: (!) 128/90 111/71  Pulse: 86   Resp: 15 18  Temp: 98.2 F (36.8 C) 98.7 F (37.1 C)  SpO2: 98%     Physical Exam: Lungs:  nonlabored Extremities:  palpable radial pulses bilaterally. Strength 5/5 in bilateral upper and lower extremities Neuro: intact, no deficits  CBC    Component Value Date/Time   WBC 7.6 04/13/2022 0258   RBC 4.60 04/13/2022 0258   HGB 10.1 (L) 04/13/2022 0258   HGB 10.6 (L) 01/09/2020 1351   HCT 34.1 (L) 04/13/2022 0258   HCT 32.8 (L) 01/09/2020 1351   PLT 410 (H) 04/13/2022 0258   PLT 436 01/09/2020 1351   MCV 74.1 (L) 04/13/2022 0258   MCV 82 01/09/2020 1351   MCH 22.0 (L) 04/13/2022 0258   MCHC 29.6 (L) 04/13/2022 0258   RDW 16.7 (H) 04/13/2022 0258   RDW 13.8 01/09/2020 1351   LYMPHSABS 1.7 04/12/2022 1202   MONOABS 0.6 04/12/2022 1202   EOSABS 0.1 04/12/2022 1202   BASOSABS 0.0 04/12/2022 1202    BMET    Component Value Date/Time   NA 134 (L) 04/13/2022 0258   NA 140 01/09/2020 1351   K 3.4 (L) 04/13/2022 0258   CL 102 04/13/2022 0258   CO2 21 (L) 04/13/2022 0258   GLUCOSE 97 04/13/2022 0258   BUN 9 04/13/2022 0258   BUN 9 01/09/2020 1351   CREATININE 0.76 04/13/2022 0258   CALCIUM 8.8 (L) 04/13/2022 0258   GFRNONAA >60 04/13/2022 0258   GFRAA 96 01/09/2020 1351    INR    Component Value Date/Time   INR 1.0 04/09/2022 1315    No intake or output data in the 24 hours ending 04/13/22 0747    Assessment/Plan:  47 y.o. female with incidental finding of brachiocephalic artery thrombus   -On IV heparin, tolerating well -Exam unchanged with well perfused upper and lower extremities -No motor or sensory deficits. Patient's right arm soreness has resolved -Repeat CTA redemonstrated nonocclusive brachiocephalic artery thrombus. Pending surgical vs medical management decision  Vicente Serene, [place] [facility] [phone] 04/13/2022 7:47 AM   VASCULAR STAFF ADDENDUM: I have independently interviewed and examined the patient. I agree with the above.  CT angio reviewed.  Poor study, however brachiocephalic thrombus is still present.  I long conversation with both Ethell and her mother regarding continued medical management versus surgical exclusion of the thrombus with stent grafting. At this point, I think medical management will fail long-term as there have been no major changes in the thrombus over the last 48 hours.  Milea is aware surgical intervention carries a stroke risk.  In an effort to mitigate this, I plan on performing a right common carotid artery cutdown and performing the intervention in retrograde fashion.  My plan is to place a covered stent over the lesion in an effort to trap the thrombus in its current location and prevent embolization. After discussing the risk and benefits of the above, [place] elected to proceed.  She is aware that aortic surgery and surgery on its first-order branches can have significant risks and may require sternotomy should complications arise.   Plan for or tomorrow.  Please obtain CBC, BMP N.p.o. midnight  Cassandria Santee, MD [facility] Phone Number: [phone] 04/13/2022 1:08 PM

## 2022-04-13 NOTE — Progress Notes (Addendum)
[facility] PROGRESS NOTE   Connie Herrera MOQ:[id number] DOB: 06-25-1975 DOA: 04/09/2022  PCP: Camillia Herter, NP  Brief History/Interval Summary: 47 y.o. female with a past medical history of essential hypertension, previous history of transient numbness and weakness which have resolved spontaneously.  She has been seen by neurology previously.  Underwent lumbar puncture in January 2023.  She was in her usual state of health till this morning when she went to see her primary care provider.  This was for a routine visit.  She came back to her house and then noticed that her left arm was weak.  She had similar symptoms about a week ago but thought that weakness resolved spontaneously. In the emergency department she underwent MRI brain which showed multiple small areas of diffusion hyperintense signal in the occipital parietal lobes bilaterally.  Chronic changes were also noted.  Etiology for these findings were not clear.  Neurology was consulted.    Consultants: Neurology.  Vascular surgery  Procedures: Lumbar puncture    Subjective/Interval History: Patient mentions that her anxiety level is slightly better.  Still quite upset by her overall situation.  Has not had a bowel movement in a few days.  Denies any pain issues.  Has been having cramps in her legs.    Assessment/Plan:  Acute stroke Presented with left arm weakness.  Per neurology embolic etiology suspected.   There was also concern for demyelinating process.  Patient underwent lumbar puncture.  Underwent MRI brain cervical spine and thoracic spine. Neurology is following. LDL noted to be 68.  HbA1c is 6.0. TSH 2.4.  RPR nonreactive.  HIV nonreactive Echocardiogram shows normal systolic function with grade 1 diastolic dysfunction. No needs identified by physical and Occupational Therapy. TEE is recommended by neurology.  Will first wait and see plan regarding the thrombus in brachiocephalic artery before  proceeding with TEE.  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  Started on IV heparin. CT angiogram was repeated last night.  Continues to show the thrombus. Waiting on vascular surgery input. No bleeding episodes.  Continue heparin.   Lower extremity Doppler study negative for DVT. Hypercoagulable panel was sent by neurology.  Anticardiolipin antibody is still not elevated.  Beta-2 glycoprotein is not elevated.  Protein C is 129 which is in the normal range.  Protein S is also in the normal range.  Antithrombin activity is in the normal range.  No lupus anticoagulant was detected.  Essential hypertension Holding her medications for now.  Permissive hypertension.  Hypokalemia Continue to replace potassium.  Magnesium is 1.8.  Hyponatremia Continue to monitor.  Microcytic anemia/iron deficiency This is as a result of menorrhagia.   Anemia panel shows ferritin of 5, iron of 26, TIBC 533, percent saturation 5.  Vitamin B12 level is 354, folic acid 65.6. Hemoglobin is stable.  Due to all of her acute issues we are holding off on iron infusion for now.  Consider administering prior to discharge.  Left thyroid nodule Imaging studies suggested nodules in both lobes.  Ultrasound was done which revealed a nodule of concern in the left thyroid lobe.  They recommend a biopsy.  TSH is normal. This was communicated to the patient.  We will wait for her acute issues to subside.  Biopsy will need to be done in the outpatient setting in the next few weeks.    Menorrhagia/uterine fibroids Has not followed up with gynecology in more than a year.  She was told  that she would need to do so as soon as possible now that she is on anticoagulants.  This might worsen her menorrhagia.  Situational anxiety and insomnia Continue with the low-dose alprazolam as needed and melatonin for insomnia.  Constipation Start laxatives.  Obesity Estimated body mass index is 37.93  kg/m as calculated from the following:   Height as of this encounter: '5\' 6"'$  (1.676 m).   Weight as of this encounter: 106.6 kg.   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     Medications: Scheduled:  melatonin  3 mg Oral QHS   Continuous:  heparin 1,700 Units/hr (04/13/22 0100)   JJO:[id number] **OR** acetaminophen (TYLENOL) oral liquid 160 mg/5 mL **OR** acetaminophen, ALPRAZolam, gadobutrol, oxyCODONE  Antibiotics: Anti-infectives (From admission, onward)    None       Objective:  Vital Signs  Vitals:   04/12/22 2014 04/12/22 2342 04/13/22 0419 04/13/22 0819  BP: (!) 150/84 (!) 128/90 111/71 (!) 145/105  Pulse: 90 86  88  Resp: '15 15 18 15  '$ Temp: 98 F (36.7 C) 98.2 F (36.8 C) 98.7 F (37.1 C) 97.7 F (36.5 C)  TempSrc: Oral Oral Oral Oral  SpO2: 98% 98%  98%  Weight:      Height:       No intake or output data in the 24 hours ending 04/13/22 0929  Filed Weights   04/09/22 2023  Weight: 106.6 kg    General appearance: Awake alert.  In no distress Resp: Clear to auscultation bilaterally.  Normal effort Cardio: S1-S2 is normal regular.  No S3-S4.  No rubs murmurs or bruit GI: Abdomen is soft.  Nontender nondistended.  Bowel sounds are present normal.  No masses organomegaly Extremities: No edema.  Full range of motion of lower extremities.    Lab Results:  Data Reviewed: I have personally reviewed following labs and reports of the imaging studies  CBC: Recent Labs  Lab 04/09/22 1315 04/09/22 1322 04/10/22 0443 04/11/22 0828 04/12/22 0029 04/12/22 1202 04/13/22 0258  WBC 7.2  --  8.2 7.7 8.6 7.5 7.6  NEUTROABS 5.3  --   --   --   --  5.1  --   HGB 10.1*   < > 10.5* 10.8* 9.9* 10.6* 10.1*  HCT 34.5*   < > 33.9*  34.6* 32.2* 34.1* 34.1*  MCV 76.2*  --  73.1* 72.5* 72.0* 72.6* 74.1*  PLT 424*  --  441* 435* 400 410* 410*   < > = values in this interval not displayed.     Basic Metabolic Panel: Recent Labs  Lab 04/09/22 1315 04/09/22 1322 04/10/22 0443 04/11/22 0828 04/13/22 0258  NA 137 139 133* 135 134*  K 3.2* 3.2* 3.3* 3.4* 3.4*  CL 105 103 101 103 102  CO2 23  --  24 19* 21*  GLUCOSE 100* 97 101* 109* 97  BUN 6 5* 5* 7 9  CREATININE 0.69 0.50 0.68 0.78 0.76  CALCIUM 8.3*  --  8.7* 8.5* 8.8*  MG  --   --  1.8 1.8 1.8     GFR: Estimated Creatinine Clearance: 108.5 mL/min (by C-G formula based on SCr of 0.76 mg/dL).  Liver Function Tests: Recent Labs  Lab 04/09/22 1315 04/10/22 0443  AST 20 22  ALT 24 24  ALKPHOS 76 77  BILITOT <  0.1* 0.4  PROT 6.4* 6.8  ALBUMIN 3.4* 3.3*      Coagulation Profile: Recent Labs  Lab 04/09/22 1315  INR 1.0      CBG: Recent Labs  Lab 04/09/22 1038  GLUCAP 101*      Recent Results (from the past 240 hour(s))  CSF culture w Gram Stain     Status: None   Collection Time: 04/09/22  5:08 PM   Specimen: PATH Cytology CSF; Cerebrospinal Fluid  Result Value Ref Range Status   Specimen Description CSF  Final   Special Requests NONE  Final   Gram Stain NO WBC SEEN NO ORGANISMS SEEN CYTOSPIN SMEAR   Final   Culture   Final    NO GROWTH Performed at [facility], [place] [address], [place], [place] [address]    Report Status 04/12/2022 FINAL  Final  Culture, fungus without smear     Status: None (Preliminary result)   Collection Time: 04/09/22  5:08 PM   Specimen: PATH Cytology CSF; Cerebrospinal Fluid  Result Value Ref Range Status   Specimen Description CSF  Final   Special Requests NONE  Final   Culture   Final    NO FUNGUS ISOLATED AFTER 3 DAYS Performed at [facility] [address] [place], [place] [address]    Report Status PENDING  Incomplete      Radiology Studies: CT ANGIO CHEST AORTA W/CM & OR  WO/CM  Result Date: 04/12/2022 CLINICAL DATA:  Acute CVA. Code stroke. Brachiocephalic thrombus seen on CT 04/09/2022. EXAM: CT ANGIOGRAPHY CHEST WITH CONTRAST TECHNIQUE: Multidetector CT imaging of the chest was performed using the standard protocol during bolus administration of intravenous contrast. Multiplanar CT image reconstructions and MIPs were obtained to evaluate the vascular anatomy. RADIATION DOSE REDUCTION: This exam was performed according to the departmental dose-optimization program which includes automated exposure control, adjustment of the mA and/or kV according to patient size and/or use of iterative reconstruction technique. CONTRAST:  131m OMNIPAQUE IOHEXOL 350 MG/ML SOLN COMPARISON:  CTA head and neck 04/09/2022; chest radiographs 02/15/2022 FINDINGS: Cardiovascular: No aortic intramural hemotoma. Preferential opacification of the thoracic aorta. No evidence of thoracic aortic aneurysm or dissection. Normal heart size. No pericardial effusion. No central pulmonary embolism. Nonocclusive left brachiocephalic artery thrombus was better visualized on CTA head and neck 04/09/2022 as it is largely obscured by streak artifact from dense contrast bolus in the left brachiocephalic vein. No aortic thrombus. The visualized portions of the right carotid and subclavian arteries are patent without thrombus. Mediastinum/Nodes: Left thyroid nodule better evaluated on ultrasound 04/10/2022. See that report for details. Unremarkable esophagus. No thoracic adenopathy by size. Lungs/Pleura: Lungs are clear. No pleural effusion or pneumothorax. Solid 4 mm nodule along the right major fissure (8/69). No routine follow-up imaging is recommended. Upper Abdomen: No acute abnormality. Musculoskeletal: No chest wall abnormality. No acute or significant osseous findings. Review of the MIP images confirms the above findings. IMPRESSION: Redemonstrated nonocclusive left brachiocephalic artery thrombus. This was better  visualized on CTA head and neck 04/09/2022 as it is largely obscured by streak artifact from dense contrast bolus in the left brachiocephalic vein. No additional arterial thrombus is visualized. Electronically Signed   By: TPlacido SouM.D.   On: 04/12/2022 21:50   VAS [place] DOPPL[place] W BUBBLES  Result Date: 04/12/2022  Transcranial Doppler with Bubble Patient Name:  Connie Herrera Date of Exam:   04/11/2022 Medical Rec #: [id number]  Accession #:    [id number] Date of Birth: [birth year]            Patient Gender: F Patient Age:   73 years Exam Location:  [facility] Procedure:      VAS [place] TRANSCRANIAL DOPPLER W BUBBLES Referring Phys: Lesleigh Noe --------------------------------------------------------------------------------  Indications: Stroke. History: Hypertension. History of tobacco use. Frequent transient numbness and weakness. Right brachiocephalic arterial clot by CT. New OSA diagnosis. Comparison Study: No prior study Performing Technologist: Sharion Dove RVS  Examination Guidelines: A complete evaluation includes B-mode imaging, spectral Doppler, color Doppler, and power Doppler as needed of all accessible portions of each vessel. Bilateral testing is considered an integral part of a complete examination. Limited examinations for reoccurring indications may be performed as noted.  Summary: No HITS at rest or during Valsalva. Negative transcranial Doppler Bubble study with no evidence of right to left intracardiac communication.  *See table(s) above for TCD measurements and observations.  Diagnosing physician: Rosalin Hawking MD Electronically signed by Rosalin Hawking MD on 04/12/2022 at 11:38:24 AM.    Final    VAS [place] LOWER EXTREMITY VENOUS (DVT)  Result Date: 04/12/2022  Lower Venous DVT Study Patient Name:  Connie Herrera  Date of Exam:   04/11/2022 Medical Rec #: [id number]             Accession #:    [id number] Date of Birth: 1975-11-03            Patient Gender: F  Patient Age:   28 years Exam Location:  [facility] Procedure:      VAS [place] LOWER EXTREMITY VENOUS (DVT) Referring Phys: Donnetta Simpers --------------------------------------------------------------------------------  Indications: Stroke.  Limitations: Patient unable to tolerate compressions secondary to pain. Comparison Study: Prior negative bilateral LEV done 10/03/20 Performing Technologist: Sharion Dove RVS  Examination Guidelines: A complete evaluation includes B-mode imaging, spectral Doppler, color Doppler, and power Doppler as needed of all accessible portions of each vessel. Bilateral testing is considered an integral part of a complete examination. Limited examinations for reoccurring indications may be performed as noted. The reflux portion of the exam is performed with the patient in reverse Trendelenburg.  +---------+---------------+---------+-----------+----------+-------------------+ RIGHT    CompressibilityPhasicitySpontaneityPropertiesThrombus Aging      +---------+---------------+---------+-----------+----------+-------------------+ CFV      Full                                                             +---------+---------------+---------+-----------+----------+-------------------+ SFJ      Full                                                             +---------+---------------+---------+-----------+----------+-------------------+ FV Prox                 Yes      Yes                  patent by color and  Doppler             +---------+---------------+---------+-----------+----------+-------------------+ FV Mid                  Yes      Yes                  patent by color and                                                       Doppler             +---------+---------------+---------+-----------+----------+-------------------+ FV Distal               Yes      Yes                   patent by color and                                                       Doppler             +---------+---------------+---------+-----------+----------+-------------------+ PFV                                                   Not well visualized +---------+---------------+---------+-----------+----------+-------------------+ POP                     Yes      Yes                  patent by color and                                                       Doppler             +---------+---------------+---------+-----------+----------+-------------------+ PTV      Full                                                             +---------+---------------+---------+-----------+----------+-------------------+ PERO     Full                                                             +---------+---------------+---------+-----------+----------+-------------------+   +---------+---------------+---------+-----------+----------+-------------------+ LEFT     CompressibilityPhasicitySpontaneityPropertiesThrombus Aging      +---------+---------------+---------+-----------+----------+-------------------+ CFV                     Yes      Yes  patent by color and                                                       Doppler             +---------+---------------+---------+-----------+----------+-------------------+ FV Prox                 Yes      Yes                  patent by color and                                                       Doppler             +---------+---------------+---------+-----------+----------+-------------------+ FV Mid                  Yes      Yes                  patent by color and                                                       Doppler             +---------+---------------+---------+-----------+----------+-------------------+ FV Distal               Yes      Yes                   patent by color and                                                       Doppler             +---------+---------------+---------+-----------+----------+-------------------+ PFV                                                   Not well visualized +---------+---------------+---------+-----------+----------+-------------------+ POP                     Yes      Yes                  patent by color and                                                       Doppler             +---------+---------------+---------+-----------+----------+-------------------+ PTV      Full                                                             +---------+---------------+---------+-----------+----------+-------------------+  PERO     Full                                                             +---------+---------------+---------+-----------+----------+-------------------+     Summary: BILATERAL: - No obvious evidence of deep vein thrombosis seen in the visualized veins of the lower extremities, bilaterally. -No evidence of popliteal cyst, bilaterally.   *See table(s) above for measurements and observations. Electronically signed by Orlie Pollen on 04/12/2022 at 7:53:21 AM.    Final        LOS: 3 days   [facility] Pager on [email or web address]  04/13/2022, 9:29 AM

## 2022-04-14 ENCOUNTER — Encounter (HOSPITAL_COMMUNITY): Payer: Self-pay | Admitting: Internal Medicine

## 2022-04-14 ENCOUNTER — Inpatient Hospital Stay (HOSPITAL_COMMUNITY): Payer: BLUE CROSS/BLUE SHIELD | Admitting: Certified Registered"

## 2022-04-14 ENCOUNTER — Other Ambulatory Visit: Payer: Self-pay

## 2022-04-14 ENCOUNTER — Encounter (HOSPITAL_COMMUNITY)
Admission: EM | Disposition: A | Discharge status: Home / Self Care | Payer: Self-pay | Source: Home / Self Care | Attending: Internal Medicine

## 2022-04-14 ENCOUNTER — Inpatient Hospital Stay (HOSPITAL_COMMUNITY): Payer: BLUE CROSS/BLUE SHIELD

## 2022-04-14 DIAGNOSIS — I639 Cerebral infarction, unspecified: Secondary | ICD-10-CM | POA: Diagnosis not present

## 2022-04-14 DIAGNOSIS — Z87891 Personal history of nicotine dependence: Secondary | ICD-10-CM | POA: Diagnosis not present

## 2022-04-14 DIAGNOSIS — I6990 Unspecified sequelae of unspecified cerebrovascular disease: Secondary | ICD-10-CM

## 2022-04-14 DIAGNOSIS — I749 Embolism and thrombosis of unspecified artery: Secondary | ICD-10-CM | POA: Diagnosis not present

## 2022-04-14 DIAGNOSIS — I742 Embolism and thrombosis of arteries of the upper extremities: Secondary | ICD-10-CM | POA: Diagnosis not present

## 2022-04-14 DIAGNOSIS — I119 Hypertensive heart disease without heart failure: Secondary | ICD-10-CM

## 2022-04-14 DIAGNOSIS — E041 Nontoxic single thyroid nodule: Secondary | ICD-10-CM | POA: Diagnosis not present

## 2022-04-14 HISTORY — PX: AORTOGRAM: SHX6300

## 2022-04-14 HISTORY — PX: ENDARTERECTOMY: SHX5162

## 2022-04-14 LAB — BASIC METABOLIC PANEL
Anion gap: 9 (ref 5–15)
BUN: 10 mg/dL (ref 6–20)
CO2: 21 mmol/L — ABNORMAL LOW (ref 22–32)
Calcium: 8.9 mg/dL (ref 8.9–10.3)
Chloride: 103 mmol/L (ref 98–111)
Creatinine, Ser: 0.84 mg/dL (ref 0.44–1.00)
GFR, Estimated: >60 mL/min (ref >60)
Glucose, Bld: 122 mg/dL — ABNORMAL HIGH (ref 70–99)
Potassium: 4.1 mmol/L (ref 3.5–5.1)
Sodium: 133 mmol/L — ABNORMAL LOW (ref 135–145)

## 2022-04-14 LAB — CBC
HCT: 37.3 % (ref 36.0–46.0)
Hemoglobin: 11.2 g/dL — ABNORMAL LOW (ref 12.0–15.0)
MCH: 22.1 pg — ABNORMAL LOW (ref 26.0–34.0)
MCHC: 30 g/dL (ref 30.0–36.0)
MCV: 73.7 fL — ABNORMAL LOW (ref 80.0–100.0)
Platelets: 493 10*3/uL — ABNORMAL HIGH (ref 150–400)
RBC: 5.06 MIL/uL (ref 3.87–5.11)
RDW: 16.9 % — ABNORMAL HIGH (ref 11.5–15.5)
WBC: 7.2 10*3/uL (ref 4.0–10.5)
nRBC: 0 % (ref 0.0–0.2)

## 2022-04-14 LAB — HEPARIN LEVEL (UNFRACTIONATED): Heparin Unfractionated: 0.5 IU/mL (ref 0.30–0.70)

## 2022-04-14 LAB — POCT ACTIVATED CLOTTING TIME: Activated Clotting Time: 266 seconds

## 2022-04-14 LAB — SURGICAL PCR SCREEN
MRSA, PCR: NEGATIVE
Staphylococcus aureus: NEGATIVE

## 2022-04-14 SURGERY — ENDARTERECTOMY, CAROTID
Anesthesia: General | Site: Neck | Laterality: Right

## 2022-04-14 MED ORDER — FENTANYL CITRATE (PF) 100 MCG/2ML IJ SOLN
INTRAMUSCULAR | Status: AC
  Filled 2022-04-14: qty 2

## 2022-04-14 MED ORDER — ESMOLOL HCL 100 MG/10ML IV SOLN
INTRAVENOUS | Status: AC
  Filled 2022-04-14: qty 10

## 2022-04-14 MED ORDER — FENTANYL CITRATE (PF) 100 MCG/2ML IJ SOLN
25.0000 ug | INTRAMUSCULAR | Status: DC | PRN
  Administered 2022-04-14 (×3): 50 ug via INTRAVENOUS

## 2022-04-14 MED ORDER — LABETALOL HCL 5 MG/ML IV SOLN
10.0000 mg | Freq: Once | INTRAVENOUS | Status: AC
  Administered 2022-04-14: 10 mg via INTRAVENOUS

## 2022-04-14 MED ORDER — HYDROMORPHONE HCL 1 MG/ML IJ SOLN
INTRAMUSCULAR | Status: AC
  Filled 2022-04-14: qty 1

## 2022-04-14 MED ORDER — PHENOL 1.4 % MT LIQD: 1.0000 | OROMUCOSAL | Status: DC | PRN

## 2022-04-14 MED ORDER — LIDOCAINE HCL (PF) 1 % IJ SOLN
INTRAMUSCULAR | Status: AC
  Filled 2022-04-14: qty 30

## 2022-04-14 MED ORDER — DEXAMETHASONE SODIUM PHOSPHATE 10 MG/ML IJ SOLN
INTRAMUSCULAR | Status: DC | PRN
  Administered 2022-04-14: 10 mg via INTRAVENOUS

## 2022-04-14 MED ORDER — HYDRALAZINE HCL 20 MG/ML IJ SOLN: 5.0000 mg | INTRAMUSCULAR | Status: DC | PRN

## 2022-04-14 MED ORDER — HEPARIN 6000 UNIT IRRIGATION SOLUTION
Status: DC | PRN
  Administered 2022-04-14: 1

## 2022-04-14 MED ORDER — ASPIRIN 81 MG PO TBEC
81.0000 mg | DELAYED_RELEASE_TABLET | Freq: Every day | ORAL | Status: DC
  Administered 2022-04-15 – 2022-04-17 (×3): 81 mg via ORAL
  Filled 2022-04-14 (×3): qty 1

## 2022-04-14 MED ORDER — SODIUM CHLORIDE 0.9 % IV SOLN
25.0000 mg | Freq: Four times a day (QID) | INTRAVENOUS | Status: DC | PRN
  Administered 2022-04-14: 25 mg via INTRAVENOUS
  Filled 2022-04-14 (×2): qty 1

## 2022-04-14 MED ORDER — PROPOFOL 10 MG/ML IV BOLUS
INTRAVENOUS | Status: AC
  Filled 2022-04-14: qty 20

## 2022-04-14 MED ORDER — FENTANYL CITRATE (PF) 250 MCG/5ML IJ SOLN
INTRAMUSCULAR | Status: AC
  Filled 2022-04-14: qty 5

## 2022-04-14 MED ORDER — ALUM & MAG HYDROXIDE-SIMETH 200-200-20 MG/5ML PO SUSP: 15.0000 mL | ORAL | Status: DC | PRN

## 2022-04-14 MED ORDER — HYDROMORPHONE HCL 1 MG/ML IJ SOLN
0.2500 mg | INTRAMUSCULAR | Status: DC | PRN
  Administered 2022-04-14 (×3): 0.5 mg via INTRAVENOUS

## 2022-04-14 MED ORDER — HEPARIN 6000 UNIT IRRIGATION SOLUTION
Status: AC
  Filled 2022-04-14: qty 500

## 2022-04-14 MED ORDER — HEMOSTATIC AGENTS (NO CHARGE) OPTIME
TOPICAL | Status: DC | PRN
  Administered 2022-04-14: 1 via TOPICAL

## 2022-04-14 MED ORDER — METOPROLOL TARTRATE 5 MG/5ML IV SOLN: 2.0000 mg | INTRAVENOUS | Status: DC | PRN

## 2022-04-14 MED ORDER — FENTANYL CITRATE (PF) 250 MCG/5ML IJ SOLN
INTRAMUSCULAR | Status: DC | PRN
  Administered 2022-04-14: 150 ug via INTRAVENOUS
  Administered 2022-04-14: 100 ug via INTRAVENOUS
  Administered 2022-04-14: 50 ug via INTRAVENOUS

## 2022-04-14 MED ORDER — MIDAZOLAM HCL 2 MG/2ML IJ SOLN
INTRAMUSCULAR | Status: AC
  Filled 2022-04-14: qty 2

## 2022-04-14 MED ORDER — ROCURONIUM BROMIDE 10 MG/ML (PF) SYRINGE
PREFILLED_SYRINGE | INTRAVENOUS | Status: AC
  Filled 2022-04-14: qty 10

## 2022-04-14 MED ORDER — ONDANSETRON HCL 4 MG/2ML IJ SOLN
INTRAMUSCULAR | Status: AC
  Filled 2022-04-14: qty 2

## 2022-04-14 MED ORDER — CHLORHEXIDINE GLUCONATE 0.12 % MT SOLN: 15.0000 mL | Freq: Once | OROMUCOSAL | Status: AC

## 2022-04-14 MED ORDER — ONDANSETRON HCL 4 MG/2ML IJ SOLN
INTRAMUSCULAR | Status: DC | PRN
  Administered 2022-04-14: 4 mg via INTRAVENOUS

## 2022-04-14 MED ORDER — SODIUM CHLORIDE 0.9 % IV SOLN: INTRAVENOUS | Status: DC

## 2022-04-14 MED ORDER — POTASSIUM CHLORIDE CRYS ER 20 MEQ PO TBCR: 20.0000 meq | EXTENDED_RELEASE_TABLET | Freq: Every day | ORAL | Status: DC | PRN

## 2022-04-14 MED ORDER — ONDANSETRON HCL 4 MG/2ML IJ SOLN
4.0000 mg | Freq: Once | INTRAMUSCULAR | Status: AC | PRN
  Administered 2022-04-14: 4 mg via INTRAVENOUS

## 2022-04-14 MED ORDER — ESMOLOL HCL 100 MG/10ML IV SOLN
INTRAVENOUS | Status: DC | PRN
  Administered 2022-04-14: 30 mg via INTRAVENOUS

## 2022-04-14 MED ORDER — SUGAMMADEX SODIUM 200 MG/2ML IV SOLN
INTRAVENOUS | Status: DC | PRN
  Administered 2022-04-14: 250 mg via INTRAVENOUS

## 2022-04-14 MED ORDER — OXYCODONE HCL 5 MG PO TABS: 5.0000 mg | ORAL_TABLET | Freq: Once | ORAL | Status: DC | PRN

## 2022-04-14 MED ORDER — LABETALOL HCL 5 MG/ML IV SOLN: 10.0000 mg | INTRAVENOUS | Status: DC | PRN

## 2022-04-14 MED ORDER — GUAIFENESIN-DM 100-10 MG/5ML PO SYRP: 15.0000 mL | ORAL_SOLUTION | ORAL | Status: DC | PRN

## 2022-04-14 MED ORDER — PHENYLEPHRINE 80 MCG/ML (10ML) SYRINGE FOR IV PUSH (FOR BLOOD PRESSURE SUPPORT)
PREFILLED_SYRINGE | INTRAVENOUS | Status: DC | PRN
  Administered 2022-04-14 (×3): 200 ug via INTRAVENOUS
  Administered 2022-04-14: 160 ug via INTRAVENOUS

## 2022-04-14 MED ORDER — STERILE WATER FOR IRRIGATION IR SOLN
Status: DC | PRN
  Administered 2022-04-14: 1000 mL

## 2022-04-14 MED ORDER — LIDOCAINE-EPINEPHRINE (PF) 1 %-1:200000 IJ SOLN
INTRAMUSCULAR | Status: AC
  Filled 2022-04-14: qty 30

## 2022-04-14 MED ORDER — MIDAZOLAM HCL 2 MG/2ML IJ SOLN
INTRAMUSCULAR | Status: DC | PRN
  Administered 2022-04-14: 2 mg via INTRAVENOUS

## 2022-04-14 MED ORDER — ACETAMINOPHEN 160 MG/5ML PO SOLN: 325.0000 mg | ORAL | Status: DC | PRN

## 2022-04-14 MED ORDER — MAGNESIUM SULFATE 2 GM/50ML IV SOLN: 2.0000 g | Freq: Every day | INTRAVENOUS | Status: DC | PRN

## 2022-04-14 MED ORDER — PANTOPRAZOLE SODIUM 40 MG PO TBEC
40.0000 mg | DELAYED_RELEASE_TABLET | Freq: Every day | ORAL | Status: DC
  Administered 2022-04-15 – 2022-04-17 (×3): 40 mg via ORAL
  Filled 2022-04-14 (×3): qty 1

## 2022-04-14 MED ORDER — ORAL CARE MOUTH RINSE: 15.0000 mL | Freq: Once | OROMUCOSAL | Status: AC

## 2022-04-14 MED ORDER — HEPARIN (PORCINE) 25000 UT/250ML-% IV SOLN
1650.0000 [IU]/h | INTRAVENOUS | Status: DC
  Administered 2022-04-14 – 2022-04-15 (×3): 1650 [IU]/h via INTRAVENOUS
  Filled 2022-04-14 (×2): qty 250

## 2022-04-14 MED ORDER — ACETAMINOPHEN 325 MG PO TABS: 325.0000 mg | ORAL_TABLET | ORAL | Status: DC | PRN

## 2022-04-14 MED ORDER — SODIUM CHLORIDE 0.9 % IV SOLN: 500.0000 mL | Freq: Once | INTRAVENOUS | Status: DC | PRN

## 2022-04-14 MED ORDER — CEFAZOLIN SODIUM 1 G IJ SOLR
INTRAMUSCULAR | Status: AC
  Filled 2022-04-14: qty 20

## 2022-04-14 MED ORDER — MEPERIDINE HCL 25 MG/ML IJ SOLN: 6.2500 mg | INTRAMUSCULAR | Status: DC | PRN

## 2022-04-14 MED ORDER — MORPHINE SULFATE (PF) 2 MG/ML IV SOLN
2.0000 mg | INTRAVENOUS | Status: DC | PRN
  Administered 2022-04-14 – 2022-04-15 (×6): 2 mg via INTRAVENOUS
  Filled 2022-04-14 (×6): qty 1

## 2022-04-14 MED ORDER — LIDOCAINE 2% (20 MG/ML) 5 ML SYRINGE
INTRAMUSCULAR | Status: DC | PRN
  Administered 2022-04-14: 80 mg via INTRAVENOUS

## 2022-04-14 MED ORDER — ROCURONIUM BROMIDE 10 MG/ML (PF) SYRINGE
PREFILLED_SYRINGE | INTRAVENOUS | Status: DC | PRN
  Administered 2022-04-14: 100 mg via INTRAVENOUS
  Administered 2022-04-14: 50 mg via INTRAVENOUS

## 2022-04-14 MED ORDER — LIDOCAINE 2% (20 MG/ML) 5 ML SYRINGE
INTRAMUSCULAR | Status: AC
  Filled 2022-04-14: qty 5

## 2022-04-14 MED ORDER — LABETALOL HCL 5 MG/ML IV SOLN
INTRAVENOUS | Status: AC
  Filled 2022-04-14: qty 4

## 2022-04-14 MED ORDER — 0.9 % SODIUM CHLORIDE (POUR BTL) OPTIME
TOPICAL | Status: DC | PRN
  Administered 2022-04-14 (×2): 1000 mL

## 2022-04-14 MED ORDER — IODIXANOL 320 MG/ML IV SOLN
INTRAVENOUS | Status: DC | PRN
  Administered 2022-04-14 (×2): 25 mL via INTRA_ARTERIAL

## 2022-04-14 MED ORDER — PHENYLEPHRINE HCL-NACL 20-0.9 MG/250ML-% IV SOLN
INTRAVENOUS | Status: DC | PRN
  Administered 2022-04-14: 50 ug/min via INTRAVENOUS

## 2022-04-14 MED ORDER — LACTATED RINGERS IV SOLN: INTRAVENOUS | Status: DC

## 2022-04-14 MED ORDER — CEFAZOLIN SODIUM-DEXTROSE 2-3 GM-%(50ML) IV SOLR
INTRAVENOUS | Status: DC | PRN
  Administered 2022-04-14: 2 g via INTRAVENOUS

## 2022-04-14 MED ORDER — DOCUSATE SODIUM 100 MG PO CAPS
100.0000 mg | ORAL_CAPSULE | Freq: Every day | ORAL | Status: DC
  Administered 2022-04-15 – 2022-04-17 (×3): 100 mg via ORAL
  Filled 2022-04-14 (×5): qty 1

## 2022-04-14 MED ORDER — CEFAZOLIN SODIUM-DEXTROSE 2-4 GM/100ML-% IV SOLN
2.0000 g | Freq: Three times a day (TID) | INTRAVENOUS | Status: AC
  Administered 2022-04-14 – 2022-04-15 (×2): 2 g via INTRAVENOUS
  Filled 2022-04-14 (×2): qty 100

## 2022-04-14 MED ORDER — HEPARIN SODIUM (PORCINE) 1000 UNIT/ML IJ SOLN
INTRAMUSCULAR | Status: DC | PRN
  Administered 2022-04-14: 2000 [IU] via INTRAVENOUS
  Administered 2022-04-14: 10000 [IU] via INTRAVENOUS

## 2022-04-14 MED ORDER — ONDANSETRON HCL 4 MG/2ML IJ SOLN
4.0000 mg | Freq: Four times a day (QID) | INTRAMUSCULAR | Status: DC | PRN
  Administered 2022-04-14: 4 mg via INTRAVENOUS
  Filled 2022-04-14: qty 2

## 2022-04-14 MED ORDER — PROPOFOL 10 MG/ML IV BOLUS
INTRAVENOUS | Status: DC | PRN
  Administered 2022-04-14: 160 mg via INTRAVENOUS
  Administered 2022-04-14: 40 mg via INTRAVENOUS

## 2022-04-14 MED ORDER — OXYCODONE HCL 5 MG/5ML PO SOLN: 5.0000 mg | Freq: Once | ORAL | Status: DC | PRN

## 2022-04-14 MED ORDER — CHLORHEXIDINE GLUCONATE 0.12 % MT SOLN
OROMUCOSAL | Status: AC
  Administered 2022-04-14: 15 mL via OROMUCOSAL
  Filled 2022-04-14: qty 15

## 2022-04-14 SURGICAL SUPPLY — 73 items
ADH SKN CLS APL DERMABOND .7 (GAUZE/BANDAGES/DRESSINGS) ×4
BAG COUNTER SPONGE SURGICOUNT (BAG) ×2 IMPLANT
BAG SNAP BAND KOVER 36X36 (MISCELLANEOUS) IMPLANT
BAG SPNG CNTER NS LX DISP (BAG) ×2
CANISTER SUCT 3000ML PPV (MISCELLANEOUS) ×2 IMPLANT
CANNULA VESSEL 3MM 2 BLNT TIP (CANNULA) ×2 IMPLANT
CATH BEACON 5 .035 40 KMP TP (CATHETERS) IMPLANT
CATH BEACON 5 .038 40 KMP TP (CATHETERS) ×2
CATH OMNI FLUSH .035X70CM (CATHETERS) IMPLANT
CATH ROBINSON RED A/P 18FR (CATHETERS) ×2 IMPLANT
CLIP LIGATING EXTRA MED SLVR (CLIP) ×2 IMPLANT
CLIP LIGATING EXTRA SM BLUE (MISCELLANEOUS) ×2 IMPLANT
COVER PROBE W GEL 5X96 (DRAPES) ×2 IMPLANT
COVER TRANSDUCER ULTRASND GEL (DISPOSABLE) ×2 IMPLANT
DERMABOND ADVANCED .7 DNX12 (GAUZE/BANDAGES/DRESSINGS) ×2 IMPLANT
DRAIN CHANNEL 15F RND FF W/TCR (WOUND CARE) IMPLANT
DRAPE C-ARM 42X72 X-RAY (DRAPES) IMPLANT
DRAPE INCISE IOBAN 66X45 STRL (DRAPES) IMPLANT
DRAPE ORTHO SPLIT 77X108 STRL (DRAPES) ×2
DRAPE PERI GROIN 82X75IN TIB (DRAPES) IMPLANT
DRAPE SURG ORHT 6 SPLT 77X108 (DRAPES) IMPLANT
DRAPE TABLE BACK 80X90 (DRAPES) IMPLANT
ELECT REM PT RETURN 9FT ADLT (ELECTROSURGICAL) ×2
ELECTRODE REM PT RTRN 9FT ADLT (ELECTROSURGICAL) ×2 IMPLANT
EVACUATOR SILICONE 100CC (DRAIN) IMPLANT
GLIDEWIRE ADV .035X180CM (WIRE) IMPLANT
GLIDEWIRE ADV .035X260CM (WIRE) IMPLANT
GLOVE BIOGEL PI IND STRL 8 (GLOVE) ×2 IMPLANT
GOWN STRL REUS W/ TWL LRG LVL3 (GOWN DISPOSABLE) ×4 IMPLANT
GOWN STRL REUS W/TWL 2XL LVL3 (GOWN DISPOSABLE) ×4 IMPLANT
GOWN STRL REUS W/TWL LRG LVL3 (GOWN DISPOSABLE) ×4
HEMOSTAT SNOW SURGICEL 2X4 (HEMOSTASIS) IMPLANT
IV ADAPTER SYR DOUBLE MALE LL (MISCELLANEOUS) IMPLANT
IV ADPR TBG 2 MALE LL ART (MISCELLANEOUS)
KIT BASIN OR (CUSTOM PROCEDURE TRAY) ×2 IMPLANT
KIT ENCORE 26 ADVANTAGE (KITS) IMPLANT
KIT SHUNT ARGYLE CAROTID ART 6 (VASCULAR PRODUCTS) IMPLANT
KIT TURNOVER KIT B (KITS) ×2 IMPLANT
LOOP VESSEL MINI RED (MISCELLANEOUS) IMPLANT
NDL HYPO 25GX1X1/2 BEV (NEEDLE) IMPLANT
NDL SPNL 20GX3.5 QUINCKE YW (NEEDLE) IMPLANT
NEEDLE HYPO 25GX1X1/2 BEV (NEEDLE) IMPLANT
NEEDLE SPNL 20GX3.5 QUINCKE YW (NEEDLE) IMPLANT
NS IRRIG 1000ML POUR BTL (IV SOLUTION) ×6 IMPLANT
PACK CAROTID (CUSTOM PROCEDURE TRAY) ×2 IMPLANT
PAD ARMBOARD 7.5X6 YLW CONV (MISCELLANEOUS) ×4 IMPLANT
POSITIONER HEAD DONUT 9IN (MISCELLANEOUS) ×2 IMPLANT
SET MICROPUNCTURE 5F STIFF (MISCELLANEOUS) IMPLANT
SET WALTER ACTIVATION W/DRAPE (SET/KITS/TRAYS/PACK) ×2 IMPLANT
SHUNT CAROTID BYPASS 10 (VASCULAR PRODUCTS) IMPLANT
SHUNT CAROTID BYPASS 12FRX15.5 (VASCULAR PRODUCTS) IMPLANT
SPONGE SURGIFOAM ABS GEL 100 (HEMOSTASIS) IMPLANT
STENT VIABAHN VBX 11X39X80 (Permanent Stent) IMPLANT
STOPCOCK MORSE 400PSI 3WAY (MISCELLANEOUS) IMPLANT
SUT MNCRL AB 4-0 PS2 18 (SUTURE) ×2 IMPLANT
SUT PROLENE 5 0 C 1 24 (SUTURE) IMPLANT
SUT PROLENE 6 0 BV (SUTURE) ×2 IMPLANT
SUT SILK 2 0 SH (SUTURE) IMPLANT
SUT VIC AB 2-0 CT1 27 (SUTURE) ×2
SUT VIC AB 2-0 CT1 TAPERPNT 27 (SUTURE) ×2 IMPLANT
SUT VIC AB 3-0 SH 27 (SUTURE) ×2
SUT VIC AB 3-0 SH 27X BRD (SUTURE) ×2 IMPLANT
SYR 10ML LL (SYRINGE) IMPLANT
SYR 20CC LL (SYRINGE) ×4 IMPLANT
SYR 20ML LL LF (SYRINGE) IMPLANT
SYR CONTROL 10ML LL (SYRINGE) IMPLANT
SYSTEM TRANSCAROTID NEUROPRTCT (MISCELLANEOUS) IMPLANT
TOWEL GREEN STERILE (TOWEL DISPOSABLE) ×2 IMPLANT
TRANSCAROTID NEUROPROTECT SYS (MISCELLANEOUS) ×2
TUBING INJECTOR 48 (MISCELLANEOUS) IMPLANT
WATER STERILE IRR 1000ML POUR (IV SOLUTION) ×2 IMPLANT
WIRE BENTSON .035X145CM (WIRE) IMPLANT
WIRE TORQFLEX AUST .018X40CM (WIRE) IMPLANT

## 2022-04-14 NOTE — Progress Notes (Signed)
[facility] PROGRESS NOTE   Connie Herrera ZSW:[id number] DOB: 07/12/1975 DOA: 04/09/2022  PCP: Camillia Herter, NP  Brief History/Interval Summary: 47 y.o. female with a past medical history of essential hypertension, previous history of transient numbness and weakness which have resolved spontaneously.  She has been seen by neurology previously.  Underwent lumbar puncture in January 2023.  She was in her usual state of health till this morning when she went to see her primary care provider.  This was for a routine visit.  She came back to her house and then noticed that her left arm was weak.  She had similar symptoms about a week ago but thought that weakness resolved spontaneously. In the emergency department she underwent MRI brain which showed multiple small areas of diffusion hyperintense signal in the occipital parietal lobes bilaterally.  Chronic changes were also noted.  Etiology for these findings were not clear.  Neurology was consulted.    Consultants: Neurology.  Vascular surgery  Procedures: Lumbar puncture    Subjective/Interval History: Patient waiting on surgery this morning.  Her family members are in the room with her.  She mentions that she slept well overnight.  Denies any chest pain or shortness of breath.    Assessment/Plan:  Acute stroke Presented with left arm weakness.  Per neurology embolic etiology suspected.   There was also concern for demyelinating process.  Patient underwent lumbar puncture.  Underwent MRI brain cervical spine and thoracic spine. Neurology is following. LDL noted to be 68.  HbA1c is 6.0. TSH 2.4.  RPR nonreactive.  HIV nonreactive Echocardiogram shows normal systolic function with grade 1 diastolic dysfunction. No needs identified by physical and Occupational Therapy. TEE is recommended by neurology.  Have reached out to cardiology.  Patient undergo vascular procedure today.  If she remains stable TEE can be done  tomorrow.  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  Started on IV heparin. CT angiogram was repeated.  Continues to show the thrombus. Plan is for surgical intervention this morning.  Vascular surgery is following. No bleeding episodes.  Continue heparin as per vascular surgery. Lower extremity Doppler study negative for DVT. Hypercoagulable panel was sent by neurology.  Anticardiolipin antibody is still not elevated.  Beta-2 glycoprotein is not elevated.  Protein C is 129 which is in the normal range.  Protein S is also in the normal range.  Antithrombin activity is in the normal range.  No lupus anticoagulant was detected.  Essential hypertension Holding her medications for now.  Blood pressure is reasonably well-controlled.  Hypokalemia Potassium is normal today.  Magnesium was 1.8 yesterday.    Hyponatremia Stable.  Continue to monitor.  Microcytic anemia/iron deficiency This is as a result of menorrhagia.   Anemia panel shows ferritin of 5, iron of 26, TIBC 533, percent saturation 5.  Vitamin B12 level is 322, folic acid 02.5. Hemoglobin is stable.  Due to her acute issues we are holding off on iron infusion for now.  Consider administering prior to discharge.  Left thyroid nodule Imaging studies suggested nodules in both lobes.  Ultrasound was done which revealed a nodule of concern in the left thyroid lobe.  They recommend a biopsy.  TSH is normal. This was communicated to the patient.  We will wait for her acute issues to subside.  Biopsy will need to be done in the outpatient setting in the next few weeks.    Menorrhagia/uterine fibroids Has not followed up with  gynecology in more than a year.  Patient encouraged to follow-up with her gynecologist.  If she does get placed on oral anticoagulants at discharge then it would increase her risk of significant menorrhagia.  Situational anxiety and insomnia Continue with the low-dose alprazolam as  needed and melatonin for insomnia.  Constipation Continue with laxatives.  Obesity Estimated body mass index is 37.93 kg/m as calculated from the following:   Height as of this encounter: '5\' 6"'$  (1.676 m).   Weight as of this encounter: 106.6 kg.   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     Medications: Scheduled:  melatonin  3 mg Oral QHS   polyethylene glycol  17 g Oral Daily   senna-docusate  2 tablet Oral BID   Continuous:  heparin 1,650 Units/hr (04/14/22 0805)   VZD:GLOVFIEPPIRJJ **OR** acetaminophen (TYLENOL) oral liquid 160 mg/5 mL **OR** acetaminophen, ALPRAZolam, gadobutrol, oxyCODONE  Antibiotics: Anti-infectives (From admission, onward)    None       Objective:  Vital Signs  Vitals:   04/13/22 2040 04/13/22 2358 04/14/22 0506 04/14/22 0720  BP:  108/68 115/77 136/82  Pulse: 91 77 84 84  Resp: '20 16 18 19  '$ Temp:  98.3 F (36.8 C) 98.6 F (37 C) 98.1 F (36.7 C)  TempSrc:  Oral Oral Oral  SpO2: 99% 100% 99% 92%  Weight:      Height:        Intake/Output Summary (Last 24 hours) at 04/14/2022 0852 Last data filed at 04/14/2022 0730 Gross per 24 hour  Intake 1196.68 ml  Output --  Net 1196.68 ml    Filed Weights   04/09/22 2023  Weight: 106.6 kg    General appearance: Awake alert.  In no distress Resp: Clear to auscultation bilaterally.  Normal effort Cardio: S1-S2 is normal regular.  No S3-S4.  No rubs murmurs or bruit GI: Abdomen is soft.  Nontender nondistended.  Bowel sounds are present normal.  No masses organomegaly Extremities: No edema.  Full range of motion of lower extremities. Neurologic: Alert and oriented x3.  No focal neurological deficits.     Lab Results:  Data Reviewed: I have personally reviewed  following labs and reports of the imaging studies  CBC: Recent Labs  Lab 04/09/22 1315 04/09/22 1322 04/11/22 0828 04/12/22 0029 04/12/22 1202 04/13/22 0258 04/14/22 0545  WBC 7.2   < > 7.7 8.6 7.5 7.6 7.2  NEUTROABS 5.3  --   --   --  5.1  --   --   HGB 10.1*   < > 10.8* 9.9* 10.6* 10.1* 11.2*  HCT 34.5*   < > 34.6* 32.2* 34.1* 34.1* 37.3  MCV 76.2*   < > 72.5* 72.0* 72.6* 74.1* 73.7*  PLT 424*   < > 435* 400 410* 410* 493*   < > = values in this interval not displayed.     Basic Metabolic Panel: Recent Labs  Lab 04/09/22 1315 04/09/22 1322 04/10/22 0443 04/11/22 0828 04/13/22 0258 04/14/22 0545  NA 137 139 133* 135 134* 133*  K 3.2* 3.2* 3.3* 3.4* 3.4* 4.1  CL 105 103 101 103 102 103  CO2 23  --  24 19* 21* 21*  GLUCOSE 100* 97 101* 109* 97 122*  BUN 6 5* 5* '7 9 10  '$ CREATININE 0.69 0.50 0.68 0.78 0.76  0.84  CALCIUM 8.3*  --  8.7* 8.5* 8.8* 8.9  MG  --   --  1.8 1.8 1.8  --      GFR: Estimated Creatinine Clearance: 103.3 mL/min (by C-G formula based on SCr of 0.84 mg/dL).  Liver Function Tests: Recent Labs  Lab 04/09/22 1315 04/10/22 0443  AST 20 22  ALT 24 24  ALKPHOS 76 77  BILITOT <0.1* 0.4  PROT 6.4* 6.8  ALBUMIN 3.4* 3.3*      Coagulation Profile: Recent Labs  Lab 04/09/22 1315  INR 1.0      CBG: Recent Labs  Lab 04/09/22 1038  GLUCAP 101*      Recent Results (from the past 240 hour(s))  CSF culture w Gram Stain     Status: None   Collection Time: 04/09/22  5:08 PM   Specimen: PATH Cytology CSF; Cerebrospinal Fluid  Result Value Ref Range Status   Specimen Description CSF  Final   Special Requests NONE  Final   Gram Stain NO WBC SEEN NO ORGANISMS SEEN CYTOSPIN SMEAR   Final   Culture   Final    NO GROWTH Performed at [facility], [place] [address] [place], [place] [address]    Report Status 04/12/2022 FINAL  Final  Culture, fungus without smear     Status: None (Preliminary result)   Collection Time: 04/09/22   5:08 PM   Specimen: PATH Cytology CSF; Cerebrospinal Fluid  Result Value Ref Range Status   Specimen Description CSF  Final   Special Requests NONE  Final   Culture   Final    No Fungi Isolated in 4 Weeks Performed at [facility] [address] [place], [place] [address]    Report Status PENDING  Incomplete  Surgical pcr screen     Status: None   Collection Time: 04/14/22  6:20 AM   Specimen: Nasal Mucosa; Nasal Swab  Result Value Ref Range Status   MRSA, PCR NEGATIVE NEGATIVE Final   Staphylococcus aureus NEGATIVE NEGATIVE Final    Comment: (NOTE) The Xpert SA Assay (FDA approved for NASAL specimens in patients 70 years of age and older), is one component of a comprehensive surveillance program. It is not intended to diagnose infection nor to guide or monitor treatment. Performed at [facility], [place] [address] [place], [place] [address]       Radiology Studies: CT ANGIO CHEST AORTA W/CM & OR WO/CM  Result Date: 04/12/2022 CLINICAL DATA:  Acute CVA. Code stroke. Brachiocephalic thrombus seen on CT 04/09/2022. EXAM: CT ANGIOGRAPHY CHEST WITH CONTRAST TECHNIQUE: Multidetector CT imaging of the chest was performed using the standard protocol during bolus administration of intravenous contrast. Multiplanar CT image reconstructions and MIPs were obtained to evaluate the vascular anatomy. RADIATION DOSE REDUCTION: This exam was performed according to the departmental dose-optimization program which includes automated exposure control, adjustment of the mA and/or kV according to patient size and/or use of iterative reconstruction technique. CONTRAST:  138m OMNIPAQUE IOHEXOL 350 MG/ML SOLN COMPARISON:  CTA head and neck 04/09/2022; chest radiographs 02/15/2022 FINDINGS: Cardiovascular: No aortic intramural hemotoma. Preferential opacification of the thoracic aorta. No evidence of thoracic aortic aneurysm or dissection. Normal heart size. No pericardial effusion. No central  pulmonary embolism. Nonocclusive left brachiocephalic artery thrombus was better visualized on CTA head and neck 04/09/2022 as it is largely obscured by streak artifact from dense contrast bolus in the left brachiocephalic vein. No aortic thrombus. The visualized portions of the right  carotid and subclavian arteries are patent without thrombus. Mediastinum/Nodes: Left thyroid nodule better evaluated on ultrasound 04/10/2022. See that report for details. Unremarkable esophagus. No thoracic adenopathy by size. Lungs/Pleura: Lungs are clear. No pleural effusion or pneumothorax. Solid 4 mm nodule along the right major fissure (8/69). No routine follow-up imaging is recommended. Upper Abdomen: No acute abnormality. Musculoskeletal: No chest wall abnormality. No acute or significant osseous findings. Review of the MIP images confirms the above findings. IMPRESSION: Redemonstrated nonocclusive left brachiocephalic artery thrombus. This was better visualized on CTA head and neck 04/09/2022 as it is largely obscured by streak artifact from dense contrast bolus in the left brachiocephalic vein. No additional arterial thrombus is visualized. Electronically Signed   By: Placido Sou M.D.   On: 04/12/2022 21:50       LOS: 4 days   [facility] Pager on [email or web address]  04/14/2022, 8:52 AM

## 2022-04-14 NOTE — Progress Notes (Signed)
  Progress Note    04/14/2022 9:01 AM Day of Surgery  Subjective:  no complaints    Vitals:   04/14/22 0506 04/14/22 0720  BP: 115/77 136/82  Pulse: 84 84  Resp: 18 19  Temp: 98.6 F (37 C) 98.1 F (36.7 C)  SpO2: 99% 92%    Physical Exam: Lungs:  nonlabored Extremities:  palpable radial pulses bilaterally. Strength 5/5 in bilateral upper and lower extremities Neuro: intact, no deficits  CBC    Component Value Date/Time   WBC 7.2 04/14/2022 0545   RBC 5.06 04/14/2022 0545   HGB 11.2 (L) 04/14/2022 0545   HGB 10.6 (L) 01/09/2020 1351   HCT 37.3 04/14/2022 0545   HCT 32.8 (L) 01/09/2020 1351   PLT 493 (H) 04/14/2022 0545   PLT 436 01/09/2020 1351   MCV 73.7 (L) 04/14/2022 0545   MCV 82 01/09/2020 1351   MCH 22.1 (L) 04/14/2022 0545   MCHC 30.0 04/14/2022 0545   RDW 16.9 (H) 04/14/2022 0545   RDW 13.8 01/09/2020 1351   LYMPHSABS 1.7 04/12/2022 1202   MONOABS 0.6 04/12/2022 1202   EOSABS 0.1 04/12/2022 1202   BASOSABS 0.0 04/12/2022 1202    BMET    Component Value Date/Time   NA 133 (L) 04/14/2022 0545   NA 140 01/09/2020 1351   K 4.1 04/14/2022 0545   CL 103 04/14/2022 0545   CO2 21 (L) 04/14/2022 0545   GLUCOSE 122 (H) 04/14/2022 0545   BUN 10 04/14/2022 0545   BUN 9 01/09/2020 1351   CREATININE 0.84 04/14/2022 0545   CALCIUM 8.9 04/14/2022 0545   GFRNONAA >60 04/14/2022 0545   GFRAA 96 01/09/2020 1351    INR    Component Value Date/Time   INR 1.0 04/09/2022 1315     Intake/Output Summary (Last 24 hours) at 04/14/2022 0901 Last data filed at 04/14/2022 0730 Gross per 24 hour  Intake 361.22 ml  Output --  Net 361.22 ml      Assessment/Plan:  47 y.o. female with incidental finding of brachiocephalic artery thrombus   CT angio reviewed.  Poor study, however brachiocephalic thrombus is still present.  I long conversation with both Mariella and her mother regarding continued medical management versus surgical exclusion of the thrombus with  stent grafting. At this point, I think medical management will fail long-term as there have been no major changes in the thrombus over the last 48 hours.  Caitriona is aware surgical intervention carries a stroke risk.  In an effort to mitigate this, I plan on performing a right common carotid artery cutdown and performing the intervention in retrograde fashion.  My plan is to place a covered stent over the lesion in an effort to trap the thrombus in its current location and prevent embolization. After discussing the risk and benefits of the above, [place] elected to proceed.  She is aware that aortic surgery and surgery on its first-order branches can have significant risks and may require sternotomy should complications arise.   Patient ate this morning.  Rebooked for midmorning. Plan for OR today.  Cassandria Santee, MD [facility] Phone Number: [phone] 04/14/2022 9:01 AM

## 2022-04-14 NOTE — Transfer of Care (Signed)
Immediate Anesthesia Transfer of Care Note  Patient: Connie Herrera  Procedure(s) Performed: CAROTID CUTDOWN WITH BRACHIOCEPHALIC STENTING  Patient Location: PACU  Anesthesia Type:General  Level of Consciousness: drowsy and patient cooperative  Airway & Oxygen Therapy: Patient Spontanous Breathing  Post-op Assessment: Report given to RN and Post -op Vital signs reviewed and stable  Post vital signs: Reviewed and stable  Last Vitals:  Vitals Value Taken Time  BP 145/89 04/14/22 1307  Temp    Pulse    Resp 16 04/14/22 1310  SpO2    Vitals shown include unvalidated device data.  Last Pain:  Vitals:   04/14/22 1025  TempSrc: Oral  PainSc: 0-No pain      Patients Stated Pain Goal: 0 (07/68/08 8110)  Complications: No notable events documented.

## 2022-04-14 NOTE — Anesthesia Procedure Notes (Signed)
Arterial Line Insertion Start/End1/12/2022 10:30 AM, 04/14/2022 10:45 AM Performed by: Lance Coon, CRNA, CRNA  Patient location: Pre-op. Preanesthetic checklist: patient identified, IV checked, site marked, risks and benefits discussed, surgical consent, monitors and equipment checked, pre-op evaluation, timeout performed and anesthesia consent Lidocaine 1% used for infiltration Left, radial was placed Catheter size: 20 G Hand hygiene performed  and maximum sterile barriers used   Attempts: 2 Procedure performed without using ultrasound guided technique. Following insertion, dressing applied and Biopatch. Post procedure assessment: normal and unchanged  Patient tolerated the procedure well with no immediate complications.

## 2022-04-14 NOTE — Progress Notes (Signed)
Pt arrived to 4e from PACU. Pt oriented to room and staff. Right groin and right neck incisions dry and intact. Small, soft hematoma present on right groin. MD aware, per PACU RN report. Vitals obtained. Tele placed and CCMD notified x2 verifiers.

## 2022-04-14 NOTE — Anesthesia Preprocedure Evaluation (Addendum)
Anesthesia Evaluation  Patient identified by MRN, date of birth, ID band Patient awake    Reviewed: Allergy & Precautions, H&P , NPO status , Patient's Chart, lab work & pertinent test results  Airway Mallampati: II  TM Distance: >3 FB Neck ROM: Full    Dental no notable dental hx. (+) Missing, Dental Advisory Given,    Pulmonary former smoker   Pulmonary exam normal breath sounds clear to auscultation       Cardiovascular Exercise Tolerance: Good hypertension, Pt. on medications Normal cardiovascular exam Rhythm:Regular Rate:Normal  ECHO  1. Left ventricular ejection fraction, by estimation, is 55 to 60%. The  left ventricle has normal function. The left ventricle has no regional  wall motion abnormalities. There is mild concentric left ventricular  hypertrophy. Left ventricular diastolic  parameters are consistent with Grade I diastolic dysfunction (impaired  relaxation).   2. Right ventricular systolic function is normal. The right ventricular  size is normal. Tricuspid regurgitation signal is inadequate for assessing  PA pressure.   3. No evidence of mitral valve regurgitation.   4. The aortic valve was not well visualized. Aortic valve regurgitation  is not visualized.   5. The inferior vena cava is normal in size with greater than 50%  respiratory variability, suggesting right atrial pressure of 3 mmHg.     Neuro/Psych CVA, Residual Symptoms negative neurological ROS  negative psych ROS   GI/Hepatic negative GI ROS, Neg liver ROS,,,  Endo/Other  negative endocrine ROS  Morbid obesity  Renal/GU negative Renal ROS  negative genitourinary   Musculoskeletal negative musculoskeletal ROS (+)    Abdominal   Peds negative pediatric ROS (+)  Hematology negative hematology ROS (+) Blood dyscrasia, anemia   Anesthesia Other Findings   Reproductive/Obstetrics negative OB ROS                              Anesthesia Physical Anesthesia Plan  ASA: 3 and emergent  Anesthesia Plan: General   Post-op Pain Management: Ofirmev IV (intra-op)*   Induction: Intravenous  PONV Risk Score and Plan: 3 and Ondansetron, Dexamethasone and Treatment may vary due to age or medical condition  Airway Management Planned: Oral ETT  Additional Equipment: Arterial line  Intra-op Plan:   Post-operative Plan: Extubation in OR  Informed Consent: I have reviewed the patients History and Physical, chart, labs and discussed the procedure including the risks, benefits and alternatives for the proposed anesthesia with the patient or authorized representative who has indicated his/her understanding and acceptance.       Plan Discussed with: Anesthesiologist and CRNA  Anesthesia Plan Comments: ( brachiocephalic thrombus . right common carotid artery cutdown and performing the intervention in retrograde fashion. plan is to place a covered stent over the lesion in an effort to trap the thrombus in its current location and prevent embolization. )       Anesthesia Quick Evaluation

## 2022-04-14 NOTE — Progress Notes (Signed)
Pt has been picked up by OR tech and transport to the OR.

## 2022-04-14 NOTE — Progress Notes (Signed)
The patient was continuously experiencing nausea and vomiting since the beginning of the night shift.  She received IV Zofran around 1656 but it did not help.  The nurse paged the doctor and asked to give the patient Phenergan. Dr. Bridgett Larsson ordered Phenergan 25 mg IVPB every six hours as needed for nausea and vomiting.  After receiving the Phenergan at 2122, the patient has stopped vomiting and states her nausea is better. She is now able to eat some food.  Will continue to monitor.  Lupita Dawn, RN

## 2022-04-14 NOTE — Anesthesia Procedure Notes (Signed)
Procedure Name: Intubation Date/Time: 04/14/2022 11:14 AM  Performed by: Lance Coon, CRNAPre-anesthesia Checklist: Patient identified, Emergency Drugs available, Suction available, Patient being monitored and Timeout performed Patient Re-evaluated:Patient Re-evaluated prior to induction Oxygen Delivery Method: Circle system utilized Preoxygenation: Pre-oxygenation with 100% oxygen Induction Type: IV induction Ventilation: Mask ventilation without difficulty Laryngoscope Size: Miller and 3 Grade View: Grade I Tube type: Oral Tube size: 7.0 mm Number of attempts: 1 Airway Equipment and Method: Stylet Placement Confirmation: ETT inserted through vocal cords under direct vision, positive ETCO2 and breath sounds checked- equal and bilateral Secured at: 21 cm Tube secured with: Tape Dental Injury: Teeth and Oropharynx as per pre-operative assessment

## 2022-04-14 NOTE — Op Note (Signed)
NAME: Connie Herrera    MRN: [id number] DOB: Feb 19, 1976    DATE OF OPERATION: 04/14/2022  PREOP DIAGNOSIS:    Brachiocephalic artery thrombus  POSTOP DIAGNOSIS:    Same  PROCEDURE:    Right common carotid artery cutdown Retrograde access of the right common carotid using a micropuncture needle under direct visualization Right common femoral vein access using ultrasound-guided micropuncture needle Brachiocephalic artery stenting using 11 x 39 mm Gore VBX Aortic arch angiogram  SURGEON: Broadus John  ASSIST: Luisa Dago, PA  ANESTHESIA: General  EBL: 150 mL  INDICATIONS:    Connie Herrera is a 47 y.o. female who presented to the hospital with a myriad of symptoms most consistent with demyelinating process.  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  She was initially heparinized in an effort to treat this medically, however the lesion was not responsive over the course of 48 hours.  After having an extensive risk-benefit discussion with her regarding innominate artery stenting, Connie Herrera elected to proceed.  FINDINGS:   Brachiocephalic artery thrombus  TECHNIQUE:   Patient was brought to the OR laid in supine position.  General esthesia was induced and patient was prepped draped in standard fashion.  The case began with ultrasound insonation of the right common carotid artery.  This was marked.  Next, the right common carotid artery was exposed in standard fashion.  Once exposed, the patient was heparinized to an ACT greater than 250 for the remainder of the case.  A U-stitch was placed in the common carotid artery, and the common carotid was clamped distally to ensure there was no embolization.  An ultrasound-guided micropuncture needle was used to access the right common femoral vein.  The TCAR device from [place] was used for flow reversal, the main reason I  decided to use this construct, was because it had a short, 8 [place] sheath that would allow for 11 mm VBX stenting.  The right common carotid was accessed in retrograde fashion using a micropuncture needle.  Wire was run into the descending aorta, and the access was dilated to hold the TCAR device.  Next, the TCAR device was hooked up in standard fashion and flow reversal checked.  This was excellent.  At this point, there was a wire in the descending aorta.  Marker pigtail was placed across the lesion and retrograde carotid angiogram followed demonstrating the lesion and its positioning in the brachiocephalic artery.  Using preoperative CT the diameter.  An 11 x 39 mm Gore VBX stent was brought to the field.  This was positioned across the lesion, followed by repeat angiography.  It was then inflated.  Once inflated to profile, the descending aortic wire was replaced with a flush pigtail catheter for standard arch angiogram.  This demonstrated excellent apposition and expansion of the stent with normal flow through the great arch vessels as well as right vertebral artery.  I was very happy with this result.  The common carotid artery was clamped both proximally and distally and allowed to blow out of the arteriotomy both antegrade and retrograde.  The U-stitch was tied and Doppler brought onto the field to ensure this signal was normal both proximal and distal to the arteriotomy.  It was.  Patient also had a palpable right radial pulse.  The neck and incision was irrigated copiously with saline, and closed with a layer of 3-0  Vicryl with Monocryl and Dermabond at the level of the skin.    Macie Burows, MD [facility] DATE OF DICTATION:   04/14/2022

## 2022-04-15 ENCOUNTER — Telehealth: Payer: Self-pay | Admitting: Licensed Clinical Social Worker

## 2022-04-15 DIAGNOSIS — I708 Atherosclerosis of other arteries: Secondary | ICD-10-CM | POA: Diagnosis not present

## 2022-04-15 DIAGNOSIS — E041 Nontoxic single thyroid nodule: Secondary | ICD-10-CM | POA: Diagnosis not present

## 2022-04-15 DIAGNOSIS — I639 Cerebral infarction, unspecified: Secondary | ICD-10-CM | POA: Diagnosis not present

## 2022-04-15 DIAGNOSIS — I749 Embolism and thrombosis of unspecified artery: Secondary | ICD-10-CM | POA: Diagnosis not present

## 2022-04-15 LAB — BASIC METABOLIC PANEL
Anion gap: 10 (ref 5–15)
BUN: 8 mg/dL (ref 6–20)
CO2: 23 mmol/L (ref 22–32)
Calcium: 8.5 mg/dL — ABNORMAL LOW (ref 8.9–10.3)
Chloride: 99 mmol/L (ref 98–111)
Creatinine, Ser: 0.76 mg/dL (ref 0.44–1.00)
GFR, Estimated: >60 mL/min (ref >60)
Glucose, Bld: 125 mg/dL — ABNORMAL HIGH (ref 70–99)
Potassium: 3.7 mmol/L (ref 3.5–5.1)
Sodium: 132 mmol/L — ABNORMAL LOW (ref 135–145)

## 2022-04-15 LAB — CBC
HCT: 28.4 % — ABNORMAL LOW (ref 36.0–46.0)
Hemoglobin: 8.9 g/dL — ABNORMAL LOW (ref 12.0–15.0)
MCH: 22.6 pg — ABNORMAL LOW (ref 26.0–34.0)
MCHC: 31.3 g/dL (ref 30.0–36.0)
MCV: 72.3 fL — ABNORMAL LOW (ref 80.0–100.0)
Platelets: 363 10*3/uL (ref 150–400)
RBC: 3.93 MIL/uL (ref 3.87–5.11)
RDW: 16.4 % — ABNORMAL HIGH (ref 11.5–15.5)
WBC: 9.7 10*3/uL (ref 4.0–10.5)
nRBC: 0 % (ref 0.0–0.2)

## 2022-04-15 LAB — OLIGOCLONAL BANDS, CSF + SERM

## 2022-04-15 LAB — MAGNESIUM: Magnesium: 2 mg/dL (ref 1.7–2.4)

## 2022-04-15 LAB — ANA W/REFLEX IF POSITIVE: Anti Nuclear Antibody (ANA): NEGATIVE

## 2022-04-15 LAB — HEPARIN LEVEL (UNFRACTIONATED): Heparin Unfractionated: 0.46 IU/mL (ref 0.30–0.70)

## 2022-04-15 MED ORDER — ROSUVASTATIN CALCIUM 5 MG PO TABS
10.0000 mg | ORAL_TABLET | Freq: Every day | ORAL | Status: DC
  Administered 2022-04-15 – 2022-04-17 (×3): 10 mg via ORAL
  Filled 2022-04-15 (×3): qty 2

## 2022-04-15 MED ORDER — POLYETHYLENE GLYCOL 3350 17 G PO PACK
17.0000 g | PACK | Freq: Two times a day (BID) | ORAL | Status: DC
  Administered 2022-04-15 – 2022-04-16 (×3): 17 g via ORAL
  Filled 2022-04-15 (×5): qty 1

## 2022-04-15 NOTE — Progress Notes (Addendum)
[facility] [place]  Subjective  - States her right hand feels better.   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 POD # 1 Brachiocephalic artery stenting   Good inflow with easily palpable radial pulse.  Grip 5/5 and patient states her right hand feel "normal." Neck incision healing well She is currently on ASA and Heparin.    Roxy Horseman 04/15/2022 7:59 AM --  VASCULAR STAFF ADDENDUM: I have independently interviewed and examined the patient. I agree with the above.  Healing well. Sensory and motor intact.  Will see in one month with CT angiogram of the chest   Pt will need discharge on ASA/DOAC v DAPT, pending thrombotic workup.   Cassandria Santee, MD [facility] Phone Number: [phone] 04/15/2022 4:15 PM    Laboratory Lab Results: Recent Labs    04/14/22 0545 04/15/22 0215  WBC 7.2 9.7  HGB 11.2* 8.9*  HCT 37.3 28.4*  PLT 493* 363   BMET Recent Labs    04/14/22 0545 04/15/22 0215  NA 133* 132*  K 4.1 3.7  CL 103 99  CO2 21* 23  GLUCOSE 122* 125*  BUN 10 8  CREATININE 0.84 0.76  CALCIUM 8.9 8.5*    COAG Lab Results  Component Value Date   INR 1.0 04/09/2022   No results found for: "PTT"

## 2022-04-15 NOTE — Progress Notes (Signed)
STROKE TEAM PROGRESS NOTE   INTERVAL HISTORY No family at the bedside. Pt sitting in bed, having breakfast. Neuro intact, had brachiocephalic A stenting with Dr. Unk Lightning yesterday. Doing well, still on ASA and heparin IV.   Vitals:   04/14/22 2334 04/15/22 0355 04/15/22 0831 04/15/22 1209  BP: 138/80 131/79 136/80 (!) 144/90  Pulse: 98 90 85   Resp: '19 19 17   '$ Temp: 98.5 F (36.9 C) 98.6 F (37 C) 99 F (37.2 C) 98.7 F (37.1 C)  TempSrc: Oral Oral Oral Oral  SpO2: 100% 100% 98%   Weight:      Height:       CBC:  Recent Labs  Lab 04/09/22 1315 04/09/22 1322 04/12/22 1202 04/13/22 0258 04/14/22 0545 04/15/22 0215  WBC 7.2   < > 7.5   < > 7.2 9.7  NEUTROABS 5.3  --  5.1  --   --   --   HGB 10.1*   < > 10.6*   < > 11.2* 8.9*  HCT 34.5*   < > 34.1*   < > 37.3 28.4*  MCV 76.2*   < > 72.6*   < > 73.7* 72.3*  PLT 424*   < > 410*   < > 493* 363   < > = values in this interval not displayed.   Basic Metabolic Panel:  Recent Labs  Lab 04/13/22 0258 04/14/22 0545 04/15/22 0215  NA 134* 133* 132*  K 3.4* 4.1 3.7  CL 102 103 99  CO2 21* 21* 23  GLUCOSE 97 122* 125*  BUN '9 10 8  '$ CREATININE 0.76 0.84 0.76  CALCIUM 8.8* 8.9 8.5*  MG 1.8  --  2.0   Lipid Panel:  Recent Labs  Lab 04/10/22 0443  CHOL 138  TRIG 171*  HDL 36*  CHOLHDL 3.8  VLDL 34  LDLCALC 68   HgbA1c:  Recent Labs  Lab 04/10/22 0443  HGBA1C 6.0*   Urine Drug Screen:  Recent Labs  Lab 04/09/22 2021  LABOPIA NONE DETECTED  COCAINSCRNUR NONE DETECTED  LABBENZ POSITIVE*  AMPHETMU NONE DETECTED  THCU POSITIVE*  LABBARB NONE DETECTED    Alcohol Level  Recent Labs  Lab 04/09/22 1315  ETH <10    IMAGING past 48hrs  DG C-Arm 1-60 Min  Result Date: 04/14/2022 CLINICAL DATA:  Carotid cutdown with brachiocephalic stent EXAM: DG C-ARM 1-60 MIN COMPARISON:  CTA chest dated 04/12/2022 FINDINGS: Four fluoroscopic images and 6 fluoroscopic cines obtained during carotid cutdown with  brachiocephalic stent. 3 minutes 33 seconds fluoro time utilized. Radiation dose 64.92 mGy Kerma. Please see performing physicians operative report for full details IMPRESSION: Fluoroscopic images were obtained for intraoperative guidance of carotid cutdown with brachiocephalic stent. Electronically Signed   By: Darrin Nipper M.D.   On: 04/14/2022 13:13   DG C-Arm 1-60 Min-No Report  Result Date: 04/14/2022 Fluoroscopy was utilized by the requesting physician.  No radiographic interpretation.   DG C-Arm 1-60 Min-No Report  Result Date: 04/14/2022 Fluoroscopy was utilized by the requesting physician.  No radiographic interpretation.     PHYSICAL EXAM  Temp:  [98 F (36.7 C)-99 F (37.2 C)] 98.7 F (37.1 C) (01/10 1209) Pulse Rate:  [78-98] 85 (01/10 0831) Resp:  [14-19] 17 (01/10 0831) BP: (115-154)/(73-90) 144/90 (01/10 1209) SpO2:  [98 %-100 %] 98 % (01/10 0831) Arterial Line BP: (151-179)/(80-90) 160/83 (01/09 2334)  General - obese, well developed, in no apparent distress.  Ophthalmologic - fundi not visualized due to noncooperation.  Cardiovascular - Regular rhythm and rate.  Mental Status -  Level of arousal and orientation to time, place, and person were intact. Language including expression, naming, repetition, comprehension was assessed and found intact. Attention span and concentration were normal. Fund of Knowledge was assessed and was intact.  Cranial Nerves II - XII - II - Visual field intact OU. III, IV, VI - Extraocular movements intact. V - Facial sensation intact bilaterally. VII - Facial movement intact bilaterally. VIII - Hearing & vestibular intact bilaterally. X - Palate elevates symmetrically. XI - Chin turning & shoulder shrug intact bilaterally. XII - Tongue protrusion intact.  Motor Strength - The patient's strength was normal in all extremities and pronator drift was absent.  Bulk was normal and fasciculations were absent.   Motor Tone - Muscle tone  was assessed at the neck and appendages and was normal.  Reflexes - The patient's reflexes were symmetrical in all extremities and she had no pathological reflexes.  Sensory - Light touch, temperature/pinprick were assessed and were symmetrical.    Coordination - The patient had normal movements in the hands and feet with no ataxia or dysmetria.  Tremor was absent.  Gait and Station - deferred.    ASSESSMENT/PLAN Ms. AIYAH SCARPELLI is a 47 y.o. female with history of hypertension and smoking.  Multiple areas of diffusion restriction in this patient who has had a longstanding history of transient neurological symptoms concerning for demyelinating disease such as multiple sclerosis. However she does also have some stroke risk factors such as hypertension, obesity.    She has frequent transient neurological symptoms of numbness and weakness that are transient and self resolved.  On 04/09/22 she developed sudden onset left upper extremity weakness.  Today she notes the L arm and leg feel back to full strength and sensation.  During stroke workup, noted R brachiocephalic artery clot, now on Heparin IV.  Hypercoag labs still pending.  Lower dopplers pending.   She has had extensive prior neurological evaluation for abnormal MRI scan showing nonspecific T2/FLAIR white matter hyperintensities raising concern for multiple sclerosis but essentially negative workup for the same including lumbar puncture most recently 05/05/2021 which was normal   Stroke - Multiple bilateral parietal occipital lobe subacute small  infarcts, sembolic pattern, cryptogenic CT head -No acute intracranial abnormality.  CTA head & neck Filling defect in the brachiocephalic artery, concerning for intraluminal thrombus. MRI  brain- Multiple small areas of diffusion hyperintense signal in the occipital parietal lobes bilaterally. Chronic hemorrhage in the left lateral basal ganglia with mild peripheral enhancement unchanged from  the prior MRI. CTA repeat nonocclusive brachiocephalic artery thrombus Echo bubble study EF 55% LE venous doppler no DVT TCD bubble study negative for PFO Will consider TEE to complete the work up. If not able to do this week, may consider to complete as outpt Hypercoagulable and autoimmune work up neg   LDL 68 A1C 6.0 UDS positive for THC No antithrombotics PTA, now on heparin gtt and ASA. OK to transition to DAPT from neuro standpoint.  Therapy recommendations: none Disposition - pending  Thrombus in brachiocephalic artery noted on CTA.  Started on IV heparin. Lower extremity Doppler studies no DVT  Hypercoag Panel and ANA neg  Repeat CTA nonocclusive brachiocephalic artery thrombus S/p stenting 04/14/22 with Dr. Unk Lightning [facility] Recommend TEE for further evaluation. If not able to do this week, will consider outpt TEE  ? Concern for MS Due to lesion locations in brain imaging MRI cervical spine-Mild degenerative change  in the cervical spine.  MRI thoracic-Mild thoracic degenerative change.  LP: pending OCB's, resulted CSF unremarkable MRI brain WM changes likely ischemic changes Follows with Dr. Leta Baptist at [facility]  Hypertension Home meds:  amlodipine '5mg'$  stable Long-term BP goal normotensive  Hyperlipidemia Home meds:  none LDL 68, goal < 70 Add crestor 10 No high intensity statin given LDL at goal Continue statin on discharge.  Other Stroke Risk Factors Former smoker, quit 4 years ago Substance abuse - UDS:  THC POSITIVE. Patient advised to stop using due to stroke risk. Obesity, Body mass index is 37.93 kg/m., BMI >/= 30 associated with increased stroke risk, recommend weight loss, diet and exercise as appropriate  ? Obstructive sleep apnea: sleep study completed last night inpatient Migraine history  Other Active Problems Thyroid nodules bil: incidental finding on Cervical MRI, [place] completed 04/10/22.  Biopsy will need to be done in the outpatient setting in the next few  weeks.    Hospital day # 5   Rosalin Hawking, MD PhD Stroke Neurology 04/15/2022 3:46 PM    To contact Stroke Continuity provider, please refer to [email or web address] After hours, contact General Neurology

## 2022-04-15 NOTE — Telephone Encounter (Signed)
TC from pt asking about assistance with housing costs as she has been in the hospital at [facility] for a week and expects to still be inpatient until next week. Pt sees Dr. Chryl Heck for hematology and does not have a cancer diagnosis. Reviewed that most grants and funds are cancer specific. Did discuss potential local resource of [employer] and provided contact information. Pt has also been trying to reach DSS but has been unable to get someone on the phone. CSW also encouraged pt to speak with inpatient social worker regarding any other resources that may be available.   Female Herrera E Mirko Tailor, LCSW

## 2022-04-15 NOTE — Progress Notes (Signed)
ANTICOAGULATION CONSULT NOTE - [place] for heparin Indication: intraluminal thrombus in the R brachiocephalic artery.  No Known Allergies  Patient Measurements: Height: '5\' 6"'$  (167.6 cm) Weight: 106.6 kg (235 lb 0.2 oz) IBW/kg (Calculated) : 59.3 Heparin Dosing Weight: 84 kg  Vital Signs: Temp: 98.6 F (37 C) (01/10 0355) Temp Source: Oral (01/10 0355) BP: 131/79 (01/10 0355) Pulse Rate: 90 (01/10 0355)  Labs: Recent Labs    04/13/22 0258 04/14/22 0545 04/15/22 0215  HGB 10.1* 11.2* 8.9*  HCT 34.1* 37.3 28.4*  PLT 410* 493* 363  HEPARINUNFRC 0.53 0.50 0.46  CREATININE 0.76 0.84 0.76     Estimated Creatinine Clearance: 108.5 mL/min (by C-G formula based on SCr of 0.76 mg/dL).   Medical History: Past Medical History:  Diagnosis Date   Brain lesion    COVID-19 04/2020   Diplopia    Hypertension    Iron deficiency anemia      Assessment:  59 yoF presenting with recurrent transient numbness and weakness. 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 Neurology has consulted pharmacy to dose heparin with conservative goal and no bolus. Patient was not on anticoagulation prior to admission.    1/7 CT angio chest: re-demonstrated nonocclusive left brachiocephalic artery thrombus.  Now s/p stenting procedure 04/14/22. Plans noted for a DOAC  Goal of Therapy:  Heparin level 0.3-0.5 units/ml (NO Bolus) Monitor platelets by anticoagulation protocol: Yes   Plan:  Continue heparin at 1650 units/hr Daily heparin level and CBC  Hildred Laser, PharmD Clinical Pharmacist **Pharmacist phone directory can now be found on [email or web address] ([email or web address]).  Listed under [facility].

## 2022-04-15 NOTE — Anesthesia Postprocedure Evaluation (Signed)
Anesthesia Post Note  Patient: SHERESA CULLOP  Procedure(s) Performed: CAROTID CUTDOWN WITH BRACHIOCEPHALIC STENTING, WITH ULTRASOUND OF THE RIGHT FEMORAL VEIN (Right: Neck) DIAGNOSTIC AORTOGRAM (Groin)     Patient location during evaluation: PACU Anesthesia Type: General Level of consciousness: awake and alert Pain management: pain level controlled Vital Signs Assessment: post-procedure vital signs reviewed and stable Respiratory status: spontaneous breathing, nonlabored ventilation, respiratory function stable and patient connected to nasal cannula oxygen Cardiovascular status: blood pressure returned to baseline and stable Postop Assessment: no apparent nausea or vomiting Anesthetic complications: no  No notable events documented.  Last Vitals:  Vitals:   04/14/22 2334 04/15/22 0355  BP: 138/80 131/79  Pulse: 98 90  Resp: 19 19  Temp: 36.9 C 37 C  SpO2: 100% 100%    Last Pain:  Vitals:   04/15/22 0639  TempSrc:   PainSc: Asleep                 Arshia Rondon

## 2022-04-15 NOTE — Progress Notes (Addendum)
[facility] PROGRESS NOTE   Connie Herrera CHE:[id number] DOB: [birth year] DOA: 04/09/2022  PCP: Camillia Herter, NP  Brief History/Interval Summary: 47 y.o. female with a past medical history of essential hypertension, previous history of transient numbness and weakness which have resolved spontaneously.  She has been seen by neurology previously.  Underwent lumbar puncture in January 2023.  She was in her usual state of health till this morning when she went to see her primary care provider.  This was for a routine visit.  She came back to her house and then noticed that her left arm was weak.  She had similar symptoms about a week ago but thought that weakness resolved spontaneously. In the emergency department she underwent MRI brain which showed multiple small areas of diffusion hyperintense signal in the occipital parietal lobes bilaterally.  Chronic changes were also noted.  Etiology for these findings were not clear.  Neurology was consulted.    Consultants: Neurology.  Vascular surgery  Procedures:  Lumbar puncture  On 08/07/59: Brachiocephalic artery stenting using 11 x 39 mm Gore VBX  TEE is pending    Subjective/Interval History: Patient has been quite nauseated all through the night.  Feels better this morning.  Denies any abdominal pain.  Denies any chest pain or shortness of breath.     Assessment/Plan:  Acute stroke Presented with left arm weakness.  Per neurology embolic etiology suspected.   There was also concern for demyelinating process.  Patient underwent lumbar puncture.  Underwent MRI brain cervical spine and thoracic spine. Neurology is following. LDL noted to be 68.  HbA1c is 6.0. TSH 2.4.  RPR nonreactive.  HIV nonreactive Echocardiogram shows normal systolic function with grade 1 diastolic dysfunction. No needs identified by physical and Occupational Therapy. TEE is recommended by neurology.  Cardiology was contacted.    Thrombus in the  right brachiocephalic artery This was noted on CT angiogram.  Started on IV heparin. CT angiogram was repeated.  Continues to show the thrombus. Lower extremity Doppler study negative for DVT. Hypercoagulable panel was sent by neurology.  Anticardiolipin antibody is still not elevated.  Beta-2 glycoprotein is not elevated.  Protein C is 129 which is in the normal range.  Protein S is also in the normal range.  Antithrombin activity is in the normal range.  No lupus anticoagulant was detected. Underwent brachiocephalic artery stenting on 1/9. Remains on heparin.  Hemoglobin noted to be slightly lower today.  Will recheck tomorrow.  No overt bleeding identified. Defer anti-coagulation vs DAPT to Neurology and vascular surgery.  Essential hypertension Holding her medications for now.  Blood pressure is reasonably well-controlled.  Hypokalemia Supplemented.  Hyponatremia Stable.  Continue to monitor.  Microcytic anemia/iron deficiency This is as a result of menorrhagia.   Anemia panel shows ferritin of 5, iron of 26, TIBC 533, percent saturation 5.  Vitamin B12 level is 443, folic acid 15.4. Due to her acute issues we are holding off on iron infusion for now.  Consider administering prior to discharge. Drop in hemoglobin noted from 11.2 to 8.9.  No overt bleeding noted.  Recheck labs tomorrow.  Patient is on heparin as mentioned above.  Left thyroid nodule Imaging studies suggested nodules in both lobes.  Ultrasound was done which revealed a nodule of concern in the left thyroid lobe.  They recommend a biopsy.  TSH is normal. This was communicated to the patient.  We will wait for her acute issues to subside.  Biopsy will need  to be done in the outpatient setting in the next few weeks.    Menorrhagia/uterine fibroids Has not followed up with gynecology in more than a year.  Patient encouraged to follow-up with her gynecologist.  If she does get placed on oral anticoagulants at discharge then  it would increase her risk of significant menorrhagia.  Situational anxiety and insomnia Continue with the low-dose alprazolam as needed and melatonin for insomnia.  Constipation Continue with laxatives. No bowel movements have been charted.  Will increase the dose.  Obesity Estimated body mass index is 37.93 kg/m as calculated from the following:   Height as of this encounter: '5\' 6"'$  (1.676 m).   Weight as of this encounter: 106.6 kg.   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     Medications: Scheduled:  aspirin EC  81 mg Oral Q0600   docusate sodium  100 mg Oral Daily   melatonin  3 mg Oral QHS   pantoprazole  40 mg Oral Daily   polyethylene glycol  17 g Oral Daily   senna-docusate  2 tablet Oral BID   Continuous:  sodium chloride     sodium chloride     heparin 1,650 Units/hr (04/15/22 0511)   magnesium sulfate bolus IVPB     promethazine (PHENERGAN) injection (IM or IVPB) Stopped (04/14/22 2137)   GEZ:MOQHUT chloride, acetaminophen **OR** acetaminophen (TYLENOL) oral liquid 160 mg/5 mL **OR** acetaminophen, ALPRAZolam, alum & mag hydroxide-simeth, gadobutrol, guaiFENesin-dextromethorphan, hydrALAZINE, labetalol, magnesium sulfate bolus IVPB, metoprolol tartrate, morphine injection, ondansetron, oxyCODONE, phenol, potassium chloride, promethazine (PHENERGAN) injection (IM or IVPB)  Antibiotics: Anti-infectives (From admission, onward)    Start     Dose/Rate Route Frequency Ordered Stop   04/14/22 2000  ceFAZolin (ANCEF) IVPB 2g/100 mL premix        2 g 200 mL/hr over 30 Minutes Intravenous Every 8 hours 04/14/22 1626 04/15/22 0441       Objective:  Vital Signs  Vitals:   04/14/22 2012 04/14/22 2334 04/15/22 0355 04/15/22 0831  BP: (!)  140/84 138/80 131/79   Pulse: 92 98 90 85  Resp: '16 19 19   '$ Temp: 98 F (36.7 C) 98.5 F (36.9 C) 98.6 F (37 C) 99 F (37.2 C)  TempSrc: Oral Oral Oral Oral  SpO2: 100% 100% 100% 98%  Weight:      Height:        Intake/Output Summary (Last 24 hours) at 04/15/2022 0914 Last data filed at 04/15/2022 0445 Gross per 24 hour  Intake 2489.11 ml  Output 1925 ml  Net 564.11 ml    Filed Weights   04/09/22 2023  Weight: 106.6 kg    General appearance: Awake alert.  In no distress Resp: Clear to auscultation bilaterally.  Normal effort Cardio: S1-S2 is normal regular.  No S3-S4.  No rubs murmurs or bruit GI: Abdomen is soft.  Nontender nondistended.  Bowel sounds are present normal.  No masses organomegaly Extremities: No edema.  Full range of motion of lower extremities. Neurologic: Alert and oriented x3.  No focal neurological deficits.      Lab Results:  Data Reviewed: I have personally reviewed following labs and reports of the imaging studies  CBC: Recent Labs  Lab 04/09/22 1315 04/09/22 1322 04/12/22 0029 04/12/22 1202 04/13/22 0258 04/14/22 0545 04/15/22 0215  WBC 7.2   < >  8.6 7.5 7.6 7.2 9.7  NEUTROABS 5.3  --   --  5.1  --   --   --   HGB 10.1*   < > 9.9* 10.6* 10.1* 11.2* 8.9*  HCT 34.5*   < > 32.2* 34.1* 34.1* 37.3 28.4*  MCV 76.2*   < > 72.0* 72.6* 74.1* 73.7* 72.3*  PLT 424*   < > 400 410* 410* 493* 363   < > = values in this interval not displayed.     Basic Metabolic Panel: Recent Labs  Lab 04/10/22 0443 04/11/22 0828 04/13/22 0258 04/14/22 0545 04/15/22 0215  NA 133* 135 134* 133* 132*  K 3.3* 3.4* 3.4* 4.1 3.7  CL 101 103 102 103 99  CO2 24 19* 21* 21* 23  GLUCOSE 101* 109* 97 122* 125*  BUN 5* '7 9 10 8  '$ CREATININE 0.68 0.78 0.76 0.84 0.76  CALCIUM 8.7* 8.5* 8.8* 8.9 8.5*  MG 1.8 1.8 1.8  --  2.0     GFR: Estimated Creatinine Clearance: 108.5 mL/min (by C-G formula based on SCr of 0.76 mg/dL).  Liver Function Tests: Recent  Labs  Lab 04/09/22 1315 04/10/22 0443  AST 20 22  ALT 24 24  ALKPHOS 76 77  BILITOT <0.1* 0.4  PROT 6.4* 6.8  ALBUMIN 3.4* 3.3*      Coagulation Profile: Recent Labs  Lab 04/09/22 1315  INR 1.0      CBG: Recent Labs  Lab 04/09/22 1038  GLUCAP 101*      Recent Results (from the past 240 hour(s))  CSF culture w Gram Stain     Status: None   Collection Time: 04/09/22  5:08 PM   Specimen: PATH Cytology CSF; Cerebrospinal Fluid  Result Value Ref Range Status   Specimen Description CSF  Final   Special Requests NONE  Final   Gram Stain NO WBC SEEN NO ORGANISMS SEEN CYTOSPIN SMEAR   Final   Culture   Final    NO GROWTH Performed at [facility], [place] [address], [place], [place] [address]    Report Status 04/12/2022 FINAL  Final  Culture, fungus without smear     Status: None (Preliminary result)   Collection Time: 04/09/22  5:08 PM   Specimen: PATH Cytology CSF; Cerebrospinal Fluid  Result Value Ref Range Status   Specimen Description CSF  Final   Special Requests NONE  Final   Culture   Final    NO FUNGUS ISOLATED AFTER 5 DAYS Performed at [facility] [address], [place], [place] [address]    Report Status PENDING  Incomplete  Surgical pcr screen     Status: None   Collection Time: 04/14/22  6:20 AM   Specimen: Nasal Mucosa; Nasal Swab  Result Value Ref Range Status   MRSA, PCR NEGATIVE NEGATIVE Final   Staphylococcus aureus NEGATIVE NEGATIVE Final    Comment: (NOTE) The Xpert SA Assay (FDA approved for NASAL specimens in patients 47 years of age and older), is one component of a comprehensive surveillance program. It is not intended to diagnose infection nor to guide or monitor treatment. Performed at [facility], [place] [address] [place], [place] [address]       Radiology Studies: DG C-Arm 1-60 Min  Result Date: 04/14/2022 CLINICAL DATA:  Carotid cutdown with brachiocephalic stent EXAM: DG C-ARM 1-60 MIN COMPARISON:   CTA chest dated 04/12/2022 FINDINGS: Four fluoroscopic images and 6 fluoroscopic cines obtained during carotid cutdown with brachiocephalic  stent. 3 minutes 33 seconds fluoro time utilized. Radiation dose 64.92 mGy Kerma. Please see performing physicians operative report for full details IMPRESSION: Fluoroscopic images were obtained for intraoperative guidance of carotid cutdown with brachiocephalic stent. Electronically Signed   By: Darrin Nipper M.D.   On: 04/14/2022 13:13   DG C-Arm 1-60 Min-No Report  Result Date: 04/14/2022 Fluoroscopy was utilized by the requesting physician.  No radiographic interpretation.   DG C-Arm 1-60 Min-No Report  Result Date: 04/14/2022 Fluoroscopy was utilized by the requesting physician.  No radiographic interpretation.       LOS: 5 days   Oluwatamilore Starnes [employer] on [email or web address]  04/15/2022, 9:14 AM

## 2022-04-16 ENCOUNTER — Inpatient Hospital Stay: Payer: BLUE CROSS/BLUE SHIELD | Attending: Hematology and Oncology

## 2022-04-16 ENCOUNTER — Encounter (HOSPITAL_COMMUNITY): Payer: Self-pay | Admitting: Vascular Surgery

## 2022-04-16 DIAGNOSIS — I639 Cerebral infarction, unspecified: Secondary | ICD-10-CM | POA: Diagnosis not present

## 2022-04-16 DIAGNOSIS — I749 Embolism and thrombosis of unspecified artery: Secondary | ICD-10-CM | POA: Diagnosis not present

## 2022-04-16 DIAGNOSIS — E041 Nontoxic single thyroid nodule: Secondary | ICD-10-CM | POA: Diagnosis not present

## 2022-04-16 LAB — CBC
HCT: 29.6 % — ABNORMAL LOW (ref 36.0–46.0)
Hemoglobin: 8.8 g/dL — ABNORMAL LOW (ref 12.0–15.0)
MCH: 22.2 pg — ABNORMAL LOW (ref 26.0–34.0)
MCHC: 29.7 g/dL — ABNORMAL LOW (ref 30.0–36.0)
MCV: 74.7 fL — ABNORMAL LOW (ref 80.0–100.0)
Platelets: 361 10*3/uL (ref 150–400)
RBC: 3.96 MIL/uL (ref 3.87–5.11)
RDW: 16.7 % — ABNORMAL HIGH (ref 11.5–15.5)
WBC: 7.8 10*3/uL (ref 4.0–10.5)
nRBC: 0 % (ref 0.0–0.2)

## 2022-04-16 LAB — BASIC METABOLIC PANEL
Anion gap: 10 (ref 5–15)
BUN: 9 mg/dL (ref 6–20)
CO2: 24 mmol/L (ref 22–32)
Calcium: 8.5 mg/dL — ABNORMAL LOW (ref 8.9–10.3)
Chloride: 101 mmol/L (ref 98–111)
Creatinine, Ser: 0.92 mg/dL (ref 0.44–1.00)
GFR, Estimated: >60 mL/min (ref >60)
Glucose, Bld: 105 mg/dL — ABNORMAL HIGH (ref 70–99)
Potassium: 3.8 mmol/L (ref 3.5–5.1)
Sodium: 135 mmol/L (ref 135–145)

## 2022-04-16 LAB — FACTOR 5 LEIDEN

## 2022-04-16 LAB — HEPARIN LEVEL (UNFRACTIONATED): Heparin Unfractionated: 0.43 IU/mL (ref 0.30–0.70)

## 2022-04-16 LAB — MAGNESIUM: Magnesium: 2 mg/dL (ref 1.7–2.4)

## 2022-04-16 MED ORDER — APIXABAN 5 MG PO TABS
10.0000 mg | ORAL_TABLET | Freq: Two times a day (BID) | ORAL | Status: DC
  Administered 2022-04-16 – 2022-04-17 (×3): 10 mg via ORAL
  Filled 2022-04-16 (×3): qty 2

## 2022-04-16 MED ORDER — APIXABAN 5 MG PO TABS: 5.0000 mg | ORAL_TABLET | Freq: Two times a day (BID) | ORAL | Status: DC

## 2022-04-16 MED ORDER — SODIUM CHLORIDE 0.9 % IV SOLN
250.0000 mg | Freq: Every day | INTRAVENOUS | Status: AC
  Administered 2022-04-16 – 2022-04-17 (×2): 250 mg via INTRAVENOUS
  Filled 2022-04-16 (×2): qty 20

## 2022-04-16 MED ORDER — BISACODYL 5 MG PO TBEC: 10.0000 mg | DELAYED_RELEASE_TABLET | Freq: Every day | ORAL | Status: DC | PRN

## 2022-04-16 MED ORDER — FLEET ENEMA 7-19 GM/118ML RE ENEM
1.0000 | ENEMA | Freq: Once | RECTAL | Status: DC
  Filled 2022-04-16: qty 1

## 2022-04-16 NOTE — Progress Notes (Signed)
STROKE TEAM PROGRESS NOTE   INTERVAL HISTORY No family at the bedside. Pt lying in bed, no acute event overnight. Complaining no bowel movement for 3 days, on miralax bid now. Still on heparin IV but will transition to eliquis.    Vitals:   04/15/22 2010 04/15/22 2337 04/16/22 0550 04/16/22 0836  BP: (!) 143/90 130/77 (!) 140/91 (!) 139/98  Pulse:    (!) 101  Resp: '20 18 20 20  '$ Temp: 98.4 F (36.9 C) 98.2 F (36.8 C) 97.9 F (36.6 C) 99 F (37.2 C)  TempSrc: Oral Oral Oral Oral  SpO2: 100% 100% 98% 97%  Weight:      Height:       CBC:  Recent Labs  Lab 04/09/22 1315 04/09/22 1322 04/12/22 1202 04/13/22 0258 04/15/22 0215 04/16/22 0056  WBC 7.2   < > 7.5   < > 9.7 7.8  NEUTROABS 5.3  --  5.1  --   --   --   HGB 10.1*   < > 10.6*   < > 8.9* 8.8*  HCT 34.5*   < > 34.1*   < > 28.4* 29.6*  MCV 76.2*   < > 72.6*   < > 72.3* 74.7*  PLT 424*   < > 410*   < > 363 361   < > = values in this interval not displayed.   Basic Metabolic Panel:  Recent Labs  Lab 04/15/22 0215 04/16/22 0056  NA 132* 135  K 3.7 3.8  CL 99 101  CO2 23 24  GLUCOSE 125* 105*  BUN 8 9  CREATININE 0.76 0.92  CALCIUM 8.5* 8.5*  MG 2.0 2.0   Lipid Panel:  Recent Labs  Lab 04/10/22 0443  CHOL 138  TRIG 171*  HDL 36*  CHOLHDL 3.8  VLDL 34  LDLCALC 68   HgbA1c:  Recent Labs  Lab 04/10/22 0443  HGBA1C 6.0*   Urine Drug Screen:  Recent Labs  Lab 04/09/22 2021  LABOPIA NONE DETECTED  COCAINSCRNUR NONE DETECTED  LABBENZ POSITIVE*  AMPHETMU NONE DETECTED  THCU POSITIVE*  LABBARB NONE DETECTED    Alcohol Level  Recent Labs  Lab 04/09/22 1315  ETH <10    IMAGING past 48hrs  DG C-Arm 1-60 Min  Result Date: 04/14/2022 CLINICAL DATA:  Carotid cutdown with brachiocephalic stent EXAM: DG C-ARM 1-60 MIN COMPARISON:  CTA chest dated 04/12/2022 FINDINGS: Four fluoroscopic images and 6 fluoroscopic cines obtained during carotid cutdown with brachiocephalic stent. 3 minutes 33 seconds  fluoro time utilized. Radiation dose 64.92 mGy Kerma. Please see performing physicians operative report for full details IMPRESSION: Fluoroscopic images were obtained for intraoperative guidance of carotid cutdown with brachiocephalic stent. Electronically Signed   By: Darrin Nipper M.D.   On: 04/14/2022 13:13   DG C-Arm 1-60 Min-No Report  Result Date: 04/14/2022 Fluoroscopy was utilized by the requesting physician.  No radiographic interpretation.   DG C-Arm 1-60 Min-No Report  Result Date: 04/14/2022 Fluoroscopy was utilized by the requesting physician.  No radiographic interpretation.     PHYSICAL EXAM  Temp:  [97.9 F (36.6 C)-99 F (37.2 C)] 99 F (37.2 C) (01/11 0836) Pulse Rate:  [89-101] 101 (01/11 0836) Resp:  [15-20] 20 (01/11 0836) BP: (130-144)/(77-98) 139/98 (01/11 0836) SpO2:  [97 %-100 %] 97 % (01/11 0836)  General - obese, well developed, in no apparent distress.  Ophthalmologic - fundi not visualized due to noncooperation.  Cardiovascular - Regular rhythm and rate.  Mental Status -  Level of arousal and orientation to time, place, and person were intact. Language including expression, naming, repetition, comprehension was assessed and found intact. Attention span and concentration were normal. Fund of Knowledge was assessed and was intact.  Cranial Nerves II - XII - II - Visual field intact OU. III, IV, VI - Extraocular movements intact. V - Facial sensation intact bilaterally. VII - Facial movement intact bilaterally. VIII - Hearing & vestibular intact bilaterally. X - Palate elevates symmetrically. XI - Chin turning & shoulder shrug intact bilaterally. XII - Tongue protrusion intact.  Motor Strength - The patient's strength was normal in all extremities and pronator drift was absent.  Bulk was normal and fasciculations were absent.   Motor Tone - Muscle tone was assessed at the neck and appendages and was normal.  Reflexes - The patient's reflexes were  symmetrical in all extremities and she had no pathological reflexes.  Sensory - Light touch, temperature/pinprick were assessed and were symmetrical.    Coordination - The patient had normal movements in the hands and feet with no ataxia or dysmetria.  Tremor was absent.  Gait and Station - deferred.    ASSESSMENT/PLAN [name] is a 47 y.o. female with history of hypertension and smoking.  Multiple areas of diffusion restriction in this patient who has had a longstanding history of transient neurological symptoms concerning for demyelinating disease such as multiple sclerosis. However she does also have some stroke risk factors such as hypertension, obesity.    She has frequent transient neurological symptoms of numbness and weakness that are transient and self resolved.  On 04/09/22 she developed sudden onset left upper extremity weakness.  Today she notes the L arm and leg feel back to full strength and sensation.  During stroke workup, noted R brachiocephalic artery clot, now on Heparin IV.  Hypercoag labs still pending.  Lower dopplers pending.   She has had extensive prior neurological evaluation for abnormal MRI scan showing nonspecific T2/FLAIR white matter hyperintensities raising concern for multiple sclerosis but essentially negative workup for the same including lumbar puncture most recently 05/05/2021 which was normal   Stroke - Multiple bilateral parietal occipital lobe subacute small  infarcts, sembolic pattern, cryptogenic CT head -No acute intracranial abnormality.  CTA head & neck Filling defect in the brachiocephalic artery, concerning for intraluminal thrombus. MRI  brain- Multiple small areas of diffusion hyperintense signal in the occipital parietal lobes bilaterally. 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 CTA repeat nonocclusive brachiocephalic artery thrombus Echo bubble study EF  55% LE venous doppler no DVT TCD bubble study negative for PFO Will consider TEE to complete the work up. Now scheduled as outpt Hypercoagulable and autoimmune work up neg   LDL 68 A1C 6.0 UDS positive for THC No antithrombotics PTA, now on heparin gtt and ASA. Per Dr. Virl Cagey, will transition to eliquis and ASA for one month. 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  Started on IV heparin. Lower extremity Doppler studies no DVT  Hypercoag Panel and ANA neg  Repeat CTA nonocclusive brachiocephalic artery thrombus S/p stenting 04/14/22 with Dr. Unk Lightning [facility] TEE will schedule as outpt heparin IVand ASA will transition to eliquis and ASA for one month. Then repeat CTA in one month, will decide further regimen based on the result  ? Concern for  MS Due to lesion locations in brain imaging MRI cervical spine-Mild degenerative change in the cervical spine.  MRI thoracic-Mild thoracic degenerative change.  CSF neg including oligoclonal band MRI brain WM changes likely ischemic changes Follows with Dr. Leta Baptist at [facility]  Hypertension Home meds:  amlodipine '5mg'$  stable Long-term BP goal normotensive  Hyperlipidemia Home meds:  none LDL 68, goal < 70 Add crestor 10 No high intensity statin given LDL at goal Continue statin on discharge.  Other Stroke Risk Factors Former smoker, quit 4 years ago Substance abuse - UDS:  THC POSITIVE. Patient advised to stop using due to stroke risk. Obesity, Body mass index is 37.93 kg/m., BMI >/= 30 associated with increased stroke risk, recommend weight loss, diet and exercise as appropriate  ? Obstructive sleep apnea: sleep study completed last night inpatient Migraine history  Other Active Problems Thyroid nodules bil: incidental finding on Cervical MRI, [place] completed 04/10/22.  Biopsy will need to be done in  the outpatient setting in the next few weeks.    Hospital day # 6  Neurology will sign off. Please call with questions. Pt will follow up with Dr. Leta Baptist at [facility] in about 4 weeks. Thanks for the consult.   Rosalin Hawking, MD PhD Stroke Neurology 04/16/2022 9:44 AM    To contact Stroke Continuity provider, please refer to [email or web address] After hours, contact General Neurology

## 2022-04-16 NOTE — Progress Notes (Signed)
I have spoken with and evaluated the patient. She reported some soreness in the fingertips in her right hand that started this morning. This soreness is still present this afternoon but less. She does not have any motor or sensory deficits in the arm. She still has a palpable radial pulse. I do not believe anything has changed in her exam or have concern that something new has happened since her stent placement.  She is still stable for discharge from a vascular standpoint. She will follow up with Dr.Robins in 4 weeks with repeat CTA of the chest. She will continue her DOAC and ASA at discharge.  Vicente Serene, PA-C [facility] [place] [phone]

## 2022-04-16 NOTE — Progress Notes (Signed)
ANTICOAGULATION CONSULT NOTE - Initial Consult  Pharmacy Consult for transition to apixaban (from heparin infusion) Indication:  arterial thrombus  No Known Allergies  Patient Measurements: Height: '5\' 6"'$  (167.6 cm) Weight: 106.6 kg (235 lb 0.2 oz) IBW/kg (Calculated) : 59.3  Vital Signs: Temp: 99 F (37.2 C) (01/11 0836) Temp Source: Oral (01/11 0836) BP: 139/98 (01/11 0836) Pulse Rate: 101 (01/11 0836)  Labs: Recent Labs    04/14/22 0545 04/15/22 0215 04/16/22 0056  HGB 11.2* 8.9* 8.8*  HCT 37.3 28.4* 29.6*  PLT 493* 363 361  HEPARINUNFRC 0.50 0.46 0.43  CREATININE 0.84 0.76 0.92    Estimated Creatinine Clearance: 94.3 mL/min (by C-G formula based on SCr of 0.92 mg/dL).   Medical History: Past Medical History:  Diagnosis Date   Brain lesion    COVID-19 04/2020   Diplopia    Hypertension    Iron deficiency anemia     Medications:  Medications Prior to Admission  Medication Sig Dispense Refill Last Dose   amLODipine (NORVASC) 5 MG tablet Take 1 tablet (5 mg total) by mouth daily. (Patient taking differently: Take 5 mg by mouth in the morning.) 30 tablet 2 04/09/2022   ferrous sulfate 325 (65 FE) MG EC tablet Take 1 tablet (325 mg total) by mouth 2 (two) times daily. (Patient taking differently: Take 1 tablet by mouth daily with breakfast.) 60 tablet 3 04/09/2022   ibuprofen (ADVIL) 200 MG tablet Take 800 mg by mouth daily as needed for headache, mild pain or moderate pain.   04/09/2022   azelastine (OPTIVAR) 0.05 % ophthalmic solution Place 1 drop into both eyes 2 (two) times daily as needed. (Patient not taking: Reported on 04/09/2022) 6 mL 1 Not Taking   baclofen (LIORESAL) 10 MG tablet Take 1 tablet (10 mg total) by mouth 3 (three) times daily. (Patient not taking: Reported on 04/09/2022) 30 each 0 Not Taking   butalbital-acetaminophen-caffeine (FIORICET) 50-325-40 MG tablet Take 1 tablet by mouth every 8 (eight) hours as needed for headache. (Patient not taking: Reported  on 04/09/2022) 2 tablet 0 Not Taking   ibuprofen (ADVIL) 800 MG tablet Take 1 tablet (800 mg total) by mouth every 8 (eight) hours as needed. (Patient not taking: Reported on 04/09/2022) 30 tablet 1 Not Taking   metroNIDAZOLE (FLAGYL) 500 MG tablet Take 1 tablet (500 mg total) by mouth 2 (two) times daily. (Patient not taking: Reported on 04/09/2022) 14 tablet 0 Not Taking   prochlorperazine (COMPAZINE) 10 MG tablet Take 1 tablet (10 mg total) by mouth every 8 (eight) hours as needed (nausea or headache). (Patient not taking: Reported on 04/09/2022) 20 tablet 0 Not Taking   trimethoprim-polymyxin b (POLYTRIM) ophthalmic solution Place 1 drop into the right eye every 4 (four) hours. X 5 days (Patient not taking: Reported on 04/09/2022) 10 mL 0 Not Taking   valACYclovir (VALTREX) 500 MG tablet Take 1 tablet (500 mg total) by mouth 2 (two) times daily. For herpes outbreak (Patient not taking: Reported on 04/09/2022) 6 tablet 2 Not Taking    Assessment: 47yo female admitted with multiple bilateral parietal occipital lobe subacute infarcts and MRI revealing intraluminal thrombus in the R brachiocephalic artery. Was started on heparin with a conservative goal and no bolus. 1/7 CT angio chest: re-demonstrated nonocclusive left brachiocephalic artery thrombus. Now s/p stenting procedure 04/14/22. Plan to transition to aspirin and apixaban for one month. Then repeat CTA for decision on continued regimen. Not on anticoagulation prior to admission.    Goal of Therapy:  Therapeutic anticoagulation Monitor platelets by anticoagulation protocol: Yes   Plan:  Stop heparin Start apixaban 10 mg PO twice daily x 7 days, followed by 5 mg PO twice daily Monitor for signs and symptoms of bleeding   Thank you for allowing [place] to participate in this patients care. Jens Som, PharmD 04/16/2022 10:15 AM  **Pharmacist phone directory can be found on [email or web address] listed under [place]**

## 2022-04-16 NOTE — Progress Notes (Addendum)
[facility] PROGRESS NOTE   Connie Herrera:[id number] DOB: [birth year] DOA: 04/09/2022  PCP: Camillia Herter, NP  Brief History/Interval Summary: 47 y.o. female with a past medical history of essential hypertension, previous history of transient numbness and weakness which have resolved spontaneously.  She has been seen by neurology previously.  Underwent lumbar puncture in January 2023.  She was in her usual state of health till this morning when she went to see her primary care provider.  This was for a routine visit.  She came back to her house and then noticed that her left arm was weak.  She had similar symptoms about a week ago but thought that weakness resolved spontaneously. In the emergency department she underwent MRI brain which showed multiple small areas of diffusion hyperintense signal in the occipital parietal lobes bilaterally.  Chronic changes were also noted.  Etiology for these findings were not clear.  Neurology was consulted.    Consultants: Neurology.  Vascular surgery  Procedures:  Lumbar puncture  On 11/04/17: Brachiocephalic artery stenting using 11 x 39 mm Gore VBX  TEE is pending    Subjective/Interval History: Patient mentions that she had a better day yesterday.  Denies any pain.  Has not had a bowel movement yet.  No nausea or vomiting.  Okay with outpatient TEE.     Assessment/Plan:  Acute stroke Presented with left arm weakness.  Per neurology embolic etiology suspected.   There was also concern for demyelinating process.  Patient underwent lumbar puncture.  Underwent MRI brain cervical spine and thoracic spine. Patient was seen by neurology. LDL noted to be 68.  HbA1c is 6.0. TSH 2.4.  RPR nonreactive.  HIV nonreactive Echocardiogram shows normal systolic function with grade 1 diastolic dysfunction. No needs identified by physical and Occupational Therapy. TEE recommended by neurology.  The earliest available inpatient slot is on  1/15.  Patient okay with pursuing outpatient TEE.  Cardiology was notified.  They will schedule outpatient TEE.  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  Started on IV heparin. CT angiogram was repeated and it continued to show the thrombus. Lower extremity Doppler study negative for DVT. Hypercoagulable panel was sent by neurology.  Anticardiolipin antibody is still not elevated.  Beta-2 glycoprotein is not elevated.  Protein C is 129 which is in the normal range.  Protein S is also in the normal range.  Antithrombin activity is in the normal range.  No lupus anticoagulant was detected. Underwent brachiocephalic artery stenting on 1/9. Discussed with Dr. Unk Lightning this morning.  Will transition to Eliquis plus aspirin for now.  He will follow-up with the patient in 1 month and decide on transition to dual antiplatelet therapy at that time.  Hemoglobin is stable this morning.  Essential hypertension Holding her medications for now.  Blood pressure is reasonably well-controlled.  Hypokalemia Supplemented.  Hyponatremia Stable.  Continue to monitor.  Microcytic anemia/iron deficiency This is as a result of chronic menorrhagia.   Anemia panel shows ferritin of 5, iron of 26, TIBC 533, percent saturation 5.  Vitamin B12 level is 147, folic acid 82.9. Will order iron infusion. Drop in hemoglobin noted from 11.2 to 8.9.  No overt bleeding noted.  Hemoglobin is stable this morning.  Will transition to Eliquis today and recheck labs again tomorrow.   Left thyroid nodule Imaging studies suggested nodules in both lobes.  Ultrasound was done which revealed a nodule of concern in the left thyroid lobe.  They recommend a biopsy.  TSH is normal. This was communicated to the patient.  Due to more urgent acute issues this was not pursued during this hospital stay.  Patient will be on anticoagulation for a month.  And then she will likely be on dual antiplatelet  treatment.  Biopsy to be pursuedin the next 2-3 months depending on her progress and what anticoagulant/antiplatelet agent she is on at that time.    Menorrhagia/uterine fibroids Has not followed up with gynecology in more than a year.  Patient encouraged to follow-up with her gynecologist.  If she does get placed on oral anticoagulants at discharge then it would increase her risk of significant menorrhagia.  Situational anxiety and insomnia Continue with the low-dose alprazolam as needed and melatonin for insomnia.  Constipation Remains constipated.  Will increase the dose of her laxatives.  Enema x 1.  Obesity Estimated body mass index is 37.93 kg/m as calculated from the following:   Height as of this encounter: '5\' 6"'$  (1.676 m).   Weight as of this encounter: 106.6 kg.   DVT Prophylaxis: On IV heparin.  Will be changed over to Eliquis today. Code Status: Full code Family Communication: Discussed with the patient Disposition Plan: Hopefully return home when improved.  Mobilize.  Anticipate discharge tomorrow, 1/12.  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     Medications: Scheduled:  aspirin EC  81 mg Oral Q0600   docusate sodium  100 mg Oral Daily   melatonin  3 mg Oral QHS   pantoprazole  40 mg Oral Daily   polyethylene glycol  17 g Oral BID   rosuvastatin  10 mg Oral Daily   senna-docusate  2 tablet Oral BID   Continuous:  sodium chloride     sodium chloride     heparin 1,650 Units/hr (04/16/22 0600)   magnesium sulfate bolus IVPB     promethazine (PHENERGAN) injection (IM or IVPB) Stopped (04/14/22 2137)   YOV:ZCHYIF chloride, acetaminophen **OR** acetaminophen (TYLENOL) oral liquid 160 mg/5 mL **OR** acetaminophen, ALPRAZolam, alum & mag hydroxide-simeth, gadobutrol, guaiFENesin-dextromethorphan, hydrALAZINE, labetalol, magnesium sulfate bolus IVPB, metoprolol tartrate, morphine injection,  ondansetron, oxyCODONE, phenol, potassium chloride, promethazine (PHENERGAN) injection (IM or IVPB)  Antibiotics: Anti-infectives (From admission, onward)    Start     Dose/Rate Route Frequency Ordered Stop   04/14/22 2000  ceFAZolin (ANCEF) IVPB 2g/100 mL premix        2 g 200 mL/hr over 30 Minutes Intravenous Every 8 hours 04/14/22 1626 04/15/22 0441       Objective:  Vital Signs  Vitals:   04/15/22 2010 04/15/22 2337 04/16/22 0550 04/16/22 0836  BP: (!) 143/90 130/77 (!) 140/91 (!) 139/98  Pulse:    (!) 101  Resp: '20 18 20 20  '$ Temp: 98.4 F (36.9 C) 98.2 F (36.8 C) 97.9 F (36.6 C) 99 F (37.2 C)  TempSrc: Oral Oral Oral Oral  SpO2: 100% 100% 98% 97%  Weight:      Height:        Intake/Output Summary (Last 24 hours) at 04/16/2022 0902 Last data filed at 04/16/2022 0848 Gross per 24 hour  Intake 1722.26 ml  Output --  Net 1722.26 ml    Filed Weights   04/09/22 2023  Weight: 106.6 kg    General appearance: Awake alert.  In no distress Resp: Clear to auscultation bilaterally.  Normal effort Cardio: S1-S2 is normal regular.  No S3-S4.  No rubs murmurs or bruit  GI: Abdomen is soft.  Nontender nondistended.  Bowel sounds are present normal.  No masses organomegaly Extremities: No edema.  Full range of motion of lower extremities. Neurologic: Alert and oriented x3.  No focal neurological deficits.     Lab Results:  Data Reviewed: I have personally reviewed following labs and reports of the imaging studies  CBC: Recent Labs  Lab 04/09/22 1315 04/09/22 1322 04/12/22 1202 04/13/22 0258 04/14/22 0545 04/15/22 0215 04/16/22 0056  WBC 7.2   < > 7.5 7.6 7.2 9.7 7.8  NEUTROABS 5.3  --  5.1  --   --   --   --   HGB 10.1*   < > 10.6* 10.1* 11.2* 8.9* 8.8*  HCT 34.5*   < > 34.1* 34.1* 37.3 28.4* 29.6*  MCV 76.2*   < > 72.6* 74.1* 73.7* 72.3* 74.7*  PLT 424*   < > 410* 410* 493* 363 361   < > = values in this interval not displayed.     Basic Metabolic  Panel: Recent Labs  Lab 04/10/22 0443 04/11/22 0828 04/13/22 0258 04/14/22 0545 04/15/22 0215 04/16/22 0056  NA 133* 135 134* 133* 132* 135  K 3.3* 3.4* 3.4* 4.1 3.7 3.8  CL 101 103 102 103 99 101  CO2 24 19* 21* 21* 23 24  GLUCOSE 101* 109* 97 122* 125* 105*  BUN 5* '7 9 10 8 9  '$ CREATININE 0.68 0.78 0.76 0.84 0.76 0.92  CALCIUM 8.7* 8.5* 8.8* 8.9 8.5* 8.5*  MG 1.8 1.8 1.8  --  2.0 2.0     GFR: Estimated Creatinine Clearance: 94.3 mL/min (by C-G formula based on SCr of 0.92 mg/dL).  Liver Function Tests: Recent Labs  Lab 04/09/22 1315 04/10/22 0443  AST 20 22  ALT 24 24  ALKPHOS 76 77  BILITOT <0.1* 0.4  PROT 6.4* 6.8  ALBUMIN 3.4* 3.3*      Coagulation Profile: Recent Labs  Lab 04/09/22 1315  INR 1.0      CBG: Recent Labs  Lab 04/09/22 1038  GLUCAP 101*      Recent Results (from the past 240 hour(s))  CSF culture w Gram Stain     Status: None   Collection Time: 04/09/22  5:08 PM   Specimen: PATH Cytology CSF; Cerebrospinal Fluid  Result Value Ref Range Status   Specimen Description CSF  Final   Special Requests NONE  Final   Gram Stain NO WBC SEEN NO ORGANISMS SEEN CYTOSPIN SMEAR   Final   Culture   Final    NO GROWTH Performed at [facility], [place] [address], [place], [place] [address]    Report Status 04/12/2022 FINAL  Final  Culture, fungus without smear     Status: None (Preliminary result)   Collection Time: 04/09/22  5:08 PM   Specimen: PATH Cytology CSF; Cerebrospinal Fluid  Result Value Ref Range Status   Specimen Description CSF  Final   Special Requests NONE  Final   Culture   Final    NO FUNGUS ISOLATED AFTER 6 DAYS Performed at [facility] [address] [place],  [address]    Report Status PENDING  Incomplete  Surgical pcr screen     Status: None   Collection Time: 04/14/22  6:20 AM   Specimen: Nasal Mucosa; Nasal Swab  Result Value Ref Range Status   MRSA, PCR NEGATIVE NEGATIVE Final    Staphylococcus aureus NEGATIVE NEGATIVE Final    Comment: (NOTE) The Xpert SA  Assay (FDA approved for NASAL specimens in patients 56 years of age and older), is one component of a comprehensive surveillance program. It is not intended to diagnose infection nor to guide or monitor treatment. Performed at [facility], [place] [address] [place], [place] [address]       Radiology Studies: DG C-Arm 1-60 Min  Result Date: 04/14/2022 CLINICAL DATA:  Carotid cutdown with brachiocephalic stent EXAM: DG C-ARM 1-60 MIN COMPARISON:  CTA chest dated 04/12/2022 FINDINGS: Four fluoroscopic images and 6 fluoroscopic cines obtained during carotid cutdown with brachiocephalic stent. 3 minutes 33 seconds fluoro time utilized. Radiation dose 64.92 mGy Kerma. Please see performing physicians operative report for full details IMPRESSION: Fluoroscopic images were obtained for intraoperative guidance of carotid cutdown with brachiocephalic stent. Electronically Signed   By: Darrin Nipper M.D.   On: 04/14/2022 13:13   DG C-Arm 1-60 Min-No Report  Result Date: 04/14/2022 Fluoroscopy was utilized by the requesting physician.  No radiographic interpretation.   DG C-Arm 1-60 Min-No Report  Result Date: 04/14/2022 Fluoroscopy was utilized by the requesting physician.  No radiographic interpretation.       LOS: 6 days   Celisse Ciulla [employer] on [email or web address]  04/16/2022, 9:02 AM

## 2022-04-16 NOTE — Discharge Instructions (Addendum)
Information on my medicine - ELIQUIS (apixaban)  This medication education was reviewed with me or my healthcare representative as part of my discharge preparation.    Why was Eliquis prescribed for you? Eliquis was prescribed to treat blood clots that may have been found in the veins of your legs (deep vein thrombosis) or in your lungs (pulmonary embolism) and to reduce the risk of them occurring again.  What do You need to know about Eliquis ? The starting dose is 10 mg (two 5 mg tablets) taken TWICE daily for the FIRST SEVEN (7) DAYS, then on April 23, 2022  the dose is reduced to ONE 5 mg tablet taken TWICE daily.  Eliquis may be taken with or without food.   Try to take the dose about the same time in the morning and in the evening. If you have difficulty swallowing the tablet whole please discuss with your pharmacist how to take the medication safely.  Take Eliquis exactly as prescribed and DO NOT stop taking Eliquis without talking to the doctor who prescribed the medication.  Stopping may increase your risk of developing a new blood clot.  Refill your prescription before you run out.  After discharge, you should have regular check-up appointments with your healthcare provider that is prescribing your Eliquis.    What do you do if you miss a dose? If a dose of ELIQUIS is not taken at the scheduled time, take it as soon as possible on the same day and twice-daily administration should be resumed. The dose should not be doubled to make up for a missed dose.  Important Safety Information A possible side effect of Eliquis is bleeding. You should call your healthcare provider right away if you experience any of the following: Bleeding from an injury or your nose that does not stop. Unusual colored urine (red or dark brown) or unusual colored stools (red or black). Unusual bruising for unknown reasons. A serious fall or if you hit your head (even if there is no bleeding).  Some  medicines may interact with Eliquis and might increase your risk of bleeding or clotting while on Eliquis. To help avoid this, consult your healthcare provider or pharmacist prior to using any new prescription or non-prescription medications, including herbals, vitamins, non-steroidal anti-inflammatory drugs (NSAIDs) and supplements.  This website has more information on Eliquis (apixaban): http://[email or web address].com/eliquis/home

## 2022-04-17 ENCOUNTER — Telehealth (HOSPITAL_COMMUNITY): Payer: Self-pay | Admitting: Pharmacy Technician

## 2022-04-17 ENCOUNTER — Other Ambulatory Visit (HOSPITAL_COMMUNITY): Payer: Self-pay

## 2022-04-17 DIAGNOSIS — I639 Cerebral infarction, unspecified: Secondary | ICD-10-CM | POA: Diagnosis not present

## 2022-04-17 LAB — BASIC METABOLIC PANEL
Anion gap: 8 (ref 5–15)
BUN: 11 mg/dL (ref 6–20)
CO2: 24 mmol/L (ref 22–32)
Calcium: 8.7 mg/dL — ABNORMAL LOW (ref 8.9–10.3)
Chloride: 102 mmol/L (ref 98–111)
Creatinine, Ser: 0.7 mg/dL (ref 0.44–1.00)
GFR, Estimated: >60 mL/min (ref >60)
Glucose, Bld: 113 mg/dL — ABNORMAL HIGH (ref 70–99)
Potassium: 3.6 mmol/L (ref 3.5–5.1)
Sodium: 134 mmol/L — ABNORMAL LOW (ref 135–145)

## 2022-04-17 LAB — CBC
HCT: 28.4 % — ABNORMAL LOW (ref 36.0–46.0)
Hemoglobin: 8.9 g/dL — ABNORMAL LOW (ref 12.0–15.0)
MCH: 22.9 pg — ABNORMAL LOW (ref 26.0–34.0)
MCHC: 31.3 g/dL (ref 30.0–36.0)
MCV: 73 fL — ABNORMAL LOW (ref 80.0–100.0)
Platelets: 335 10*3/uL (ref 150–400)
RBC: 3.89 MIL/uL (ref 3.87–5.11)
RDW: 16.7 % — ABNORMAL HIGH (ref 11.5–15.5)
WBC: 7.9 10*3/uL (ref 4.0–10.5)
nRBC: 0 % (ref 0.0–0.2)

## 2022-04-17 MED ORDER — PANTOPRAZOLE SODIUM 40 MG PO TBEC
40.0000 mg | DELAYED_RELEASE_TABLET | Freq: Every day | ORAL | 0 refills | Status: DC
  Filled 2022-04-17: qty 30, 30d supply, fill #0

## 2022-04-17 MED ORDER — TRAMADOL HCL 50 MG PO TABS
50.0000 mg | ORAL_TABLET | Freq: Four times a day (QID) | ORAL | 0 refills | Status: DC | PRN
  Filled 2022-04-17: qty 12, 3d supply, fill #0

## 2022-04-17 MED ORDER — ROSUVASTATIN CALCIUM 10 MG PO TABS
10.0000 mg | ORAL_TABLET | Freq: Every day | ORAL | 0 refills | Status: DC
  Filled 2022-04-17: qty 30, 30d supply, fill #0

## 2022-04-17 MED ORDER — ASPIRIN 81 MG PO TBEC
81.0000 mg | DELAYED_RELEASE_TABLET | Freq: Every day | ORAL | 0 refills | Status: AC
  Filled 2022-04-17: qty 30, 30d supply, fill #0

## 2022-04-17 MED ORDER — DOCUSATE SODIUM 100 MG PO CAPS
100.0000 mg | ORAL_CAPSULE | Freq: Every day | ORAL | 0 refills | Status: DC
  Filled 2022-04-17: qty 10, 10d supply, fill #0

## 2022-04-17 MED ORDER — APIXABAN (ELIQUIS) VTE STARTER PACK (10MG AND 5MG)
ORAL_TABLET | ORAL | 0 refills | Status: DC
  Filled 2022-04-17: qty 74, 30d supply, fill #0

## 2022-04-17 MED ORDER — ONDANSETRON HCL 4 MG PO TABS: 4.0000 mg | ORAL_TABLET | Freq: Three times a day (TID) | ORAL | 0 refills | Status: DC | PRN

## 2022-04-17 MED ORDER — ONDANSETRON HCL 4 MG PO TABS
4.0000 mg | ORAL_TABLET | Freq: Three times a day (TID) | ORAL | 0 refills | Status: DC | PRN
  Filled 2022-04-17: qty 20, 7d supply, fill #0

## 2022-04-17 MED ORDER — SENNOSIDES-DOCUSATE SODIUM 8.6-50 MG PO TABS
2.0000 | ORAL_TABLET | Freq: Two times a day (BID) | ORAL | 0 refills | Status: AC
  Filled 2022-04-17: qty 56, 14d supply, fill #0

## 2022-04-17 NOTE — Hospital Course (Addendum)
47 y.o. female with a past medical history of essential hypertension, previous history of transient numbness and weakness which have resolved spontaneously.  She has been seen by neurology previously.  Underwent lumbar puncture in January 2023.  She was in her usual state of health till this morning when she went to see her primary care provider.  This was for a routine visit.  She came back to her house and then noticed that her left arm was weak.  She had similar symptoms about a week ago but thought that weakness resolved spontaneously. In the emergency department she underwent MRI brain which showed multiple small areas of diffusion hyperintense signal in the occipital parietal lobes bilaterally.  Chronic changes were also noted.  Etiology for these findings were not clear.  Neurology was consulted.   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  Heparin was transitioned to Eliquis.  Patient need further rest workup with TEE which is scheduled for outpatient on 1/15 which cardio will arrange. Patient completed stroke workup CT head CTA head and neck MRI brain with multiple small areas of diffusion hyperintense signal in the occipital parietal lobes bilaterally CT nonocclusive brachiocephalic artery thrombosis, echo bubble study EF 55% Doppler negative for DVT TCD bubble study negative for PFO, LDL 68 HbA1c 6.0, UDS positive for THC hypercoagulable workup and autoimmune workup was negative.  Plan is to continue Eliquis and aspirin for 1 month then repeat CTA in 1 month to decide further regimen

## 2022-04-17 NOTE — Telephone Encounter (Signed)
Patient Advocate Encounter  Prior Authorization for Pantoprazole Sodium '40MG'$  dr tablets has been approved.    Key: [id number] Effective dates: 04/17/2022 through 04/16/2023     Lyndel Safe, [place] Patient Advocate Specialist [place] Patient Advocate Team Direct Number: [phone]  Fax: [phone]

## 2022-04-17 NOTE — Discharge Summary (Signed)
Physician Discharge Summary  Connie Herrera FAO:[id number] DOB: [birth year] DOA: 04/09/2022  PCP: Camillia Herter, NP  Admit date: 04/09/2022 Discharge date: 04/17/2022 Recommendations for Outpatient Follow-up:  Follow up with PCP in 1 weeks-call for appointment You will need biopsy of the thyroid nodules follow-up with PCP in a month Check CBC BMP in 1 week from PCP Follow-up with Dr. Virl Cagey vascular surgery to repeat a CTA Follow-up with neurology for possible stroke follow-up in 2 to 3 weeks  Discharge Dispo: Home Discharge Condition: Stable Code Status:   Code Status: Full Code Diet recommendation:  Diet Order             Diet Heart Room service appropriate? Yes with Assist; Fluid consistency: Thin  Diet effective now                 Brief/Interim Summary: 47 y.o. female with a past medical history of essential hypertension, previous history of transient numbness and weakness which have resolved spontaneously.  She has been seen by neurology previously.  Underwent lumbar puncture in January 2023.  She was in her usual state of health till this morning when she went to see her primary care provider.  This was for a routine visit.  She came back to her house and then noticed that her left arm was weak.  She had similar symptoms about a week ago but thought that weakness resolved spontaneously. In the emergency department she underwent MRI brain which showed multiple small areas of diffusion hyperintense signal in the occipital parietal lobes bilaterally.  Chronic changes were also noted.  Etiology for these findings were not clear.  Neurology was consulted.   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  Heparin was transitioned to Eliquis.  Patient need further rest workup with TEE which is scheduled for outpatient on 1/15 which cardio will arrange. Patient completed stroke workup CT head CTA head and neck  MRI brain with multiple small areas of diffusion hyperintense signal in the occipital parietal lobes bilaterally CT nonocclusive brachiocephalic artery thrombosis, echo bubble study EF 55% Doppler negative for DVT TCD bubble study negative for PFO, LDL 68 HbA1c 6.0, UDS positive for THC hypercoagulable workup and autoimmune workup was negative.  Plan is to continue Eliquis and aspirin for 1 month then repeat CTA in 1 month to decide further regimen   Discharge Diagnoses:  Principal Problem:   Acute CVA (cerebrovascular accident) ([place]) Active Problems:   Hypertension   Anemia due to blood loss, chronic   Left arm weakness   Iron deficiency anemia   Hypokalemia   Arterial thrombosis (HCC)  Acute stroke:completed stroke workup CT head CTA head and neck MRI brain with multiple small areas of diffusion hyperintense signal in the occipital parietal lobes bilaterally CT nonocclusive brachiocephalic artery thrombosis, echo bubble study EF 55% Doppler negative for DVT TCD bubble study negative for PFO, LDL 68 HbA1c 6.0, UDS positive for THC hypercoagulable workup and autoimmune workup was negative. ?  Demyelinating process.  Underwent lumbar puncture on MRI brain cervical spine and thoracic spine.  Plan is to continue TEE as outpatient, Eliquis, aspirin.   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 DVT neg in LE , Hypercoagulable panel AA work up neg >  s/p brachiocephalic artery stenting on 1/9> Dr. Unk Lightning transitioned to Eliquis plus aspirin for now with plan to  follow-up in 1 month and decide on transition to dual antiplatelet therapy at that time.  Hemoglobin has remained stable.   Essential hypertension: Blood pressure remains well-controlled without medication follow-up with PCP to resume Hypokalemia: Resolved HyponatremiaStable.  Microcytic anemia/iron deficiency: 2/2 chronic menorrhagia.  Underwent anemia panel  workup that showed> ferritin of 5, iron of 26, TIBC 533, percent saturation 5.  Vitamin B12 level is 824, folic acid 23.5.  S/P ferric gluconate infusion completed  Left thyroid nodule:Imaging studies suggested nodules in both lobes.  Ultrasound was done which revealed a nodule of concern in the left thyroid lobe.  They recommend a biopsy.  TSH is normal. This was communicated to the patient.  Due to more urgent acute issues this was not pursued during this hospital stay.  Patient will be on anticoagulation for a month.  And then she will likely be on dual antiplatelet treatment.  Biopsy to be pursuedin the next 2-3 months depending on her progress and what anticoagulant/antiplatelet agent she is on at that time.     Menorrhagia/uterine fibroids:Has not followed up with gynecology in more than a year.  Patient encouraged to follow-up with her gynecologist.  If she does get placed on oral anticoagulants at discharge then it would increase her risk of significant menorrhagia.   Situational anxiety and insomnia:Continue with the low-dose alprazolam as needed and melatonin for insomnia.   Constipation: Continue laxatives.  Consults: Vascular  Subjective: Aaox3, some pain on neck. No numbness in fingers. Feels ready for dc home  Discharge Exam: Vitals:   04/17/22 0318 04/17/22 0805  BP: 133/77 (!) 153/95  Pulse: 89   Resp: 20 18  Temp: 98 F (36.7 C) 98.4 F (36.9 C)  SpO2: 98%    General: Pt is alert, awake, not in acute distress Cardiovascular: RRR, S1/S2 +, no rubs, no gallops Respiratory: CTA bilaterally, no wheezing, no rhonchi Abdominal: Soft, NT, ND, bowel sounds + Extremities: no edema, no cyanosis  Discharge Instructions  Discharge Instructions     Ambulatory referral to Neurology   Complete by: As directed    Follow up with Dr. Leta Baptist at [facility] in 4 weeks. Pt is Dr. Gladstone Lighter pt. Thanks.   Discharge instructions   Complete by: As directed    You will need biopsy of  the thyroid nodules follow-up with PCP in a month Check CBC BMP in 1 week from PCP Follow-up with Dr. Virl Cagey vascular surgery to repeat a CTA  Please call call MD or return to ER for similar or worsening recurring problem that brought you to hospital or if any fever,nausea/vomiting,abdominal pain, uncontrolled pain, chest pain,  shortness of breath or any other alarming symptoms.  Please follow-up your doctor as instructed in a week time and call the office for appointment.  Please avoid alcohol, smoking, or any other illicit substance and maintain healthy habits including taking your regular medications as prescribed.  You were cared for by a hospitalist during your hospital stay. If you have any questions about your discharge medications or the care you received while you were in the hospital after you are discharged, you can call the unit and ask to speak with the hospitalist on call if the hospitalist that took care of you is not available.  Once you are discharged, your primary care physician will handle any further medical issues. Please note that NO REFILLS for any discharge medications will be authorized once you are discharged, as it is imperative that you return to your primary care  physician (or establish a relationship with a primary care physician if you do not have one) for your aftercare needs so that they can reassess your need for medications and monitor your lab values   Increase activity slowly   Complete by: As directed       Allergies as of 04/17/2022   No Known Allergies      Medication List     STOP taking these medications    amLODipine 5 MG tablet Commonly known as: NORVASC   azelastine 0.05 % ophthalmic solution Commonly known as: OPTIVAR   butalbital-acetaminophen-caffeine 50-325-40 MG tablet Commonly known as: FIORICET   ibuprofen 200 MG tablet Commonly known as: ADVIL   ibuprofen 800 MG tablet Commonly known as: ADVIL   metroNIDAZOLE 500 MG  tablet Commonly known as: FLAGYL   trimethoprim-polymyxin b ophthalmic solution Commonly known as: Polytrim   valACYclovir 500 MG tablet Commonly known as: Valtrex       TAKE these medications    Apixaban Starter Pack ('10mg'$  and '5mg'$ ) Commonly known as: ELIQUIS STARTER PACK Take as directed on package: start with two-'5mg'$  tablets twice daily until 04/22/2021. On 04/23/22 switch to one-'5mg'$  tablet twice daily.   aspirin EC 81 MG tablet Take 1 tablet (81 mg total) by mouth daily at 6 (six) AM. Swallow whole.   baclofen 10 MG tablet Commonly known as: LIORESAL Take 1 tablet (10 mg total) by mouth 3 (three) times daily.   docusate sodium 100 MG capsule Commonly known as: COLACE Take 1 capsule (100 mg total) by mouth daily.   ferrous sulfate 325 (65 FE) MG EC tablet Take 1 tablet (325 mg total) by mouth 2 (two) times daily. What changed: when to take this   pantoprazole 40 MG tablet Commonly known as: PROTONIX Take 1 tablet (40 mg total) by mouth daily.   prochlorperazine 10 MG tablet Commonly known as: COMPAZINE Take 1 tablet (10 mg total) by mouth every 8 (eight) hours as needed (nausea or headache).   rosuvastatin 10 MG tablet Commonly known as: CRESTOR Take 1 tablet (10 mg total) by mouth daily.   senna-docusate 8.6-50 MG tablet Commonly known as: Senokot-S Take 2 tablets by mouth 2 (two) times daily for 14 days.   traMADol 50 MG tablet Commonly known as: Ultram Take 1 tablet (50 mg total) by mouth every 6 (six) hours as needed for up to 12 doses.        Follow-up Information     Broadus John, MD. Schedule an appointment as soon as possible for a visit in 1 month(s).   Specialty: Vascular Surgery Why: post hospitalization follow up Contact information: [place] [address] [phone]         Camillia Herter, NP Follow up in 1 week(s).   Specialty: Nurse Practitioner Why: post hospitalization follow up. discuss plan for thyroid nodule  biopsy Contact information: [place] [address] [phone]         Penni Bombard, MD. Schedule an appointment as soon as possible for a visit in 1 month(s).   Specialties: Neurology, Radiology Contact information: [address] [place] [address] [place], [place], NP Follow up in 1 week(s).   Specialty: Nurse Practitioner Contact information: [place] [address] [phone]                No Known Allergies  The results  of significant diagnostics from this hospitalization (including imaging, microbiology, ancillary and laboratory) are listed below for reference.    Microbiology: Recent Results (from the past 240 hour(s))  CSF culture w Gram Stain     Status: None   Collection Time: 04/09/22  5:08 PM   Specimen: PATH Cytology CSF; Cerebrospinal Fluid  Result Value Ref Range Status   Specimen Description CSF  Final   Special Requests NONE  Final   Gram Stain NO WBC SEEN NO ORGANISMS SEEN CYTOSPIN SMEAR   Final   Culture   Final    NO GROWTH Performed at [facility], [address], [place], [place] [address]    Report Status 04/12/2022 FINAL  Final  Culture, fungus without smear     Status: None (Preliminary result)   Collection Time: 04/09/22  5:08 PM   Specimen: PATH Cytology CSF; Cerebrospinal Fluid  Result Value Ref Range Status   Specimen Description CSF  Final   Special Requests NONE  Final   Culture   Final    NO FUNGUS ISOLATED AFTER 8 DAYS Performed at [facility], [place] [address] [place], [place] [address]    Report Status PENDING  Incomplete  Surgical pcr screen     Status: None   Collection Time: 04/14/22  6:20 AM   Specimen: Nasal Mucosa; Nasal Swab  Result Value Ref Range Status   MRSA, PCR NEGATIVE NEGATIVE Final   Staphylococcus aureus NEGATIVE NEGATIVE Final    Comment: (NOTE) The Xpert SA Assay (FDA approved for  NASAL specimens in patients 71 years of age and older), is one component of a comprehensive surveillance program. It is not intended to diagnose infection nor to guide or monitor treatment. Performed at [facility], [place] [address], [place], [place] [address]     Procedures/Studies: DG C-Arm 1-60 Min  Result Date: 04/14/2022 CLINICAL DATA:  Carotid cutdown with brachiocephalic stent EXAM: DG C-ARM 1-60 MIN COMPARISON:  CTA chest dated 04/12/2022 FINDINGS: Four fluoroscopic images and 6 fluoroscopic cines obtained during carotid cutdown with brachiocephalic stent. 3 minutes 33 seconds fluoro time utilized. Radiation dose 64.92 mGy Kerma. Please see performing physicians operative report for full details IMPRESSION: Fluoroscopic images were obtained for intraoperative guidance of carotid cutdown with brachiocephalic stent. Electronically Signed   By: Darrin Nipper M.D.   On: 04/14/2022 13:13   DG C-Arm 1-60 Min-No Report  Result Date: 04/14/2022 Fluoroscopy was utilized by the requesting physician.  No radiographic interpretation.   DG C-Arm 1-60 Min-No Report  Result Date: 04/14/2022 Fluoroscopy was utilized by the requesting physician.  No radiographic interpretation.   CT ANGIO CHEST AORTA W/CM & OR WO/CM  Result Date: 04/12/2022 CLINICAL DATA:  Acute CVA. Code stroke. Brachiocephalic thrombus seen on CT 04/09/2022. EXAM: CT ANGIOGRAPHY CHEST WITH CONTRAST TECHNIQUE: Multidetector CT imaging of the chest was performed using the standard protocol during bolus administration of intravenous contrast. Multiplanar CT image reconstructions and MIPs were obtained to evaluate the vascular anatomy. RADIATION DOSE REDUCTION: This exam was performed according to the departmental dose-optimization program which includes automated exposure control, adjustment of the mA and/or kV according to patient size and/or use of iterative reconstruction technique. CONTRAST:  157m OMNIPAQUE IOHEXOL 350 MG/ML SOLN  COMPARISON:  CTA head and neck 04/09/2022; chest radiographs 02/15/2022 FINDINGS: Cardiovascular: No aortic intramural hemotoma. Preferential opacification of the thoracic aorta. No evidence of thoracic aortic aneurysm or dissection. Normal heart size. No pericardial effusion. No central pulmonary embolism. Nonocclusive  left brachiocephalic artery thrombus was better visualized on CTA head and neck 04/09/2022 as it is largely obscured by streak artifact from dense contrast bolus in the left brachiocephalic vein. No aortic thrombus. The visualized portions of the right carotid and subclavian arteries are patent without thrombus. Mediastinum/Nodes: Left thyroid nodule better evaluated on ultrasound 04/10/2022. See that report for details. Unremarkable esophagus. No thoracic adenopathy by size. Lungs/Pleura: Lungs are clear. No pleural effusion or pneumothorax. Solid 4 mm nodule along the right major fissure (8/69). No routine follow-up imaging is recommended. Upper Abdomen: No acute abnormality. Musculoskeletal: No chest wall abnormality. No acute or significant osseous findings. Review of the MIP images confirms the above findings. IMPRESSION: Redemonstrated nonocclusive left brachiocephalic artery thrombus. This was better visualized on CTA head and neck 04/09/2022 as it is largely obscured by streak artifact from dense contrast bolus in the left brachiocephalic vein. No additional arterial thrombus is visualized. Electronically Signed   By: Placido Sou M.D.   On: 04/12/2022 21:50   VAS [place] TRANSCRANIAL DOPPLER W BUBBLES  Result Date: 04/12/2022  Transcranial Doppler with Bubble Patient Name:  Connie Herrera  Date of Exam:   04/11/2022 Medical Rec #: [id number]             Accession #:    [id number] Date of Birth: May 09, 1975            Patient Gender: F Patient Age:   64 years Exam Location:  [facility] Procedure:      VAS [place] TRANSCRANIAL DOPPLER W BUBBLES Referring Phys: Lesleigh Noe  --------------------------------------------------------------------------------  Indications: Stroke. History: Hypertension. History of tobacco use. Frequent transient numbness and weakness. Right brachiocephalic arterial clot by CT. New OSA diagnosis. Comparison Study: No prior study Performing Technologist: Sharion Dove RVS  Examination Guidelines: A complete evaluation includes B-mode imaging, spectral Doppler, color Doppler, and power Doppler as needed of all accessible portions of each vessel. Bilateral testing is considered an integral part of a complete examination. Limited examinations for reoccurring indications may be performed as noted.  Summary: No HITS at rest or during Valsalva. Negative transcranial Doppler Bubble study with no evidence of right to left intracardiac communication.  *See table(s) above for TCD measurements and observations.  Diagnosing physician: Rosalin Hawking MD Electronically signed by Rosalin Hawking MD on 04/12/2022 at 11:38:24 AM.    Final    VAS [place] LOWER EXTREMITY VENOUS (DVT)  Result Date: 04/12/2022  Lower Venous DVT Study Patient Name:  Connie Herrera  Date of Exam:   04/11/2022 Medical Rec #: [id number]             Accession #:    [id number] Date of Birth: 02/28/1976            Patient Gender: F Patient Age:   78 years Exam [facility]  [facility] Procedure:      VAS [place] LOWER EXTREMITY VENOUS (DVT) Referring Phys: Donnetta Simpers --------------------------------------------------------------------------------  Indications: Stroke.  Limitations: Patient unable to tolerate compressions secondary to pain. Comparison Study: Prior negative bilateral LEV done 10/03/20 Performing Technologist: Sharion Dove RVS  Examination Guidelines: A complete evaluation includes B-mode imaging, spectral Doppler, color Doppler, and power Doppler as needed of all accessible portions of each vessel. Bilateral testing is considered an integral part of a complete examination. Limited  examinations for reoccurring indications may be performed as noted. The reflux portion of the exam is performed with the patient in reverse Trendelenburg.  +---------+---------------+---------+-----------+----------+-------------------+ RIGHT    CompressibilityPhasicitySpontaneityPropertiesThrombus Aging      +---------+---------------+---------+-----------+----------+-------------------+  CFV      Full                                                             +---------+---------------+---------+-----------+----------+-------------------+ SFJ      Full                                                             +---------+---------------+---------+-----------+----------+-------------------+ FV Prox                 Yes      Yes                  patent by color and                                                       Doppler             +---------+---------------+---------+-----------+----------+-------------------+ FV Mid                  Yes      Yes                  patent by color and                                                       Doppler             +---------+---------------+---------+-----------+----------+-------------------+ FV Distal               Yes      Yes                  patent by color and                                                       Doppler             +---------+---------------+---------+-----------+----------+-------------------+ PFV                                                   Not well visualized +---------+---------------+---------+-----------+----------+-------------------+ POP                     Yes      Yes                  patent by color and  Doppler             +---------+---------------+---------+-----------+----------+-------------------+ PTV      Full                                                              +---------+---------------+---------+-----------+----------+-------------------+ PERO     Full                                                             +---------+---------------+---------+-----------+----------+-------------------+   +---------+---------------+---------+-----------+----------+-------------------+ LEFT     CompressibilityPhasicitySpontaneityPropertiesThrombus Aging      +---------+---------------+---------+-----------+----------+-------------------+ CFV                     Yes      Yes                  patent by color and                                                       Doppler             +---------+---------------+---------+-----------+----------+-------------------+ FV Prox                 Yes      Yes                  patent by color and                                                       Doppler             +---------+---------------+---------+-----------+----------+-------------------+ FV Mid                  Yes      Yes                  patent by color and                                                       Doppler             +---------+---------------+---------+-----------+----------+-------------------+ FV Distal               Yes      Yes                  patent by color and  Doppler             +---------+---------------+---------+-----------+----------+-------------------+ PFV                                                   Not well visualized +---------+---------------+---------+-----------+----------+-------------------+ POP                     Yes      Yes                  patent by color and                                                       Doppler             +---------+---------------+---------+-----------+----------+-------------------+ PTV      Full                                                              +---------+---------------+---------+-----------+----------+-------------------+ PERO     Full                                                             +---------+---------------+---------+-----------+----------+-------------------+     Summary: BILATERAL: - No obvious evidence of deep vein thrombosis seen in the visualized veins of the lower extremities, bilaterally. -No evidence of popliteal cyst, bilaterally.   *See table(s) above for measurements and observations. Electronically signed by Orlie Pollen on 04/12/2022 at 7:53:21 AM.    Final    US THYROID  Result Date: 04/10/2022 CLINICAL DATA:  Multiple thyroid nodules EXAM: THYROID ULTRASOUND TECHNIQUE: Ultrasound examination of the thyroid gland and adjacent soft tissues was performed. COMPARISON:  MRI cervical spine 04/09/2022 FINDINGS: Parenchymal Echotexture: Mildly heterogenous Isthmus: 0.8 cm Right lobe: 5.5 x 2.2 x 2.6 cm Left lobe: 6.2 x 2.3 x 2.4 cm _________________________________________________________ Estimated total number of nodules >/= 1 cm: 1 Number of spongiform nodules >/=  2 cm not described below (TR1): 0 Number of mixed cystic and solid nodules >/= 1.5 cm not described below ([place]): 0 _________________________________________________________ Nodule # 1: Location: Left; inferior Maximum size: 2.8 cm; Other 2 dimensions: 1.8 x 1.6 cm Composition: solid/almost completely solid (2) Echogenicity: hypoechoic (2) Shape: not taller-than-wide (0) Margins: smooth (0) Echogenic foci: none (0) ACR TI-RADS total points: 4. ACR TI-RADS risk category: TR4 (4-6 points). ACR TI-RADS recommendations: **Given size (>/= 1.5 cm) and appearance, fine needle aspiration of this moderately suspicious nodule should be considered based on TI-RADS criteria. This nodule corresponds to the left thyroid abnormality seen on recent MRI. No discrete abnormality identified in the right thyroid lobe. _________________________________________________________  IMPRESSION: Nodule 1 (TI-RADS 4) located in the inferior left thyroid lobe measuring 2.8 x 1.8 x 1.6 cm, meets criteria for FNA. The above is in keeping with  the ACR TI-RADS recommendations - J Am Coll Radiol 2017;14:587-595. Electronically Signed   By: Miachel Roux M.D.   On: 04/10/2022 15:45   ECHOCARDIOGRAM COMPLETE BUBBLE STUDY  Result Date: 04/10/2022    ECHOCARDIOGRAM REPORT   Patient Name:   Connie Herrera Date of Exam: 04/10/2022 Medical Rec #:  [id number]            Height:       66.0 in Accession #:    [id number]           Weight:       235.0 lb Date of Birth:  [birth year]           BSA:          2.142 m Patient Age:    78 years             BP:           147/88 mmHg Patient Gender: F                    HR:           84 bpm. Exam Location:  Inpatient Procedure: 2D Echo, Cardiac Doppler, Color Doppler and [place] Contrast Bubble            Study Indications:    Stroke  History:        Patient has no prior history of Echocardiogram examinations. No                 prior cardiac history.  Sonographer:    Roseanna Rainbow RDCS Referring Phys: [id number] [place]  Sonographer Comments: Suboptimal parasternal window. Patient was very sensitive to pressure from probe IMPRESSIONS  1. Left ventricular ejection fraction, by estimation, is 55 to 60%. The left ventricle has normal function. The left ventricle has no regional wall motion abnormalities. There is mild concentric left ventricular hypertrophy. Left ventricular diastolic parameters are consistent with Grade I diastolic dysfunction (impaired relaxation).  2. Right ventricular systolic function is normal. The right ventricular size is normal. Tricuspid regurgitation signal is inadequate for assessing PA pressure.  3. No evidence of mitral valve regurgitation.  4. The aortic valve was not well visualized. Aortic valve regurgitation is not visualized.  5. The inferior vena cava is normal in size with greater than 50% respiratory variability, suggesting right  atrial pressure of 3 mmHg. Comparison(s): No prior Echocardiogram. FINDINGS  Left Ventricle: Left ventricular ejection fraction, by estimation, is 55 to 60%. The left ventricle has normal function. The left ventricle has no regional wall motion abnormalities. The left ventricular internal cavity size was normal in size. There is  mild concentric left ventricular hypertrophy. Left ventricular diastolic parameters are consistent with Grade I diastolic dysfunction (impaired relaxation). Right Ventricle: The right ventricular size is normal. Right ventricular systolic function is normal. Tricuspid regurgitation signal is inadequate for assessing PA pressure. Left Atrium: Left atrial size was normal in size. Right Atrium: Right atrial size was normal in size. Pericardium: There is no evidence of pericardial effusion. Mitral Valve: No evidence of mitral valve regurgitation. Tricuspid Valve: Tricuspid valve regurgitation is not demonstrated. Aortic Valve: The aortic valve was not well visualized. Aortic valve regurgitation is not visualized. Pulmonic Valve: Pulmonic valve regurgitation is not visualized. Aorta: The aortic root and ascending aorta are structurally normal, with no evidence of dilitation. Venous: The inferior vena cava is normal in size with greater than 50% respiratory variability, suggesting right atrial pressure of 3 mmHg.  IAS/Shunts: No atrial level shunt detected by color flow Doppler. Agitated saline contrast was given intravenously to evaluate for intracardiac shunting.  LEFT VENTRICLE PLAX 2D LVIDd:         5.00 cm      Diastology LVIDs:         3.50 cm      LV e' medial:    6.09 cm/s LV PW:         1.30 cm      LV E/e' medial:  11.4 LV IVS:        1.10 cm      LV e' lateral:   9.79 cm/s LVOT diam:     2.20 cm      LV E/e' lateral: 7.1 LV SV:         68 LV SV Index:   32 LVOT Area:     3.80 cm  LV Volumes (MOD) LV vol d, MOD A2C: 64.7 ml LV vol d, MOD A4C: 114.0 ml LV vol s, MOD A2C: 30.1 ml LV vol  s, MOD A4C: 44.8 ml LV SV MOD A2C:     34.6 ml LV SV MOD A4C:     114.0 ml LV SV MOD BP:      53.0 ml RIGHT VENTRICLE             IVC RV S prime:     12.00 cm/s  IVC diam: 1.00 cm TAPSE (M-mode): 2.0 cm LEFT ATRIUM             Index        RIGHT ATRIUM           Index LA diam:        2.40 cm 1.12 cm/m   RA Area:     12.50 cm LA Vol (A2C):   40.1 ml 18.72 ml/m  RA Volume:   29.20 ml  13.63 ml/m LA Vol (A4C):   39.9 ml 18.63 ml/m LA Biplane Vol: 41.2 ml 19.24 ml/m  AORTIC VALVE LVOT Vmax:   108.00 cm/s LVOT Vmean:  69.400 cm/s LVOT VTI:    0.178 m  AORTA Ao Root diam: 3.40 cm Ao Asc diam:  3.70 cm MITRAL VALVE MV Area (PHT): 3.88 cm    SHUNTS MV Decel Time: 196 msec    Systemic VTI:  0.18 m MV E velocity: 69.40 cm/s  Systemic Diam: 2.20 cm MV A velocity: 82.00 cm/s MV E/A ratio:  0.85 Landscape architect signed by Phineas Inches Signature Date/Time: 04/10/2022/9:27:28 AM    Final    CT ANGIO HEAD NECK W WO CM  Result Date: 04/09/2022 CLINICAL DATA:  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 Multiplanar CT image reconstructions and MIPs were obtained to evaluate the vascular anatomy. Carotid stenosis measurements (when applicable) are obtained utilizing NASCET criteria, using the distal internal carotid diameter as the denominator. RADIATION DOSE REDUCTION: This exam was performed according to the departmental dose-optimization program which includes automated exposure control, adjustment of the mA and/or kV according to patient size and/or use of iterative reconstruction technique. CONTRAST:  56m OMNIPAQUE IOHEXOL 350 MG/ML SOLN COMPARISON:  CT head 10/22/2021, MRI head 04/09/2022; no prior CTA FINDINGS: CT HEAD FINDINGS Brain: No evidence of acute  infarct, hemorrhage, mass, mass effect, or midline  shift. No hydrocephalus or extra-axial fluid collection. In the possible infarcts noted on the same-day MRI are not definitively seen on this exam. Vascular: No hyperdense vessel. Skull: Normal. Negative for fracture or focal lesion. Sinuses/Orbits: No acute finding. Other: The mastoid air cells are well aerated. 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 Standard aortic branching. Right carotid system: No evidence of stenosis, dissection, or occlusion. Left carotid system: No evidence of stenosis, dissection, or occlusion. Vertebral arteries: No evidence of stenosis, dissection, or occlusion. Skeleton: No acute osseous abnormality. Other neck: The thyroid nodules noted on the same-day MRI are not as discrete on CT, possibly due to photon starvation in this area due to overlapping soft tissues. No additional acute finding in the neck. Upper chest: No focal pulmonary opacity or pleural effusion. Review of the MIP images confirms the above findings CTA HEAD FINDINGS Anterior circulation: Both internal carotid arteries are patent to the termini, without significant stenosis. A1 segments patent. Normal anterior communicating artery. Anterior cerebral arteries are patent to their distal aspects. No M1 stenosis or occlusion. MCA branches perfused and symmetric. Posterior circulation: Vertebral arteries patent to the vertebrobasilar junction without stenosis. Posterior inferior cerebellar arteries patent proximally. Basilar patent to its distal aspect. Superior cerebellar arteries patent proximally. Patent P1 segments. PCAs perfused to their distal aspects without stenosis. The bilateral posterior communicating arteries are patent. Venous sinuses: As permitted by contrast timing, patent. Anatomic variants: None significant. Review of the MIP  images confirms the above findings IMPRESSION: 1. 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 2. No acute intracranial process. The infarcts seen on the 04/09/2022 MRI are not apparent on CT. 3. No intracranial large vessel occlusion or significant stenosis. 4. No hemodynamically significant stenosis in the neck. 5. The thyroid nodules noted on the 04/09/2022 MRI of the cervical spine are not apparent on this study. Further evaluation with nonemergent ultrasound is recommended. (Reference: J Am Coll Radiol. 2015 Feb;12(2): 143-50) These results will be called to the ordering clinician or representative by the Radiologist Assistant, and communication documented in the PACS or [employer]. Electronically Signed   By: Merilyn Baba M.D.   On: 04/09/2022 23:27   DG FL GUIDED LUMBAR PUNCTURE  Result Date: 04/09/2022 CLINICAL DATA:  47 year old female with sudden onset of weakness of upper extremity, MR brain showed possible demyelinating disease. Request for diagnostic fluoro guided lumbar puncture. EXAM: DIAGNOSTIC LUMBAR PUNCTURE UNDER FLUOROSCOPIC GUIDANCE COMPARISON:  MR lumbar dated 10/03/2020, fluoro guided lumbar puncture dated 05/05/2021. FLUOROSCOPY: Radiation Exposure Index (as provided by the fluoroscopic device): 58 mGy Kerma PROCEDURE: Informed consent was obtained from the patient prior to the procedure, including risks and potential complications of bleeding, infection, damage stages in structures, low yield, headache, allergy, and pain. With the patient prone, the lower back was prepped with Betadine. 1% Lidocaine was used for local anesthesia. Two lumbar puncture was attempted at the L3 level using a 3 inches 20 gauge gauge needle, unsuccessful. Dr. Leonia Reeves was called to the procedure room for assistance. The spinal needle was repositioned to slightly lateral at the same level with return of clear CSF. Opening pressure was not measured. 9 ml of CSF  were obtained for laboratory studies. The spinal needle was removed, dressing was placed. Post procedure orders placed. The patient tolerated the procedure well and there were no apparent complications. IMPRESSION: Technically successful diagnostic fluoro guided  lumbar puncture. This procedure was performed by Durenda Guthrie, PA-C under supervision of Suzy Bouchard, MD. Electronically Signed   By: Suzy Bouchard M.D.   On: 04/09/2022 18:18   MR THORACIC SPINE W WO CONTRAST  Result Date: 04/09/2022 CLINICAL DATA:  Bilateral hand pain and numbness. Rule out spontaneous CSF leak/intracranial hypotension EXAM: MRI THORACIC WITHOUT AND WITH CONTRAST TECHNIQUE: Multiplanar and multiecho pulse sequences of the thoracic spine were obtained without and with intravenous contrast. CONTRAST:  47m GADAVIST GADOBUTROL 1 MMOL/ML IV SOLN COMPARISON:  None Available. FINDINGS: Alignment:  Normal alignment.  Mild scoliosis Vertebrae: Negative for fracture or mass. No evidence of spinal infection in the thoracic spine Cord: Normal signal and morphology. Normal enhancement of the cord. Paraspinal and other soft tissues: Negative for paraspinous mass or fluid collection. Thyroid nodules are described in the cervical spine report. Disc levels: Small central disc protrusion T6-7 and T7-8 without spinal stenosis. Negative for thoracic spinal stenosis. IMPRESSION: Mild thoracic degenerative change. No evidence of spinal infection or spinal stenosis in the thoracic spine. Electronically Signed   By: CFranchot GalloM.D.   On: 04/09/2022 13:46   MR CERVICAL SPINE W WO CONTRAST  Result Date: 04/09/2022 CLINICAL DATA:  Bilateral hand pain and numbness. Rule out intracranial hypotension/CSF leak. EXAM: MRI CERVICAL SPINE WITHOUT AND WITH CONTRAST TECHNIQUE: Multiplanar and multiecho pulse sequences of the cervical spine, to include the craniocervical junction and cervicothoracic junction, were obtained without and with intravenous contrast.  CONTRAST:  174mGADAVIST GADOBUTROL 1 MMOL/ML IV SOLN COMPARISON:  MRI cervical spine 08/13/2020 FINDINGS: Alignment: Normal Vertebrae: Negative for fracture or mass. No evidence of spinal infection. Cord: Normal signal and morphology.  Normal enhancement of the cord. Posterior Fossa, vertebral arteries, paraspinal tissues: Right thyroid nodule approximately 2.5 cm. Left thyroid nodule 1.6 cm. No enlarged lymph nodes in the neck. Disc levels: C2-3: Negative C3-4: Mild disc degeneration without significant stenosis C4-5: Mild disc degeneration and spurring. Bulging of the disc without significant stenosis C5-6: Mild disc degeneration.  Negative for stenosis C6-7: Mild disc degeneration and disc bulging without stenosis C7-T1: Negative Image quality degraded by motion. IMPRESSION: 1. Mild degenerative change in the cervical spine. No significant stenosis. No evidence of spinal infection. 2. 2.5 cm right thyroid nodule. Left thyroid nodule 1.6 cm. (ref: J Am Coll Radiol. 2015 Feb;12(2): 143-50). Recommend thyroid ultrasound for further evaluation. Electronically Signed   By: ChFranchot Gallo.D.   On: 04/09/2022 13:42   MR BRAIN W WO CONTRAST  Result Date: 04/09/2022 CLINICAL DATA:  Bilateral hand pain and numbness. Rule out demyelinating disease or stroke. EXAM: MRI HEAD WITHOUT AND WITH CONTRAST TECHNIQUE: Multiplanar, multiecho pulse sequences of the brain and surrounding structures were obtained without and with intravenous contrast. CONTRAST:  1067mADAVIST GADOBUTROL 1 MMOL/ML IV SOLN COMPARISON:  CT head 10/22/2021.  MRI head 07/11/2020 FINDINGS: Brain: Multiple small areas of diffusion hyperintense signal in the occipital parietal lobes bilaterally. Some of these lesions show mild enhancement in the parietal lobe bilaterally. Some of the lesions do not show abnormal enhancement. In addition, there are scattered white matter hyperintensities bilaterally which appear mild. Brainstem normal. Chronic hemorrhage  in the left lateral basal ganglia with mild peripheral enhancement unchanged from the prior MRI. 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 Enhancement is somewhat atypical however is stable no other areas of hemorrhage Ventricle size normal. Left a meningeal enhancement normal. Pituitary normal Vascular: Normal arterial flow voids Skull and upper  cervical spine: No focal skeletal lesion. Sinuses/Orbits: Paranasal sinuses clear.  Negative orbit Other: None IMPRESSION: 1. Multiple small areas of diffusion hyperintense signal in the occipital parietal lobes bilaterally. Some of these lesions do mild enhancement. Appearance is nonspecific but could be seen with subacute infarct. Demyelinating disease a consideration but not typical. Also consider inflammatory process such as sarcoid, vasculitis, lupus and PRES. Recommend lumbar puncture for further evaluation. 2. 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 Enhancement is somewhat atypical however is stable. No other areas of hemorrhage. 3. Mild white matter changes in the cerebral hemispheres bilaterally. This may be due to chronic microvascular ischemia. Demyelinating disease not excluded. Correlate with risk factors for small vessel disease. Electronically Signed   By: Franchot Gallo M.D.   On: 04/09/2022 13:38    Labs: BNP (last 3 results) No results for input(s): "BNP" in the last 8760 hours. Basic Metabolic Panel: Recent Labs  Lab 04/11/22 0828 04/13/22 0258 04/14/22 0545 04/15/22 0215 04/16/22 0056 04/17/22 0146  NA 135 134* 133* 132* 135 134*  K 3.4* 3.4* 4.1 3.7 3.8 3.6  CL 103 102 103 99 101 102  CO2 19* 21* 21* '23 24 24  '$ GLUCOSE 109* 97 122* 125* 105* 113*  BUN '7 9 10 8 9 11  '$ CREATININE 0.78 0.76 0.84 0.76 0.92 0.70  CALCIUM 8.5* 8.8* 8.9  8.5* 8.5* 8.7*  MG 1.8 1.8  --  2.0 2.0  --    Liver Function Tests: No results for input(s): "AST", "ALT", "ALKPHOS", "BILITOT", "PROT", "ALBUMIN" in the last 168 hours. No results for input(s): "LIPASE", "AMYLASE" in the last 168 hours. No results for input(s): "AMMONIA" in the last 168 hours. CBC: Recent Labs  Lab 04/12/22 1202 04/13/22 0258 04/14/22 0545 04/15/22 0215 04/16/22 0056 04/17/22 0146  WBC 7.5 7.6 7.2 9.7 7.8 7.9  NEUTROABS 5.1  --   --   --   --   --   HGB 10.6* 10.1* 11.2* 8.9* 8.8* 8.9*  HCT 34.1* 34.1* 37.3 28.4* 29.6* 28.4*  MCV 72.6* 74.1* 73.7* 72.3* 74.7* 73.0*  PLT 410* 410* 493* 363 361 335   Cardiac Enzymes: No results for input(s): "CKTOTAL", "CKMB", "CKMBINDEX", "TROPONINI" in the last 168 hours. BNP: Invalid input(s): "POCBNP" CBG: No results for input(s): "GLUCAP" in the last 168 hours. D-Dimer No results for input(s): "DDIMER" in the last 72 hours. Hgb A1c No results for input(s): "HGBA1C" in the last 72 hours. Lipid Profile No results for input(s): "CHOL", "HDL", "LDLCALC", "TRIG", "CHOLHDL", "LDLDIRECT" in the last 72 hours. Thyroid function studies No results for input(s): "TSH", "T4TOTAL", "T3FREE", "THYROIDAB" in the last 72 hours.  Invalid input(s): "FREET3" Anemia work up No results for input(s): "VITAMINB12", "FOLATE", "FERRITIN", "TIBC", "IRON", "RETICCTPCT" in the last 72 hours. Urinalysis    Component Value Date/Time   COLORURINE YELLOW 04/09/2022 2021   APPEARANCEUR CLOUDY (A) 04/09/2022 2021   LABSPEC 1.015 04/09/2022 2021   PHURINE 7.0 04/09/2022 2021   GLUCOSEU NEGATIVE 04/09/2022 2021   HGBUR NEGATIVE 04/09/2022 [place] NEGATIVE 04/09/2022 2021   KETONESUR NEGATIVE 04/09/2022 2021   PROTEINUR NEGATIVE 04/09/2022 2021   UROBILINOGEN 0.2 01/03/2022 1616   NITRITE NEGATIVE 04/09/2022 2021   LEUKOCYTESUR NEGATIVE 04/09/2022 2021   Sepsis Labs Recent Labs  Lab 04/14/22 0545 04/15/22 0215 04/16/22 0056  04/17/22 0146  WBC 7.2 9.7 7.8 7.9   Microbiology Recent Results (from the past 240 hour(s))  CSF culture w Gram Stain     Status: None   Collection Time: 04/09/22  5:08 PM   Specimen: PATH Cytology CSF; Cerebrospinal Fluid  Result Value Ref Range Status   Specimen Description CSF  Final   Special Requests NONE  Final   Gram Stain NO WBC SEEN NO ORGANISMS SEEN CYTOSPIN SMEAR   Final   Culture   Final    NO GROWTH Performed at [facility], [address] [place], [place] [address]    Report Status 04/12/2022 FINAL  Final  Culture, fungus without smear     Status: None (Preliminary result)   Collection Time: 04/09/22  5:08 PM   Specimen: PATH Cytology CSF; Cerebrospinal Fluid  Result Value Ref Range Status   Specimen Description CSF  Final   Special Requests NONE  Final   Culture   Final    NO FUNGUS ISOLATED AFTER 8 DAYS Performed at [facility], [place] [address] [place], [place] [address]    Report Status PENDING  Incomplete  Surgical pcr screen     Status: None   Collection Time: 04/14/22  6:20 AM   Specimen: Nasal Mucosa; Nasal Swab  Result Value Ref Range Status   MRSA, PCR NEGATIVE NEGATIVE Final   Staphylococcus aureus NEGATIVE NEGATIVE Final    Comment: (NOTE) The Xpert SA Assay (FDA approved for NASAL specimens in patients 42 years of age and older), is one component of a comprehensive surveillance program. It is not intended to diagnose infection nor to guide or monitor treatment. Performed at [facility], [place] [address], [place], [place] [address]      Time coordinating discharge: 35 minutes  SIGNED: Antonieta Pert, MD  [facility] 04/17/2022, 10:42 AM  If 7PM-7AM, please contact night-coverage [email or web address]

## 2022-04-20 SURGERY — ECHOCARDIOGRAM, TRANSESOPHAGEAL
Anesthesia: Monitor Anesthesia Care

## 2022-04-21 ENCOUNTER — Encounter (HOSPITAL_BASED_OUTPATIENT_CLINIC_OR_DEPARTMENT_OTHER): Payer: Self-pay | Admitting: Emergency Medicine

## 2022-04-21 ENCOUNTER — Ambulatory Visit (HOSPITAL_COMMUNITY): Payer: BLUE CROSS/BLUE SHIELD

## 2022-04-21 ENCOUNTER — Other Ambulatory Visit: Payer: Self-pay

## 2022-04-21 ENCOUNTER — Encounter (HOSPITAL_COMMUNITY): Payer: Self-pay

## 2022-04-21 ENCOUNTER — Emergency Department (HOSPITAL_BASED_OUTPATIENT_CLINIC_OR_DEPARTMENT_OTHER): Payer: BLUE CROSS/BLUE SHIELD

## 2022-04-21 ENCOUNTER — Ambulatory Visit: Payer: Self-pay

## 2022-04-21 ENCOUNTER — Emergency Department (HOSPITAL_COMMUNITY)
Admission: EM | Admit: 2022-04-21 | Discharge: 2022-04-21 | Disposition: A | Discharge status: Home / Self Care | Payer: BLUE CROSS/BLUE SHIELD | Attending: Emergency Medicine | Admitting: Emergency Medicine

## 2022-04-21 DIAGNOSIS — M79602 Pain in left arm: Secondary | ICD-10-CM | POA: Insufficient documentation

## 2022-04-21 DIAGNOSIS — R609 Edema, unspecified: Secondary | ICD-10-CM

## 2022-04-21 DIAGNOSIS — Z86718 Personal history of other venous thrombosis and embolism: Secondary | ICD-10-CM | POA: Diagnosis not present

## 2022-04-21 DIAGNOSIS — Z7901 Long term (current) use of anticoagulants: Secondary | ICD-10-CM | POA: Diagnosis not present

## 2022-04-21 DIAGNOSIS — Z7982 Long term (current) use of aspirin: Secondary | ICD-10-CM | POA: Insufficient documentation

## 2022-04-21 DIAGNOSIS — Z8673 Personal history of transient ischemic attack (TIA), and cerebral infarction without residual deficits: Secondary | ICD-10-CM | POA: Diagnosis not present

## 2022-04-21 DIAGNOSIS — Z79899 Other long term (current) drug therapy: Secondary | ICD-10-CM | POA: Insufficient documentation

## 2022-04-21 DIAGNOSIS — K0889 Other specified disorders of teeth and supporting structures: Secondary | ICD-10-CM | POA: Insufficient documentation

## 2022-04-21 LAB — BASIC METABOLIC PANEL
Anion gap: 9 (ref 5–15)
BUN: 7 mg/dL (ref 6–20)
CO2: 23 mmol/L (ref 22–32)
Calcium: 8.9 mg/dL (ref 8.9–10.3)
Chloride: 104 mmol/L (ref 98–111)
Creatinine, Ser: 0.73 mg/dL (ref 0.44–1.00)
GFR, Estimated: >60 mL/min (ref >60)
Glucose, Bld: 88 mg/dL (ref 70–99)
Potassium: 3.8 mmol/L (ref 3.5–5.1)
Sodium: 136 mmol/L (ref 135–145)

## 2022-04-21 LAB — CBC WITH DIFFERENTIAL/PLATELET
Abs Immature Granulocytes: 0.02 10*3/uL (ref 0.00–0.07)
Basophils Absolute: 0 10*3/uL (ref 0.0–0.1)
Basophils Relative: 1 %
Eosinophils Absolute: 0.1 10*3/uL (ref 0.0–0.5)
Eosinophils Relative: 1 %
HCT: 32.5 % — ABNORMAL LOW (ref 36.0–46.0)
Hemoglobin: 9.8 g/dL — ABNORMAL LOW (ref 12.0–15.0)
Immature Granulocytes: 0 %
Lymphocytes Relative: 22 %
Lymphs Abs: 1.5 10*3/uL (ref 0.7–4.0)
MCH: 22.7 pg — ABNORMAL LOW (ref 26.0–34.0)
MCHC: 30.2 g/dL (ref 30.0–36.0)
MCV: 75.4 fL — ABNORMAL LOW (ref 80.0–100.0)
Monocytes Absolute: 0.5 10*3/uL (ref 0.1–1.0)
Monocytes Relative: 7 %
Neutro Abs: 4.5 10*3/uL (ref 1.7–7.7)
Neutrophils Relative %: 69 %
Platelets: 477 10*3/uL — ABNORMAL HIGH (ref 150–400)
RBC: 4.31 MIL/uL (ref 3.87–5.11)
RDW: 17.5 % — ABNORMAL HIGH (ref 11.5–15.5)
WBC: 6.6 10*3/uL (ref 4.0–10.5)
nRBC: 0 % (ref 0.0–0.2)

## 2022-04-21 NOTE — ED Provider Notes (Signed)
[facility] EMERGENCY DEPARTMENT Provider Note   CSN: [id number] Arrival date & time: 04/21/22  1114     History  Chief Complaint  Patient presents with   Lt Arm Pain    Connie Herrera is a 47 y.o. 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  She reports she had no pain when discharged from hospital, but last night began having pain in her entire left arm.  No numbness or weakness.  She has not noted swelling.  HPI     Home Medications Prior to Admission medications   Medication Sig Start Date End Date Taking? Authorizing Provider  APIXABAN Arne Cleveland) VTE STARTER PACK ('10MG'$  AND '5MG'$ ) Take as directed on package: start with two-'5mg'$  tablets twice daily until 04/22/2021. On 04/23/22 switch to one-'5mg'$  tablet twice daily. 04/17/22   Antonieta Pert, MD  aspirin EC 81 MG tablet Take 1 tablet (81 mg total) by mouth daily at 6 (six) AM. Swallow whole. 04/17/22 05/17/22  Antonieta Pert, MD  baclofen (LIORESAL) 10 MG tablet Take 1 tablet (10 mg total) by mouth 3 (three) times daily. Patient not taking: Reported on 04/09/2022 02/25/22   Pattricia Boss, MD  docusate sodium (COLACE) 100 MG capsule Take 1 capsule (100 mg total) by mouth daily. 04/17/22   Antonieta Pert, MD  ferrous sulfate 325 (65 FE) MG EC tablet Take 1 tablet (325 mg total) by mouth 2 (two) times daily. Patient taking differently: Take 1 tablet by mouth daily with breakfast. 01/08/22   Benay Pike, MD  ondansetron (ZOFRAN) 4 MG tablet Take 1 tablet (4 mg total) by mouth every 8 (eight) hours as needed for nausea or vomiting. 04/17/22   Antonieta Pert, MD  pantoprazole (PROTONIX) 40 MG tablet Take 1 tablet (40 mg total) by mouth daily. 04/17/22 05/17/22  Antonieta Pert, MD  prochlorperazine (COMPAZINE) 10 MG tablet Take 1 tablet (10 mg total) by mouth every 8 (eight) hours as needed  (nausea or headache). Patient not taking: Reported on 04/09/2022 10/23/21   Maudie Flakes, MD  rosuvastatin (CRESTOR) 10 MG tablet Take 1 tablet (10 mg total) by mouth daily. 04/17/22 05/17/22  Antonieta Pert, MD  senna-docusate (SENOKOT-S) 8.6-50 MG tablet Take 2 tablets by mouth 2 (two) times daily for 14 days. 04/17/22 05/01/22  Antonieta Pert, MD  traMADol (ULTRAM) 50 MG tablet Take 1 tablet (50 mg total) by mouth every 6 (six) hours as needed for up to 12 doses. 04/17/22   Antonieta Pert, MD  hydrochlorothiazide (HYDRODIURIL) 25 MG tablet Take 1 tablet (25 mg total) by mouth daily. Must have office visit for refills 06/20/20 08/13/20  Brunetta Jeans, PA-C      Allergies    Patient has no known allergies.    Review of Systems   Review of Systems  Physical Exam Updated Vital Signs BP (!) 153/96   Pulse 83   Temp 98.4 F (36.9 C) (Oral)   Resp 16   LMP 03/25/2022 Comment: Neg Preg Test today on 04/10/22.  SpO2 100%  Physical Exam Constitutional:      General: She is not in acute distress. HENT:     Head: Normocephalic and atraumatic.  Eyes:     Conjunctiva/sclera: Conjunctivae normal.     Pupils: Pupils are equal, round, and reactive to light.  Cardiovascular:     Rate and Rhythm: Normal rate  and regular rhythm.     Pulses: Normal pulses.  Pulmonary:     Effort: Pulmonary effort is normal. No respiratory distress.  Abdominal:     General: There is no distension.     Tenderness: There is no abdominal tenderness.  Musculoskeletal:        General: No swelling.  Skin:    General: Skin is warm and dry.  Neurological:     General: No focal deficit present.     Mental Status: She is alert. Mental status is at baseline.  Psychiatric:        Mood and Affect: Mood normal.        Behavior: Behavior normal.     ED Results / Procedures / Treatments   Labs (all labs ordered are listed, but only abnormal results are displayed) Labs Reviewed  CBC WITH DIFFERENTIAL/PLATELET - Abnormal; Notable  for the following components:      Result Value   Hemoglobin 9.8 (*)    HCT 32.5 (*)    MCV 75.4 (*)    MCH 22.7 (*)    RDW 17.5 (*)    Platelets 477 (*)    All other components within normal limits  BASIC METABOLIC PANEL    EKG None  Radiology UE VENOUS DUPLEX (7am - 7pm)  Result Date: 04/21/2022 UPPER VENOUS STUDY  Patient Name:  Connie Herrera  Date of Exam:   04/21/2022 Medical Rec #: [id number]             Accession #:    [id number] Date of Birth: [birth year]            Patient Gender: F Patient Age:   35 years Exam Location:  [facility] Procedure:      VAS [place] UPPER EXTREMITY VENOUS DUPLEX Referring Phys: MADISON REDWINE --------------------------------------------------------------------------------  Indications: Edema Comparison Study: no prior Performing Technologist: Archie Patten RVS  Examination Guidelines: A complete evaluation includes B-mode imaging, spectral Doppler, color Doppler, and power Doppler as needed of all accessible portions of each vessel. Bilateral testing is considered an integral part of a complete examination. Limited examinations for reoccurring indications may be performed as noted.  Right Findings: +----------+------------+---------+-----------+----------+-------+ RIGHT     CompressiblePhasicitySpontaneousPropertiesSummary +----------+------------+---------+-----------+----------+-------+ Subclavian               Yes       Yes                      +----------+------------+---------+-----------+----------+-------+  Left Findings: +----------+------------+---------+-----------+----------+-------+ LEFT      CompressiblePhasicitySpontaneousPropertiesSummary +----------+------------+---------+-----------+----------+-------+ IJV           Full       Yes       Yes                      +----------+------------+---------+-----------+----------+-------+ Subclavian    Full       Yes       Yes                       +----------+------------+---------+-----------+----------+-------+ Axillary      Full       Yes       Yes                      +----------+------------+---------+-----------+----------+-------+ Brachial      Full       Yes       Yes                      +----------+------------+---------+-----------+----------+-------+  Radial        Full                                          +----------+------------+---------+-----------+----------+-------+ Ulnar         Full                                          +----------+------------+---------+-----------+----------+-------+ Cephalic      Full                                          +----------+------------+---------+-----------+----------+-------+ Basilic       Full                                          +----------+------------+---------+-----------+----------+-------+  Summary:  Right: No evidence of thrombosis in the subclavian.  Left: No evidence of deep vein thrombosis in the upper extremity. No evidence of superficial vein thrombosis in the upper extremity.  *See table(s) above for measurements and observations.    Preliminary     Procedures Procedures    Medications Ordered in ED Medications - No data to display  ED Course/ Medical Decision Making/ A&P Clinical Course as of 04/21/22 2304  Tue Apr 21, 2022  1739 I spoke to Dr Quinn Axe from neurology regarding the likelihood that there may be a lesion of the brain or spinal cord that would correlate to this new onset of pain in her left arm, and the neurologist does not think this would be the case.  Patient was conversation I do not see an indication for emergent repeat imaging of the head or cervical spine.  I am waiting to hear from vascular surgery regarding any recommendations for further vascular imaging while the patient is here. [MT]  1696 I repaged vascular surgeon via office [MT]  1845 I spoke to Dr Carlis Abbott from vascular surgery who has no further  recommendations at this time, feels the patient can follow-up as an outpatient.  Patient is content with this plan and wanting to go home. [MT]    Clinical Course User Index [MT] Regis Hinton, Carola Rhine, MD                             Medical Decision Making  This patient presents to the ED with concern for left arm pain. This involves an extensive number of treatment options, and is a complaint that carries with it a high risk of complications and morbidity.  The differential diagnosis includes vascular problem vs occlusion vs DVT vs demyelinating lesion vs other  Co-morbidities that complicate the patient evaluation: hx of thrombosis  External records from outside source obtained and reviewed including hospital discharge summary Jan 2024, Vascular surgery evaluation notes Dr Dorise Bullion  Bubble study negative for PFO during stroke workup last week  I ordered and personally interpreted labs.  The pertinent results include: No significant findings  I ordered imaging studies including vascular venous ultrasound left upper extremity I independently visualized and interpreted imaging which showed no DVT.  Per  my discussion with ultrasound technician there was good triphasic arterial flow as well, no evidence of arterial occlusion. I agree with the radiologist interpretation   Test Considered: This point have a low suspicion for CNS lesion or acute arterial thrombosis, per my discussion with neurology as well as vascular surgery, I do not see an indication for further imaging   After the interventions noted above, I reevaluated the patient and found that they have: improved  Patient appears comfortable in the room.  Pain is not significant.  Dispostion:  After consideration of the diagnostic results and the patients response to treatment, I feel that the patent would benefit from close outpatient follow-up.  She has a PCP appointment tomorrow.  She will see neurology and vascular as an  outpatient.  I do not see evidence of infection, ischemia, or CNS lesion at this time, and I think she is reasonably stable for discharge.         Final Clinical Impression(s) / ED Diagnoses Final diagnoses:  Left arm pain    Rx / DC Orders ED Discharge Orders     None         Wyvonnia Dusky, MD 04/21/22 2304

## 2022-04-21 NOTE — Progress Notes (Signed)
Attempted to send for patient to vascular. Per Exie Parody with transport, unable to locate patient in waiting area. Will notify waiting room to please page vascular if patient is located.   Darlin Coco, RDMS, RVT

## 2022-04-21 NOTE — Telephone Encounter (Signed)
HFU scheduled for 04/22/22.the patient states that she was currently going into ER at the time of call

## 2022-04-21 NOTE — ED Triage Notes (Addendum)
Left arm pain. Reports left arm pain started this morning. [facility] and advised to f/u out patient.  Recent hospitalization Mild swelling to left lower arm

## 2022-04-21 NOTE — Discharge Instructions (Addendum)
Please follow-up with your doctor tomorrow.  Your ultrasound did not show signs of blood clots in your arm today.  If you have persistent pain and swelling in your arm, should discuss this with your doctors office.  You may need a repeat ultrasound performed in several days.

## 2022-04-21 NOTE — ED Notes (Signed)
Pt ambulatory to waiting room. Pt verbalized understanding of discharge instructions.   

## 2022-04-21 NOTE — Telephone Encounter (Signed)
     Chief Complaint: Left arm pain. "I was just in the hospital with a stroke." Concerned about symptoms and having another stroke. Asking for hospital follow up soon. Symptoms: Pain Frequency: 2 days ago Pertinent Negatives: Patient denies weakness. Disposition: '[x]'$ ED /'[]'$ Urgent Care (no appt availability in office) / '[]'$ Appointment(In office/virtual)/ '[]'$  [facility]/ '[]'$ Home Care/ '[]'$ Refused Recommended Disposition /'[]'$ [facility]/ '[]'$  Follow-up with PCP Additional Notes: Pt. Home alone, will call 911.  Reason for Disposition  Sounds like a life-threatening emergency to the triager  Answer Assessment - Initial Assessment Questions 1. ONSET: "When did the pain start?"     2 days 2. LOCATION: "Where is the pain located?"     Arm 3. PAIN: "How bad is the pain?" (Scale 1-10; or mild, moderate, severe)   - MILD (1-3): Doesn't interfere with normal activities.   - MODERATE (4-7): Interferes with normal activities (e.g., work or school) or awakens from sleep.   - SEVERE (8-10): Excruciating pain, unable to do any normal activities, unable to hold a cup of water.     8 4. WORK OR EXERCISE: "Has there been any recent work or exercise that involved this part of the body?"     No 5. CAUSE: "What do you think is causing the arm pain?"     Unsure 6. OTHER SYMPTOMS: "Do you have any other symptoms?" (e.g., neck pain, swelling, rash, fever, numbness, weakness)     No 7. PREGNANCY: "Is there any chance you are pregnant?" "When was your last menstrual period?"     No  Protocols used: Arm Pain-A-AH

## 2022-04-21 NOTE — ED Provider Triage Note (Signed)
Emergency Medicine Provider Triage Evaluation Note  Connie Herrera , a 47 y.o. female  was evaluated in triage.  Pt complains of left arm discomfort.  Recently discharged from the hospital after endarterectomy with brachiocephalic stenting a week ago. Review of Systems  Positive:  Negative:   Physical Exam  BP (!) 148/97 (BP Location: Right Wrist)   Pulse 74   Temp 98.7 F (37.1 C) (Oral)   Resp 17   LMP 03/25/2022 Comment: Neg Preg Test today on 04/10/22.  SpO2 97%  Gen:   Awake, no distress   Resp:  Normal effort  MSK:   Moves extremities without difficulty  Other:  Mild swelling to the left upper extremity.  Tenderness along the left biceps.  Neurovascularly intact bilaterally.  Well-healing surgical site of the right neck.  Medical Decision Making  Medically screening exam initiated at 11:48 AM.  Appropriate orders placed.  MADELINE PHO was informed that the remainder of the evaluation will be completed by another provider, this initial triage assessment does not replace that evaluation, and the importance of remaining in the ED until their evaluation is complete.  Will do DVT of left upper and basic labs   Rhae Hammock, PA-C 04/21/22 1149

## 2022-04-21 NOTE — ED Triage Notes (Signed)
Pt BIB [facility] from home d/t Lt arm pain, she was d/c 3 days ago from here. She does have a stent in the Lt arm. That arm is swollen, read & warm to the touch.134/86, 90 bpm, 99% RA, A/Ox4.

## 2022-04-21 NOTE — Progress Notes (Signed)
Upper extremity venous duplex has been completed.   Preliminary results in CV Proc.   Connie Herrera 04/21/2022 2:41 PM

## 2022-04-22 ENCOUNTER — Emergency Department (HOSPITAL_BASED_OUTPATIENT_CLINIC_OR_DEPARTMENT_OTHER): Payer: BLUE CROSS/BLUE SHIELD

## 2022-04-22 ENCOUNTER — Ambulatory Visit: Payer: Self-pay

## 2022-04-22 ENCOUNTER — Ambulatory Visit: Payer: BLUE CROSS/BLUE SHIELD | Admitting: Family

## 2022-04-22 ENCOUNTER — Telehealth: Payer: Self-pay | Admitting: Family

## 2022-04-22 ENCOUNTER — Emergency Department (HOSPITAL_BASED_OUTPATIENT_CLINIC_OR_DEPARTMENT_OTHER)
Admission: EM | Admit: 2022-04-22 | Discharge: 2022-04-22 | Disposition: A | Discharge status: Home / Self Care | Payer: BLUE CROSS/BLUE SHIELD | Source: Home / Self Care | Attending: Emergency Medicine | Admitting: Emergency Medicine

## 2022-04-22 VITALS — BP 123/86 | HR 88 | Temp 98.0°F | Resp 16 | Wt 223.0 lb

## 2022-04-22 DIAGNOSIS — I639 Cerebral infarction, unspecified: Secondary | ICD-10-CM | POA: Diagnosis not present

## 2022-04-22 DIAGNOSIS — E876 Hypokalemia: Secondary | ICD-10-CM

## 2022-04-22 DIAGNOSIS — D509 Iron deficiency anemia, unspecified: Secondary | ICD-10-CM

## 2022-04-22 DIAGNOSIS — R29898 Other symptoms and signs involving the musculoskeletal system: Secondary | ICD-10-CM

## 2022-04-22 DIAGNOSIS — F4323 Adjustment disorder with mixed anxiety and depressed mood: Secondary | ICD-10-CM

## 2022-04-22 DIAGNOSIS — E871 Hypo-osmolality and hyponatremia: Secondary | ICD-10-CM

## 2022-04-22 DIAGNOSIS — D259 Leiomyoma of uterus, unspecified: Secondary | ICD-10-CM

## 2022-04-22 DIAGNOSIS — I749 Embolism and thrombosis of unspecified artery: Secondary | ICD-10-CM | POA: Diagnosis not present

## 2022-04-22 DIAGNOSIS — M79602 Pain in left arm: Secondary | ICD-10-CM | POA: Diagnosis not present

## 2022-04-22 DIAGNOSIS — N920 Excessive and frequent menstruation with regular cycle: Secondary | ICD-10-CM

## 2022-04-22 DIAGNOSIS — I10 Essential (primary) hypertension: Secondary | ICD-10-CM

## 2022-04-22 DIAGNOSIS — E041 Nontoxic single thyroid nodule: Secondary | ICD-10-CM

## 2022-04-22 DIAGNOSIS — Z09 Encounter for follow-up examination after completed treatment for conditions other than malignant neoplasm: Secondary | ICD-10-CM

## 2022-04-22 DIAGNOSIS — D500 Iron deficiency anemia secondary to blood loss (chronic): Secondary | ICD-10-CM

## 2022-04-22 DIAGNOSIS — R52 Pain, unspecified: Secondary | ICD-10-CM

## 2022-04-22 LAB — CBC WITH DIFFERENTIAL/PLATELET
Abs Immature Granulocytes: 0.03 10*3/uL (ref 0.00–0.07)
Basophils Absolute: 0.1 10*3/uL (ref 0.0–0.1)
Basophils Relative: 1 %
Eosinophils Absolute: 0.1 10*3/uL (ref 0.0–0.5)
Eosinophils Relative: 1 %
HCT: 33.7 % — ABNORMAL LOW (ref 36.0–46.0)
Hemoglobin: 10.3 g/dL — ABNORMAL LOW (ref 12.0–15.0)
Immature Granulocytes: 0 %
Lymphocytes Relative: 21 %
Lymphs Abs: 2.2 10*3/uL (ref 0.7–4.0)
MCH: 22.9 pg — ABNORMAL LOW (ref 26.0–34.0)
MCHC: 30.6 g/dL (ref 30.0–36.0)
MCV: 74.9 fL — ABNORMAL LOW (ref 80.0–100.0)
Monocytes Absolute: 0.9 10*3/uL (ref 0.1–1.0)
Monocytes Relative: 8 %
Neutro Abs: 7.5 10*3/uL (ref 1.7–7.7)
Neutrophils Relative %: 69 %
Platelets: 500 10*3/uL — ABNORMAL HIGH (ref 150–400)
RBC: 4.5 MIL/uL (ref 3.87–5.11)
RDW: 18.3 % — ABNORMAL HIGH (ref 11.5–15.5)
WBC: 10.7 10*3/uL — ABNORMAL HIGH (ref 4.0–10.5)
nRBC: 0 % (ref 0.0–0.2)

## 2022-04-22 LAB — BASIC METABOLIC PANEL
Anion gap: 9 (ref 5–15)
BUN: 12 mg/dL (ref 6–20)
CO2: 24 mmol/L (ref 22–32)
Calcium: 9.3 mg/dL (ref 8.9–10.3)
Chloride: 104 mmol/L (ref 98–111)
Creatinine, Ser: 0.91 mg/dL (ref 0.44–1.00)
GFR, Estimated: >60 mL/min (ref >60)
Glucose, Bld: 104 mg/dL — ABNORMAL HIGH (ref 70–99)
Potassium: 3.6 mmol/L (ref 3.5–5.1)
Sodium: 137 mmol/L (ref 135–145)

## 2022-04-22 LAB — CK: Total CK: 50 U/L (ref 38–234)

## 2022-04-22 LAB — PROTHROMBIN GENE MUTATION

## 2022-04-22 LAB — TROPONIN I (HIGH SENSITIVITY): Troponin I (High Sensitivity): 5 ng/L (ref <18)

## 2022-04-22 MED ORDER — HYDROCODONE-ACETAMINOPHEN 5-325 MG PO TABS
1.0000 | ORAL_TABLET | Freq: Once | ORAL | Status: AC
  Administered 2022-04-22: 1 via ORAL
  Filled 2022-04-22: qty 1

## 2022-04-22 MED ORDER — KETOROLAC TROMETHAMINE 30 MG/ML IJ SOLN
15.0000 mg | Freq: Once | INTRAMUSCULAR | Status: AC
  Administered 2022-04-22: 15 mg via INTRAVENOUS
  Filled 2022-04-22: qty 1

## 2022-04-22 MED ORDER — IOHEXOL 350 MG/ML SOLN
150.0000 mL | Freq: Once | INTRAVENOUS | Status: AC | PRN
  Administered 2022-04-22: 130 mL via INTRAVENOUS

## 2022-04-22 NOTE — ED Provider Notes (Signed)
[facility] EMERGENCY DEPT Provider Note   CSN: [id number] Arrival date & time: 04/21/22  2328     History  Chief Complaint  Patient presents with   Arm Pain    Connie Herrera is a 47 y.o. female.  Patient presents with arm pain for the past 2 days.  Denies any fall or traumatic injury.  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  She is currently on Eliquis.  She states she was not having this pain when she left the hospital.  All of her deficits from her stroke have resolved.  She does not have the left arm pain when she left the hospital at discharge.  She was seen at [facility] yesterday for similar pain and was told she had to follow-up as an outpatient.  She had a Doppler ultrasound that was negative for DVT.  He is taking tramadol for pain without relief. She denies any new weakness, numbness or tingling.  No chest pain or shortness of breath.  No cough, fever, nausea or vomiting.  No headache.  She is also complaining some left lower dental pain.  No numbness or tingling to her face.  No facial droop.  No weakness of the arm.  Denies any neck pain.  No numbness or tingling in the arm but just pain from mid upper arm all the way down.  The history is provided by the patient.  Arm Pain Pertinent negatives include no abdominal pain, no headaches and no shortness of breath.       Home Medications Prior to Admission medications   Medication Sig Start Date End Date Taking? Authorizing Provider  APIXABAN Arne Cleveland) VTE STARTER PACK ('10MG'$  AND '5MG'$ ) Take as directed on package: start with two-'5mg'$  tablets twice daily until 04/22/2021. On 04/23/22 switch to one-'5mg'$  tablet twice daily. 04/17/22   Antonieta Pert, MD  aspirin EC 81 MG tablet Take 1 tablet (81 mg total) by mouth daily at 6 (six) AM. Swallow whole. 04/17/22 05/17/22  Antonieta Pert, MD  baclofen (LIORESAL) 10 MG tablet Take  1 tablet (10 mg total) by mouth 3 (three) times daily. Patient not taking: Reported on 04/09/2022 02/25/22   Pattricia Boss, MD  docusate sodium (COLACE) 100 MG capsule Take 1 capsule (100 mg total) by mouth daily. 04/17/22   Antonieta Pert, MD  ferrous sulfate 325 (65 FE) MG EC tablet Take 1 tablet (325 mg total) by mouth 2 (two) times daily. Patient taking differently: Take 1 tablet by mouth daily with breakfast. 01/08/22   Benay Pike, MD  ondansetron (ZOFRAN) 4 MG tablet Take 1 tablet (4 mg total) by mouth every 8 (eight) hours as needed for nausea or vomiting. 04/17/22   Antonieta Pert, MD  pantoprazole (PROTONIX) 40 MG tablet Take 1 tablet (40 mg total) by mouth daily. 04/17/22 05/17/22  Antonieta Pert, MD  prochlorperazine (COMPAZINE) 10 MG tablet Take 1 tablet (10 mg total) by mouth every 8 (eight) hours as needed (nausea or headache). Patient not taking: Reported on 04/09/2022 10/23/21   Maudie Flakes, MD  rosuvastatin (CRESTOR) 10 MG tablet Take 1 tablet (10 mg total) by mouth daily. 04/17/22 05/17/22  Antonieta Pert, MD  senna-docusate (SENOKOT-S) 8.6-50 MG tablet Take 2 tablets by mouth 2 (two) times daily for 14 days. 04/17/22 05/01/22  Antonieta Pert, MD  traMADol (ULTRAM) 50 MG tablet Take 1 tablet (50 mg total) by mouth every  6 (six) hours as needed for up to 12 doses. 04/17/22   Antonieta Pert, MD  hydrochlorothiazide (HYDRODIURIL) 25 MG tablet Take 1 tablet (25 mg total) by mouth daily. Must have office visit for refills 06/20/20 08/13/20  Brunetta Jeans, PA-C      Allergies    Patient has no known allergies.    Review of Systems   Review of Systems  Constitutional:  Negative for activity change, appetite change and fever.  HENT:  Negative for congestion and rhinorrhea.   Respiratory:  Negative for cough, chest tightness and shortness of breath.   Gastrointestinal:  Negative for abdominal pain, nausea and vomiting.  Genitourinary:  Negative for dysuria and hematuria.  Musculoskeletal:  Positive for  arthralgias and myalgias.  Skin:  Negative for rash.  Neurological:  Negative for dizziness, weakness, numbness and headaches.   all other systems are negative except as noted in the HPI and PMH.    Physical Exam Updated Vital Signs BP (!) 155/107 (BP Location: Right Arm)   Pulse 85   Temp 98.6 F (37 C) (Oral)   Resp 18   LMP 03/25/2022 Comment: Neg Preg Test today on 04/10/22.  SpO2 100%  Physical Exam Vitals and nursing note reviewed.  Constitutional:      General: She is not in acute distress.    Appearance: She is well-developed.  HENT:     Head: Normocephalic and atraumatic.     Mouth/Throat:     Pharynx: No oropharyngeal exudate.  Eyes:     Conjunctiva/sclera: Conjunctivae normal.     Pupils: Pupils are equal, round, and reactive to light.  Neck:     Comments: No meningismus.  Healing surgical incision R neck. Cardiovascular:     Rate and Rhythm: Normal rate and regular rhythm.     Heart sounds: Normal heart sounds. No murmur heard. Pulmonary:     Effort: Pulmonary effort is normal. No respiratory distress.     Breath sounds: Normal breath sounds.  Chest:     Chest wall: No tenderness.  Abdominal:     Palpations: Abdomen is soft.     Tenderness: There is no abdominal tenderness. There is no guarding or rebound.  Musculoskeletal:        General: No tenderness. Normal range of motion.     Cervical back: Normal range of motion and neck supple.     Comments: No obvious abnormality to left arm.  Intact radial pulse and cardinal hand movements.  Full range of motion of bilateral shoulders, elbows and wrists.  Equal grip strength bilaterally.  Skin:    General: Skin is warm.  Neurological:     Mental Status: She is alert and oriented to person, place, and time.     Cranial Nerves: No cranial nerve deficit.     Motor: No abnormal muscle tone.     Coordination: Coordination normal.     Comments:  5/5 strength throughout. CN 2-12 intact.Equal grip strength.    Psychiatric:        Behavior: Behavior normal.     ED Results / Procedures / Treatments   Labs (all labs ordered are listed, but only abnormal results are displayed) Labs Reviewed  CBC WITH DIFFERENTIAL/PLATELET - Abnormal; Notable for the following components:      Result Value   WBC 10.7 (*)    Hemoglobin 10.3 (*)    HCT 33.7 (*)    MCV 74.9 (*)    MCH 22.9 (*)    RDW 18.3 (*)  Platelets 500 (*)    All other components within normal limits  BASIC METABOLIC PANEL - Abnormal; Notable for the following components:   Glucose, Bld 104 (*)    All other components within normal limits  CK  TROPONIN I (HIGH SENSITIVITY)  TROPONIN I (HIGH SENSITIVITY)    EKG EKG Interpretation  Date/Time:  Tuesday April 21 2022 23:53:15 EST Ventricular Rate:  84 PR Interval:  202 QRS Duration: 92 QT Interval:  370 QTC Calculation: 437 R Axis:   7 Text Interpretation: Normal sinus rhythm Normal ECG When compared with ECG of 09-Apr-2022 13:12, PREVIOUS ECG IS PRESENT No significant change was found Confirmed by Ezequiel Essex [phone]) on 04/22/2022 2:14:35 AM  Radiology CT ANGIO UP EXTREM LEFT W &/OR WO CONTAST  Result Date: 04/22/2022 CLINICAL DATA:  Vascular murmur, left upper arm pain. Recent brachiocephalic thrombus on the right. EXAM: CT ANGIOGRAPHY OF THE LEFT UPPEREXTREMITY TECHNIQUE: Multidetector CT imaging of the left upperwas performed using the standard protocol during bolus administration of intravenous contrast. Multiplanar CT image reconstructions and MIPs were obtained to evaluate the vascular anatomy. RADIATION DOSE REDUCTION: This exam was performed according to the departmental dose-optimization program which includes automated exposure control, adjustment of the mA and/or kV according to patient size and/or use of iterative reconstruction technique. CONTRAST:  155m OMNIPAQUE IOHEXOL 350 MG/ML SOLN COMPARISON:  01/02/2021. FINDINGS: The visualized subclavian, axillary,  brachial, radial, ulnar, and interosseous arteries are patent. No acute fracture or dislocation is seen. The musculature and right upper extremity soft tissues are within normal limits. Multiple nodular densities are noted in the left breast measuring up to 3.2 cm, previously characterized as cysts by ultrasound. Review of the MIP images confirms the above findings. IMPRESSION: Patent left upper extremity arterial vasculature. Electronically Signed   By: LBrett FairyM.D.   On: 04/22/2022 05:03   CT ANGIO HEAD NECK W WO CM  Result Date: 04/22/2022 CLINICAL DATA:  Left arm pain EXAM: CT ANGIOGRAPHY HEAD AND NECK TECHNIQUE: Multidetector CT imaging of the head and neck was performed using the standard protocol during bolus administration of intravenous contrast. Multiplanar CT image reconstructions and MIPs were obtained to evaluate the vascular anatomy. Carotid stenosis measurements (when applicable) are obtained utilizing NASCET criteria, using the distal internal carotid diameter as the denominator. RADIATION DOSE REDUCTION: This exam was performed according to the departmental dose-optimization program which includes automated exposure control, adjustment of the mA and/or kV according to patient size and/or use of iterative reconstruction technique. CONTRAST:  1321mOMNIPAQUE IOHEXOL 350 MG/ML SOLN COMPARISON:  04/09/2022 FINDINGS: CT HEAD FINDINGS Brain: No evidence of acute infarction, hemorrhage, hydrocephalus, extra-axial collection or mass lesion/mass effect. Vascular: No hyperdense vessel or unexpected calcification. Skull: Normal. Negative for fracture or focal lesion. Sinuses/Orbits: Negative Review of the MIP images confirms the above findings CTA NECK FINDINGS Aortic arch: Normal. Brachiocephalic stent without residual thrombus or adjacent dissection seen. There is degradation overall mild image blurring from motion artifact. Right carotid system: Vessels are smoothly contoured and widely patent  Left carotid system: Vessels are smoothly contoured and widely patent Vertebral arteries: No proximal subclavian or vertebral stenosis or beading. Skeleton: No acute finding Other neck: Swelling in the right lateral neck with a bubble of gas, there has been recent carotid cutdown for brachiocephalic stenting. Recent thyroid ultrasound. Upper chest: No acute finding Review of the MIP images confirms the above findings CTA HEAD FINDINGS Anterior circulation: No branch occlusion, beading, aneurysm, or stenosis. Posterior circulation: No branch occlusion,  beading, aneurysm, or stenosis. Venous sinuses: Diffusely patent Anatomic variants: None significant Review of the MIP images confirms the above findings IMPRESSION: Uncomplicated interval brachiocephalic stent. No new abnormality or explanation for left arm symptoms. Electronically Signed   By: Jorje Guild M.D.   On: 04/22/2022 05:00   UE VENOUS DUPLEX (7am - 7pm)  Result Date: 04/21/2022 UPPER VENOUS STUDY  Patient Name:  SHERRILYN NAIRN  Date of Exam:   04/21/2022 Medical Rec #: [id number]             Accession #:    [id number] Date of Birth: 1975/09/15            Patient Gender: F Patient Age:   45 years Exam Location:  [facility] Procedure:      VAS [place] UPPER EXTREMITY VENOUS DUPLEX Referring Phys: MADISON REDWINE --------------------------------------------------------------------------------  Indications: Edema Comparison Study: no prior Performing Technologist: Archie Patten RVS  Examination Guidelines: A complete evaluation includes B-mode imaging, spectral Doppler, color Doppler, and power Doppler as needed of all accessible portions of each vessel. Bilateral testing is considered an integral part of a complete examination. Limited examinations for reoccurring indications may be performed as noted.  Right Findings: +----------+------------+---------+-----------+----------+-------+ RIGHT      CompressiblePhasicitySpontaneousPropertiesSummary +----------+------------+---------+-----------+----------+-------+ Subclavian               Yes       Yes                      +----------+------------+---------+-----------+----------+-------+  Left Findings: +----------+------------+---------+-----------+----------+-------+ LEFT      CompressiblePhasicitySpontaneousPropertiesSummary +----------+------------+---------+-----------+----------+-------+ IJV           Full       Yes       Yes                      +----------+------------+---------+-----------+----------+-------+ Subclavian    Full       Yes       Yes                      +----------+------------+---------+-----------+----------+-------+ Axillary      Full       Yes       Yes                      +----------+------------+---------+-----------+----------+-------+ Brachial      Full       Yes       Yes                      +----------+------------+---------+-----------+----------+-------+ Radial        Full                                          +----------+------------+---------+-----------+----------+-------+ Ulnar         Full                                          +----------+------------+---------+-----------+----------+-------+ Cephalic      Full                                          +----------+------------+---------+-----------+----------+-------+  Basilic       Full                                          +----------+------------+---------+-----------+----------+-------+  Summary:  Right: No evidence of thrombosis in the subclavian.  Left: No evidence of deep vein thrombosis in the upper extremity. No evidence of superficial vein thrombosis in the upper extremity.  *See table(s) above for measurements and observations.    Preliminary     Procedures Procedures    Medications Ordered in ED Medications - No data to display  ED Course/ Medical Decision Making/  A&P                             Medical Decision Making Amount and/or Complexity of Data Reviewed Labs: ordered. Decision-making details documented in ED Course. Radiology: ordered and independent interpretation performed. Decision-making details documented in ED Course. ECG/medicine tests: ordered and independent interpretation performed. Decision-making details documented in ED Course.  Risk Prescription drug management.   Recent hospitalization for CVA and found to have right brachiocephalic arterial thrombus currently on Eliquis.  Returns with ongoing left arm pain for the past 2 days with no injury.  No new neurological deficits.  Neurovascular intact on exam.  Intact pulse and strength.  Doppler was negative for DVT earlier today.  EKG is normal sinus rhythm.  Low suspicion for atypical angina.  Labs are reassuring with normal electrolytes and CK.  CTA is obtained that shows no vascular occlusion.  Doppler earlier today was negative for DVT.  CT neck shows stable brachiocephalic stent on the right side without acute complication.  Low suspicion for atypical ACS.  Troponin negative in the setting of 2 days of constant pain. Electrolytes are normal.  CK is normal.  Unclear etiology of patient's left arm pain though consider cervical radiculopathy.  No evidence of vascular occlusion.  No evidence of complication from her previous stent.  She has PCP follow-up later this morning at 10 AM.  Encouraged to keep this appointment.  Will not give anti-inflammatories given her anticoagulation use. Followup scheduled with vascular surgery and neurology as well.  Return to the ED sooner with difficulty breathing, shortness of breath, weakness in the arm, numbness or tingling or other concerns.        Final Clinical Impression(s) / ED Diagnoses Final diagnoses:  Left arm pain    Rx / DC Orders ED Discharge Orders     None         Sandford Diop, Annie Main, MD 04/22/22 [phone]

## 2022-04-22 NOTE — Discharge Instructions (Signed)
Testing is reassuring.  No evidence of blood clot in your left arm.  Your lab work is normal.  Use anti-inflammatories as prescribed and follow-up with your doctor this morning as scheduled.  Return to the ED with new or worsening symptoms

## 2022-04-22 NOTE — Telephone Encounter (Signed)
  Chief Complaint: arm pain  Symptoms: L arm pain 10/10, swelling  Frequency: 2 days Pertinent Negatives: NA Disposition: '[]'$ ED /'[]'$ Urgent Care (no appt availability in office) / '[]'$ Appointment(In office/virtual)/ '[]'$  [facility]/ '[]'$ Home Care/ '[]'$ Refused Recommended Disposition /'[]'$ [facility]/ '[x]'$  Follow-up with PCP Additional Notes: pt has been to ED and had OV today for arm pain, pt was given oxycodone from ED and then Amy, NP told her she can take Tramadol for pain or Tylenol. Pt states neither do anything for pain and wanting to take ibuprofen or muscle relaxer for pain but not able to with Elquis. Advised pt she isn't able to take Ibuprofen d/t drug interactions with Eliquis. Advised pt she can try baclofen to see if would help with pain but that I would send message back to see if something else can be prescribed to help. Pt states she is wanting anything to help with pain until she can see pain clinic.   Reason for Disposition  [1] SEVERE pain AND [2] not improved 2 hours after pain medicine  Answer Assessment - Initial Assessment Questions 1. ONSET: "When did the pain start?"     2 days  2. LOCATION: "Where is the pain located?"     L arm  3. PAIN: "How bad is the pain?" (Scale 1-10; or mild, moderate, severe)   - MILD (1-3): Doesn't interfere with normal activities.   - MODERATE (4-7): Interferes with normal activities (e.g., work or school) or awakens from sleep.   - SEVERE (8-10): Excruciating pain, unable to do any normal activities, unable to hold a cup of water.     10/10 5. CAUSE: "What do you think is causing the arm pain?"     Surgery and blood clot  6. OTHER SYMPTOMS: "Do you have any other symptoms?" (e.g., neck pain, swelling, rash, fever, numbness, weakness)     Swelling  Protocols used: Arm Pain-A-AH

## 2022-04-22 NOTE — Patient Instructions (Signed)
Apixaban Tablets What is this medication? APIXABAN (a PIX a ban) prevents and treats blood clots. It is also used to lower the risk of stroke in people with AFib (atrial fibrillation). It belongs to a group of medications called blood thinners. This medicine may be used for other purposes; ask your health care provider or pharmacist if you have questions. COMMON BRAND NAME(S): Eliquis What should I tell my care team before I take this medication? They need to know if you have any of these conditions: Antiphospholipid antibody syndrome Bleeding disorder History of bleeding in the brain History of blood clots History of stomach bleeding Kidney disease Liver disease Mechanical heart valve Spinal surgery An unusual or allergic reaction to apixaban, other medications, foods, dyes, or preservatives Pregnant or trying to get pregnant Breastfeeding How should I use this medication? Take this medication by mouth. For your therapy to work as well as possible, take each dose exactly as prescribed on the prescription label. Do not skip doses. Skipping doses or stopping this medication can increase your risk of a blood clot or stroke. Keep taking this medication unless your care team tells you to stop. Take it as directed on the prescription label at the same time every day. You can take it with or without food. If it upsets your stomach, take it with food. A special MedGuide will be given to you by the pharmacist with each prescription and refill. Be sure to read this information carefully each time. Talk to your care team about the use of this medication in children. Special care may be needed. Overdosage: If you think you have taken too much of this medicine contact a poison control center or emergency room at once. NOTE: This medicine is only for you. Do not share this medicine with others. What if I miss a dose? If you miss a dose, take it as soon as you can. If it is almost time for your next  dose, take only that dose. Do not take double or extra doses. What may interact with this medication? This medication may interact with the following: Aspirin and aspirin-like medications Certain medications for fungal infections like itraconazole and ketoconazole Certain medications for seizures like carbamazepine and phenytoin Certain medications for blood clots like enoxaparin, dalteparin, heparin, and warfarin Clarithromycin NSAIDs, medications for pain and inflammation, like ibuprofen or naproxen Rifampin Ritonavir St. John's wort This list may not describe all possible interactions. Give your health care provider a list of all the medicines, herbs, non-prescription drugs, or dietary supplements you use. Also tell them if you smoke, drink alcohol, or use illegal drugs. Some items may interact with your medicine. What should I watch for while using this medication? Visit your care team for regular checks on your progress. Your condition will be monitored carefully while you are receiving this medication. You may need blood work while taking this medication. Avoid sports and activities that might cause injury while you are using this medication. Severe falls or injuries can cause unseen bleeding. Be careful when using sharp tools or knives. Consider using an Copy. Take special care brushing or flossing your teeth. Report any injuries, bruising, or red spots on the skin to your care team. If you are going to need surgery or other procedure, tell your care team that you are taking this medication. Wear a medical ID bracelet or chain. Carry a card that describes your condition. List the medications and doses you take on the card. What side effects may I  notice from receiving this medication? Side effects that you should report to your care team as soon as possible: Allergic reactions--skin rash, itching, hives, swelling of the face, lips, tongue, or throat Bleeding--bloody or black,  tar-like stools, vomiting blood or brown material that looks like coffee grounds, red or dark brown urine, small red or purple spots on the skin, unusual bruising or bleeding Bleeding in the brain--severe headache, stiff neck, confusion, dizziness, change in vision, numbness or weakness of the face, arm, or leg, trouble speaking, trouble walking, vomiting Heavy periods This list may not describe all possible side effects. Call your doctor for medical advice about side effects. You may report side effects to FDA at 1-800-FDA-1088. Where should I keep my medication? Keep out of the reach of children and pets. Store at room temperature between 20 and 25 degrees C (68 and 77 degrees F). Get rid of any unused medication after the expiration date. To get rid of medications that are no longer needed or expired: Take the medication to a medication take-back program. Check with your pharmacy or law enforcement to find a location. If you cannot return the medication, check the label or package insert to see if the medication should be thrown out in the garbage or flushed down the toilet. If you are not sure, ask your care team. If it is safe to put in the trash, empty the medication out of the container. Mix the medication with cat litter, dirt, coffee grounds, or other unwanted substance. Seal the mixture in a bag or container. Put it in the trash. NOTE: This sheet is a summary. It may not cover all possible information. If you have questions about this medicine, talk to your doctor, pharmacist, or health care provider.  2023 Elsevier/Gold Standard (2020-04-19 00:00:00)

## 2022-04-22 NOTE — Progress Notes (Signed)
Patient ID: Connie Herrera, female    DOB: [birth year]  MRN: [id number]  CC: Hospital Discharge Follow-Up  Subjective: Connie Herrera is a 47 y.o. female who presents for hospital discharge department follow-up.   Her concerns today include:  04/09/2022 - 04/17/2022 [facility] per MD note: Admit date: 04/09/2022 Discharge date: 04/17/2022 Recommendations for Outpatient Follow-up:  Follow up with PCP in 1 weeks-call for appointment You will need biopsy of the thyroid nodules follow-up with PCP in a month Check CBC BMP in 1 week from PCP Follow-up with Dr. Virl Cagey vascular surgery to repeat a CTA Follow-up with neurology for possible stroke follow-up in 2 to 3 weeks    Brief/Interim Summary: 47 y.o. female with a past medical history of essential hypertension, previous history of transient numbness and weakness which have resolved spontaneously.  She has been seen by neurology previously.  Underwent lumbar puncture in January 2023.  She was in her usual state of health till this morning when she went to see her primary care provider.  This was for a routine visit.  She came back to her house and then noticed that her left arm was weak.  She had similar symptoms about a week ago but thought that weakness resolved spontaneously. In the emergency department she underwent MRI brain which showed multiple small areas of diffusion hyperintense signal in the occipital parietal lobes bilaterally.  Chronic changes were also noted.  Etiology for these findings were not clear.  Neurology was consulted.   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  Heparin was transitioned to Eliquis.  Patient need further rest workup with TEE which is scheduled for outpatient on 1/15 which cardio will arrange. Patient completed stroke workup CT head CTA head and neck MRI brain with multiple small areas of diffusion  hyperintense signal in the occipital parietal lobes bilaterally CT nonocclusive brachiocephalic artery thrombosis, echo bubble study EF 55% Doppler negative for DVT TCD bubble study negative for PFO, LDL 68 HbA1c 6.0, UDS positive for THC hypercoagulable workup and autoimmune workup was negative.  Plan is to continue Eliquis and aspirin for 1 month then repeat CTA in 1 month to decide further regimen   Discharge Diagnoses:  Principal Problem:   Acute CVA (cerebrovascular accident) ([place]) Active Problems:   Hypertension   Anemia due to blood loss, chronic   Left arm weakness   Iron deficiency anemia   Hypokalemia   Arterial thrombosis (HCC)   Acute stroke:completed stroke workup CT head CTA head and neck MRI brain with multiple small areas of diffusion hyperintense signal in the occipital parietal lobes bilaterally CT nonocclusive brachiocephalic artery thrombosis, echo bubble study EF 55% Doppler negative for DVT TCD bubble study negative for PFO, LDL 68 HbA1c 6.0, UDS positive for THC hypercoagulable workup and autoimmune workup was negative. ?  Demyelinating process.  Underwent lumbar puncture on MRI brain cervical spine and thoracic spine.  Plan is to continue TEE as outpatient, Eliquis, aspirin.   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 DVT neg in LE , Hypercoagulable panel AA work up neg >  s/p brachiocephalic artery stenting on 1/9> Dr. Unk Lightning transitioned to Eliquis plus aspirin for now with plan to follow-up in 1 month and decide on transition to dual antiplatelet therapy at that time.  Hemoglobin has remained stable.   Essential hypertension:  Blood pressure remains well-controlled without medication follow-up with PCP to resume Hypokalemia: Resolved HyponatremiaStable.   Microcytic anemia/iron deficiency: 2/2 chronic menorrhagia.  Underwent anemia panel workup that showed> ferritin of 5, iron of 26,  TIBC 533, percent saturation 5.  Vitamin B12 level is 448, folic acid 18.5.  S/P ferric gluconate infusion completed   Left thyroid nodule:Imaging studies suggested nodules in both lobes.  Ultrasound was done which revealed a nodule of concern in the left thyroid lobe.  They recommend a biopsy.  TSH is normal. This was communicated to the patient.  Due to more urgent acute issues this was not pursued during this hospital stay.  Patient will be on anticoagulation for a month.  And then she will likely be on dual antiplatelet treatment.  Biopsy to be pursuedin the next 2-3 months depending on her progress and what anticoagulant/antiplatelet agent she is on at that time.     Menorrhagia/uterine fibroids:Has not followed up with gynecology in more than a year.  Patient encouraged to follow-up with her gynecologist.  If she does get placed on oral anticoagulants at discharge then it would increase her risk of significant menorrhagia.   Situational anxiety and insomnia:Continue with the low-dose alprazolam as needed and melatonin for insomnia.   Constipation: Continue laxatives.   Discharge Instructions       Ambulatory referral to Neurology   Complete by: As directed      Follow up with Dr. Leta Baptist at [facility] in 4 weeks. Pt is Dr. Gladstone Lighter pt. Thanks.    Discharge instructions   Complete by: As directed      You will need biopsy of the thyroid nodules follow-up with PCP in a month Check CBC BMP in 1 week from PCP Follow-up with Dr. Virl Cagey vascular surgery to repeat a CTA    Follow-Ups  Schedule an appointment with Broadus John, MD (Vascular Surgery) in 1 month (05/17/2022); post hospitalization follow up Follow up with Camillia Herter, NP (Nurse Practitioner) in 1 week (04/24/2022); post hospitalization follow up. discuss plan for thyroid nodule biopsy Schedule an appointment with Penni Bombard, MD (Neurology) in 1 month (05/17/2022) Follow up with Camillia Herter, NP (Nurse  Practitioner) in 1 week (04/17/2022)  04/21/2022 [facility] per MD note: Clinical Course as of 04/21/22 2304  Tue Apr 21, 2022  1739 I spoke to Dr Quinn Axe from neurology regarding the likelihood that there may be a lesion of the brain or spinal cord that would correlate to this new onset of pain in her left arm, and the neurologist does not think this would be the case.  Patient was conversation I do not see an indication for emergent repeat imaging of the head or cervical spine.  I am waiting to hear from vascular surgery regarding any recommendations for further vascular imaging while the patient is here. [MT]  6314 I repaged vascular surgeon via office [MT]  1845 I spoke to Dr Carlis Abbott from vascular surgery who has no further recommendations at this time, feels the patient can follow-up as an outpatient.  Patient is content with this plan and wanting to go home. [MT]     Clinical Course User Index [MT] Trifan, Carola Rhine, MD                              Medical Decision Making   This patient presents to the ED with concern for left arm  pain. This involves an extensive number of treatment options, and is a complaint that carries with it a high risk of complications and morbidity.  The differential diagnosis includes vascular problem vs occlusion vs DVT vs demyelinating lesion vs other   Co-morbidities that complicate the patient evaluation: hx of thrombosis   External records from outside source obtained and reviewed including hospital discharge summary Jan 2024, Vascular surgery evaluation notes Dr Dorise Bullion   Bubble study negative for PFO during stroke workup last week   I ordered and personally interpreted labs.  The pertinent results include: No significant findings   I ordered imaging studies including vascular venous ultrasound left upper extremity I independently visualized and interpreted imaging which showed no DVT.  Per my discussion with  ultrasound technician there was good triphasic arterial flow as well, no evidence of arterial occlusion. I agree with the radiologist interpretation     Test Considered: This point have a low suspicion for CNS lesion or acute arterial thrombosis, per my discussion with neurology as well as vascular surgery, I do not see an indication for further imaging     After the interventions noted above, I reevaluated the patient and found that they have: improved   Patient appears comfortable in the room.  Pain is not significant.   Dispostion:   After consideration of the diagnostic results and the patients response to treatment, I feel that the patent would benefit from close outpatient follow-up.  She has a PCP appointment tomorrow.  She will see neurology and vascular as an outpatient.   I do not see evidence of infection, ischemia, or CNS lesion at this time, and I think she is reasonably stable for discharge.  04/22/2022 [facility] per MD note: Medical Decision Making Amount and/or Complexity of Data Reviewed Labs: ordered. Decision-making details documented in ED Course. Radiology: ordered and independent interpretation performed. Decision-making details documented in ED Course. ECG/medicine tests: ordered and independent interpretation performed. Decision-making details documented in ED Course.   Risk Prescription drug management.     Recent hospitalization for CVA and found to have right brachiocephalic arterial thrombus currently on Eliquis.  Returns with ongoing left arm pain for the past 2 days with no injury.  No new neurological deficits.   Neurovascular intact on exam.  Intact pulse and strength.  Doppler was negative for DVT earlier today.   EKG is normal sinus rhythm.  Low suspicion for atypical angina.  Labs are reassuring with normal electrolytes and CK.   CTA is obtained that shows no vascular occlusion.  Doppler earlier today was negative for  DVT.  CT neck shows stable brachiocephalic stent on the right side without acute complication.   Low suspicion for atypical ACS.  Troponin negative in the setting of 2 days of constant pain. Electrolytes are normal.  CK is normal.   Unclear etiology of patient's left arm pain though consider cervical radiculopathy.  No evidence of vascular occlusion.  No evidence of complication from her previous stent.   She has PCP follow-up later this morning at 10 AM.  Encouraged to keep this appointment.  Will not give anti-inflammatories given her anticoagulation use. Followup scheduled with vascular surgery and neurology as well.   Return to the ED sooner with difficulty breathing, shortness of breath, weakness in the arm, numbness or tingling or other concerns.   Today's visit 04/22/2022: Left arm pain persisting. She denies red flag symptoms. States Tramadol does not help with pain and that she is  not "a drug addict". States she is aware of her upcoming appointments with Vascular Surgery, Neurology, and Oncology/Hematology. Confirms she has no concerns with right neck incision secondary to procedure completed at the hospital other than making sure she does not need to put any antibiotic cream on it. Reports the MD at the hospital told her that she does not need to put any antibiotic cream on incision and I confirmed the same. She is taking Eliquis and Aspirin/additional prescribed medications as prescribed, no issues/concerns. Reports she was told at hospital discharge by the MD to not take Amlodipine for blood pressure management due to being on Eliquis. States she is not taking Alprazolam because she "doesn't need it" for anxiety depression management. I discussed with patient that per our office policy I do not prescribe Alprazolam and should she need it in the future a referral to Psychiatry will be indicated. Patient verbalized understanding and declined referral to Psychiatry as of present. States she is  lashing out at her children because of increased stress related to her health. Also, having some issues sleeping. States Melatonin given during her hospital stay helped and she plans to purchase over-the-counter to continue. She denies thoughts of self-harm, suicidal ideations, and homicidal ideations.   Patient Active Problem List   Diagnosis Date Noted   Acute CVA (cerebrovascular accident) ([place]) 04/11/2022   Arterial thrombosis ([place]) 04/11/2022   Left arm weakness 04/09/2022   Iron deficiency anemia 04/09/2022   Hypokalemia 04/09/2022   Muscle spasms of both lower extremities 08/14/2020   Fibroid uterus 08/14/2020   Anemia due to blood loss, chronic 01/10/2020   Menorrhagia with regular cycle 01/09/2020   Class 1 obesity due to excess calories with body mass index (BMI) of 34.0 to 34.9 in adult 01/09/2020   Screening breast examination 02/07/2019   Breast pain 02/07/2019   Hypertension 01/26/2019   Paresthesias 08/04/2013   Breast pain, right 03/11/2012     Current Outpatient Medications on File Prior to Visit  Medication Sig Dispense Refill   APIXABAN (ELIQUIS) VTE STARTER PACK ('10MG'$  AND '5MG'$ ) Take as directed on package: start with two-'5mg'$  tablets twice daily until 04/22/2021. On 04/23/22 switch to one-'5mg'$  tablet twice daily. 74 each 0   aspirin EC 81 MG tablet Take 1 tablet (81 mg total) by mouth daily at 6 (six) AM. Swallow whole. 30 tablet 0   baclofen (LIORESAL) 10 MG tablet Take 1 tablet (10 mg total) by mouth 3 (three) times daily. (Patient not taking: Reported on 04/09/2022) 30 each 0   docusate sodium (COLACE) 100 MG capsule Take 1 capsule (100 mg total) by mouth daily. 10 capsule 0   ferrous sulfate 325 (65 FE) MG EC tablet Take 1 tablet (325 mg total) by mouth 2 (two) times daily. (Patient taking differently: Take 1 tablet by mouth daily with breakfast.) 60 tablet 3   ondansetron (ZOFRAN) 4 MG tablet Take 1 tablet (4 mg total) by mouth every 8 (eight) hours as needed for nausea  or vomiting. 20 tablet 0   pantoprazole (PROTONIX) 40 MG tablet Take 1 tablet (40 mg total) by mouth daily. 30 tablet 0   prochlorperazine (COMPAZINE) 10 MG tablet Take 1 tablet (10 mg total) by mouth every 8 (eight) hours as needed (nausea or headache). (Patient not taking: Reported on 04/09/2022) 20 tablet 0   rosuvastatin (CRESTOR) 10 MG tablet Take 1 tablet (10 mg total) by mouth daily. 30 tablet 0   senna-docusate (SENOKOT-S) 8.6-50 MG tablet Take 2 tablets by  mouth 2 (two) times daily for 14 days. 56 tablet 0   traMADol (ULTRAM) 50 MG tablet Take 1 tablet (50 mg total) by mouth every 6 (six) hours as needed for up to 12 doses. 12 tablet 0   [DISCONTINUED] hydrochlorothiazide (HYDRODIURIL) 25 MG tablet Take 1 tablet (25 mg total) by mouth daily. Must have office visit for refills 30 tablet 0   No current facility-administered medications on file prior to visit.    No Known Allergies  Social History   Socioeconomic History   Marital status: Single    Spouse name: Not on file   Number of children: 4   Years of education: 14   Highest education level: Some college, no degree  Occupational History   Occupation: Dietary Supervisor    Comment: [employer]  Tobacco Use   Smoking status: Former    Packs/day: 0.50    Years: 4.00    Total pack years: 2.00    Types: Cigarettes    Quit date: 06/04/2017    Years since quitting: 4.8   Smokeless tobacco: Never  Vaping Use   Vaping Use: Never used  Substance and Sexual Activity   Alcohol use: No   Drug use: Not Currently    Types: Marijuana    Comment: 09/30/20 maybe once a week   Sexual activity: Yes    Birth control/protection: Surgical  Other Topics Concern   Not on file  Social History Narrative   Patient lives at home with children.    Patient has 4 children.    Patient is single.    Patient has 2 years of college.    Patient is right handed.    Patient is working full time. [employer].    Social Determinants of  Health   Financial Resource Strain: Not on file  Food Insecurity: No Food Insecurity (04/10/2022)   Hunger Vital Sign    Worried About Running Out of Food in the Last Year: Never true    Ran Out of Food in the Last Year: Never true  Transportation Needs: No Transportation Needs (04/10/2022)   PRAPARE - Hydrologist (Medical): No    Lack of Transportation (Non-Medical): No  Physical Activity: Not on file  Stress: Not on file  Social Connections: Not on file  Intimate Partner Violence: Not At Risk (04/10/2022)   Humiliation, Afraid, Rape, and Kick questionnaire    Fear of Current or Ex-Partner: No    Emotionally Abused: No    Physically Abused: No    Sexually Abused: No    Family History  Problem Relation Age of Onset   Healthy Mother    Cirrhosis Father    Cancer Maternal Grandmother 75       breast   Breast cancer Maternal Grandmother     Past Surgical History:  Procedure Laterality Date   AORTOGRAM N/A 04/14/2022   Procedure: DIAGNOSTIC AORTOGRAM;  Surgeon: Broadus John, MD;  Location: [facility] OR;  Service: Vascular;  Laterality: N/A;   ENDARTERECTOMY Right 04/14/2022   Procedure: CAROTID CUTDOWN WITH BRACHIOCEPHALIC STENTING, WITH ULTRASOUND OF THE RIGHT FEMORAL VEIN;  Surgeon: Broadus John, MD;  Location: [facility];  Service: Vascular;  Laterality: Right;   TUBAL LIGATION      ROS: Review of Systems Negative except as stated above  PHYSICAL EXAM: BP 123/86 (BP Location: Left Arm, Patient Position: Sitting, Cuff Size: Large)   Pulse 88   Temp 98 F (36.7 C)  Resp 16   Wt 223 lb (101.2 kg)   LMP 03/25/2022 Comment: Neg Preg Test today on 04/10/22.  SpO2 97%   BMI 35.99 kg/m   Physical Exam HENT:     Head: Normocephalic and atraumatic.  Eyes:     Extraocular Movements: Extraocular movements intact.     Conjunctiva/sclera: Conjunctivae normal.     Pupils: Pupils are equal, round, and reactive to light.  Neck:     Comments: Surgical  incision right lateral neck clean/dry/intact.  Cardiovascular:     Rate and Rhythm: Normal rate and regular rhythm.     Pulses: Normal pulses.     Heart sounds: Normal heart sounds.  Pulmonary:     Effort: Pulmonary effort is normal.     Breath sounds: Normal breath sounds.  Musculoskeletal:        General: Normal range of motion.     Right shoulder: Normal.     Left shoulder: Normal.     Right upper arm: Normal.     Left upper arm: Normal.     Right elbow: Normal.     Left elbow: Normal.     Right forearm: Normal.     Left forearm: Normal.     Right wrist: Normal.     Left wrist: Normal.     Right hand: Normal.     Left hand: Normal.     Cervical back: Normal range of motion and neck supple.     Comments: Strength 5/5.   Skin:    General: Skin is warm and dry.     Capillary Refill: Capillary refill takes less than 2 seconds.  Neurological:     General: No focal deficit present.     Mental Status: She is alert and oriented to person, place, and time.  Psychiatric:        Mood and Affect: Affect is tearful.    ASSESSMENT AND PLAN: 1. Hospital discharge follow-up - Reviewed hospital course, current medications, ensured proper follow-up in place, and addressed concerns.   2. Acute CVA (cerebrovascular accident) ([place]) 3. Arterial thrombosis ([place]) 4. Left arm weakness - Continue present management.  - Keep all scheduled appointments with Neurology.  - Keep all scheduled appointments with Vascular Surgery.   5. Hypertension, unspecified type - Blood pressure remains well-controlled without medication follow-up with primary provider in 3 months or sooner if needed. Patient verbalized understanding.   6. Anemia due to blood loss, chronic 7. Iron deficiency anemia, unspecified iron deficiency anemia type - Continue present management.  - Keep all scheduled appointments with Oncology/Hematology.   8. Menorrhagia with regular cycle 9. Uterine leiomyoma, unspecified  location - Referral to Gynecology for further evaluation/management.  - Ambulatory referral to Gynecology  10. Left thyroid nodule - Referral to Endocrinology for further evaluation/management.  - Ambulatory referral to Endocrinology  11. Situational mixed anxiety and depressive disorder - Patient denies thoughts of self-harm, suicidal ideations, homicidal ideations. - Patient declined pharmacological management as of present.  - Patient declined referral to Psychiatry.  - Referral to Rosana Hoes, LCSWA for counseling/community resources.  - Patient aware to follow-up with primary provider as scheduled.   12. Hypokalemia - Resolved.   13. Hyponatremia - Resolved.   14. Pain - Referral to Pain Clinic for further evaluation/management.  - Ambulatory referral to Pain Clinic  Patient was given the opportunity to ask questions.  Patient verbalized understanding of the plan and was able to repeat key elements of the plan. Patient was given clear  instructions to go to Emergency Department or return to medical center if symptoms don't improve, worsen, or new problems develop.The patient verbalized understanding.   Orders Placed This Encounter  Procedures   Ambulatory referral to Endocrinology   Ambulatory referral to Gynecology   Ambulatory referral to Pain Clinic   Follow-up with primary provider as scheduled.   Camillia Herter, NP

## 2022-04-23 ENCOUNTER — Emergency Department (HOSPITAL_COMMUNITY)
Admission: EM | Admit: 2022-04-23 | Discharge: 2022-04-23 | Discharge status: Against Medical Advice | Payer: BLUE CROSS/BLUE SHIELD | Attending: Emergency Medicine | Admitting: Emergency Medicine

## 2022-04-23 ENCOUNTER — Other Ambulatory Visit: Payer: Self-pay

## 2022-04-23 ENCOUNTER — Encounter (HOSPITAL_BASED_OUTPATIENT_CLINIC_OR_DEPARTMENT_OTHER): Payer: Self-pay

## 2022-04-23 ENCOUNTER — Ambulatory Visit: Payer: Self-pay | Admitting: *Deleted

## 2022-04-23 ENCOUNTER — Emergency Department (HOSPITAL_BASED_OUTPATIENT_CLINIC_OR_DEPARTMENT_OTHER)
Admission: EM | Admit: 2022-04-23 | Discharge: 2022-04-23 | Disposition: A | Discharge status: Home / Self Care | Payer: BLUE CROSS/BLUE SHIELD | Source: Home / Self Care | Attending: Emergency Medicine | Admitting: Emergency Medicine

## 2022-04-23 DIAGNOSIS — N939 Abnormal uterine and vaginal bleeding, unspecified: Secondary | ICD-10-CM | POA: Insufficient documentation

## 2022-04-23 DIAGNOSIS — Z5321 Procedure and treatment not carried out due to patient leaving prior to being seen by health care provider: Secondary | ICD-10-CM | POA: Diagnosis not present

## 2022-04-23 DIAGNOSIS — Z7982 Long term (current) use of aspirin: Secondary | ICD-10-CM | POA: Insufficient documentation

## 2022-04-23 DIAGNOSIS — Z79899 Other long term (current) drug therapy: Secondary | ICD-10-CM | POA: Insufficient documentation

## 2022-04-23 DIAGNOSIS — I10 Essential (primary) hypertension: Secondary | ICD-10-CM | POA: Insufficient documentation

## 2022-04-23 DIAGNOSIS — Z7901 Long term (current) use of anticoagulants: Secondary | ICD-10-CM | POA: Insufficient documentation

## 2022-04-23 DIAGNOSIS — N938 Other specified abnormal uterine and vaginal bleeding: Secondary | ICD-10-CM | POA: Insufficient documentation

## 2022-04-23 LAB — COMPREHENSIVE METABOLIC PANEL
ALT: 20 U/L (ref 0–44)
AST: 14 U/L — ABNORMAL LOW (ref 15–41)
Albumin: 3.5 g/dL (ref 3.5–5.0)
Alkaline Phosphatase: 67 U/L (ref 38–126)
Anion gap: 9 (ref 5–15)
BUN: 11 mg/dL (ref 6–20)
CO2: 22 mmol/L (ref 22–32)
Calcium: 8.7 mg/dL — ABNORMAL LOW (ref 8.9–10.3)
Chloride: 104 mmol/L (ref 98–111)
Creatinine, Ser: 0.85 mg/dL (ref 0.44–1.00)
GFR, Estimated: >60 mL/min (ref >60)
Glucose, Bld: 131 mg/dL — ABNORMAL HIGH (ref 70–99)
Potassium: 3.3 mmol/L — ABNORMAL LOW (ref 3.5–5.1)
Sodium: 135 mmol/L (ref 135–145)
Total Bilirubin: 0.2 mg/dL — ABNORMAL LOW (ref 0.3–1.2)
Total Protein: 7.1 g/dL (ref 6.5–8.1)

## 2022-04-23 LAB — ABO/RH: ABO/RH(D): B POS

## 2022-04-23 LAB — I-STAT CHEM 8, ED
BUN: 10 mg/dL (ref 6–20)
Calcium, Ion: 1.15 mmol/L (ref 1.15–1.40)
Chloride: 102 mmol/L (ref 98–111)
Creatinine, Ser: 0.7 mg/dL (ref 0.44–1.00)
Glucose, Bld: 134 mg/dL — ABNORMAL HIGH (ref 70–99)
HCT: 34 % — ABNORMAL LOW (ref 36.0–46.0)
Hemoglobin: 11.6 g/dL — ABNORMAL LOW (ref 12.0–15.0)
Potassium: 3.3 mmol/L — ABNORMAL LOW (ref 3.5–5.1)
Sodium: 140 mmol/L (ref 135–145)
TCO2: 24 mmol/L (ref 22–32)

## 2022-04-23 LAB — I-STAT BETA HCG BLOOD, ED (MC, WL, AP ONLY): I-stat hCG, quantitative: <5 m[IU]/mL (ref <5)

## 2022-04-23 LAB — CBC WITH DIFFERENTIAL/PLATELET
Abs Immature Granulocytes: 0.03 K/uL (ref 0.00–0.07)
Basophils Absolute: 0 K/uL (ref 0.0–0.1)
Basophils Relative: 0 %
Eosinophils Absolute: 0.1 K/uL (ref 0.0–0.5)
Eosinophils Relative: 1 %
HCT: 32.9 % — ABNORMAL LOW (ref 36.0–46.0)
Hemoglobin: 9.8 g/dL — ABNORMAL LOW (ref 12.0–15.0)
Immature Granulocytes: 0 %
Lymphocytes Relative: 21 %
Lymphs Abs: 1.5 K/uL (ref 0.7–4.0)
MCH: 23.1 pg — ABNORMAL LOW (ref 26.0–34.0)
MCHC: 29.8 g/dL — ABNORMAL LOW (ref 30.0–36.0)
MCV: 77.4 fL — ABNORMAL LOW (ref 80.0–100.0)
Monocytes Absolute: 0.4 K/uL (ref 0.1–1.0)
Monocytes Relative: 6 %
Neutro Abs: 5.1 K/uL (ref 1.7–7.7)
Neutrophils Relative %: 72 %
Platelets: 533 K/uL — ABNORMAL HIGH (ref 150–400)
RBC: 4.25 MIL/uL (ref 3.87–5.11)
RDW: 18.3 % — ABNORMAL HIGH (ref 11.5–15.5)
WBC: 7.2 K/uL (ref 4.0–10.5)
nRBC: 0 % (ref 0.0–0.2)

## 2022-04-23 LAB — TYPE AND SCREEN
ABO/RH(D): B POS
Antibody Screen: NEGATIVE

## 2022-04-23 NOTE — Telephone Encounter (Signed)
PCP aware

## 2022-04-23 NOTE — ED Triage Notes (Signed)
Patient BIB [facility] from home, history of vascular stent six days ago and was placed on eliquis, then patient started having vaginal bleeding last night. Patient states she was instructed to stop amlodipine while on eliquis.  EMS Vitals 164/90 HR 84

## 2022-04-23 NOTE — Telephone Encounter (Signed)
  Chief Complaint: Heavy vaginal bleeding.   On Eliquis Symptoms: "It's pouring out of me like water".    "I'm sitting on the toilet now with blood everywhere".   "My daughter is on the way to take me to the hospital"Started last night but today it's pouring out of me like water. Frequency: Constantly bleeding vaginally Pertinent Negatives: Patient denies feeling lightheaded or dizzy.   "I actually feel fine".  Disposition: '[x]'$ ED /'[]'$ Urgent Care (no appt availability in office) / '[]'$ Appointment(In office/virtual)/ '[]'$  [facility]/ '[]'$ Home Care/ '[]'$ Refused Recommended Disposition /'[]'$ [facility]/ '[]'$  Follow-up with PCP Additional Notes: Agreeable to going to the ED.   Daughter taking her.

## 2022-04-23 NOTE — Discharge Instructions (Signed)
You are going to notice being on the aspirin and Eliquis your periods will be heavier however they should stop the way your typical.  Would stop.  May last a day or 2 longer.  Your blood counts are the same today.  Continue the Eliquis and aspirin at this time until you follow-up with your specialist.

## 2022-04-23 NOTE — ED Notes (Signed)
Pt notified staff that she was leaving due to the wait time. Pt encouraged to wait to see provider. Pt visualized walking out of waiting room without difficulty.

## 2022-04-23 NOTE — ED Provider Triage Note (Signed)
Emergency Medicine Provider Triage Evaluation Note  Connie Herrera , a 47 y.o. female  was evaluated in triage.  Pt complains of heavy vaginal bleeding since last night. Patient is recently on Eliquis for a recent CVA. She has been seen on 1/16 & 1/17. She reports the bleeding started last night. She has gone through 8 ultra pads since then. Reports bright red blood with some clots.  Review of Systems  Positive:  Negative:   Physical Exam  BP (!) 158/103 (BP Location: Left Arm)   Pulse 87   Temp 98.2 F (36.8 C) (Oral)   Resp 20   LMP 03/25/2022 Comment: Neg Preg Test today on 04/10/22.  SpO2 100%  Gen:   Awake, no distress   Resp:  Normal effort  MSK:   Moves extremities without difficulty  Other:  Abdomen soft and non tender.  Medical Decision Making  Medically screening exam initiated at 3:14 PM.  Appropriate orders placed.  Connie Herrera was informed that the remainder of the evaluation will be completed by another provider, this initial triage assessment does not replace that evaluation, and the importance of remaining in the ED until their evaluation is complete.  Labs ordered.   Sherrell Puller, PA-C 04/23/22 1517

## 2022-04-23 NOTE — Telephone Encounter (Signed)
Reason for Disposition  SEVERE vaginal bleeding (e.g., soaking 2 pads or tampons per hour and present 2 or more hours; 1 menstrual cup every 2 hours)    On Eliquis  Answer Assessment - Initial Assessment Questions 1. AMOUNT: "Describe the bleeding that you are having."    - SPOTTING: spotting, or pinkish / brownish mucous discharge; does not fill panty liner or pad    - MILD:  less than 1 pad / hour; less than patient's usual menstrual bleeding   - MODERATE: 1-2 pads / hour; 1 menstrual cup every 6 hours; small-medium blood clots (e.g., pea, grape, small coin)   - SEVERE: soaking 2 or more pads/hour for 2 or more hours; 1 menstrual cup every 2 hours; bleeding not contained by pads or continuous red blood from vagina; large blood clots (e.g., golf ball, large coin)      I'm on Eliquis.   I'm having vaginal bleeding real bad.    Last night it started but today it's running out of me like water.   I'm filling up pads in an hour.   I had a blood clot between my arm and my heart so they put in a stent so that's why I'm on Eliquis.  2. ONSET: "When did the bleeding begin?" "Is it continuing now?"     It started last night but today it's running out of me like water.   I'm sitting on the toilet now waiting on my daughter to come help me clean up all the blood before going to the hospital.    I instructed her to call 911 if she became dizzy, lightheaded or passed out.   She verbalized understanding.   "I've never seen so much blood".   "It's just pouring out of me".   I told her to get to the ED as soon as possible.   She thanked me for my help and said she would be going shortly.    3. MENSTRUAL PERIOD: "When was the last normal menstrual period?" "How is this different than your period?"     Not asked 4. REGULARITY: "How regular are your periods?"     Not asked 5. ABDOMEN PAIN: "Do you have any pain?" "How bad is the pain?"  (e.g., Scale 1-10; mild, moderate, or severe)   - MILD (1-3): doesn't interfere  with normal activities, abdomen soft and not tender to touch    - MODERATE (4-7): interferes with normal activities or awakens from sleep, abdomen tender to touch    - SEVERE (8-10): excruciating pain, doubled over, unable to do any normal activities      No 6. PREGNANCY: "Is there any chance you are pregnant?" "When was your last menstrual period?"     Not asked 7. BREASTFEEDING: "Are you breastfeeding?"     N/A 8. HORMONE MEDICINES: "Are you taking any hormone medicines, prescription or over-the-counter?" (e.g., birth control pills, estrogen)     N/A 9. BLOOD THINNER MEDICINES: "Do you take any blood thinners?" (e.g., Coumadin / warfarin, Pradaxa / dabigatran, aspirin)     Yes Eliquis for a blood clot between my left arm and my heart which they put a stent into. 10. CAUSE: "What do you think is causing the bleeding?" (e.g., recent gyn surgery, recent gyn procedure; known bleeding disorder, cervical cancer, polycystic ovarian disease, fibroids)         The Eliquis I think. 11. HEMODYNAMIC STATUS: "Are you weak or feeling lightheaded?" If Yes, ask: "Can you stand and  walk normally?"        Not now.   I actually feel ok but al this blood has me scarred.    12. OTHER SYMPTOMS: "What other symptoms are you having with the bleeding?" (e.g., passed tissue, vaginal discharge, fever, menstrual-type cramps)       Filling a pad an hour at least.   "It's pouring out of me".  Protocols used: Vaginal Bleeding - Abnormal-A-AH

## 2022-04-23 NOTE — ED Notes (Signed)
Pt stated she was leaving.  

## 2022-04-23 NOTE — ED Provider Notes (Signed)
[facility] Provider Note   CSN: [id number] Arrival date & time: 04/23/22  1656     History  Chief Complaint  Patient presents with   Vaginal Bleeding    Connie Herrera is a 47 y.o. female.  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  Patient reports that she always has heavy periods that usually last about 3 days and she was due to start her menses.  It started last night and she has gone through she reports about 10 pads in the last 24 hours.  She reports the bleeding is heavy and there are not many clots.  She otherwise is feeling normal but she was concerned and wanted to make sure she was not losing too much blood.  She is having some mild abdominal cramps but reports that it is similar to all of her other periods.  She initially went to come but reported there were 25 people in front of her so she left after they drew blood.  The history is provided by the patient and medical records.  Vaginal Bleeding      Home Medications Prior to Admission medications   Medication Sig Start Date End Date Taking? Authorizing Provider  APIXABAN Arne Cleveland) VTE STARTER PACK ('10MG'$  AND '5MG'$ ) Take as directed on package: start with two-'5mg'$  tablets twice daily until 04/22/2021. On 04/23/22 switch to one-'5mg'$  tablet twice daily. 04/17/22   Antonieta Pert, MD  aspirin EC 81 MG tablet Take 1 tablet (81 mg total) by mouth daily at 6 (six) AM. Swallow whole. 04/17/22 05/17/22  Antonieta Pert, MD  baclofen (LIORESAL) 10 MG tablet Take 1 tablet (10 mg total) by mouth 3 (three) times daily. Patient not taking: Reported on 04/09/2022 02/25/22   Pattricia Boss, MD  docusate sodium (COLACE) 100 MG capsule Take 1 capsule (100 mg total) by mouth daily. 04/17/22   Antonieta Pert, MD   ferrous sulfate 325 (65 FE) MG EC tablet Take 1 tablet (325 mg total) by mouth 2 (two) times daily. Patient taking differently: Take 1 tablet by mouth daily with breakfast. 01/08/22   Benay Pike, MD  ondansetron (ZOFRAN) 4 MG tablet Take 1 tablet (4 mg total) by mouth every 8 (eight) hours as needed for nausea or vomiting. 04/17/22   Antonieta Pert, MD  pantoprazole (PROTONIX) 40 MG tablet Take 1 tablet (40 mg total) by mouth daily. 04/17/22 05/17/22  Antonieta Pert, MD  prochlorperazine (COMPAZINE) 10 MG tablet Take 1 tablet (10 mg total) by mouth every 8 (eight) hours as needed (nausea or headache). Patient not taking: Reported on 04/09/2022 10/23/21   Maudie Flakes, MD  rosuvastatin (CRESTOR) 10 MG tablet Take 1 tablet (10 mg total) by mouth daily. 04/17/22 05/17/22  Antonieta Pert, MD  senna-docusate (SENOKOT-S) 8.6-50 MG tablet Take 2 tablets by mouth 2 (two) times daily for 14 days. 04/17/22 05/01/22  Antonieta Pert, MD  traMADol (ULTRAM) 50 MG tablet Take 1 tablet (50 mg total) by mouth every 6 (six) hours as needed for up to 12 doses. 04/17/22   Antonieta Pert, MD  hydrochlorothiazide (HYDRODIURIL) 25 MG tablet Take 1 tablet (25 mg total) by mouth daily. Must have office visit for refills 06/20/20 08/13/20  Brunetta Jeans, PA-C  Allergies    Patient has no known allergies.    Review of Systems   Review of Systems  Genitourinary:  Positive for vaginal bleeding.    Physical Exam Updated Vital Signs BP (!) 140/96 (BP Location: Left Arm)   Pulse 77   Temp 98.7 F (37.1 C) (Oral)   Resp 17   Ht '5\' 6"'$  (1.676 m)   Wt 101.2 kg   LMP 03/25/2022 Comment: Neg Preg Test today on 04/10/22.  SpO2 100%   BMI 36.01 kg/m  Physical Exam Vitals and nursing note reviewed. Exam conducted with a chaperone present.  Constitutional:      General: She is not in acute distress. HENT:     Head: Normocephalic.  Eyes:     Pupils: Pupils are equal, round, and reactive to light.  Neck:     Comments: Healing surgical  wound on the right anterior neck Cardiovascular:     Rate and Rhythm: Normal rate.     Pulses: Normal pulses.  Genitourinary:    Comments: Blood present in the vaginal vault that is dark and some small clots.  No profuse bleeding at this time. Neurological:     Mental Status: She is alert and oriented to person, place, and time. Mental status is at baseline.  Psychiatric:        Mood and Affect: Mood normal.        Behavior: Behavior normal.     ED Results / Procedures / Treatments   Labs (all labs ordered are listed, but only abnormal results are displayed) Labs Reviewed - No data to display  EKG None  Radiology CT ANGIO UP EXTREM LEFT W &/OR WO CONTAST  Result Date: 04/22/2022 CLINICAL DATA:  Vascular murmur, left upper arm pain. Recent brachiocephalic thrombus on the right. EXAM: CT ANGIOGRAPHY OF THE LEFT UPPEREXTREMITY TECHNIQUE: Multidetector CT imaging of the left upperwas performed using the standard protocol during bolus administration of intravenous contrast. Multiplanar CT image reconstructions and MIPs were obtained to evaluate the vascular anatomy. RADIATION DOSE REDUCTION: This exam was performed according to the departmental dose-optimization program which includes automated exposure control, adjustment of the mA and/or kV according to patient size and/or use of iterative reconstruction technique. CONTRAST:  126m OMNIPAQUE IOHEXOL 350 MG/ML SOLN COMPARISON:  01/02/2021. FINDINGS: The visualized subclavian, axillary, brachial, radial, ulnar, and interosseous arteries are patent. No acute fracture or dislocation is seen. The musculature and right upper extremity soft tissues are within normal limits. Multiple nodular densities are noted in the left breast measuring up to 3.2 cm, previously characterized as cysts by ultrasound. Review of the MIP images confirms the above findings. IMPRESSION: Patent left upper extremity arterial vasculature. Electronically Signed   By: LBrett FairyM.D.   On: 04/22/2022 05:03   CT ANGIO HEAD NECK W WO CM  Result Date: 04/22/2022 CLINICAL DATA:  Left arm pain EXAM: CT ANGIOGRAPHY HEAD AND NECK TECHNIQUE: Multidetector CT imaging of the head and neck was performed using the standard protocol during bolus administration of intravenous contrast. Multiplanar CT image reconstructions and MIPs were obtained to evaluate the vascular anatomy. Carotid stenosis measurements (when applicable) are obtained utilizing NASCET criteria, using the distal internal carotid diameter as the denominator. RADIATION DOSE REDUCTION: This exam was performed according to the departmental dose-optimization program which includes automated exposure control, adjustment of the mA and/or kV according to patient size and/or use of iterative reconstruction technique. CONTRAST:  1357mOMNIPAQUE IOHEXOL 350 MG/ML SOLN COMPARISON:  04/09/2022 FINDINGS: CT  HEAD FINDINGS Brain: No evidence of acute infarction, hemorrhage, hydrocephalus, extra-axial collection or mass lesion/mass effect. Vascular: No hyperdense vessel or unexpected calcification. Skull: Normal. Negative for fracture or focal lesion. Sinuses/Orbits: Negative Review of the MIP images confirms the above findings CTA NECK FINDINGS Aortic arch: Normal. Brachiocephalic stent without residual thrombus or adjacent dissection seen. There is degradation overall mild image blurring from motion artifact. Right carotid system: Vessels are smoothly contoured and widely patent Left carotid system: Vessels are smoothly contoured and widely patent Vertebral arteries: No proximal subclavian or vertebral stenosis or beading. Skeleton: No acute finding Other neck: Swelling in the right lateral neck with a bubble of gas, there has been recent carotid cutdown for brachiocephalic stenting. Recent thyroid ultrasound. Upper chest: No acute finding Review of the MIP images confirms the above findings CTA HEAD FINDINGS Anterior circulation: No  branch occlusion, beading, aneurysm, or stenosis. Posterior circulation: No branch occlusion, beading, aneurysm, or stenosis. Venous sinuses: Diffusely patent Anatomic variants: None significant Review of the MIP images confirms the above findings IMPRESSION: Uncomplicated interval brachiocephalic stent. No new abnormality or explanation for left arm symptoms. Electronically Signed   By: Jorje Guild M.D.   On: 04/22/2022 05:00    Procedures Procedures    Medications Ordered in ED Medications - No data to display  ED Course/ Medical Decision Making/ A&P                             Medical Decision Making Amount and/or Complexity of Data Reviewed Labs:  Decision-making details documented in ED Course.   Pt with multiple medical problems and comorbidities and presenting today with a complaint that caries a high risk for morbidity and mortality.  Here today for heavy vaginal bleeding.  Patient reports she has heavy menses anyway but this is the first time she has had her menses while being on aspirin and Eliquis.  She reports she is gone through 10 pads in the last 24 hours.  Patient's vital signs are reassuring today.  She had labs done earlier which I independently interpreted and hemoglobin is unchanged at 9.8.  Patient is not having syncopal type symptoms and on her pelvic exam there is bleeding but it is not excessive.  Reassured patient and discussed expectations for menses in the future while being on blood thinners.  Gave return precautions.  Also gave information for OB/GYN follow-up as she does have known fibroids.  At this time she has minimal abdominal pain low suspicion for ovarian torsion or an acute infectious etiology.         Final Clinical Impression(s) / ED Diagnoses Final diagnoses:  Episode of heavy vaginal bleeding    Rx / DC Orders ED Discharge Orders     None         Blanchie Dessert, MD 04/23/22 1845

## 2022-04-23 NOTE — Telephone Encounter (Signed)
Will continue with plan discussed in office. Patient advised to return to the emergency department for worsening/severe pain and any new onset symptoms.

## 2022-04-23 NOTE — ED Triage Notes (Signed)
Patient here POV from Home.  Endorses Vaginal Bleeding since Last PM. Associated with Menstrual Cycle but is concerned as Bleeding has been severe. No Clots. Does take Eliquis. Normal Cramping.   Seen today at another ED but LWBS today. Had Specimens collected then.   NAD Noted during Triage. A&Ox4. GCS 15. Ambulatory.

## 2022-04-30 LAB — CULTURE, FUNGUS WITHOUT SMEAR

## 2022-05-07 ENCOUNTER — Telehealth: Payer: BLUE CROSS/BLUE SHIELD | Admitting: Nurse Practitioner

## 2022-05-07 NOTE — Telephone Encounter (Signed)
Patient denied counseling services at this time

## 2022-05-07 NOTE — Telephone Encounter (Signed)
Copied from [place] [id number]. Topic: General - Inquiry >> May 01, 2022  1:08 PM Rosanne Ashing P wrote: Reason for CRM: pt called saying when she was in the hospital they were going to do a procedure on her to see why she is having blood clots.  She called it a TEE.  She wants to know who is she supposed to see to have this done.  CB#  [phone]  Spoke to pt states that it was a test that the neurologist advised her of when she was in the hospital, she states that she will f/u w/the neurologist she was referred to on 06/02/22 for more information.

## 2022-05-12 ENCOUNTER — Emergency Department (HOSPITAL_BASED_OUTPATIENT_CLINIC_OR_DEPARTMENT_OTHER)
Admission: EM | Admit: 2022-05-12 | Discharge: 2022-05-12 | Disposition: A | Discharge status: Home / Self Care | Payer: BLUE CROSS/BLUE SHIELD | Attending: Emergency Medicine | Admitting: Emergency Medicine

## 2022-05-12 ENCOUNTER — Encounter (HOSPITAL_BASED_OUTPATIENT_CLINIC_OR_DEPARTMENT_OTHER): Payer: Self-pay

## 2022-05-12 ENCOUNTER — Other Ambulatory Visit: Payer: Self-pay

## 2022-05-12 ENCOUNTER — Ambulatory Visit: Payer: Self-pay | Admitting: *Deleted

## 2022-05-12 ENCOUNTER — Telehealth: Payer: Self-pay

## 2022-05-12 ENCOUNTER — Ambulatory Visit: Payer: Self-pay

## 2022-05-12 ENCOUNTER — Emergency Department (HOSPITAL_BASED_OUTPATIENT_CLINIC_OR_DEPARTMENT_OTHER): Payer: BLUE CROSS/BLUE SHIELD

## 2022-05-12 ENCOUNTER — Other Ambulatory Visit (HOSPITAL_COMMUNITY): Payer: Self-pay

## 2022-05-12 DIAGNOSIS — I493 Ventricular premature depolarization: Secondary | ICD-10-CM | POA: Diagnosis not present

## 2022-05-12 DIAGNOSIS — I10 Essential (primary) hypertension: Secondary | ICD-10-CM | POA: Insufficient documentation

## 2022-05-12 DIAGNOSIS — Z7982 Long term (current) use of aspirin: Secondary | ICD-10-CM | POA: Insufficient documentation

## 2022-05-12 DIAGNOSIS — Z79899 Other long term (current) drug therapy: Secondary | ICD-10-CM | POA: Insufficient documentation

## 2022-05-12 DIAGNOSIS — R5383 Other fatigue: Secondary | ICD-10-CM | POA: Insufficient documentation

## 2022-05-12 DIAGNOSIS — Z1152 Encounter for screening for COVID-19: Secondary | ICD-10-CM | POA: Insufficient documentation

## 2022-05-12 DIAGNOSIS — R002 Palpitations: Secondary | ICD-10-CM | POA: Diagnosis present

## 2022-05-12 DIAGNOSIS — Z8616 Personal history of COVID-19: Secondary | ICD-10-CM | POA: Diagnosis not present

## 2022-05-12 LAB — CBC WITH DIFFERENTIAL/PLATELET
Abs Immature Granulocytes: 0.02 10*3/uL (ref 0.00–0.07)
Basophils Absolute: 0 10*3/uL (ref 0.0–0.1)
Basophils Relative: 1 %
Eosinophils Absolute: 0.1 10*3/uL (ref 0.0–0.5)
Eosinophils Relative: 1 %
HCT: 34.2 % — ABNORMAL LOW (ref 36.0–46.0)
Hemoglobin: 10.6 g/dL — ABNORMAL LOW (ref 12.0–15.0)
Immature Granulocytes: 0 %
Lymphocytes Relative: 31 %
Lymphs Abs: 1.9 10*3/uL (ref 0.7–4.0)
MCH: 23.8 pg — ABNORMAL LOW (ref 26.0–34.0)
MCHC: 31 g/dL (ref 30.0–36.0)
MCV: 76.7 fL — ABNORMAL LOW (ref 80.0–100.0)
Monocytes Absolute: 0.5 10*3/uL (ref 0.1–1.0)
Monocytes Relative: 9 %
Neutro Abs: 3.5 10*3/uL (ref 1.7–7.7)
Neutrophils Relative %: 58 %
Platelets: 408 10*3/uL — ABNORMAL HIGH (ref 150–400)
RBC: 4.46 MIL/uL (ref 3.87–5.11)
RDW: 19.6 % — ABNORMAL HIGH (ref 11.5–15.5)
WBC: 6 10*3/uL (ref 4.0–10.5)
nRBC: 0 % (ref 0.0–0.2)

## 2022-05-12 LAB — TROPONIN I (HIGH SENSITIVITY)
Troponin I (High Sensitivity): 3 ng/L (ref <18)
Troponin I (High Sensitivity): 3 ng/L (ref <18)

## 2022-05-12 LAB — BASIC METABOLIC PANEL
Anion gap: 8 (ref 5–15)
BUN: 9 mg/dL (ref 6–20)
CO2: 26 mmol/L (ref 22–32)
Calcium: 9.2 mg/dL (ref 8.9–10.3)
Chloride: 102 mmol/L (ref 98–111)
Creatinine, Ser: 0.75 mg/dL (ref 0.44–1.00)
GFR, Estimated: >60 mL/min (ref >60)
Glucose, Bld: 99 mg/dL (ref 70–99)
Potassium: 3.5 mmol/L (ref 3.5–5.1)
Sodium: 136 mmol/L (ref 135–145)

## 2022-05-12 LAB — RESP PANEL BY RT-PCR (RSV, FLU A&B, COVID)  RVPGX2
Influenza A by PCR: NEGATIVE
Influenza B by PCR: NEGATIVE
Resp Syncytial Virus by PCR: NEGATIVE
SARS Coronavirus 2 by RT PCR: NEGATIVE

## 2022-05-12 MED ORDER — LACTATED RINGERS IV BOLUS
1000.0000 mL | Freq: Once | INTRAVENOUS | Status: AC
  Administered 2022-05-12: 1000 mL via INTRAVENOUS

## 2022-05-12 NOTE — ED Triage Notes (Signed)
Patient here POV from Home.  Endorses palpitations that began when the patient awoke at 0900. SOB came afterwards.  No pain.   NAD noted during Triage. A&Ox4. GCS 15. Ambulatory.

## 2022-05-12 NOTE — Discharge Instructions (Addendum)
Workup today was overall reassuring.  No concerning findings.  Heart enzyme, EKG everything is reassuring against a concerning cause of your symptoms.  Your EKG and heart monitor did show evidence of PVCs which is a benign rhythm.  Caffeine could be a trigger for this.  Cut back on your caffeine.  Drink plenty of fluids.  For any concerning symptoms return to the emergency department otherwise please call your cardiologist.

## 2022-05-12 NOTE — ED Notes (Signed)
Pt states she is not being allowed to take her bp medication (amlodipine) due to taking eliquis.

## 2022-05-12 NOTE — Telephone Encounter (Signed)
  Chief Complaint: SOB Symptoms: SOB at rest, having heart flutters, bilat arm pain, woozy feeling Frequency: started today  Pertinent Negatives: NA Disposition: '[x]'$ ED /'[]'$ Urgent Care (no appt availability in office) / '[]'$ Appointment(In office/virtual)/ '[]'$  [facility]/ '[]'$ Home Care/ '[]'$ Refused Recommended Disposition /'[]'$ [facility]/ '[]'$  Follow-up with PCP Additional Notes: pt states that pain in her arms is back and was told that blood clot can come back in arm as well even tho she had stent put in. I advised pt to go to ED and be checked out. Pt was asking about having TEE done that was scheduled for 1/15/4 but cancelled and never rescheduled. I seen in chart about TEE but no information on an office to contact, reached out to [place], [facility] who advised she would try to contact Endo to see if they were able to assist and call pt back. I advised her of pt sx and that I had recommended she go to ED and she agreed that pt can FU with TEE there since was initially set up in hospital. Pt advised of this information and states she will go to [facility].   Reason for Disposition  [1] MODERATE difficulty breathing (e.g., speaks in phrases, SOB even at rest, pulse 100-120) AND [2] NEW-onset or WORSE than normal  Answer Assessment - Initial Assessment Questions 1. RESPIRATORY STATUS: "Describe your breathing?" (e.g., wheezing, shortness of breath, unable to speak, severe coughing)      SOB 2. ONSET: "When did this breathing problem begin?"      Today  3. PATTERN "Does the difficult breathing come and go, or has it been constant since it started?"      constant 4. SEVERITY: "How bad is your breathing?" (e.g., mild, moderate, severe)    - MILD: No SOB at rest, mild SOB with walking, speaks normally in sentences, can lie down, no retractions, pulse < 100.    - MODERATE: SOB at rest, SOB with minimal exertion and prefers to sit, cannot lie down flat, speaks in phrases, mild retractions, audible  wheezing, pulse 100-120.    - SEVERE: Very SOB at rest, speaks in single words, struggling to breathe, sitting hunched forward, retractions, pulse > 120      SOB with rest  6. CARDIAC HISTORY: "Do you have any history of heart disease?" (e.g., heart attack, angina, bypass surgery, angioplasty)      Stent for blood clots  9. OTHER SYMPTOMS: "Do you have any other symptoms? (e.g., dizziness, runny nose, cough, chest pain, fever)     Woozy today, Bilat arm pain, heart fluttering  Protocols used: Breathing Difficulty-A-AH

## 2022-05-12 NOTE — Telephone Encounter (Signed)
Patient calling from ED and reports ED MD recommended her to contact PCP for cardiology referral. Please advise. Patient is crying and upset and requesting referral..       Reason for Disposition  Patient already left for the hospital/clinic.  Answer Assessment - Initial Assessment Questions 1. REASON FOR CALL or QUESTION: "What is your reason for calling today?" or "How can I best help you?" or "What question do you have that I can help answer?"     Patient in ED now and requesting PCP refer to a cardiologist.  Answer Assessment - Initial Assessment Questions N/A Previous triage completed and patient advised ED. Patient in ED now and advised to contact PCP for cardiology referral.  Protocols used: Information Only Call - No Triage-A-AH, No Contact or Duplicate Contact Call-A-AH

## 2022-05-12 NOTE — Telephone Encounter (Signed)
Attempted to return pt's call left on triage VM and both times the call went straight to VM. Unable to leave message, as VM has "not been set up".

## 2022-05-12 NOTE — ED Notes (Signed)
Pt discharged home after verbalizing understanding of discharge instructions; nad noted. 

## 2022-05-12 NOTE — ED Provider Notes (Addendum)
[facility] Provider Note   CSN: [id number] Arrival date & time: 05/12/22  1446     History  Chief Complaint  Patient presents with   Shortness of Breath    Connie Herrera is a 47 y.o. female.  47 year old female with recent acute CVA, arterial thrombosis status post stent placement presents today for evaluation of "not feeling well".  She states she woke up this morning with palpitations.  Later on developed some shortness of breath.  Also endorses some fatigue.  Denies any chest pain however states her left arm just does not feel right.  Denies history of palpitations no prior history of A-fib.  However she is on anticoagulation for prior CVA due to unknown etiology of her clots which she is undergoing workup for.  She states she has a TEE upcoming in the near future.  Recent echo performed which showed normal EF with no WMA.  She does endorse drinking caffeine about couple 8 ounce cups every morning.  The history is provided by the patient and medical records. No language interpreter was used.       Home Medications Prior to Admission medications   Medication Sig Start Date End Date Taking? Authorizing Provider  APIXABAN Arne Cleveland) VTE STARTER PACK ('10MG'$  AND '5MG'$ ) Take as directed on package: start with two-'5mg'$  tablets twice daily until 04/22/2021. On 04/23/22 switch to one-'5mg'$  tablet twice daily. 04/17/22   Antonieta Pert, MD  aspirin EC 81 MG tablet Take 1 tablet (81 mg total) by mouth daily at 6 (six) AM. Swallow whole. 04/17/22 05/17/22  Antonieta Pert, MD  baclofen (LIORESAL) 10 MG tablet Take 1 tablet (10 mg total) by mouth 3 (three) times daily. Patient not taking: Reported on 04/09/2022 02/25/22   Pattricia Boss, MD  docusate sodium (COLACE) 100 MG capsule Take 1 capsule (100 mg total) by mouth daily. 04/17/22   Antonieta Pert, MD  ferrous sulfate 325 (65 FE) MG EC tablet Take 1 tablet (325 mg total) by mouth 2 (two) times daily. Patient taking  differently: Take 1 tablet by mouth daily with breakfast. 01/08/22   Benay Pike, MD  ondansetron (ZOFRAN) 4 MG tablet Take 1 tablet (4 mg total) by mouth every 8 (eight) hours as needed for nausea or vomiting. 04/17/22   Antonieta Pert, MD  pantoprazole (PROTONIX) 40 MG tablet Take 1 tablet (40 mg total) by mouth daily. 04/17/22 05/17/22  Antonieta Pert, MD  prochlorperazine (COMPAZINE) 10 MG tablet Take 1 tablet (10 mg total) by mouth every 8 (eight) hours as needed (nausea or headache). Patient not taking: Reported on 04/09/2022 10/23/21   Maudie Flakes, MD  rosuvastatin (CRESTOR) 10 MG tablet Take 1 tablet (10 mg total) by mouth daily. 04/17/22 05/17/22  Antonieta Pert, MD  traMADol (ULTRAM) 50 MG tablet Take 1 tablet (50 mg total) by mouth every 6 (six) hours as needed for up to 12 doses. 04/17/22   Antonieta Pert, MD  hydrochlorothiazide (HYDRODIURIL) 25 MG tablet Take 1 tablet (25 mg total) by mouth daily. Must have office visit for refills 06/20/20 08/13/20  Brunetta Jeans, PA-C      Allergies    Patient has no known allergies.    Review of Systems   Review of Systems  Constitutional:  Negative for diaphoresis and fever.  Respiratory:  Positive for shortness of breath. Negative for cough.   Cardiovascular:  Positive for palpitations. Negative for chest pain and leg swelling.  Gastrointestinal:  Negative for abdominal pain  and nausea.  Neurological:  Negative for light-headedness.  All other systems reviewed and are negative.   Physical Exam Updated Vital Signs BP (!) 174/105 (BP Location: Right Arm)   Pulse 75   Temp 98.7 F (37.1 C) (Oral)   Resp 20   Ht '5\' 6"'$  (1.676 m)   Wt 101.2 kg   LMP 03/25/2022 Comment: Neg Preg Test today on 04/10/22.  SpO2 100%   BMI 36.01 kg/m  Physical Exam Vitals and nursing note reviewed.  Constitutional:      General: She is not in acute distress.    Appearance: Normal appearance. She is obese. She is not ill-appearing.  HENT:     Head: Normocephalic and  atraumatic.     Nose: Nose normal.  Eyes:     General: No scleral icterus.    Extraocular Movements: Extraocular movements intact.     Conjunctiva/sclera: Conjunctivae normal.  Cardiovascular:     Rate and Rhythm: Normal rate and regular rhythm.     Pulses: Normal pulses.  Pulmonary:     Effort: Pulmonary effort is normal. No respiratory distress.     Breath sounds: Normal breath sounds. No wheezing or rales.  Abdominal:     General: There is no distension.     Tenderness: There is no abdominal tenderness.  Musculoskeletal:        General: Normal range of motion.     Cervical back: Normal range of motion.  Skin:    General: Skin is warm and dry.  Neurological:     General: No focal deficit present.     Mental Status: She is alert. Mental status is at baseline.     ED Results / Procedures / Treatments   Labs (all labs ordered are listed, but only abnormal results are displayed) Labs Reviewed  RESP PANEL BY RT-PCR (RSV, FLU A&B, COVID)  RVPGX2  CBC WITH DIFFERENTIAL/PLATELET  BASIC METABOLIC PANEL  TROPONIN I (HIGH SENSITIVITY)    EKG None  Radiology No results found.  Procedures Procedures    Medications Ordered in ED Medications - No data to display  ED Course/ Medical Decision Making/ A&P                             Medical Decision Making Amount and/or Complexity of Data Reviewed Labs: ordered.   Medical Decision Making / ED Course   This patient presents to the ED for concern of palpitations, this involves an extensive number of treatment options, and is a complaint that carries with it a high risk of complications and morbidity.  The differential diagnosis includes A-fib, PVCs, ACS, other arrhythmia  MDM: 47 year old female presents today for evaluation of above-mentioned complaints.  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  She states she has been doing relatively well until today when she noticed mild shortness of breath,  fatigue.  Overall she is well-appearing.  No acute distress.  Workup revealed CBC without leukocytosis, hemoglobin at 10.6 which is around her baseline.  BMP which is unremarkable.  Respiratory panel negative.  Chest x-ray without acute cardiopulmonary process.  Troponin of 3.  Which is similar to what it was a couple weeks ago.  EKG without acute ischemic changes but does show multiple PVCs.  Patient was maintained on telemetry which showed occasional PVCs.  Low suspicion for ACS.  Palpitations likely due to PVCs.  Patient does drink a lot of caffeine.  This could be her trigger.  We discussed cutting back on caffeine.  Discussed following up with her cardiologist.  We discussed repeat troponin however after shared decision making patient defers this.  Patient is appropriate for discharge.  Discharged in stable condition.  Return precaution discussed.  Patient voices understanding and is in agreement with plan.  Patient did have recent TSH at the admission 1 month ago which was normal.  Patient after being discharged requested to get her second troponin and a liter of fluids.  Second troponin was obtained which is normal without any increase.  Liter of fluid provided.  Patient is stable for discharge.  Discussed follow-up with cardiology.  Lab Tests: -I ordered, reviewed, and interpreted labs.   The pertinent results include:   Labs Reviewed  CBC WITH DIFFERENTIAL/PLATELET - Abnormal; Notable for the following components:      Result Value   Hemoglobin 10.6 (*)    HCT 34.2 (*)    MCV 76.7 (*)    MCH 23.8 (*)    RDW 19.6 (*)    Platelets 408 (*)    All other components within normal limits  RESP PANEL BY RT-PCR (RSV, FLU A&B, COVID)  RVPGX2  BASIC METABOLIC PANEL  TROPONIN I (HIGH SENSITIVITY)      EKG  EKG Interpretation  Date/Time:    Ventricular Rate:    PR Interval:    QRS Duration:   QT Interval:    QTC Calculation:   R Axis:     Text Interpretation:           Imaging  Studies ordered: I ordered imaging studies including cxr I independently visualized and interpreted imaging. I agree with the radiologist interpretation   Medicines ordered and prescription drug management: No orders of the defined types were placed in this encounter.   -I have reviewed the patients home medicines and have made adjustments as needed   Cardiac Monitoring: The patient was maintained on a cardiac monitor.  I personally viewed and interpreted the cardiac monitored which showed an underlying rhythm of: Normal sinus rhythm with occasional PVCs   Co morbidities that complicate the patient evaluation  Past Medical History:  Diagnosis Date   Brain lesion    COVID-19 04/2020   Diplopia    Hypertension    Iron deficiency anemia       Dispostion: Patient is appropriate for discharge.  Discharged in stable condition.  Return precaution discussed.  Admission considered however no concerning findings on workup.  No concerning arrhythmia on telemetry.  Final Clinical Impression(s) / ED Diagnoses Final diagnoses:  Palpitations  Fatigue, unspecified type  PVC's (premature ventricular contractions)    Rx / DC Orders ED Discharge Orders     None         Evlyn Courier, PA-C 05/12/22 1642    Evlyn Courier, PA-C 05/12/22 1744    Regan Lemming, MD 05/12/22 2040
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
## 2022-05-12 NOTE — Telephone Encounter (Signed)
Pt would need to seek medical attention at emergency dept for further evaluation

## 2022-05-13 ENCOUNTER — Telehealth: Payer: Self-pay | Admitting: Family

## 2022-05-13 ENCOUNTER — Ambulatory Visit: Payer: Self-pay

## 2022-05-13 ENCOUNTER — Telehealth: Payer: Self-pay

## 2022-05-13 ENCOUNTER — Ambulatory Visit: Payer: Self-pay | Admitting: *Deleted

## 2022-05-13 ENCOUNTER — Other Ambulatory Visit: Payer: Self-pay | Admitting: Family

## 2022-05-13 DIAGNOSIS — I493 Ventricular premature depolarization: Secondary | ICD-10-CM

## 2022-05-13 DIAGNOSIS — R002 Palpitations: Secondary | ICD-10-CM

## 2022-05-13 NOTE — Telephone Encounter (Signed)
Urgent referral for Cardiology has been completed by provider, Please allow 1-2 weeks for their office to contact pt.

## 2022-05-13 NOTE — Telephone Encounter (Signed)
  Chief Complaint: pt upset and crying that cardiology referral has not been made and upset that the TEE was canceled and she does not know why.  Disposition: '[]'$ ED /'[]'$ Urgent Care (no appt availability in office) / '[]'$ Appointment(In office/virtual)/ '[]'$  [facility]/ '[]'$ Home Care/ '[]'$ Refused Recommended Disposition /'[]'$  Mobile Bus/ '[x]'$  Follow-up with PCP Additional Notes: Teams message sent to Evelena Asa at practice Reason for Disposition  Requesting regular office appointment    Cardiology referral  Answer Assessment - Initial Assessment Questions 1. REASON FOR CALL or QUESTION: "What is your reason for calling today?" or "How can I best help you?" or "What question do you have that I can help answer?"     Pt was advised in ED that she needs a cardiology referral ASAP and also asked why the TEE was canceled on 04/20/22.  Protocols used: Information Only Call - No Triage-A-AH

## 2022-05-13 NOTE — Telephone Encounter (Signed)
Make patient aware referral to Cardiology complete. Expect call soon from their office with appointment details.

## 2022-05-13 NOTE — Telephone Encounter (Signed)
Spoke with patient . Patient informed cardiologist  referral has been made and sent to the person that manages referrals.

## 2022-05-13 NOTE — Telephone Encounter (Signed)
Patient returned call back about referral to cardiology.

## 2022-05-13 NOTE — Telephone Encounter (Signed)
Opened chart to answer a question for agent.   Pt. Calling about a cardiology referral.   A message has been sent to [facility] at [facility] by another triage nurse this morning.   Waiting on a reply from them. Had agent transfer call into the practice regarding the cardiology referral since she is calling back.

## 2022-05-13 NOTE — Telephone Encounter (Signed)
Got in touch with Cassandra and transferred pt to her. Rerouting to [facility].

## 2022-05-14 ENCOUNTER — Inpatient Hospital Stay: Payer: BLUE CROSS/BLUE SHIELD | Attending: Hematology and Oncology | Admitting: Hematology and Oncology

## 2022-05-14 ENCOUNTER — Inpatient Hospital Stay: Payer: BLUE CROSS/BLUE SHIELD

## 2022-05-14 ENCOUNTER — Other Ambulatory Visit: Payer: Self-pay

## 2022-05-14 ENCOUNTER — Telehealth: Payer: Self-pay | Admitting: *Deleted

## 2022-05-14 ENCOUNTER — Encounter: Payer: Self-pay | Admitting: Hematology and Oncology

## 2022-05-14 VITALS — BP 150/96 | HR 103 | Temp 97.6°F | Resp 16 | Ht 66.0 in | Wt 228.9 lb

## 2022-05-14 DIAGNOSIS — Z7982 Long term (current) use of aspirin: Secondary | ICD-10-CM | POA: Diagnosis not present

## 2022-05-14 DIAGNOSIS — Z79899 Other long term (current) drug therapy: Secondary | ICD-10-CM | POA: Diagnosis not present

## 2022-05-14 DIAGNOSIS — I10 Essential (primary) hypertension: Secondary | ICD-10-CM | POA: Diagnosis not present

## 2022-05-14 DIAGNOSIS — Z8616 Personal history of COVID-19: Secondary | ICD-10-CM | POA: Insufficient documentation

## 2022-05-14 DIAGNOSIS — I749 Embolism and thrombosis of unspecified artery: Secondary | ICD-10-CM | POA: Diagnosis not present

## 2022-05-14 DIAGNOSIS — G939 Disorder of brain, unspecified: Secondary | ICD-10-CM | POA: Diagnosis not present

## 2022-05-14 DIAGNOSIS — Z87891 Personal history of nicotine dependence: Secondary | ICD-10-CM | POA: Insufficient documentation

## 2022-05-14 DIAGNOSIS — D509 Iron deficiency anemia, unspecified: Secondary | ICD-10-CM | POA: Insufficient documentation

## 2022-05-14 DIAGNOSIS — Z7901 Long term (current) use of anticoagulants: Secondary | ICD-10-CM | POA: Diagnosis not present

## 2022-05-14 DIAGNOSIS — D6859 Other primary thrombophilia: Secondary | ICD-10-CM

## 2022-05-14 LAB — CBC WITH DIFFERENTIAL/PLATELET
Abs Immature Granulocytes: 0.01 10*3/uL (ref 0.00–0.07)
Basophils Absolute: 0 10*3/uL (ref 0.0–0.1)
Basophils Relative: 0 %
Eosinophils Absolute: 0 10*3/uL (ref 0.0–0.5)
Eosinophils Relative: 1 %
HCT: 34 % — ABNORMAL LOW (ref 36.0–46.0)
Hemoglobin: 10.9 g/dL — ABNORMAL LOW (ref 12.0–15.0)
Immature Granulocytes: 0 %
Lymphocytes Relative: 21 %
Lymphs Abs: 1.4 10*3/uL (ref 0.7–4.0)
MCH: 24.4 pg — ABNORMAL LOW (ref 26.0–34.0)
MCHC: 32.1 g/dL (ref 30.0–36.0)
MCV: 76.2 fL — ABNORMAL LOW (ref 80.0–100.0)
Monocytes Absolute: 0.5 10*3/uL (ref 0.1–1.0)
Monocytes Relative: 7 %
Neutro Abs: 4.8 10*3/uL (ref 1.7–7.7)
Neutrophils Relative %: 71 %
Platelets: 378 10*3/uL (ref 150–400)
RBC: 4.46 MIL/uL (ref 3.87–5.11)
RDW: 19 % — ABNORMAL HIGH (ref 11.5–15.5)
WBC: 6.7 10*3/uL (ref 4.0–10.5)
nRBC: 0 % (ref 0.0–0.2)

## 2022-05-14 LAB — IRON AND IRON BINDING CAPACITY (CC-WL,HP ONLY)
Iron: 27 ug/dL — ABNORMAL LOW (ref 28–170)
Saturation Ratios: 6 % — ABNORMAL LOW (ref 10.4–31.8)
TIBC: 435 ug/dL (ref 250–450)
UIBC: 408 ug/dL (ref 148–442)

## 2022-05-14 LAB — FERRITIN: Ferritin: 18 ng/mL (ref 11–307)

## 2022-05-14 NOTE — Telephone Encounter (Signed)
Patient called and said her blood pressure was very high. She states Dr Virl Cagey had told her to stop her BP med because it could cause her to be hypotensive. Called Dr Virl Cagey to verify he states she must have misunderstood. She should be taking all her medications. Instructed patient to resume amlodipine and continue eliquis and all her current medications. Patient instructed to follow up with her PCP if her BP continues to be elevated after resuming her medications. Patient verbalized understanding.

## 2022-05-14 NOTE — Progress Notes (Signed)
[place] CONSULT NOTE  Patient Care Team: Camillia Herter, NP as PCP - General (Nurse Practitioner)  CHIEF COMPLAINTS/PURPOSE OF CONSULTATION:  IDA, arterial thrombosis  ASSESSMENT & PLAN:   This is a pleasant 47 year old African-American female patient with past medical history significant for hypertension referred to hematology for recommendations regarding iron deficiency anemia.   Since her last visit here, she had multiple ED visits, she initially went to the hospital with left arm weakness.  She underwent MRI brain which showed multiple small areas of diffusion hyperintense signal in the occipital and parietal lobes bilaterally.  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  She since then had multiple other ER visits.  She is currently on Eliquis for anticoagulation.  She continues on oral iron once a day as recommended. Physical examination today without any major concerns.  With regards to hypercoagulable workup, most of the hypercoagulable workup done was reviewed and negative.  I have added PNH panel and JAK2 since this is an arterial clot.  With regards to iron deficiency anemia have ordered a repeat CBC, iron panel and ferritin for follow-up. She will return to clinic in 4 weeks or sooner as needed.  With regards to questions about antihypertensive medications, have encouraged her to call her doctor PCP versus vascular surgery about some drug clarifications.  She expressed understanding.  HISTORY OF PRESENTING ILLNESS:  JOZLYN SCHATZ 47 y.o. 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  She had  hypercoagulable workup which was unremarkable and she is currently on Eliquis.  She has been back-and-forth to the ER with several other complaints and now follows up with vascular surgery, cardiology for further recommendations.  With regards to her iron deficiency, she received some IV iron in the hospital and she is currently continuing on oral iron replacement.  She tolerates this well.  She denies any family history of clotting disorders.  Rest of the pertinent 10 point ROS reviewed and negative    REVIEW OF SYSTEMS:   Constitutional: Denies fevers, chills or abnormal night sweats Eyes: Denies blurriness of vision, double vision or watery eyes Ears, nose, mouth, throat, and face: Denies mucositis or sore throat Respiratory: Denies cough, dyspnea or wheezes Cardiovascular: Denies palpitation, chest discomfort or lower extremity swelling Gastrointestinal:  Denies nausea, heartburn or change in bowel habits Skin: Denies abnormal skin rashes Lymphatics: Denies new lymphadenopathy or easy bruising Neurological:Denies numbness, tingling or new weaknesses Behavioral/Psych: Mood is stable, no new changes  All other systems were reviewed with the patient and are negative.  MEDICAL HISTORY:  Past Medical History:  Diagnosis Date   Brain lesion    COVID-19 04/2020   Diplopia    Hypertension    Iron deficiency anemia     SURGICAL HISTORY: Past Surgical History:  Procedure Laterality Date   AORTOGRAM N/A 04/14/2022   Procedure: DIAGNOSTIC AORTOGRAM;  Surgeon: Broadus John, MD;  Location: [facility] OR;  Service: Vascular;  Laterality: N/A;   ENDARTERECTOMY Right 04/14/2022   Procedure: CAROTID CUTDOWN WITH BRACHIOCEPHALIC STENTING, WITH ULTRASOUND OF THE RIGHT FEMORAL VEIN;  Surgeon: Broadus John, MD;  Location: [place];  Service: Vascular;  Laterality:  Right;   TUBAL LIGATION      SOCIAL HISTORY: Social History   Socioeconomic History   Marital status: Single    Spouse name: Not on file    Number of children: 4   Years of education: 14   Highest education level: Some college, no degree  Occupational History   Occupation: Dietary Supervisor    Comment: [employer]  Tobacco Use   Smoking status: Former    Packs/day: 0.50    Years: 4.00    Total pack years: 2.00    Types: Cigarettes    Quit date: 06/04/2017    Years since quitting: 4.9   Smokeless tobacco: Never  Vaping Use   Vaping Use: Never used  Substance and Sexual Activity   Alcohol use: No   Drug use: Not Currently    Types: Marijuana    Comment: 09/30/20 maybe once a week   Sexual activity: Yes    Birth control/protection: Surgical  Other Topics Concern   Not on file  Social History Narrative   Patient lives at home with children.    Patient has 4 children.    Patient is single.    Patient has 2 years of college.    Patient is right handed.    Patient is working full time. [employer].    Social Determinants of Health   Financial Resource Strain: Not on file  Food Insecurity: No Food Insecurity (04/10/2022)   Hunger Vital Sign    Worried About Running Out of Food in the Last Year: Never true    Ran Out of Food in the Last Year: Never true  Transportation Needs: No Transportation Needs (04/10/2022)   PRAPARE - Hydrologist (Medical): No    Lack of Transportation (Non-Medical): No  Physical Activity: Not on file  Stress: Not on file  Social Connections: Not on file  Intimate Partner Violence: Not At Risk (04/10/2022)   Humiliation, Afraid, Rape, and Kick questionnaire    Fear of Current or Ex-Partner: No    Emotionally Abused: No    Physically Abused: No    Sexually Abused: No    FAMILY HISTORY: Family History  Problem Relation Age of Onset   Healthy Mother    Cirrhosis Father    Cancer Maternal Grandmother 26       breast   Breast cancer Maternal Grandmother     ALLERGIES:  has No Known Allergies.  MEDICATIONS:  Current Outpatient Medications   Medication Sig Dispense Refill   APIXABAN (ELIQUIS) VTE STARTER PACK ('10MG'$  AND '5MG'$ ) Take as directed on package: start with two-'5mg'$  tablets twice daily until 04/22/2021. On 04/23/22 switch to one-'5mg'$  tablet twice daily. 74 each 0   aspirin EC 81 MG tablet Take 1 tablet (81 mg total) by mouth daily at 6 (six) AM. Swallow whole. 30 tablet 0   baclofen (LIORESAL) 10 MG tablet Take 1 tablet (10 mg total) by mouth 3 (three) times daily. (Patient not taking: Reported on 04/09/2022) 30 each 0   docusate sodium (COLACE) 100 MG capsule Take 1 capsule (100 mg total) by mouth daily. 10 capsule 0   ferrous sulfate 325 (65 FE) MG EC tablet Take 1 tablet (325 mg total) by mouth 2 (two) times daily. (Patient taking differently: Take 1 tablet by mouth daily with breakfast.) 60 tablet 3   ondansetron (ZOFRAN) 4 MG tablet Take 1 tablet (4 mg total) by mouth every 8 (eight) hours as needed for nausea or  vomiting. 20 tablet 0   pantoprazole (PROTONIX) 40 MG tablet Take 1 tablet (40 mg total) by mouth daily. 30 tablet 0   prochlorperazine (COMPAZINE) 10 MG tablet Take 1 tablet (10 mg total) by mouth every 8 (eight) hours as needed (nausea or headache). (Patient not taking: Reported on 04/09/2022) 20 tablet 0   rosuvastatin (CRESTOR) 10 MG tablet Take 1 tablet (10 mg total) by mouth daily. 30 tablet 0   traMADol (ULTRAM) 50 MG tablet Take 1 tablet (50 mg total) by mouth every 6 (six) hours as needed for up to 12 doses. 12 tablet 0   No current facility-administered medications for this visit.     PHYSICAL EXAMINATION: ECOG PERFORMANCE STATUS: 0 - Asymptomatic  Vitals:   05/14/22 1128  BP: (!) 150/96  Pulse: (!) 103  Resp: 16  Temp: 97.6 F (36.4 C)  SpO2: 100%    Filed Weights   05/14/22 1128  Weight: 228 lb 14.4 oz (103.8 kg)     GENERAL:alert, no distress and comfortable SKIN: skin color, texture, turgor are normal, no rashes or significant lesions EYES: normal, conjunctiva are pink and non-injected,  sclera clear NECK: supple, thyroid normal size, non-tender, without nodularity LYMPH:  no palpable lymphadenopathy in the cervical,  LUNGS: clear to auscultation and percussion with normal breathing effort HEART: regular rate & rhythm and no murmurs and no lower extremity edema ABDOMEN:abdomen soft, non-tender and normal bowel sounds Musculoskeletal:no cyanosis of digits and no clubbing  PSYCH: alert & oriented x 3 with fluent speech NEURO: no focal motor/sensory deficits Extremities: All 4 extremities appear well-perfused.  LABORATORY DATA:  I have reviewed the data as listed Lab Results  Component Value Date   WBC 6.7 05/14/2022   HGB 10.9 (L) 05/14/2022   HCT 34.0 (L) 05/14/2022   MCV 76.2 (L) 05/14/2022   PLT 378 05/14/2022     Chemistry      Component Value Date/Time   NA 136 05/12/2022 1523   NA 140 01/09/2020 1351   K 3.5 05/12/2022 1523   CL 102 05/12/2022 1523   CO2 26 05/12/2022 1523   BUN 9 05/12/2022 1523   BUN 9 01/09/2020 1351   CREATININE 0.75 05/12/2022 1523      Component Value Date/Time   CALCIUM 9.2 05/12/2022 1523   ALKPHOS 67 04/23/2022 1526   AST 14 (L) 04/23/2022 1526   ALT 20 04/23/2022 1526   BILITOT 0.2 (L) 04/23/2022 1526   BILITOT <0.2 01/09/2020 1351     Most recent labs with iron saturation of 4%, total iron binding capacity of 4 and serum iron of 22. Last hemoglobin of 9.4 g/dL, normal white blood cell count, microcytosis and hypochromia noted, increased Red cell distribution width and mild thrombocytosis at 542,000 differential looks accurate  RADIOGRAPHIC STUDIES: I have personally reviewed the radiological images as listed and agreed with the findings in the report. DG Chest Portable 1 View  Result Date: 05/12/2022 CLINICAL DATA:  Shortness of breath. EXAM: PORTABLE CHEST 1 VIEW COMPARISON:  February 15, 2022 FINDINGS: A vascular stent projects over the superior mediastinum. The heart, hila, mediastinum, lungs, and pleura are otherwise  normal. No pneumothorax. No other acute abnormalities. IMPRESSION: A vascular stent projects over the superior mediastinum. No other abnormalities. Electronically Signed   By: Dorise Bullion III M.D.   On: 05/12/2022 15:15   UE VENOUS DUPLEX (7am - 7pm)  Result Date: 04/22/2022 UPPER VENOUS STUDY  Patient Name:  ANHELICA FOWERS  Date of Exam:  04/21/2022 Medical Rec #: [id number]             Accession #:    [id number] Date of Birth: 02-15-1976            Patient Gender: F Patient Age:   67 years Exam Location:  [facility] Procedure:      VAS [place] UPPER EXTREMITY VENOUS DUPLEX Referring Phys: MADISON REDWINE --------------------------------------------------------------------------------  Indications: Edema Comparison Study: no prior Performing Technologist: Archie Patten RVS  Examination Guidelines: A complete evaluation includes B-mode imaging, spectral Doppler, color Doppler, and power Doppler as needed of all accessible portions of each vessel. Bilateral testing is considered an integral part of a complete examination. Limited examinations for reoccurring indications may be performed as noted.  Right Findings: +----------+------------+---------+-----------+----------+-------+ RIGHT     CompressiblePhasicitySpontaneousPropertiesSummary +----------+------------+---------+-----------+----------+-------+ Subclavian               Yes       Yes                      +----------+------------+---------+-----------+----------+-------+  Left Findings: +----------+------------+---------+-----------+----------+-------+ LEFT      CompressiblePhasicitySpontaneousPropertiesSummary +----------+------------+---------+-----------+----------+-------+ IJV           Full       Yes       Yes                      +----------+------------+---------+-----------+----------+-------+ Subclavian    Full       Yes       Yes                       +----------+------------+---------+-----------+----------+-------+ Axillary      Full       Yes       Yes                      +----------+------------+---------+-----------+----------+-------+ Brachial      Full       Yes       Yes                      +----------+------------+---------+-----------+----------+-------+ Radial        Full                                          +----------+------------+---------+-----------+----------+-------+ Ulnar         Full                                          +----------+------------+---------+-----------+----------+-------+ Cephalic      Full                                          +----------+------------+---------+-----------+----------+-------+ Basilic       Full                                          +----------+------------+---------+-----------+----------+-------+  Summary:  Right: No evidence of thrombosis in the subclavian.  Left: No evidence of deep vein thrombosis in the upper extremity. No evidence of  superficial vein thrombosis in the upper extremity.  *See table(s) above for measurements and observations.  Diagnosing physician: Deitra Mayo MD Electronically signed by Deitra Mayo MD on 04/22/2022 at 6:57:33 AM.    Final    CT ANGIO UP EXTREM LEFT W &/OR WO CONTAST  Result Date: 04/22/2022 CLINICAL DATA:  Vascular murmur, left upper arm pain. Recent brachiocephalic thrombus on the right. EXAM: CT ANGIOGRAPHY OF THE LEFT UPPEREXTREMITY TECHNIQUE: Multidetector CT imaging of the left upperwas performed using the standard protocol during bolus administration of intravenous contrast. Multiplanar CT image reconstructions and MIPs were obtained to evaluate the vascular anatomy. RADIATION DOSE REDUCTION: This exam was performed according to the departmental dose-optimization program which includes automated exposure control, adjustment of the mA and/or kV according to patient size and/or use of iterative  reconstruction technique. CONTRAST:  153m OMNIPAQUE IOHEXOL 350 MG/ML SOLN COMPARISON:  01/02/2021. FINDINGS: The visualized subclavian, axillary, brachial, radial, ulnar, and interosseous arteries are patent. No acute fracture or dislocation is seen. The musculature and right upper extremity soft tissues are within normal limits. Multiple nodular densities are noted in the left breast measuring up to 3.2 cm, previously characterized as cysts by ultrasound. Review of the MIP images confirms the above findings. IMPRESSION: Patent left upper extremity arterial vasculature. Electronically Signed   By: LBrett FairyM.D.   On: 04/22/2022 05:03   CT ANGIO HEAD NECK W WO CM  Result Date: 04/22/2022 CLINICAL DATA:  Left arm pain EXAM: CT ANGIOGRAPHY HEAD AND NECK TECHNIQUE: Multidetector CT imaging of the head and neck was performed using the standard protocol during bolus administration of intravenous contrast. Multiplanar CT image reconstructions and MIPs were obtained to evaluate the vascular anatomy. Carotid stenosis measurements (when applicable) are obtained utilizing NASCET criteria, using the distal internal carotid diameter as the denominator. RADIATION DOSE REDUCTION: This exam was performed according to the departmental dose-optimization program which includes automated exposure control, adjustment of the mA and/or kV according to patient size and/or use of iterative reconstruction technique. CONTRAST:  1359mOMNIPAQUE IOHEXOL 350 MG/ML SOLN COMPARISON:  04/09/2022 FINDINGS: CT HEAD FINDINGS Brain: No evidence of acute infarction, hemorrhage, hydrocephalus, extra-axial collection or mass lesion/mass effect. Vascular: No hyperdense vessel or unexpected calcification. Skull: Normal. Negative for fracture or focal lesion. Sinuses/Orbits: Negative Review of the MIP images confirms the above findings CTA NECK FINDINGS Aortic arch: Normal. Brachiocephalic stent without residual thrombus or adjacent dissection  seen. There is degradation overall mild image blurring from motion artifact. Right carotid system: Vessels are smoothly contoured and widely patent Left carotid system: Vessels are smoothly contoured and widely patent Vertebral arteries: No proximal subclavian or vertebral stenosis or beading. Skeleton: No acute finding Other neck: Swelling in the right lateral neck with a bubble of gas, there has been recent carotid cutdown for brachiocephalic stenting. Recent thyroid ultrasound. Upper chest: No acute finding Review of the MIP images confirms the above findings CTA HEAD FINDINGS Anterior circulation: No branch occlusion, beading, aneurysm, or stenosis. Posterior circulation: No branch occlusion, beading, aneurysm, or stenosis. Venous sinuses: Diffusely patent Anatomic variants: None significant Review of the MIP images confirms the above findings IMPRESSION: Uncomplicated interval brachiocephalic stent. No new abnormality or explanation for left arm symptoms. Electronically Signed   By: JoJorje Guild.D.   On: 04/22/2022 05:00   DG C-Arm 1-60 Min  Result Date: 04/14/2022 CLINICAL DATA:  Carotid cutdown with brachiocephalic stent EXAM: DG C-ARM 1-60 MIN COMPARISON:  CTA chest dated 04/12/2022 FINDINGS: Four fluoroscopic images and  6 fluoroscopic cines obtained during carotid cutdown with brachiocephalic stent. 3 minutes 33 seconds fluoro time utilized. Radiation dose 64.92 mGy Kerma. Please see performing physicians operative report for full details IMPRESSION: Fluoroscopic images were obtained for intraoperative guidance of carotid cutdown with brachiocephalic stent. Electronically Signed   By: Darrin Nipper M.D.   On: 04/14/2022 13:13   DG C-Arm 1-60 Min-No Report  Result Date: 04/14/2022 Fluoroscopy was utilized by the requesting physician.  No radiographic interpretation.   DG C-Arm 1-60 Min-No Report  Result Date: 04/14/2022 Fluoroscopy was utilized by the requesting physician.  No radiographic  interpretation.    All questions were answered. The patient knows to call the clinic with any problems, questions or concerns. I spent 40 minutes in the care of this patient including H and P, review of records, counseling and coordination of care.     Benay Pike, MD 05/14/2022 12:30 PM

## 2022-05-15 ENCOUNTER — Ambulatory Visit: Payer: Self-pay | Admitting: *Deleted

## 2022-05-15 ENCOUNTER — Other Ambulatory Visit (HOSPITAL_COMMUNITY): Payer: Self-pay

## 2022-05-15 ENCOUNTER — Other Ambulatory Visit: Payer: Self-pay | Admitting: Family

## 2022-05-15 NOTE — Telephone Encounter (Signed)
Please see request for eliquis to be ordered not starter kit.

## 2022-05-15 NOTE — Telephone Encounter (Signed)
Copied from [place]. Topic: General - Other >> May 15, 2022  9:55 AM Everette C wrote: Reason for CRM: Medication Refill - Medication: Rx #: [id number]  aspirin EC 81 MG tablet [id number]   Rx #: [id number]  pantoprazole (PROTONIX) 40 MG tablet [id number]   Rx #: [id number]  rosuvastatin (CRESTOR) 10 MG tablet [id number]   baclofen (LIORESAL) 10 MG tablet [id number]  traMADol (ULTRAM) 50 MG tablet [id number] Rx #: [id number] traMADol (ULTRAM) 50 MG tablet [id number]   Has the patient contacted their pharmacy? Yes.   (Agent: If no, request that the patient contact the pharmacy for the refill. If patient does not wish to contact the pharmacy document the reason why and proceed with request.) (Agent: If yes, when and what did the pharmacy advise?)  Preferred Pharmacy (with phone number or street name): [id number] [place] [id number]: [phone]: [phone]: Not open 24 hours   Has the patient been seen for an appointment in the last year OR does the patient have an upcoming appointment? Yes.    Agent: Please be advised that RX refills may take up to 3 business days. We ask that you follow-up with your pharmacy.

## 2022-05-15 NOTE — Telephone Encounter (Signed)
Requested medication (s) are due for refill today: yes  Requested medication (s) are on the active medication list: yes  Last refill:  multiple dates  Future visit scheduled: yes  Notes to clinic:  Unable to refill per protocol, last refill by another provider. Routing for approval, patient inquiring about medication.     Requested Prescriptions  Pending Prescriptions Disp Refills   aspirin EC 81 MG tablet 30 tablet 0    Sig: Take 1 tablet (81 mg total) by mouth daily at 6 (six) AM. Swallow whole.     Analgesics:  NSAIDS - aspirin Passed - 05/15/2022  1:12 PM      Passed - Cr in normal range and within 360 days    Creatinine, Ser  Date Value Ref Range Status  05/12/2022 0.75 0.44 - 1.00 mg/dL Final         Passed - eGFR is 10 or above and within 360 days    GFR calc Af Amer  Date Value Ref Range Status  01/09/2020 96 >59 mL/min/1.73 Final    Comment:    **[facility] currently reports eGFR in compliance with the current**   recommendations of the [employer]. [facility] will   update reporting as new guidelines are published from the NKF-ASN   Task force.    GFR, Estimated  Date Value Ref Range Status  05/12/2022 >60 >60 mL/min Final    Comment:    (NOTE) Calculated using the CKD-EPI Creatinine Equation (2021)          Passed - Patient is not pregnant      Passed - Valid encounter within last 12 months    Recent Outpatient Visits           3 weeks ago Hospital discharge follow-up   [facility] at [facility], [place], NP   1 month ago Encounter to establish care   [facility] at [facility], [place], NP   3 years ago Muscle spasm   [place], [place]ong, MD   3 years ago Trichomonas vaginitis   [place], [place]       Future Appointments             In 4 days Harl Bowie, Royetta Crochet, MD [place] at  [facility]   In 1 week Camillia Herter, NP [facility] at [facility]   In 2 weeks Penumalli, Earlean Polka, MD [facility]             pantoprazole (PROTONIX) 40 MG tablet 30 tablet 0    Sig: Take 1 tablet (40 mg total) by mouth daily.     Gastroenterology: Proton Pump Inhibitors Passed - 05/15/2022  1:12 PM      Passed - Valid encounter within last 12 months    Recent Outpatient Visits           3 weeks ago Hospital discharge follow-up   [facility] at [facility], [place], NP   1 month ago Encounter to establish care   [facility] at [facility], [place], NP   3 years ago Muscle spasm   [place], MD   3 years ago Trichomonas vaginitis   [place], [place], [place]  Future Appointments             In 4 days Branch, Royetta Crochet, MD [place] at [facility]   In 1 week Camillia Herter, NP [facility] at [facility]   In 2 weeks Penumalli, Earlean Polka, MD [facility]             rosuvastatin (CRESTOR) 10 MG tablet 30 tablet 0    Sig: Take 1 tablet (10 mg total) by mouth daily.     Cardiovascular:  Antilipid - Statins 2 Failed - 05/15/2022  1:12 PM      Failed - Lipid Panel in normal range within the last 12 months    Cholesterol, Total  Date Value Ref Range Status  03/07/2019 138 100 - 199 mg/dL Final   Cholesterol  Date Value Ref Range Status  04/10/2022 138 0 - 200 mg/dL Final   LDL Chol Calc (NIH)  Date Value Ref Range Status  03/07/2019 87 0 - 99 mg/dL Final   LDL Cholesterol  Date Value Ref Range Status  04/10/2022 68 0 - 99 mg/dL Final    Comment:           Total Cholesterol/HDL:CHD Risk Coronary Heart Disease Risk Table                     Men   Women  1/2 Average Risk   3.4   3.3  Average Risk        5.0   4.4  2 X Average Risk   9.6   7.1  3 X Average Risk  23.4   11.0        Use the calculated Patient Ratio above and the CHD Risk Table to determine the patient's CHD Risk.        ATP III CLASSIFICATION (LDL):  <100     mg/dL   Optimal  100-129  mg/dL   Near or Above                    Optimal  130-159  mg/dL   Borderline  160-189  mg/dL   High  >190     mg/dL   Very High Performed at [facility] [address], [place], [place] [address]    HDL  Date Value Ref Range Status  04/10/2022 36 (L) >40 mg/dL Final  03/07/2019 30 (L) >39 mg/dL Final   Triglycerides  Date Value Ref Range Status  04/10/2022 171 (H) <150 mg/dL Final         Passed - Cr in normal range and within 360 days    Creatinine, Ser  Date Value Ref Range Status  05/12/2022 0.75 0.44 - 1.00 mg/dL Final         Passed - Patient is not pregnant      Passed - Valid encounter within last 12 months    Recent Outpatient Visits           3 weeks ago Hospital discharge follow-up   [facility], [place], NP   1 month ago Encounter to establish care   [facility] Care at [facility], [place], NP   3 years ago Muscle spasm   [place], Enobong, MD   3 years ago Trichomonas vaginitis   [place], [place], [place]  Future Appointments             In 4 days Janina Mayo, MD [place] at [facility]   In 1 week Camillia Herter, NP [facility] at [facility]   In 2 weeks Penumalli, Earlean Polka, MD [facility]             traMADol (ULTRAM) 50 MG tablet 12 tablet 0    Sig: Take 1 tablet (50 mg total) by mouth every 6 (six) hours as needed for up to 12 doses.     Not Delegated - Analgesics:  Opioid Agonists Failed - 05/15/2022  1:12 PM      Failed - This refill cannot be delegated       Passed - Urine Drug Screen completed in last 360 days      Passed - Valid encounter within last 3 months    Recent Outpatient Visits           3 weeks ago Hospital discharge follow-up   [facility] at [facility], [place], NP   1 month ago Encounter to establish care   [facility] at [facility], [place], NP   3 years ago Muscle spasm   [place], Enobong, MD   3 years ago Trichomonas vaginitis   [place], [place]       Future Appointments             In 4 days Harl Bowie, Royetta Crochet, MD [place] at [facility]   In 1 week Camillia Herter, NP [facility] at [facility]   In 2 weeks Penumalli, Earlean Polka, MD [place] [facility]             baclofen (LIORESAL) 10 MG tablet 30 each 0    Sig: Take 1 tablet (10 mg total) by mouth 3 (three) times daily.     Analgesics:  Muscle Relaxants - baclofen Passed - 05/15/2022  1:12 PM      Passed - Cr in normal range and within 180 days    Creatinine, Ser  Date Value Ref Range Status  05/12/2022 0.75 0.44 - 1.00 mg/dL Final         Passed - eGFR is 30 or above and within 180 days    GFR calc Af Amer  Date Value Ref Range Status  01/09/2020 96 >59 mL/min/1.73 Final    Comment:    **[facility] currently reports eGFR in compliance with the current**   recommendations of the [employer]. [facility] will   update reporting as new guidelines are published from the NKF-ASN   Task force.    GFR, Estimated  Date Value Ref Range Status  05/12/2022 >60 >60 mL/min Final    Comment:    (NOTE) Calculated using the CKD-EPI Creatinine Equation (2021)          Passed - Valid encounter within last 6 months    Recent Outpatient Visits           3 weeks ago Hospital discharge follow-up   [facility] at [facility], [place], NP   1 month ago Encounter to establish care   [facility] at [facility], [place], NP   3 years ago Muscle spasm   [place], [place]   3 years ago Trichomonas vaginitis   [place], [place]       Future Appointments             In 4 days [place], Royetta Crochet, MD [place] at [facility]   In 1 week Camillia Herter, NP [place] at [facility]   In 2 weeks Penumalli, Earlean Polka, MD [facility]

## 2022-05-15 NOTE — Telephone Encounter (Signed)
Patient called in to check on the status of her refills. Patient says she is completely out of her medications.

## 2022-05-15 NOTE — Telephone Encounter (Signed)
Spoke to pt aware of Cardiology appt scheduled for 05/19/22@ 840am.

## 2022-05-15 NOTE — Telephone Encounter (Signed)
Requested medication (s) are due for refill today - yes  Requested medication (s) are on the active medication list -yes  Future visit scheduled -yes  Last refill: Aspirin, pantoprazole, rosuvastatin - 04/17/22  #30                  Baclofen- 02/25/22 #30                  Tramadol- 04/17/22 #12- non delegated Rx  Notes to clinic: all outside provider Rx- patient has appointment scheduled 05/27/22- review for RF  Requested Prescriptions  Pending Prescriptions Disp Refills   aspirin EC 81 MG tablet 30 tablet 0    Sig: Take 1 tablet (81 mg total) by mouth daily at 6 (six) AM. Swallow whole.     Analgesics:  NSAIDS - aspirin Passed - 05/15/2022 11:16 AM      Passed - Cr in normal range and within 360 days    Creatinine, Ser  Date Value Ref Range Status  05/12/2022 0.75 0.44 - 1.00 mg/dL Final         Passed - eGFR is 10 or above and within 360 days    GFR calc Af Amer  Date Value Ref Range Status  01/09/2020 96 >59 mL/min/1.73 Final    Comment:    **[facility] currently reports eGFR in compliance with the current**   recommendations of the [employer]. [facility] will   update reporting as new guidelines are published from the NKF-ASN   Task force.    GFR, Estimated  Date Value Ref Range Status  05/12/2022 >60 >60 mL/min Final    Comment:    (NOTE) Calculated using the CKD-EPI Creatinine Equation (2021)          Passed - Patient is not pregnant      Passed - Valid encounter within last 12 months    Recent Outpatient Visits           3 weeks ago Hospital discharge follow-up   [facility] at [facility], [place], NP   1 month ago Encounter to establish care   [facility] at [facility], [place], NP   3 years ago Muscle spasm   [place], Enobong, MD   3 years ago Trichomonas vaginitis   [place], [place]       Future  Appointments             In 4 days Harl Bowie, Royetta Crochet, MD [place] at [facility]   In 1 week Camillia Herter, NP [facility] Primary [facility] at [facility]   In 2 weeks Penumalli, Earlean Polka, MD [facility]             pantoprazole (PROTONIX) 40 MG tablet 30 tablet 0    Sig: Take 1 tablet (40 mg total) by mouth daily.     Gastroenterology: Proton Pump Inhibitors Passed - 05/15/2022 11:16 AM      Passed - Valid encounter within last 12 months    Recent Outpatient Visits           3 weeks ago Hospital discharge follow-up   [facility] at [facility], [place], NP   1 month ago Encounter to establish care   [facility] Primary Care at [facility], [place], NP   3 years  ago Muscle spasm   [place], Enobong, MD   3 years ago Trichomonas vaginitis   [place], [place]       Future Appointments             In 4 days Harl Bowie, Royetta Crochet, MD [place] at [facility]   In 1 week Camillia Herter, NP [facility] at [facility]   In 2 weeks Penumalli, Earlean Polka, MD [facility]             rosuvastatin (CRESTOR) 10 MG tablet 30 tablet 0    Sig: Take 1 tablet (10 mg total) by mouth daily.     Cardiovascular:  Antilipid - Statins 2 Failed - 05/15/2022 11:16 AM      Failed - Lipid Panel in normal range within the last 12 months    Cholesterol, Total  Date Value Ref Range Status  03/07/2019 138 100 - 199 mg/dL Final   Cholesterol  Date Value Ref Range Status  04/10/2022 138 0 - 200 mg/dL Final   LDL Chol Calc (NIH)  Date Value Ref Range Status  03/07/2019 87 0 - 99 mg/dL Final   LDL Cholesterol  Date Value Ref Range Status  04/10/2022 68 0 - 99 mg/dL Final    Comment:           Total Cholesterol/HDL:CHD Risk Coronary Heart Disease Risk Table                      Men   Women  1/2 Average Risk   3.4   3.3  Average Risk       5.0   4.4  2 X Average Risk   9.6   7.1  3 X Average Risk  23.4   11.0        Use the calculated Patient Ratio above and the CHD Risk Table to determine the patient's CHD Risk.        ATP III CLASSIFICATION (LDL):  <100     mg/dL   Optimal  100-129  mg/dL   Near or Above                    Optimal  130-159  mg/dL   Borderline  160-189  mg/dL   High  >190     mg/dL   Very High Performed at [place] [address] [place], [place] [address]    HDL  Date Value Ref Range Status  04/10/2022 36 (L) >40 mg/dL Final  03/07/2019 30 (L) >39 mg/dL Final   Triglycerides  Date Value Ref Range Status  04/10/2022 171 (H) <150 mg/dL Final         Passed - Cr in normal range and within 360 days    Creatinine, Ser  Date Value Ref Range Status  05/12/2022 0.75 0.44 - 1.00 mg/dL Final         Passed - Patient is not pregnant      Passed - Valid encounter within last 12 months    Recent Outpatient Visits           3 weeks ago Hospital discharge follow-up   [facility] at [facility], [place], NP   1 month ago Encounter to establish care   [facility] Primary Care at [facility], Amy J,  NP   3 years ago Muscle spasm   [place], Charlane Ferretti, MD   3 years ago Trichomonas vaginitis   [place], [place]       Future Appointments             In 4 days Janina Mayo, MD [place] at [facility]   In 1 week Camillia Herter, NP [facility] at [facility]   In 2 weeks Penumalli, Earlean Polka, MD [facility]             traMADol (ULTRAM) 50 MG tablet 12 tablet 0    Sig: Take 1 tablet (50 mg total) by mouth every 6 (six) hours as needed for up to 12 doses.     Not Delegated - Analgesics:  Opioid Agonists  Failed - 05/15/2022 11:16 AM      Failed - This refill cannot be delegated      Passed - Urine Drug Screen completed in last 360 days      Passed - Valid encounter within last 3 months    Recent Outpatient Visits           3 weeks ago Hospital discharge follow-up   [facility] at [facility], [place], NP   1 month ago Encounter to establish care   [facility] at [facility], [place], NP   3 years ago Muscle spasm   [place], Enobong, MD   3 years ago Trichomonas vaginitis   [place], [place]       Future Appointments             In 4 days Harl Bowie, Royetta Crochet, MD [place] at [facility]   In 1 week Camillia Herter, NP [facility] at [facility]   In 2 weeks Penumalli, Earlean Polka, MD [place] [facility]             baclofen (LIORESAL) 10 MG tablet 30 each 0    Sig: Take 1 tablet (10 mg total) by mouth 3 (three) times daily.     Analgesics:  Muscle Relaxants - baclofen Passed - 05/15/2022 11:16 AM      Passed - Cr in normal range and within 180 days    Creatinine, Ser  Date Value Ref Range Status  05/12/2022 0.75 0.44 - 1.00 mg/dL Final         Passed - eGFR is 30 or above and within 180 days    GFR calc Af Amer  Date Value Ref Range Status  01/09/2020 96 >59 mL/min/1.73 Final    Comment:    **[facility] currently reports eGFR in compliance with the current**   recommendations of the [employer]. [facility] will   update reporting as new guidelines are published from the NKF-ASN   Task force.    GFR, Estimated  Date Value Ref Range Status  05/12/2022 >60 >60 mL/min Final    Comment:    (NOTE) Calculated using the CKD-EPI Creatinine Equation (2021)          Passed - Valid encounter within last 6 months    Recent Outpatient Visits           3 weeks ago  Hospital discharge follow-up  [facility] at [facility], [place], NP   1 month ago Encounter to establish care   [facility] at [facility], [place], NP   3 years ago Muscle spasm   [place], Enobong, MD   3 years ago Trichomonas vaginitis   [place], [place]       Future Appointments             In 4 days Harl Bowie, Royetta Crochet, MD [place] at [facility]   In 1 week Camillia Herter, NP [facility] at [facility]   In 2 weeks Penumalli, Earlean Polka, MD [facility]               Requested Prescriptions  Pending Prescriptions Disp Refills   aspirin EC 81 MG tablet 30 tablet 0    Sig: Take 1 tablet (81 mg total) by mouth daily at 6 (six) AM. Swallow whole.     Analgesics:  NSAIDS - aspirin Passed - 05/15/2022 11:16 AM      Passed - Cr in normal range and within 360 days    Creatinine, Ser  Date Value Ref Range Status  05/12/2022 0.75 0.44 - 1.00 mg/dL Final         Passed - eGFR is 10 or above and within 360 days    GFR calc Af Amer  Date Value Ref Range Status  01/09/2020 96 >59 mL/min/1.73 Final    Comment:    **[facility] currently reports eGFR in compliance with the current**   recommendations of the [employer]. [facility] will   update reporting as new guidelines are published from the NKF-ASN   Task force.    GFR, Estimated  Date Value Ref Range Status  05/12/2022 >60 >60 mL/min Final    Comment:    (NOTE) Calculated using the CKD-EPI Creatinine Equation (2021)          Passed - Patient is not pregnant      Passed - Valid encounter within last 12 months    Recent Outpatient Visits           3 weeks ago Hospital discharge follow-up   [facility] at [facility], [place], NP   1 month ago Encounter to establish care    [facility] at [facility], [place], NP   3 years ago Muscle spasm   [place], Enobong, MD   3 years ago Trichomonas vaginitis   [place], [place]       Future Appointments             In 4 days Harl Bowie, Royetta Crochet, MD [place] at [facility]   In 1 week Camillia Herter, NP [place] [facility] at [facility]   In 2 weeks Penumalli, Earlean Polka, MD [facility]             pantoprazole (PROTONIX) 40 MG tablet 30 tablet 0    Sig: Take 1 tablet (40 mg total) by mouth daily.     Gastroenterology: Proton Pump Inhibitors Passed - 05/15/2022 11:16 AM      Passed - Valid encounter within last 12 months    Recent Outpatient Visits  3 weeks ago Hospital discharge follow-up   [facility] at [facility], [place], NP   1 month ago Encounter to establish care   [facility] at [facility], [place], NP   3 years ago Muscle spasm   [place], Enobong, MD   3 years ago Trichomonas vaginitis   [place], [place]       Future Appointments             In 4 days Harl Bowie, Royetta Crochet, MD [place] at [facility]   In 1 week Camillia Herter, NP [facility] at [facility]   In 2 weeks Penumalli, Earlean Polka, MD [facility]             rosuvastatin (CRESTOR) 10 MG tablet 30 tablet 0    Sig: Take 1 tablet (10 mg total) by mouth daily.     Cardiovascular:  Antilipid - Statins 2 Failed - 05/15/2022 11:16 AM      Failed - Lipid Panel in normal range within the last 12 months    Cholesterol, Total  Date Value Ref Range Status  03/07/2019 138 100 - 199 mg/dL Final   Cholesterol  Date Value Ref Range Status  04/10/2022  138 0 - 200 mg/dL Final   LDL Chol Calc (NIH)  Date Value Ref Range Status  03/07/2019 87 0 - 99 mg/dL Final   LDL Cholesterol  Date Value Ref Range Status  04/10/2022 68 0 - 99 mg/dL Final    Comment:           Total Cholesterol/HDL:CHD Risk Coronary Heart Disease Risk Table                     Men   Women  1/2 Average Risk   3.4   3.3  Average Risk       5.0   4.4  2 X Average Risk   9.6   7.1  3 X Average Risk  23.4   11.0        Use the calculated Patient Ratio above and the CHD Risk Table to determine the patient's CHD Risk.        ATP III CLASSIFICATION (LDL):  <100     mg/dL   Optimal  100-129  mg/dL   Near or Above                    Optimal  130-159  mg/dL   Borderline  160-189  mg/dL   High  >190     mg/dL   Very High Performed at [place] [address] [place], [place] [address]    HDL  Date Value Ref Range Status  04/10/2022 36 (L) >40 mg/dL Final  03/07/2019 30 (L) >39 mg/dL Final   Triglycerides  Date Value Ref Range Status  04/10/2022 171 (H) <150 mg/dL Final         Passed - Cr in normal range and within 360 days    Creatinine, Ser  Date Value Ref Range Status  05/12/2022 0.75 0.44 - 1.00 mg/dL Final         Passed - Patient is not pregnant      Passed - Valid encounter within last 12 months    Recent Outpatient Visits  3 weeks ago Hospital discharge follow-up   [facility] at [facility], [place], NP   1 month ago Encounter to establish care   [facility] at [facility], [place], NP   3 years ago Muscle spasm   [place], Enobong, MD   3 years ago Trichomonas vaginitis   [place], [place]       Future Appointments             In 4 days Janina Mayo, MD [place] at [facility]   In 1 week Camillia Herter, NP [facility] at [facility]   In 2 weeks Penumalli, Earlean Polka, MD [facility]             traMADol (ULTRAM) 50 MG tablet 12 tablet 0    Sig: Take 1 tablet (50 mg total) by mouth every 6 (six) hours as needed for up to 12 doses.     Not Delegated - Analgesics:  Opioid Agonists Failed - 05/15/2022 11:16 AM      Failed - This refill cannot be delegated      Passed - Urine Drug Screen completed in last 360 days      Passed - Valid encounter within last 3 months    Recent Outpatient Visits           3 weeks ago Hospital discharge follow-up   [facility] at [facility], [place], NP   1 month ago Encounter to establish care   [facility] at [facility], [place], NP   3 years ago Muscle spasm   [place], Enobong, MD   3 years ago Trichomonas vaginitis   [place], [place]       Future Appointments             In 4 days Harl Bowie, Royetta Crochet, MD [place] at [facility]   In 1 week Camillia Herter, NP [facility] at [facility]   In 2 weeks Penumalli, Earlean Polka, MD [place] [facility]             baclofen (LIORESAL) 10 MG tablet 30 each 0    Sig: Take 1 tablet (10 mg total) by mouth 3 (three) times daily.     Analgesics:  Muscle Relaxants - baclofen Passed - 05/15/2022 11:16 AM      Passed - Cr in normal range and within 180 days    Creatinine, Ser  Date Value Ref Range Status  05/12/2022 0.75 0.44 - 1.00 mg/dL Final         Passed - eGFR is 30 or above and within 180 days    GFR calc Af Amer  Date Value Ref Range Status  01/09/2020 96 >59 mL/min/1.73 Final    Comment:    **[facility] currently reports eGFR in compliance with the current**   recommendations of the [employer]. [facility] will   update reporting as new guidelines are published from the NKF-ASN    Task force.    GFR, Estimated  Date Value Ref Range Status  05/12/2022 >60 >60 mL/min Final    Comment:    (NOTE) Calculated using the CKD-EPI Creatinine Equation (2021)  Passed - Valid encounter within last 6 months    Recent Outpatient Visits           3 weeks ago Hospital discharge follow-up   [facility] at [facility], [place], NP   1 month ago Encounter to establish care   [facility] at [facility], Amy J, NP   3 years ago Muscle spasm   [place], Enobong, MD   3 years ago Trichomonas vaginitis   [place], [place]       Future Appointments             In 4 days [place], Royetta Crochet, MD [place] at [facility]   In 1 week Camillia Herter, NP [place] at [facility]   In 2 weeks Penumalli, Earlean Polka, [facility]

## 2022-05-15 NOTE — Telephone Encounter (Signed)
Patient calling back in regards to medication refills. Last OV 04/22/22 patient is very upset her eliquis has not been refilled. Please advise only starter kit eliquis noted on current medication list. Patient also requesting multiple medications. Please advise. Hx blood clot.

## 2022-05-18 ENCOUNTER — Emergency Department (HOSPITAL_COMMUNITY)
Admission: EM | Admit: 2022-05-18 | Discharge: 2022-05-18 | Disposition: A | Discharge status: Home / Self Care | Payer: BLUE CROSS/BLUE SHIELD | Attending: Emergency Medicine | Admitting: Emergency Medicine

## 2022-05-18 ENCOUNTER — Other Ambulatory Visit: Payer: Self-pay

## 2022-05-18 ENCOUNTER — Encounter (HOSPITAL_COMMUNITY): Payer: Self-pay

## 2022-05-18 ENCOUNTER — Emergency Department (HOSPITAL_COMMUNITY): Payer: BLUE CROSS/BLUE SHIELD

## 2022-05-18 DIAGNOSIS — R002 Palpitations: Secondary | ICD-10-CM | POA: Diagnosis not present

## 2022-05-18 LAB — BASIC METABOLIC PANEL
Anion gap: 9 (ref 5–15)
BUN: 12 mg/dL (ref 6–20)
CO2: 25 mmol/L (ref 22–32)
Calcium: 8.7 mg/dL — ABNORMAL LOW (ref 8.9–10.3)
Chloride: 103 mmol/L (ref 98–111)
Creatinine, Ser: 1.46 mg/dL — ABNORMAL HIGH (ref 0.44–1.00)
GFR, Estimated: 45 mL/min — ABNORMAL LOW (ref >60)
Glucose, Bld: 105 mg/dL — ABNORMAL HIGH (ref 70–99)
Potassium: 3.6 mmol/L (ref 3.5–5.1)
Sodium: 137 mmol/L (ref 135–145)

## 2022-05-18 LAB — CBC WITH DIFFERENTIAL/PLATELET
Abs Immature Granulocytes: 0.01 10*3/uL (ref 0.00–0.07)
Basophils Absolute: 0 10*3/uL (ref 0.0–0.1)
Basophils Relative: 0 %
Eosinophils Absolute: 0.1 10*3/uL (ref 0.0–0.5)
Eosinophils Relative: 2 %
HCT: 34.5 % — ABNORMAL LOW (ref 36.0–46.0)
Hemoglobin: 10.6 g/dL — ABNORMAL LOW (ref 12.0–15.0)
Immature Granulocytes: 0 %
Lymphocytes Relative: 31 %
Lymphs Abs: 1.9 10*3/uL (ref 0.7–4.0)
MCH: 24.3 pg — ABNORMAL LOW (ref 26.0–34.0)
MCHC: 30.7 g/dL (ref 30.0–36.0)
MCV: 78.9 fL — ABNORMAL LOW (ref 80.0–100.0)
Monocytes Absolute: 0.5 10*3/uL (ref 0.1–1.0)
Monocytes Relative: 8 %
Neutro Abs: 3.7 10*3/uL (ref 1.7–7.7)
Neutrophils Relative %: 59 %
Platelets: 436 10*3/uL — ABNORMAL HIGH (ref 150–400)
RBC: 4.37 MIL/uL (ref 3.87–5.11)
RDW: 18.7 % — ABNORMAL HIGH (ref 11.5–15.5)
WBC: 6.1 10*3/uL (ref 4.0–10.5)
nRBC: 0 % (ref 0.0–0.2)

## 2022-05-18 LAB — PNH PROFILE (-HIGH SENSITIVITY)

## 2022-05-18 NOTE — Telephone Encounter (Signed)
Patient called in checking status of refills, told of 48-72 hr turnaround. She is out of these meds. See refill request

## 2022-05-18 NOTE — Progress Notes (Unsigned)
Cardiology Office Note:    Date:  05/19/2022   ID:  Connie Herrera, DOB 12-09-1975, MRN [id number]  PCP:  Camillia Herter, NP   [facility] Prov[facility] Cardiologist:  Janina Mayo, MD     Referring MD: Camillia Herter, NP   No chief complaint on file. Post CVA  History of Present Illness:    Connie Herrera is a 47 y.o. 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 She notes heart fluttering. She is menstruating. She has fibroids. She is taking her oral iron, planned for IV iron. Worried about exercising. No family hx of cardiac dx.   In January, she presented with L arm weakness and numbness. She underwent MRI which showed multiple small areas of diffusion hyperintense signal in the occipital parietal lobes bilaterally. Some of these lesions do mild enhancement. Appearance is nonspecific but could be seen with subacute infarct. Demyelinating disease a consideration but not typical. Also consider inflammatory process such as sarcoid, vasculitis, lupus and PRES. 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 Enhancement is somewhat atypical however is stable. Her BP is not well controlled.  She had a CT angio which showed Filling defect in the right brachiocephalic artery, concerning for intraluminal thrombus. She is s/p stenting with carotid cutdown. TEE was recommended for for embolic source. Hypercoag w/u negative so far. She was placed on eliquis.  Her referral was listed as palpitations. This is  a minor issue for her.   BP  140/100 mmHg  Past Medical History:  Diagnosis Date   Brain lesion    COVID-19 04/2020   CVA (cerebral vascular accident) ([facility]) 04/2022   Diplopia    Hypertension    Iron deficiency anemia     Past Surgical History:  Procedure Laterality Date   AORTOGRAM N/A 04/14/2022   Procedure: DIAGNOSTIC AORTOGRAM;  Surgeon: Broadus John, MD;  Location: [facility] OR;  Service: Vascular;  Laterality: N/A;   ENDARTERECTOMY Right 04/14/2022   Procedure: CAROTID CUTDOWN WITH BRACHIOCEPHALIC STENTING, WITH ULTRASOUND OF THE RIGHT FEMORAL VEIN;  Surgeon: Broadus John, MD;  Location: [facility] OR;  Service: Vascular;  Laterality: Right;   TUBAL LIGATION      Current Medications: Current Meds  Medication Sig   APIXABAN (ELIQUIS) VTE STARTER PACK (10MG AND 5MG) Take as directed on package: start with two-20m tablets twice daily until 04/22/2021. On 04/23/22 switch to one-562mtablet twice daily.   baclofen (LIORESAL) 10 MG tablet Take 1 tablet (10 mg total) by mouth 3 (three) times daily.   ferrous sulfate 325 (65 FE) MG EC tablet Take 1 tablet (325 mg total) by mouth 2 (two) times daily. (Patient taking differently: Take 1 tablet by mouth daily with breakfast.)   losartan (COZAAR) 25 MG tablet Take 1 tablet (25 mg total) by mouth daily.   pantoprazole (PROTONIX) 40 MG tablet Take 1 tablet (40 mg total) by mouth daily.   rosuvastatin (CRESTOR) 10 MG tablet Take 1 tablet (10 mg total) by mouth daily.   Vitamin D, Ergocalciferol, (DRISDOL) 1.25 MG (50000 UNIT) CAPS capsule Take 50,000 Units by mouth once a week.   [  DISCONTINUED] amLODipine (NORVASC) 5 MG tablet Take 5 mg by mouth daily.     Allergies:   Patient has no known allergies.   Social History   Socioeconomic History   Marital status: Single    Spouse name: Not on file   Number of children: 4   Years of education: 14   Highest education level: Some college, no degree  Occupational History   Occupation: Dietary Supervisor    Comment: [employer]   Tobacco Use   Smoking status: Former    Packs/day: 0.50    Years: 4.00    Total pack years: 2.00    Types: Cigarettes    Quit date: 06/04/2017    Years since quitting: 4.9   Smokeless tobacco: Never  Vaping Use   Vaping Use: Never used  Substance and Sexual Activity   Alcohol use: No   Drug use: Not Currently    Types: Marijuana    Comment: 09/30/20 maybe once a week   Sexual activity: Yes    Birth control/protection: Surgical  Other Topics Concern   Not on file  Social History Narrative   Patient lives at home with children.    Patient has 4 children.    Patient is single.    Patient has 2 years of college.    Patient is right handed.    Patient is working full time. [employer].    Social Determinants of Health   Financial Resource Strain: Not on file  Food Insecurity: No Food Insecurity (04/10/2022)   Hunger Vital Sign    Worried About Running Out of Food in the Last Year: Never true    Ran Out of Food in the Last Year: Never true  Transportation Needs: No Transportation Needs (04/10/2022)   PRAPARE - Hydrologist (Medical): No    Lack of Transportation (Non-Medical): No  Physical Activity: Not on file  Stress: Not on file  Social Connections: Not on file     Family History: The patient's family history includes Breast cancer in her maternal grandmother; Cancer (age of onset: 54) in her maternal grandmother; Cirrhosis in her father; Healthy in her mother.  ROS:   Please see the history of present illness.     All other systems reviewed and are negative.  EKGs/Labs/Other Studies Reviewed:    The following studies were reviewed today:   EKG:  EKG is  ordered today.  The ekg ordered today demonstrates   2/12- NSR  Recent Labs: 04/10/2022: TSH 2.461 04/16/2022: Magnesium 2.0 04/23/2022: ALT 20 05/18/2022: BUN 12; Creatinine, Ser 1.46; Hemoglobin 10.6; Platelets 436; Potassium 3.6; Sodium 137  Recent Lipid Panel    Component  Value Date/Time   CHOL 138 04/10/2022 0443   CHOL 138 03/07/2019 0831   TRIG 171 (H) 04/10/2022 0443   HDL 36 (L) 04/10/2022 0443   HDL 30 (L) 03/07/2019 0831   CHOLHDL 3.8 04/10/2022 0443   VLDL 34 04/10/2022 0443   LDLCALC 68 04/10/2022 0443   LDLCALC 87 03/07/2019 0831     Risk Assessment/Calculations:     Physical Exam:    VS:   Vitals:   05/19/22 0858  BP: (!) 140/100  Pulse: 86  SpO2: 98%     Wt Readings from Last 3 Encounters:  05/19/22 228 lb 9.6 oz (103.7 kg)  05/18/22 230 lb (104.3 kg)  05/14/22 228 lb 14.4 oz (103.8 kg)     GEN:  Well nourished, well developed in no acute  distress HEENT: Normal NECK: No JVD; No carotid bruits LYMPHATICS: No lymphadenopathy CARDIAC: RRR, no murmurs, rubs, gallops RESPIRATORY:  Clear to auscultation without rales, wheezing or rhonchi  ABDOMEN: Soft, non-tender, non-distended MUSCULOSKELETAL:  No edema; No deformity  SKIN: Warm and dry NEUROLOGIC:  Alert and oriented x 3 PSYCHIATRIC:  Normal affect   ASSESSMENT:    Palpitations: correct iron deficiency anemia  CVA: LDL 68 mg/dL, at goal. BP is a contributing factor. Will work on this. Continue eliquis. Will perform TEE to r/o PFO; ensure no other embolic source  [id number] 6.0%  HTN: elevated 140/100 mmHg.   HLD: continue crestor 10 mg daily PLAN:    In order of problems listed above:   Increase norvasc to 5 mg to 10 mg Add losartan 25 mg daily TEE to look for embolic source Follow up in 3 months      Shared Decision Making/Informed Consent The risks [esophageal damage, perforation (1:10,000 risk), bleeding, pharyngeal hematoma as well as other potential complications associated with conscious sedation including aspiration, arrhythmia, respiratory failure and death], benefits (treatment guidance and diagnostic support) and alternatives of a transesophageal echocardiogram were discussed in detail with Ms. Sharlett Iles and she is willing to proceed.      Medication Adjustments/Labs and Tests Ordered: Current medicines are reviewed at length with the patient today.  Concerns regarding medicines are outlined above.  No orders of the defined types were placed in this encounter.  Meds ordered this encounter  Medications   amLODipine (NORVASC) 10 MG tablet    Sig: Take 1 tablet (10 mg total) by mouth daily.    Dispense:  90 tablet    Refill:  3   losartan (COZAAR) 25 MG tablet    Sig: Take 1 tablet (25 mg total) by mouth daily.    Dispense:  90 tablet    Refill:  3    Patient Instructions  Medication Instructions:   INCREASE AMLODIPINE TO 10 MG ONCE DAILY=2 OF THE 5 MG TABLETS ONCE DAILY  START LOSARTAN 25 MG ONCE DAILY  *If you need a refill on your cardiac medications before your next appointment, please call your pharmacy*     Testing/Procedures:  You are scheduled for a TEE (Transesophageal Echocardiogram) on Monday, March 11 with Dr. Harl Bowie.  Please arrive at the [facility] (Main Entrance A) at [facility]: [address] [place], [place] [address] at 8:00 AM.    DIET:  Nothing to eat or drink after midnight except a sip of water with medications    FYI:  For your safety, and to allow [place] to monitor your vital signs accurately during the surgery/procedure we request: If you have artificial nails, gel coating, SNS etc, please have those removed prior to your surgery/procedure. Not having the nail coverings /polish removed may result in cancellation or delay of your surgery/procedure.  You must have a responsible person to drive you home and stay in the waiting area during your procedure. Failure to do so could result in cancellation.  Bring your insurance cards.  *Special Note: Every effort is made to have your procedure done on time. Occasionally there are emergencies that occur at the hospital that may cause delays. Please be patient if a delay does occur.       Follow-Up: At [facility], you  and your health needs are our priority.  As part of our continuing mission to provide you with exceptional heart care, we have created designated Provider Care Teams.  These  Care Teams include your primary Cardiologist (physician) and Advanced Practice Providers (APPs -  Physician Assistants and Nurse Practitioners) who all work together to provide you with the care you need, when you need it.  We recommend signing up for the patient portal called "MyChart".  Sign up information is provided on this After Visit Summary.  MyChart is used to connect with patients for Virtual Visits (Telemedicine).  Patients are able to view lab/test results, encounter notes, upcoming appointments, etc.  Non-urgent messages can be sent to your provider as well.   To learn more about what you can do with MyChart, go to [email or web address].    Your next appointment:   3 month(s)  Provider:   Phineas Inches MD      Signed, Janina Mayo, MD  05/19/2022 10:13 AM    [place]

## 2022-05-18 NOTE — ED Provider Notes (Signed)
[facility] Provider Note   CSN: [id number] Arrival date & time: 05/18/22  1858     History  Chief Complaint  Patient presents with   Palpitations    Connie Herrera is a 47 y.o. female.   Palpitations  This patient is a very pleasant 47 year old female, recently diagnosed with several medical issues, she was admitted to the hospital on January 4 approximately 5 or 6 weeks ago, during that hospitalization which occurred because of some transient neurologic symptoms, she was worked up and found to have a right brachiocephalic artery clot along with left arm weakness, a stent was placed and she was placed on Eliquis.  The patient was scheduled for a transesophageal echocardiogram tomorrow.  The patient did have a brain MRI which showed multiple small areas of diffusion abnormalities, echocardiogram showed ejection fraction of 55%, Doppler was negative for DVT.  Since that time the patient has had some palpitations, she was actually seen recently on February 6 in the emergency department because of this.  Now 6 days later she comes in because she felt the palpitations again today.  She states it is occasional, it is not constant, it is not associated with chest pain or headache or fevers or chills or nausea or vomiting or swelling of the legs.  She does have some shortness of breath with it.  It seems to come on when she lies down.    Home Medications Prior to Admission medications   Medication Sig Start Date End Date Taking? Authorizing Provider  APIXABAN (ELIQUIS) VTE STARTER PACK (10MG AND 5MG) Take as directed on package: start with two-18m tablets twice daily until 04/22/2021. On 04/23/22 switch to one-526mtablet twice daily. 04/17/22   KcAntonieta PertMD  baclofen (LIORESAL) 10 MG tablet Take 1 tablet (10 mg total) by mouth 3 (three) times daily. Patient not taking: Reported on 04/09/2022 02/25/22   RaPattricia BossMD  docusate sodium (COLACE)  100 MG capsule Take 1 capsule (100 mg total) by mouth daily. 04/17/22   KcAntonieta PertMD  ferrous sulfate 325 (65 FE) MG EC tablet Take 1 tablet (325 mg total) by mouth 2 (two) times daily. Patient taking differently: Take 1 tablet by mouth daily with breakfast. 01/08/22   IrBenay PikeMD  ondansetron (ZOFRAN) 4 MG tablet Take 1 tablet (4 mg total) by mouth every 8 (eight) hours as needed for nausea or vomiting. 04/17/22   KcAntonieta PertMD  pantoprazole (PROTONIX) 40 MG tablet Take 1 tablet (40 mg total) by mouth daily. 04/17/22 05/17/22  KcAntonieta PertMD  prochlorperazine (COMPAZINE) 10 MG tablet Take 1 tablet (10 mg total) by mouth every 8 (eight) hours as needed (nausea or headache). Patient not taking: Reported on 04/09/2022 10/23/21   BeMaudie FlakesMD  rosuvastatin (CRESTOR) 10 MG tablet Take 1 tablet (10 mg total) by mouth daily. 04/17/22 05/17/22  KcAntonieta PertMD  traMADol (ULTRAM) 50 MG tablet Take 1 tablet (50 mg total) by mouth every 6 (six) hours as needed for up to 12 doses. 04/17/22   KcAntonieta PertMD  hydrochlorothiazide (HYDRODIURIL) 25 MG tablet Take 1 tablet (25 mg total) by mouth daily. Must have office visit for refills 06/20/20 08/13/20  MaBrunetta JeansPA-C      Allergies    Patient has no known allergies.    Review of Systems   Review of Systems  Cardiovascular:  Positive for palpitations.  All other systems reviewed and are  negative.   Physical Exam Updated Vital Signs BP (!) 156/98 (BP Location: Right Arm)   Pulse 89   Temp 98.2 F (36.8 C) (Oral)   Resp 17   Ht 1.702 m (5' 7"$ )   Wt 104.3 kg   LMP 05/18/2022 (Exact Date)   SpO2 99%   BMI 36.02 kg/m  Physical Exam Vitals and nursing note reviewed.  Constitutional:      General: She is not in acute distress.    Appearance: She is well-developed.  HENT:     Head: Normocephalic and atraumatic.     Mouth/Throat:     Pharynx: No oropharyngeal exudate.  Eyes:     General: No scleral icterus.       Right eye: No  discharge.        Left eye: No discharge.     Conjunctiva/sclera: Conjunctivae normal.     Pupils: Pupils are equal, round, and reactive to light.  Neck:     Thyroid: No thyromegaly.     Vascular: No JVD.  Cardiovascular:     Rate and Rhythm: Normal rate and regular rhythm.     Heart sounds: Normal heart sounds. No murmur heard.    No friction rub. No gallop.  Pulmonary:     Effort: Pulmonary effort is normal. No respiratory distress.     Breath sounds: Normal breath sounds. No wheezing or rales.  Abdominal:     General: Bowel sounds are normal. There is no distension.     Palpations: Abdomen is soft. There is no mass.     Tenderness: There is no abdominal tenderness.  Musculoskeletal:        General: No tenderness. Normal range of motion.     Cervical back: Normal range of motion and neck supple.     Right lower leg: No edema.     Left lower leg: No edema.  Lymphadenopathy:     Cervical: No cervical adenopathy.  Skin:    General: Skin is warm and dry.     Findings: No erythema or rash.  Neurological:     Mental Status: She is alert.     Coordination: Coordination normal.  Psychiatric:        Behavior: Behavior normal.     ED Results / Procedures / Treatments   Labs (all labs ordered are listed, but only abnormal results are displayed) Labs Reviewed  BASIC METABOLIC PANEL - Abnormal; Notable for the following components:      Result Value   Glucose, Bld 105 (*)    Creatinine, Ser 1.46 (*)    Calcium 8.7 (*)    GFR, Estimated 45 (*)    All other components within normal limits  CBC WITH DIFFERENTIAL/PLATELET - Abnormal; Notable for the following components:   Hemoglobin 10.6 (*)    HCT 34.5 (*)    MCV 78.9 (*)    MCH 24.3 (*)    RDW 18.7 (*)    Platelets 436 (*)    All other components within normal limits    EKG EKG Interpretation  Date/Time:  Monday May 18 2022 19:01:33 EST Ventricular Rate:  89 PR Interval:  194 QRS Duration: 88 QT  Interval:  378 QTC Calculation: 459 R Axis:   3 Text Interpretation: Normal sinus rhythm Minimal voltage criteria for LVH, may be normal variant ( R in aVL ) Borderline ECG When compared with ECG of 12-May-2022 14:55, PREVIOUS ECG IS PRESENT Confirmed by Noemi Chapel [phone]) on 05/18/2022 8:36:59 PM  Radiology DG  Chest 2 View  Result Date: 05/18/2022 CLINICAL DATA:  Shortness of breath EXAM: CHEST - 2 VIEW COMPARISON:  Chest radiograph 05/12/2022 FINDINGS: The cardiomediastinal silhouette is stable and within normal limits. A brachiocephalic stent is again noted. The lungs are clear, with no focal consolidation or pulmonary edema. There is no pleural effusion or pneumothorax There is no acute osseous abnormality. IMPRESSION: No radiographic evidence of acute cardiopulmonary process. Electronically Signed   By: Valetta Mole M.D.   On: 05/18/2022 19:57    Procedures Procedures    Medications Ordered in ED Medications - No data to display  ED Course/ Medical Decision Making/ A&P                             Medical Decision Making  This patient presents to the ED for concern of shortness of breath and palpitations differential diagnosis includes arrhythmia, hypokalemia, anemia, ischemia, it is reassuring that the patient has been on her Eliquis and taking it as prescribed    Additional history obtained:  Additional history obtained from electronic medical record External records from outside source obtained and reviewed including prior hospitalization with extensive workup and imaging tests including echocardiogram   Lab Tests:  I Ordered, and personally interpreted labs.  The pertinent results include: CBC and metabolic panel which are overall unremarkable   Imaging Studies ordered:  I ordered imaging studies including chest x-ray I independently visualized and interpreted imaging which showed no signs of pneumonia I agree with the radiologist interpretation   Medicines  ordered and prescription drug management:   I have reviewed the patients home medicines and have made adjustments as needed   Problem List / ED Course:  No symptoms while in the emergency department   Social Determinants of Health:  The patient is cleared from a cardiac standpoint this evening as there is no signs of arrhythmia here.  She does not have chest pain or classic anginal symptoms.  The patient has a follow-up in the morning for a transesophageal echocardiogram with cardiology.  I think it is reasonable for her to be discharged to follow-up in that setting.  No need for inpatient admission at this time       Final Clinical Impression(s) / ED Diagnoses Final diagnoses:  Palpitations    Rx / DC Orders ED Discharge Orders     None         Noemi Chapel, MD 05/18/22 2123

## 2022-05-18 NOTE — ED Triage Notes (Signed)
Pt reports she has been feeling her heart flutter for a couple of days associated with shortness of breath; was seen at [facility] on 2/6 for the same complaint. She also reports her hands are shaking. She reports hx of stroke and she is worried something is wrong and just wants to be checked out. Denies CP.

## 2022-05-18 NOTE — Telephone Encounter (Signed)
Requested medication (s) are due for refill today: yes  Requested medication (s) are on the active medication list: yes  Last refill:  aspirin, pantoprazole, tramadol, Rosuvastatin : same start date 04/17/22      Baclofen: 02/04/21 #30  Future visit scheduled: yes  Notes to clinic:  Also asking for refill of Eliquis- all meds except for Baclofen and Tramadol ended 05/17/22- Tramadol not delegated to NT to RF; all meds prescriber not at this practice; Pt is out of meds   Requested Prescriptions  Pending Prescriptions Disp Refills   aspirin EC 81 MG tablet 30 tablet 0    Sig: Take 1 tablet (81 mg total) by mouth daily at 6 (six) AM. Swallow whole.     Analgesics:  NSAIDS - aspirin Passed - 05/18/2022 11:33 AM      Passed - Cr in normal range and within 360 days    Creatinine, Ser  Date Value Ref Range Status  05/12/2022 0.75 0.44 - 1.00 mg/dL Final         Passed - eGFR is 10 or above and within 360 days    GFR calc Af Amer  Date Value Ref Range Status  01/09/2020 96 >59 mL/min/1.73 Final    Comment:    **[facility] currently reports eGFR in compliance with the current**   recommendations of the [employer]. [facility] will   update reporting as new guidelines are published from the NKF-ASN   Task force.    GFR, Estimated  Date Value Ref Range Status  05/12/2022 >60 >60 mL/min Final    Comment:    (NOTE) Calculated using the CKD-EPI Creatinine Equation (2021)          Passed - Patient is not pregnant      Passed - Valid encounter within last 12 months    Recent Outpatient Visits           3 weeks ago Hospital discharge follow-up   [facility] at [facility], [place], NP   1 month ago Encounter to establish care   [facility] at [facility], [place], NP   3 years ago Muscle spasm   [place], Enobong, MD   3 years ago Trichomonas vaginitis   [place], [place]       Future Appointments             [place], Royetta Crochet, MD [place] at [facility]   In 1 week Camillia Herter, NP [facility] at [facility]   In 2 weeks Penumalli, Earlean Polka, MD [facility]             pantoprazole (PROTONIX) 40 MG tablet 30 tablet 0    Sig: Take 1 tablet (40 mg total) by mouth daily.     Gastroenterology: Proton Pump Inhibitors Passed - 05/18/2022 11:33 AM      Passed - Valid encounter within last 12 months    Recent Outpatient Visits           3 weeks ago Hospital discharge follow-up   [facility] at [facility], [place], NP   1 month ago Encounter to establish care   [facility] at [facility], [place], NP   3 years ago Muscle spasm   [place]  [facility] [place] Rakes, MD   3 years ago Trichomonas vaginitis   [place], [place]       Future Appointments             Tomorrow Branch, Royetta Crochet, MD [place] at [facility]   In 1 week Camillia Herter, NP [facility] at [facility]   In 2 weeks Penumalli, Earlean Polka, MD [facility]             rosuvastatin (CRESTOR) 10 MG tablet 30 tablet 0    Sig: Take 1 tablet (10 mg total) by mouth daily.     Cardiovascular:  Antilipid - Statins 2 Failed - 05/18/2022 11:33 AM      Failed - Lipid Panel in normal range within the last 12 months    Cholesterol, Total  Date Value Ref Range Status  03/07/2019 138 100 - 199 mg/dL Final   Cholesterol  Date Value Ref Range Status  04/10/2022 138 0 - 200 mg/dL Final   LDL Chol Calc (NIH)  Date Value Ref Range Status  03/07/2019 87 0 - 99 mg/dL Final   LDL Cholesterol  Date Value Ref Range Status  04/10/2022 68 0 - 99 mg/dL Final    Comment:           Total  Cholesterol/HDL:CHD Risk Coronary Heart Disease Risk Table                     Men   Women  1/2 Average Risk   3.4   3.3  Average Risk       5.0   4.4  2 X Average Risk   9.6   7.1  3 X Average Risk  23.4   11.0        Use the calculated Patient Ratio above and the CHD Risk Table to determine the patient's CHD Risk.        ATP III CLASSIFICATION (LDL):  <100     mg/dL   Optimal  100-129  mg/dL   Near or Above                    Optimal  130-159  mg/dL   Borderline  160-189  mg/dL   High  >190     mg/dL   Very High Performed at [facility] [address] [place] [address], [place], [place] [address]    HDL  Date Value Ref Range Status  04/10/2022 36 (L) >40 mg/dL Final  03/07/2019 30 (L) >39 mg/dL Final   Triglycerides  Date Value Ref Range Status  04/10/2022 171 (H) <150 mg/dL Final         Passed - Cr in normal range and within 360 days    Creatinine, Ser  Date Value Ref Range Status  05/12/2022 0.75 0.44 - 1.00 mg/dL Final         Passed - Patient is not pregnant      Passed - Valid encounter within last 12 months    Recent Outpatient Visits           3 weeks ago Hospital discharge follow-up   [facility] at [facility], [place], NP   1 month ago Encounter to establish care   [facility] Care at [facility], Amy J, NP   3 years ago Muscle spasm   [place], Enobong, MD   3 years ago Trichomonas vaginitis   [place], [place]       Future Appointments             Tomorrow Branch, Royetta Crochet, MD [place] at [facility]   In 1 week Camillia Herter, NP [facility] at [facility]   In 2 weeks Penumalli, Earlean Polka, MD [facility]             traMADol (ULTRAM) 50 MG tablet 12 tablet 0    Sig: Take 1 tablet (50 mg total) by mouth every 6 (six) hours as needed for up to  12 doses.     Not Delegated - Analgesics:  Opioid Agonists Failed - 05/18/2022 11:33 AM      Failed - This refill cannot be delegated      Passed - Urine Drug Screen completed in last 360 days      Passed - Valid encounter within last 3 months    Recent Outpatient Visits           3 weeks ago Hospital discharge follow-up   [facility] at [facility], [place], NP   1 month ago Encounter to establish care   [facility] at [facility], [place], NP   3 years ago Muscle spasm   [place], Enobong, MD   3 years ago Trichomonas vaginitis   [place], [place]       Future Appointments             Tomorrow Branch, Royetta Crochet, MD [place] at [facility]   In 1 week Camillia Herter, NP [facility] at [facility]   In 2 weeks Penumalli, Earlean Polka, MD [facility]             baclofen (LIORESAL) 10 MG tablet 30 each 0    Sig: Take 1 tablet (10 mg total) by mouth 3 (three) times daily.     Analgesics:  Muscle Relaxants - baclofen Passed - 05/18/2022 11:33 AM      Passed - Cr in normal range and within 180 days    Creatinine, Ser  Date Value Ref Range Status  05/12/2022 0.75 0.44 - 1.00 mg/dL Final         Passed - eGFR is 30 or above and within 180 days    GFR calc Af Amer  Date Value Ref Range Status  01/09/2020 96 >59 mL/min/1.73 Final    Comment:    **[facility] currently reports eGFR in compliance with the current**   recommendations of the [employer]. [facility] will   update reporting as new guidelines are published from the NKF-ASN   Task force.    GFR, Estimated  Date Value Ref Range Status  05/12/2022 >60 >60 mL/min Final    Comment:    (NOTE) Calculated using the CKD-EPI Creatinine Equation (2021)          Passed - Valid encounter within last 6  months    Recent Outpatient Visits           3 weeks ago Hospital discharge follow-up   [facility] at [facility], [place], NP   1 month ago  Encounter to establish care   [facility] at [facility], [place], NP   3 years ago Muscle spasm   [place], Enobong, MD   3 years ago Trichomonas vaginitis   [place], [place]       Future Appointments             Tomorrow Branch, Royetta Crochet, MD [place] at [facility]   In 1 week Camillia Herter, NP [facility] Primary [facility] at [facility]   In 2 weeks Penumalli, Earlean Polka, MD [facility]

## 2022-05-18 NOTE — Discharge Instructions (Addendum)
Please see your cardiologist tomorrow, emergency department for severe or worsening symptoms

## 2022-05-18 NOTE — H&P (View-Only) (Signed)
Cardiology Office Note:    Date:  05/19/2022   ID:  Connie Herrera, DOB 12-09-1975, MRN [id number]  PCP:  Camillia Herter, NP   [facility] Cardiologist:  Janina Mayo, MD     Referring MD: Camillia Herter, NP   No chief complaint on file. Post CVA  History of Present Illness:    Connie Herrera is a 47 y.o. 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 She notes heart fluttering. She is menstruating. She has fibroids. She is taking her oral iron, planned for IV iron. Worried about exercising. No family hx of cardiac dx.   In January, she presented with L arm weakness and numbness. She underwent MRI which showed multiple small areas of diffusion hyperintense signal in the occipital parietal lobes bilaterally. Some of these lesions do mild enhancement. Appearance is nonspecific but could be seen with subacute infarct. Demyelinating disease a consideration but not typical. Also consider inflammatory process such as sarcoid, vasculitis, lupus and PRES. 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 Enhancement is somewhat atypical however is stable. Her BP is not well controlled.  She had a CT angio which showed Filling defect in the right brachiocephalic artery, concerning for intraluminal thrombus. She is s/p stenting with carotid cutdown. TEE was recommended for for embolic source. Hypercoag w/u negative so far. She was placed on eliquis.  Her referral was listed as palpitations. This is  a minor issue for her.   BP  140/100 mmHg  Past Medical History:  Diagnosis Date   Brain lesion    COVID-19 04/2020   CVA (cerebral vascular accident) ([place]) 04/2022   Diplopia    Hypertension    Iron deficiency anemia     Past Surgical History:  Procedure Laterality Date   AORTOGRAM N/A 04/14/2022   Procedure: DIAGNOSTIC AORTOGRAM;  Surgeon: Broadus John, MD;  [facility] OR;  Service: Vascular;  Laterality: N/A;   ENDARTERECTOMY Right 04/14/2022   Procedure: CAROTID CUTDOWN WITH BRACHIOCEPHALIC STENTING, WITH ULTRASOUND OF THE RIGHT FEMORAL VEIN;  Surgeon: Broadus John, MD;  Location: [facility] OR;  Service: Vascular;  Laterality: Right;   TUBAL LIGATION      Current Medications: Current Meds  Medication Sig   APIXABAN (ELIQUIS) VTE STARTER PACK (10MG AND 5MG) Take as directed on package: start with two-20m tablets twice daily until 04/22/2021. On 04/23/22 switch to one-562mtablet twice daily.   baclofen (LIORESAL) 10 MG tablet Take 1 tablet (10 mg total) by mouth 3 (three) times daily.   ferrous sulfate 325 (65 FE) MG EC tablet Take 1 tablet (325 mg total) by mouth 2 (two) times daily. (Patient taking differently: Take 1 tablet by mouth daily with breakfast.)   losartan (COZAAR) 25 MG tablet Take 1 tablet (25 mg total) by mouth daily.   pantoprazole (PROTONIX) 40 MG tablet Take 1 tablet (40 mg total) by mouth daily.   rosuvastatin (CRESTOR) 10 MG tablet Take 1 tablet (10 mg total) by mouth daily.   Vitamin D, Ergocalciferol, (DRISDOL) 1.25 MG (50000 UNIT) CAPS capsule Take 50,000 Units by mouth once a week.   [  DISCONTINUED] amLODipine (NORVASC) 5 MG tablet Take 5 mg by mouth daily.     Allergies:   Patient has no known allergies.   Social History   Socioeconomic History   Marital status: Single    Spouse name: Not on file   Number of children: 4   Years of education: 14   Highest education level: Some college, no degree  Occupational History   Occupation: Dietary Supervisor    Comment: [employer]   Tobacco Use   Smoking status: Former    Packs/day: 0.50    Years: 4.00    Total pack years: 2.00    Types: Cigarettes    Quit date: 06/04/2017    Years since quitting: 4.9   Smokeless tobacco: Never  Vaping Use   Vaping Use: Never used  Substance and Sexual Activity   Alcohol use: No   Drug use: Not Currently    Types: Marijuana    Comment: 09/30/20 maybe once a week   Sexual activity: Yes    Birth control/protection: Surgical  Other Topics Concern   Not on file  Social History Narrative   Patient lives at home with children.    Patient has 4 children.    Patient is single.    Patient has 2 years of college.    Patient is right handed.    Patient is working full time. [facility] [employer].    Social Determinants of Health   Financial Resource Strain: Not on file  Food Insecurity: No Food Insecurity (04/10/2022)   Hunger Vital Sign    Worried About Running Out of Food in the Last Year: Never true    Ran Out of Food in the Last Year: Never true  Transportation Needs: No Transportation Needs (04/10/2022)   PRAPARE - Hydrologist (Medical): No    Lack of Transportation (Non-Medical): No  Physical Activity: Not on file  Stress: Not on file  Social Connections: Not on file     Family History: The patient's family history includes Breast cancer in her maternal grandmother; Cancer (age of onset: 54) in her maternal grandmother; Cirrhosis in her father; Healthy in her mother.  ROS:   Please see the history of present illness.     All other systems reviewed and are negative.  EKGs/Labs/Other Studies Reviewed:    The following studies were reviewed today:   EKG:  EKG is  ordered today.  The ekg ordered today demonstrates   2/12- NSR  Recent Labs: 04/10/2022: TSH 2.461 04/16/2022: Magnesium 2.0 04/23/2022: ALT 20 05/18/2022: BUN 12; Creatinine, Ser 1.46; Hemoglobin 10.6; Platelets 436; Potassium 3.6; Sodium 137  Recent Lipid Panel    Component  Value Date/Time   CHOL 138 04/10/2022 0443   CHOL 138 03/07/2019 0831   TRIG 171 (H) 04/10/2022 0443   HDL 36 (L) 04/10/2022 0443   HDL 30 (L) 03/07/2019 0831   CHOLHDL 3.8 04/10/2022 0443   VLDL 34 04/10/2022 0443   LDLCALC 68 04/10/2022 0443   LDLCALC 87 03/07/2019 0831     Risk Assessment/Calculations:     Physical Exam:    VS:   Vitals:   05/19/22 0858  BP: (!) 140/100  Pulse: 86  SpO2: 98%     Wt Readings from Last 3 Encounters:  05/19/22 228 lb 9.6 oz (103.7 kg)  05/18/22 230 lb (104.3 kg)  05/14/22 228 lb 14.4 oz (103.8 kg)     GEN:  Well nourished, well developed in no acute  distress HEENT: Normal NECK: No JVD; No carotid bruits LYMPHATICS: No lymphadenopathy CARDIAC: RRR, no murmurs, rubs, gallops RESPIRATORY:  Clear to auscultation without rales, wheezing or rhonchi  ABDOMEN: Soft, non-tender, non-distended MUSCULOSKELETAL:  No edema; No deformity  SKIN: Warm and dry NEUROLOGIC:  Alert and oriented x 3 PSYCHIATRIC:  Normal affect   ASSESSMENT:    Palpitations: correct iron deficiency anemia  CVA: LDL 68 mg/dL, at goal. BP is a contributing factor. Will work on this. Continue eliquis. Will perform TEE to r/o PFO; ensure no other embolic source  [id number] 6.0%  HTN: elevated 140/100 mmHg.   HLD: continue crestor 10 mg daily PLAN:    In order of problems listed above:   Increase norvasc to 5 mg to 10 mg Add losartan 25 mg daily TEE to look for embolic source Follow up in 3 months      Shared Decision Making/Informed Consent The risks [esophageal damage, perforation (1:10,000 risk), bleeding, pharyngeal hematoma as well as other potential complications associated with conscious sedation including aspiration, arrhythmia, respiratory failure and death], benefits (treatment guidance and diagnostic support) and alternatives of a transesophageal echocardiogram were discussed in detail with Connie Herrera and she is willing to proceed.      Medication Adjustments/Labs and Tests Ordered: Current medicines are reviewed at length with the patient today.  Concerns regarding medicines are outlined above.  No orders of the defined types were placed in this encounter.  Meds ordered this encounter  Medications   amLODipine (NORVASC) 10 MG tablet    Sig: Take 1 tablet (10 mg total) by mouth daily.    Dispense:  90 tablet    Refill:  3   losartan (COZAAR) 25 MG tablet    Sig: Take 1 tablet (25 mg total) by mouth daily.    Dispense:  90 tablet    Refill:  3    Patient Instructions  Medication Instructions:   INCREASE AMLODIPINE TO 10 MG ONCE DAILY=2 OF THE 5 MG TABLETS ONCE DAILY  START LOSARTAN 25 MG ONCE DAILY  *If you need a refill on your cardiac medications before your next appointment, please call your pharmacy*     Testing/Procedures:  You are scheduled for a TEE (Transesophageal Echocardiogram) on Monday, March 11 with Dr. Harl Bowie.  Please arrive at the [facility] (Main Entrance A) at [facility]: [address] [place], [place] [address] at 8:00 AM.    DIET:  Nothing to eat or drink after midnight except a sip of water with medications    FYI:  For your safety, and to allow [place] to monitor your vital signs accurately during the surgery/procedure we request: If you have artificial nails, gel coating, SNS etc, please have those removed prior to your surgery/procedure. Not having the nail coverings /polish removed may result in cancellation or delay of your surgery/procedure.  You must have a responsible person to drive you home and stay in the waiting area during your procedure. Failure to do so could result in cancellation.  Bring your insurance cards.  *Special Note: Every effort is made to have your procedure done on time. Occasionally there are emergencies that occur at the hospital that may cause delays. Please be patient if a delay does occur.       Follow-Up: At [facility], you  and your health needs are our priority.  As part of our continuing mission to provide you with exceptional heart care, we have created designated Provider Care Teams.  These  Care Teams include your primary Cardiologist (physician) and Advanced Practice Providers (APPs -  Physician Assistants and Nurse Practitioners) who all work together to provide you with the care you need, when you need it.  We recommend signing up for the patient portal called "MyChart".  Sign up information is provided on this After Visit Summary.  MyChart is used to connect with patients for Virtual Visits (Telemedicine).  Patients are able to view lab/test results, encounter notes, upcoming appointments, etc.  Non-urgent messages can be sent to your provider as well.   To learn more about what you can do with MyChart, go to [email or web address].    Your next appointment:   3 month(s)  Provider:   Phineas Inches MD      Signed, Janina Mayo, MD  05/19/2022 10:13 AM    [place]

## 2022-05-18 NOTE — ED Provider Triage Note (Signed)
Emergency Medicine Provider Triage Evaluation Note  Connie Herrera , a 47 y.o. female  was evaluated in triage.  Pt complains of palpitations over the last couple days with associated shortness of breath.  Seen at [facility] ED 05/12/2022 for same.  Recent episode started yesterday.  States hands were shaking as well.  Hx prior CVA, patient expresses concern.   On anticoagulant daily.  Denies chest pain, headache, vision changes, N/V/D, leg swelling or weakness, back pain, recent URI.  Review of Systems  Positive:  Negative: See above  Physical Exam  BP (!) 156/98 (BP Location: Right Arm)   Pulse 89   Temp 98.2 F (36.8 C) (Oral)   Resp 17   Ht 5' 7"$  (1.702 m)   Wt 104.3 kg   LMP 05/18/2022 (Exact Date)   SpO2 98%   BMI 36.02 kg/m  Gen:   Awake, no distress   Resp:  Normal effort  MSK:   Moves extremities without difficulty  Other:  Chest nonTTP.    Medical Decision Making  Medically screening exam initiated at 7:26 PM.  Appropriate orders placed.  Connie Herrera was informed that the remainder of the evaluation will be completed by another provider, this initial triage assessment does not replace that evaluation, and the importance of remaining in the ED until their evaluation is complete.     Connie Rome, PA-C [id number]

## 2022-05-19 ENCOUNTER — Telehealth: Payer: Self-pay

## 2022-05-19 ENCOUNTER — Encounter: Payer: Self-pay | Admitting: Internal Medicine

## 2022-05-19 ENCOUNTER — Ambulatory Visit: Payer: Self-pay | Admitting: *Deleted

## 2022-05-19 ENCOUNTER — Ambulatory Visit: Payer: BLUE CROSS/BLUE SHIELD | Attending: Internal Medicine | Admitting: Internal Medicine

## 2022-05-19 ENCOUNTER — Other Ambulatory Visit: Payer: Self-pay | Admitting: Family

## 2022-05-19 ENCOUNTER — Other Ambulatory Visit: Payer: Self-pay

## 2022-05-19 ENCOUNTER — Other Ambulatory Visit: Payer: Self-pay | Admitting: *Deleted

## 2022-05-19 VITALS — BP 140/100 | HR 86 | Ht 67.0 in | Wt 228.6 lb

## 2022-05-19 DIAGNOSIS — I639 Cerebral infarction, unspecified: Secondary | ICD-10-CM

## 2022-05-19 DIAGNOSIS — I10 Essential (primary) hypertension: Secondary | ICD-10-CM

## 2022-05-19 DIAGNOSIS — K219 Gastro-esophageal reflux disease without esophagitis: Secondary | ICD-10-CM

## 2022-05-19 DIAGNOSIS — E785 Hyperlipidemia, unspecified: Secondary | ICD-10-CM

## 2022-05-19 LAB — JAK2 (INCLUDING V617F AND EXON 12), MPL,& CALR W/RFL MPN PANEL (NGS)

## 2022-05-19 MED ORDER — APIXABAN 5 MG PO TABS: 5.0000 mg | ORAL_TABLET | Freq: Two times a day (BID) | ORAL | 0 refills | Status: DC

## 2022-05-19 MED ORDER — LOSARTAN POTASSIUM 25 MG PO TABS: 25.0000 mg | ORAL_TABLET | Freq: Every day | ORAL | 3 refills | Status: DC

## 2022-05-19 MED ORDER — PANTOPRAZOLE SODIUM 40 MG PO TBEC: 40.0000 mg | DELAYED_RELEASE_TABLET | Freq: Every day | ORAL | 2 refills | Status: DC

## 2022-05-19 MED ORDER — ASPIRIN 81 MG PO TBEC: DELAYED_RELEASE_TABLET | ORAL | 0 refills | Status: DC

## 2022-05-19 MED ORDER — ROSUVASTATIN CALCIUM 10 MG PO TABS: 10.0000 mg | ORAL_TABLET | Freq: Every day | ORAL | 0 refills | Status: DC

## 2022-05-19 MED ORDER — AMLODIPINE BESYLATE 10 MG PO TABS: 10.0000 mg | ORAL_TABLET | Freq: Every day | ORAL | 3 refills | Status: DC

## 2022-05-19 NOTE — Telephone Encounter (Signed)
Att to contact pt to advise that provider did not receive request for medication refills but will gladly refill what was needed,(no ans, no vm setup) according to previous messages refills needed on:   Asprin Tramadol Pantoprazole Rosuvastatin Eliquis

## 2022-05-19 NOTE — Telephone Encounter (Signed)
-   Eliquis, Aspirin, Rosuvastatin order complete. Please remind patient to keep upcoming appointment scheduled with Neurology on 06/02/2022. - Pantoprazole order complete.  - Upon review of hospital discharge instruction Tramadol indicated as short-term for only 12 doses.

## 2022-05-19 NOTE — Telephone Encounter (Signed)
  Chief Complaint: requesting refill on all medications 05/19/22. No refills given by PCP at this time  Symptoms: elevated BP . Went to cardiologist and got refill on amlodipine and losartan. Requesting remaining medications Frequency: 05/19/22 Pertinent Negatives: Patient denies na Disposition: '[]'$ ED /'[]'$ Urgent Care (no appt availability in office) / '[]'$ Appointment(In office/virtual)/ '[]'$  [facility]/ '[]'$ Home Care/ '[]'$ Refused Recommended Disposition /'[]'$ [place] [facility]/ '[x]'$  Follow-up with PCP Additional Notes:   Future appt scheduled for 05/27/22. Please advise and patient requesting a call back and wants to come to office regarding medications. Please advise why patient can not get Rx refills. Patient feels staff is not paying attention to her needs and putting her in danger not refilling BP medications.     Reason for Disposition  [1] Prescription refill request for NON-ESSENTIAL medicine (i.e., no harm to patient if med not taken) AND [2] triager unable to refill per department policy  Answer Assessment - Initial Assessment Questions 1. DRUG NAME: "What medicine do you need to have refilled?"     Protonix, eliquis, robaxin, aspirin all of medications listed  2. REFILLS REMAINING: "How many refills are remaining?" (Note: The label on the medicine or pill bottle will show how many refills are remaining. If there are no refills remaining, then a renewal may be needed.)     na 3. EXPIRATION DATE: "What is the expiration date?" (Note: The label states when the prescription will expire, and thus can no longer be refilled.)     na 4. PRESCRIBING HCP: "Who prescribed it?" Reason: If prescribed by specialist, call should be referred to that group.     PCP 5. SYMPTOMS: "Do you have any symptoms?"     Elevated BP 6. PREGNANCY: "Is there any chance that you are pregnant?" "When was your last menstrual period?"     na  Protocols used: Medication Refill and Renewal Call-A-AH

## 2022-05-19 NOTE — Telephone Encounter (Signed)
Att to contact pt to follow-up with medication request as pt being new to office not sure if request came to NP. Message sent to provider requesting med refill, pending completion

## 2022-05-19 NOTE — Patient Instructions (Addendum)
Medication Instructions:   INCREASE AMLODIPINE TO 10 MG ONCE DAILY=2 OF THE 5 MG TABLETS ONCE DAILY  START LOSARTAN 25 MG ONCE DAILY  *If you need a refill on your cardiac medications before your next appointment, please call your pharmacy*     Testing/Procedures:  You are scheduled for a TEE (Transesophageal Echocardiogram) on Monday, March 11 with Dr. Harl Bowie.  Please arrive at the [facility] (Main Entrance A) at [facility]: [address] [place], [place] [address] at 8:00 AM.    DIET:  Nothing to eat or drink after midnight except a sip of water with medications    FYI:  For your safety, and to allow [place] to monitor your vital signs accurately during the surgery/procedure we request: If you have artificial nails, gel coating, SNS etc, please have those removed prior to your surgery/procedure. Not having the nail coverings /polish removed may result in cancellation or delay of your surgery/procedure.  You must have a responsible person to drive you home and stay in the waiting area during your procedure. Failure to do so could result in cancellation.  Bring your insurance cards.  *Special Note: Every effort is made to have your procedure done on time. Occasionally there are emergencies that occur at the hospital that may cause delays. Please be patient if a delay does occur.       Follow-Up: At [facility], you and your health needs are our priority.  As part of our continuing mission to provide you with exceptional heart care, we have created designated Provider Care Teams.  These Care Teams include your primary Cardiologist (physician) and Advanced Practice Providers (APPs -  Physician Assistants and Nurse Practitioners) who all work together to provide you with the care you need, when you need it.  We recommend signing up for the patient portal called "MyChart".  Sign up information is provided on this After Visit Summary.  MyChart is used to connect with  patients for Virtual Visits (Telemedicine).  Patients are able to view lab/test results, encounter notes, upcoming appointments, etc.  Non-urgent messages can be sent to your provider as well.   To learn more about what you can do with MyChart, go to [email or web address].    Your next appointment:   3 month(s)  Provider:   Phineas Inches MD

## 2022-05-20 ENCOUNTER — Telehealth: Payer: Self-pay

## 2022-05-20 ENCOUNTER — Telehealth: Payer: BLUE CROSS/BLUE SHIELD | Admitting: Family Medicine

## 2022-05-20 NOTE — Progress Notes (Signed)
Pt did not show for apptmt after multiple attempts. DWB

## 2022-05-20 NOTE — Telephone Encounter (Signed)
-----   Message from Benay Pike, MD sent at 05/19/2022  4:53 PM EST ----- Mickel Baas  Iron deficiency is better. Hb stable. Patient can consider taking oral iron ferrous sulfate once a day.

## 2022-05-20 NOTE — Telephone Encounter (Signed)
Attempted to call pt to give recommendation per MD. Pt's phone did not ring and has no VM set up.

## 2022-05-21 ENCOUNTER — Telehealth: Payer: Self-pay

## 2022-05-21 ENCOUNTER — Telehealth: Payer: BLUE CROSS/BLUE SHIELD

## 2022-05-21 ENCOUNTER — Telehealth: Payer: BLUE CROSS/BLUE SHIELD | Admitting: Nurse Practitioner

## 2022-05-21 DIAGNOSIS — M62838 Other muscle spasm: Secondary | ICD-10-CM | POA: Diagnosis not present

## 2022-05-21 MED ORDER — BACLOFEN 10 MG PO TABS: 10.0000 mg | ORAL_TABLET | Freq: Three times a day (TID) | ORAL | 0 refills | Status: DC

## 2022-05-21 NOTE — Progress Notes (Signed)
Virtual Visit Consent   Connie Herrera, you are scheduled for a virtual visit with a [facility] provider today. Just as with appointments in the office, your consent must be obtained to participate. Your consent will be active for this visit and any virtual visit you may have with one of our providers in the next 365 days. If you have a MyChart account, a copy of this consent can be sent to you electronically.  As this is a virtual visit, video technology does not allow for your provider to perform a traditional examination. This may limit your provider's ability to fully assess your condition. If your provider identifies any concerns that need to be evaluated in person or the need to arrange testing (such as labs, EKG, etc.), we will make arrangements to do so. Although advances in technology are sophisticated, we cannot ensure that it will always work on either your end or our end. If the connection with a video visit is poor, the visit may have to be switched to a telephone visit. With either a video or telephone visit, we are not always able to ensure that we have a secure connection.  By engaging in this virtual visit, you consent to the provision of healthcare and authorize for your insurance to be billed (if applicable) for the services provided during this visit. Depending on your insurance coverage, you may receive a charge related to this service.  I need to obtain your verbal consent now. Are you willing to proceed with your visit today? Connie Herrera has provided verbal consent on 05/21/2022 for a virtual visit (video or telephone). Tish Men, NP  Date: 05/21/2022 4:44 PM  Virtual Visit via Video Note   I, Connie Herrera, connected with  Connie Herrera  ([id number], 1975/06/19) on 05/21/22 at  4:30 PM EST by a video-enabled telemedicine application and verified that I am speaking with the correct person using two identifiers.  Location: Patient:  Virtual Visit Location Patient: Home Provider: Virtual Visit Location Provider: Home   I discussed the limitations of evaluation and management by telemedicine and the availability of in person appointments. The patient expressed understanding and agreed to proceed.    History of Present Illness: Connie Herrera is a 47 y.o. who identifies as a female who was assigned female at birth, and is being seen today for spasms in her bilateral arms. Patient reports history of recent stroke. Patient reports she is having numbness and tingling in her arms. The patient denies injury or trauma. She reports that a stent was placed in her arm and chest due to a blood clot. She reports she can only take Baclofen because she takes Eliquis.   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   Left arm weakness 04/09/2022   Iron deficiency anemia 04/09/2022   Hypokalemia 04/09/2022   Muscle spasms of both lower extremities 08/14/2020   Fibroid uterus 08/14/2020   Anemia due to blood loss, chronic 01/10/2020   Menorrhagia with regular cycle 01/09/2020   Class 1 obesity due to excess calories with body mass index (BMI) of 34.0 to 34.9 in adult 01/09/2020   Screening breast examination 02/07/2019   Breast pain 02/07/2019   Hypertension 01/26/2019   Paresthesias 08/04/2013   Breast pain, right 03/11/2012    Allergies: No Known Allergies Medications:  Current Outpatient Medications:  baclofen (LIORESAL) 10 MG tablet, Take 1 tablet (10 mg total) by mouth 3 (three) times daily., Disp: 30 each, Rfl: 0   amLODipine (NORVASC) 10 MG tablet, Take 1 tablet (10 mg total) by mouth daily., Disp: 90 tablet, Rfl: 3   apixaban (ELIQUIS) 5 MG TABS tablet, Take 1 tablet (5 mg total) by mouth 2 (two) times daily., Disp: 60 tablet, Rfl: 0   APIXABAN (ELIQUIS) VTE STARTER PACK (10MG AND 5MG), Take as  directed on package: start with two-61m tablets twice daily until 04/22/2021. On 04/23/22 switch to one-525mtablet twice daily., Disp: 74 each, Rfl: 0   aspirin EC 81 MG tablet, Take daily at 6 am. Swallow whole., Disp: 30 tablet, Rfl: 0   baclofen (LIORESAL) 10 MG tablet, Take 1 tablet (10 mg total) by mouth 3 (three) times daily., Disp: 30 each, Rfl: 0   docusate sodium (COLACE) 100 MG capsule, Take 1 capsule (100 mg total) by mouth daily. (Patient not taking: Reported on 05/19/2022), Disp: 10 capsule, Rfl: 0   ferrous sulfate 325 (65 FE) MG EC tablet, Take 1 tablet (325 mg total) by mouth 2 (two) times daily. (Patient taking differently: Take 1 tablet by mouth daily with breakfast.), Disp: 60 tablet, Rfl: 3   ibuprofen (ADVIL) 800 MG tablet, Take 800 mg by mouth every 8 (eight) hours as needed. (Patient not taking: Reported on 05/19/2022), Disp: , Rfl:    losartan (COZAAR) 25 MG tablet, Take 1 tablet (25 mg total) by mouth daily., Disp: 90 tablet, Rfl: 3   naloxone (NARCAN) nasal spray 4 mg/0.1 mL, Place 1 spray into the nose as needed. (Patient not taking: Reported on 05/19/2022), Disp: , Rfl:    ondansetron (ZOFRAN) 4 MG tablet, Take 1 tablet (4 mg total) by mouth every 8 (eight) hours as needed for nausea or vomiting. (Patient not taking: Reported on 05/19/2022), Disp: 20 tablet, Rfl: 0   oxyCODONE-acetaminophen (PERCOCET) 10-325 MG tablet, Take 1 tablet by mouth every 8 (eight) hours as needed. (Patient not taking: Reported on 05/19/2022), Disp: , Rfl:    pantoprazole (PROTONIX) 40 MG tablet, Take 1 tablet (40 mg total) by mouth daily., Disp: 30 tablet, Rfl: 2   prochlorperazine (COMPAZINE) 10 MG tablet, Take 1 tablet (10 mg total) by mouth every 8 (eight) hours as needed (nausea or headache). (Patient not taking: Reported on 05/19/2022), Disp: 20 tablet, Rfl: 0   rosuvastatin (CRESTOR) 10 MG tablet, Take 1 tablet (10 mg total) by mouth daily., Disp: 30 tablet, Rfl: 0   traMADol (ULTRAM) 50 MG tablet,  Take 1 tablet (50 mg total) by mouth every 6 (six) hours as needed for up to 12 doses. (Patient not taking: Reported on 05/19/2022), Disp: 12 tablet, Rfl: 0   Vitamin D, Ergocalciferol, (DRISDOL) 1.25 MG (50000 UNIT) CAPS capsule, Take 50,000 Units by mouth once a week., Disp: , Rfl:   Observations/Objective: Patient is well-developed, well-nourished in no acute distress.  Resting comfortably  at home.  Head is normocephalic, atraumatic.  No labored breathing.  Speech is clear and coherent with logical content.  Patient is alert and oriented at baseline.    Assessment and Plan: 1. Muscle spasm - baclofen (LIORESAL) 10 MG tablet; Take 1 tablet (10 mg total) by mouth 3 (three) times daily.  Dispense: 30 each; Refill: 0  Patiient with complaint of spasm to her bilateral upper extremities. Recent history of CVA with stent placement, currently on blood thinners. Baclofen 1094mID was prescribed. Patient advised to follow up  with her upcoming scheduled appointments. Patient advised to follow-up with vascular if symptoms do not improve with medication, or follow-up with PCP. Patient is in agreement with this plan of care and verbalizes understanding. All questions were answered.   Follow Up Instructions: I discussed the assessment and treatment plan with the patient. The patient was provided an opportunity to ask questions and all were answered. The patient agreed with the plan and demonstrated an understanding of the instructions.  A copy of instructions were sent to the patient via MyChart unless otherwise noted below.     The patient was advised to call back or seek an in-person evaluation if the symptoms worsen or if the condition fails to improve as anticipated.  Time:  I spent 10  minutes with the patient via telehealth technology discussing the above problems/concerns.    Tish Men, NP

## 2022-05-21 NOTE — Patient Instructions (Signed)
Connie Herrera, thank you for joining Connie Men, NP for today's virtual visit.  While this provider is not your primary care provider (PCP), if your PCP is located in our provider database this encounter information will be shared with them immediately following your visit.   [place] account gives you access to today's visit and all your visits, tests, and labs performed at [facility] " click here if you don't have a [facility] account or go to [email or web address]  Consent: (Patient) Connie Herrera provided verbal consent for this virtual visit at the beginning of the encounter.  Current Medications:  Current Outpatient Medications:    baclofen (LIORESAL) 10 MG tablet, Take 1 tablet (10 mg total) by mouth 3 (three) times daily., Disp: 30 each, Rfl: 0   amLODipine (NORVASC) 10 MG tablet, Take 1 tablet (10 mg total) by mouth daily., Disp: 90 tablet, Rfl: 3   apixaban (ELIQUIS) 5 MG TABS tablet, Take 1 tablet (5 mg total) by mouth 2 (two) times daily., Disp: 60 tablet, Rfl: 0   APIXABAN (ELIQUIS) VTE STARTER PACK (10MG AND 5MG), Take as directed on package: start with two-82m tablets twice daily until 04/22/2021. On 04/23/22 switch to one-535mtablet twice daily., Disp: 74 each, Rfl: 0   aspirin EC 81 MG tablet, Take daily at 6 am. Swallow whole., Disp: 30 tablet, Rfl: 0   baclofen (LIORESAL) 10 MG tablet, Take 1 tablet (10 mg total) by mouth 3 (three) times daily., Disp: 30 each, Rfl: 0   docusate sodium (COLACE) 100 MG capsule, Take 1 capsule (100 mg total) by mouth daily. (Patient not taking: Reported on 05/19/2022), Disp: 10 capsule, Rfl: 0   ferrous sulfate 325 (65 FE) MG EC tablet, Take 1 tablet (325 mg total) by mouth 2 (two) times daily. (Patient taking differently: Take 1 tablet by mouth daily with breakfast.), Disp: 60 tablet, Rfl: 3   ibuprofen (ADVIL) 800 MG tablet, Take 800 mg by mouth every 8 (eight) hours as needed.  (Patient not taking: Reported on 05/19/2022), Disp: , Rfl:    losartan (COZAAR) 25 MG tablet, Take 1 tablet (25 mg total) by mouth daily., Disp: 90 tablet, Rfl: 3   naloxone (NARCAN) nasal spray 4 mg/0.1 mL, Place 1 spray into the nose as needed. (Patient not taking: Reported on 05/19/2022), Disp: , Rfl:    ondansetron (ZOFRAN) 4 MG tablet, Take 1 tablet (4 mg total) by mouth every 8 (eight) hours as needed for nausea or vomiting. (Patient not taking: Reported on 05/19/2022), Disp: 20 tablet, Rfl: 0   oxyCODONE-acetaminophen (PERCOCET) 10-325 MG tablet, Take 1 tablet by mouth every 8 (eight) hours as needed. (Patient not taking: Reported on 05/19/2022), Disp: , Rfl:    pantoprazole (PROTONIX) 40 MG tablet, Take 1 tablet (40 mg total) by mouth daily., Disp: 30 tablet, Rfl: 2   prochlorperazine (COMPAZINE) 10 MG tablet, Take 1 tablet (10 mg total) by mouth every 8 (eight) hours as needed (nausea or headache). (Patient not taking: Reported on 05/19/2022), Disp: 20 tablet, Rfl: 0   rosuvastatin (CRESTOR) 10 MG tablet, Take 1 tablet (10 mg total) by mouth daily., Disp: 30 tablet, Rfl: 0   traMADol (ULTRAM) 50 MG tablet, Take 1 tablet (50 mg total) by mouth every 6 (six) hours as needed for up to 12 doses. (Patient not taking: Reported on 05/19/2022), Disp: 12 tablet, Rfl: 0   Vitamin D, Ergocalciferol, (DRISDOL) 1.25 MG (50000 UNIT) CAPS capsule, Take 50,000 Units by  mouth once a week., Disp: , Rfl:    Medications ordered in this encounter:  Meds ordered this encounter  Medications   baclofen (LIORESAL) 10 MG tablet    Sig: Take 1 tablet (10 mg total) by mouth 3 (three) times daily.    Dispense:  30 each    Refill:  0     *If you need refills on other medications prior to your next appointment, please contact your pharmacy*  Follow-Up: Call back or seek an in-person evaluation if the symptoms worsen or if the condition fails to improve as anticipated.  [facility] [phone]  Other  Instructions Take medication as prescribed. Continue current medications. Follow up with specialists as scheduled. If symptoms fail to improve, recommend following up with vascular to ensure no procedural complications.  Go to the emergency department if symptoms worsen.     If you have been instructed to have an in-person evaluation today at a local Urgent Care facility, please use the link below. It will take you to a list of all of our available [facility], including address, phone number and hours of operation. Please do not delay care.  [facility]  If you or a family member do not have a primary care provider, use the link below to schedule a visit and establish care. When you choose a [facility] primary care physician or advanced practice provider, you gain a long-term partner in health. Find a Primary Care Provider  Learn more about [facility]'s in-office and virtual care options: [facility]

## 2022-05-21 NOTE — Telephone Encounter (Signed)
[employer] insurance plan has denied our request for a CTA chest, Right upper extremity, and Echo.  After the fact, they said they did not see how these studies could help the patient.  Pt has limited notes with our office.  She has an appt to see Dr. Virl Cagey on 02/23.  They didn't offer a Peer to Peer appt with a Market researcher.

## 2022-05-22 ENCOUNTER — Other Ambulatory Visit: Payer: Self-pay

## 2022-05-22 DIAGNOSIS — I749 Embolism and thrombosis of unspecified artery: Secondary | ICD-10-CM

## 2022-05-25 NOTE — Progress Notes (Unsigned)
Patient ID: Connie Herrera, female    DOB: 1975-12-24  MRN: [id number]  CC: Emergency Department Follow-Up  Subjective: Connie Herrera is a 47 y.o. female who presents for emergency department follow-up.   Her concerns today include:  05/12/2022 [facility] Department at [facility] per MD note:                 Medical Decision Making Amount and/or Complexity of Data Reviewed Labs: ordered.     Medical Decision Making / ED Course     This patient presents to the ED for concern of palpitations, this involves an extensive number of treatment options, and is a complaint that carries with it a high risk of complications and morbidity.  The differential diagnosis includes A-fib, PVCs, ACS, other arrhythmia   MDM: 47 year old female presents today for evaluation of above-mentioned complaints.  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  She states she has been doing relatively well until today when she noticed mild shortness of breath, fatigue.  Overall she is well-appearing.  No acute distress.  Workup revealed CBC without leukocytosis, hemoglobin at 10.6 which is around her baseline.  BMP which is unremarkable.  Respiratory panel negative.  Chest x-ray without acute cardiopulmonary process.  Troponin of 3.  Which is similar to what it was a couple weeks ago.  EKG without acute ischemic changes but does show multiple PVCs.  Patient was maintained on telemetry which showed occasional PVCs.  Low suspicion for ACS.  Palpitations likely due to PVCs.  Patient does drink a lot of caffeine.  This could be her trigger.  We discussed cutting back on caffeine.  Discussed following up with her cardiologist.  We discussed repeat troponin however after shared decision making patient defers this.  Patient is appropriate for discharge.  Discharged in stable condition.  Return precaution discussed.  Patient voices understanding and is in agreement with plan.   Patient did have recent TSH at the admission 1 month ago which was normal.   Patient after being discharged requested to get her second troponin and a liter of fluids.  Second troponin was obtained which is normal without any increase.  Liter of fluid provided.  Patient is stable for discharge.  Discussed follow-up with cardiology.   Lab Tests: -I ordered, reviewed, and interpreted labs.   The pertinent results include:        Labs Reviewed  CBC WITH DIFFERENTIAL/PLATELET - Abnormal; Notable for the following components:      Result Value     Hemoglobin 10.6 (*)      HCT 34.2 (*)      MCV 76.7 (*)      MCH 23.8 (*)      RDW 19.6 (*)      Platelets 408 (*)      All other components within normal limits  RESP PANEL BY RT-PCR (RSV, FLU A&B, COVID)  [id number]  BASIC METABOLIC PANEL  TROPONIN I (HIGH SENSITIVITY)        EKG   EKG Interpretation   Date/Time:             Ventricular Rate:     PR Interval:                  QRS Duration:          QT Interval:                  QTC Calculation:  R Axis:               Text Interpretation:                  Imaging Studies ordered: I ordered imaging studies including cxr I independently visualized and interpreted imaging. I agree with the radiologist interpretation     Medicines ordered and prescription drug management: No orders of the defined types were placed in this encounter.   -I have reviewed the patients home medicines and have made adjustments as needed     Cardiac Monitoring: The patient was maintained on a cardiac monitor.  I personally viewed and interpreted the cardiac monitored which showed an underlying rhythm of: Normal sinus rhythm with occasional PVCs   Dispostion: Patient is appropriate for discharge.  Discharged in stable condition.  Return precaution discussed.  Admission considered however no concerning findings on workup.  No concerning arrhythmia on telemetry.  05/18/2022 [facility] at [facility] per MD note:  Medical Decision Making   This patient presents to the ED for concern of shortness of breath and palpitations differential diagnosis includes arrhythmia, hypokalemia, anemia, ischemia, it is reassuring that the patient has been on her Eliquis and taking it as prescribed       Additional history obtained:   Additional history obtained from electronic medical record External records from outside source obtained and reviewed including prior hospitalization with extensive workup and imaging tests including echocardiogram     Lab Tests:   I Ordered, and personally interpreted labs.  The pertinent results include: CBC and metabolic panel which are overall unremarkable     Imaging Studies ordered:   I ordered imaging studies including chest x-ray I independently visualized and interpreted imaging which showed no signs of pneumonia I agree with the radiologist interpretation     Medicines ordered and prescription drug management:     I have reviewed the patients home medicines and have made adjustments as needed     Problem List / ED Course:   No symptoms while in the emergency department     Social Determinants of Health:   The patient is cleared from a cardiac standpoint this evening as there is no signs of arrhythmia here.  She does not have chest pain or classic anginal symptoms.  The patient has a follow-up in the morning for a transesophageal echocardiogram with cardiology.  I think it is reasonable for her to be discharged to follow-up in that setting.  No need for inpatient admission at this time  05/19/2022 [place] [facility] per MD note:  ASSESSMENT:     Palpitations: correct iron deficiency anemia   CVA: LDL 68 mg/dL, at goal. BP is a contributing factor. Will work on this. Continue eliquis. Will perform TEE to r/o PFO; ensure no other embolic source   [id number] 6.0%   HTN: elevated 140/100  mmHg.    HLD: continue crestor 10 mg daily PLAN:     In order of problems listed above:     Increase norvasc to 5 mg to 10 mg Add losartan 25 mg daily TEE to look for embolic source Follow up in 3 months        Shared Decision Making/Informed Consent The risks [esophageal damage, perforation (1:10,000 risk), bleeding, pharyngeal hematoma as well as other potential complications associated with conscious sedation including aspiration, arrhythmia, respiratory failure and death], benefits (treatment guidance and diagnostic support) and alternatives of a transesophageal echocardiogram were  discussed in detail with Ms. Sharlett Iles and she is willing to proceed.       Medication Adjustments/Labs and Tests Ordered: Current medicines are reviewed at length with the patient today.  Concerns regarding medicines are outlined above.  No orders of the defined types were placed in this encounter.       Meds ordered this encounter  Medications   amLODipine (NORVASC) 10 MG tablet      Sig: Take 1 tablet (10 mg total) by mouth daily.      Dispense:  90 tablet      Refill:  3   losartan (COZAAR) 25 MG tablet      Sig: Take 1 tablet (25 mg total) by mouth daily.      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   Left arm weakness 04/09/2022   Iron deficiency anemia 04/09/2022   Hypokalemia 04/09/2022   Muscle spasms of both lower extremities 08/14/2020   Fibroid uterus 08/14/2020   Anemia due to blood loss, chronic 01/10/2020   Menorrhagia with regular cycle  01/09/2020   Class 1 obesity due to excess calories with body mass index (BMI) of 34.0 to 34.9 in adult 01/09/2020   Screening breast examination 02/07/2019   Breast pain 02/07/2019   Hypertension 01/26/2019   Paresthesias 08/04/2013   Breast pain, right 03/11/2012     Current Outpatient Medications on File Prior to Visit  Medication Sig Dispense Refill   amLODipine (NORVASC) 10 MG tablet Take 1 tablet (10 mg total) by mouth daily. 90 tablet 3   apixaban (ELIQUIS) 5 MG TABS tablet Take 1 tablet (5 mg total) by mouth 2 (two) times daily. 60 tablet 0   APIXABAN (ELIQUIS) VTE STARTER PACK (10MG AND 5MG) Take as directed on package: start with two-55m tablets twice daily until 04/22/2021. On 04/23/22 switch to one-561mtablet twice daily. 74 each 0   aspirin EC 81 MG tablet Take daily at 6 am. Swallow whole. 30 tablet 0   baclofen (LIORESAL) 10 MG tablet Take 1 tablet (10 mg total) by mouth 3 (three) times daily. 30 each 0   baclofen (LIORESAL) 10 MG tablet Take 1 tablet (10 mg total) by mouth 3 (three) times daily. 30 each 0   docusate sodium (COLACE) 100 MG capsule Take 1 capsule (100 mg total) by mouth daily. (Patient not taking: Reported on 05/19/2022) 10 capsule 0   ferrous sulfate 325 (65 FE) MG EC tablet Take 1 tablet (325 mg total) by mouth 2 (two) times daily. (Patient taking differently: Take 1 tablet by mouth daily with breakfast.) 60 tablet 3   ibuprofen (ADVIL) 800 MG tablet Take 800 mg by mouth every 8 (eight) hours as needed. (Patient not taking: Reported on 05/19/2022)     losartan (COZAAR) 25 MG tablet Take 1 tablet (25 mg total) by mouth daily. 90 tablet 3   naloxone (NARCAN) nasal spray 4 mg/0.1 mL Place 1 spray into the nose as needed. (Patient not taking: Reported on 05/19/2022)  ondansetron (ZOFRAN) 4 MG tablet Take 1 tablet (4 mg total) by mouth every 8 (eight) hours as needed for nausea or vomiting. (Patient not taking: Reported on 05/19/2022) 20 tablet 0    oxyCODONE-acetaminophen (PERCOCET) 10-325 MG tablet Take 1 tablet by mouth every 8 (eight) hours as needed. (Patient not taking: Reported on 05/19/2022)     pantoprazole (PROTONIX) 40 MG tablet Take 1 tablet (40 mg total) by mouth daily. 30 tablet 2   prochlorperazine (COMPAZINE) 10 MG tablet Take 1 tablet (10 mg total) by mouth every 8 (eight) hours as needed (nausea or headache). (Patient not taking: Reported on 05/19/2022) 20 tablet 0   rosuvastatin (CRESTOR) 10 MG tablet Take 1 tablet (10 mg total) by mouth daily. 30 tablet 0   traMADol (ULTRAM) 50 MG tablet Take 1 tablet (50 mg total) by mouth every 6 (six) hours as needed for up to 12 doses. (Patient not taking: Reported on 05/19/2022) 12 tablet 0   Vitamin D, Ergocalciferol, (DRISDOL) 1.25 MG (50000 UNIT) CAPS capsule Take 50,000 Units by mouth once a week.     [DISCONTINUED] hydrochlorothiazide (HYDRODIURIL) 25 MG tablet Take 1 tablet (25 mg total) by mouth daily. Must have office visit for refills 30 tablet 0   No current facility-administered medications on file prior to visit.    No Known Allergies  Social History   Socioeconomic History   Marital status: Single    Spouse name: Not on file   Number of children: 4   Years of education: 14   Highest education level: Some college, no degree  Occupational History   Occupation: Dietary Supervisor    Comment: [employer]  Tobacco Use   Smoking status: Former    Packs/day: 0.50    Years: 4.00    Total pack years: 2.00    Types: Cigarettes    Quit date: 06/04/2017    Years since quitting: 4.9   Smokeless tobacco: Never  Vaping Use   Vaping Use: Never used  Substance and Sexual Activity   Alcohol use: No   Drug use: Not Currently    Types: Marijuana    Comment: 09/30/20 maybe once a week   Sexual activity: Yes    Birth control/protection: Surgical  Other Topics Concern   Not on file  Social History Narrative   Patient lives at home with children.    Patient has 4  children.    Patient is single.    Patient has 2 years of college.    Patient is right handed.    Patient is working full time. [employer].    Social Determinants of Health   Financial Resource Strain: Not on file  Food Insecurity: No Food Insecurity (04/10/2022)   Hunger Vital Sign    Worried About Running Out of Food in the Last Year: Never true    Ran Out of Food in the Last Year: Never true  Transportation Needs: No Transportation Needs (04/10/2022)   PRAPARE - Hydrologist (Medical): No    Lack of Transportation (Non-Medical): No  Physical Activity: Not on file  Stress: Not on file  Social Connections: Not on file  Intimate Partner Violence: Not At Risk (04/10/2022)   Humiliation, Afraid, Rape, and Kick questionnaire    Fear of Current or Ex-Partner: No    Emotionally Abused: No    Physically Abused: No    Sexually Abused: No    Family History  Problem Relation Age of Onset  Healthy Mother    Cirrhosis Father    Cancer Maternal Grandmother 76       breast   Breast cancer Maternal Grandmother     Past Surgical History:  Procedure Laterality Date   AORTOGRAM N/A 04/14/2022   Procedure: DIAGNOSTIC AORTOGRAM;  Surgeon: Broadus John, MD;  Location: [facility] OR;  Service: Vascular;  Laterality: N/A;   ENDARTERECTOMY Right 04/14/2022   Procedure: CAROTID CUTDOWN WITH BRACHIOCEPHALIC STENTING, WITH ULTRASOUND OF THE RIGHT FEMORAL VEIN;  Surgeon: Broadus John, MD;  Location: [facility];  Service: Vascular;  Laterality: Right;   TUBAL LIGATION      ROS: Review of Systems Negative except as stated above  PHYSICAL EXAM: LMP 05/18/2022 (Exact Date)   Physical Exam  {female adult master:310786} {female adult master:310785}     Latest Ref Rng & Units 05/18/2022    7:35 PM 05/12/2022    3:23 PM 04/23/2022    4:04 PM  CMP  Glucose 70 - 99 mg/dL 105  99  134   BUN 6 - 20 mg/dL 12  9  10   $ Creatinine 0.44 - 1.00 mg/dL 1.46  0.75  0.70   Sodium  135 - 145 mmol/L 137  136  140   Potassium 3.5 - 5.1 mmol/L 3.6  3.5  3.3   Chloride 98 - 111 mmol/L 103  102  102   CO2 22 - 32 mmol/L 25  26    Calcium 8.9 - 10.3 mg/dL 8.7  9.2     Lipid Panel     Component Value Date/Time   CHOL 138 04/10/2022 0443   CHOL 138 03/07/2019 0831   TRIG 171 (H) 04/10/2022 0443   HDL 36 (L) 04/10/2022 0443   HDL 30 (L) 03/07/2019 0831   CHOLHDL 3.8 04/10/2022 0443   VLDL 34 04/10/2022 0443   LDLCALC 68 04/10/2022 0443   LDLCALC 87 03/07/2019 0831    CBC    Component Value Date/Time   WBC 6.1 05/18/2022 1935   RBC 4.37 05/18/2022 1935   HGB 10.6 (L) 05/18/2022 1935   HGB 10.6 (L) 01/09/2020 1351   HCT 34.5 (L) 05/18/2022 1935   HCT 32.8 (L) 01/09/2020 1351   PLT 436 (H) 05/18/2022 1935   PLT 436 01/09/2020 1351   MCV 78.9 (L) 05/18/2022 1935   MCV 82 01/09/2020 1351   MCH 24.3 (L) 05/18/2022 1935   MCHC 30.7 05/18/2022 1935   RDW 18.7 (H) 05/18/2022 1935   RDW 13.8 01/09/2020 1351   LYMPHSABS 1.9 05/18/2022 1935   MONOABS 0.5 05/18/2022 1935   EOSABS 0.1 05/18/2022 1935   BASOSABS 0.0 05/18/2022 1935    ASSESSMENT AND PLAN:  There are no diagnoses linked to this encounter.   Patient was given the opportunity to ask questions.  Patient verbalized understanding of the plan and was able to repeat key elements of the plan. Patient was given clear instructions to go to Emergency Department or return to medical center if symptoms don't improve, worsen, or new problems develop.The patient verbalized understanding.   No orders of the defined types were placed in this encounter.    Requested Prescriptions    No prescriptions requested or ordered in this encounter    No follow-ups on file.  Camillia Herter, NP

## 2022-05-26 ENCOUNTER — Ambulatory Visit (HOSPITAL_BASED_OUTPATIENT_CLINIC_OR_DEPARTMENT_OTHER)
Admission: RE | Admit: 2022-05-26 | Discharge: 2022-05-26 | Disposition: A | Discharge status: Home / Self Care | Payer: BLUE CROSS/BLUE SHIELD | Source: Ambulatory Visit | Attending: Vascular Surgery | Admitting: Vascular Surgery

## 2022-05-26 DIAGNOSIS — I749 Embolism and thrombosis of unspecified artery: Secondary | ICD-10-CM | POA: Diagnosis not present

## 2022-05-26 MED ORDER — IOHEXOL 300 MG/ML  SOLN
80.0000 mL | Freq: Once | INTRAMUSCULAR | Status: AC | PRN
  Administered 2022-05-26: 80 mL via INTRAVENOUS

## 2022-05-27 ENCOUNTER — Other Ambulatory Visit (HOSPITAL_COMMUNITY): Payer: Self-pay

## 2022-05-27 ENCOUNTER — Ambulatory Visit: Payer: Medicaid Other | Attending: Nurse Practitioner | Admitting: Nurse Practitioner

## 2022-05-27 ENCOUNTER — Encounter: Payer: Self-pay | Admitting: Nurse Practitioner

## 2022-05-27 ENCOUNTER — Encounter: Payer: Self-pay | Admitting: Family

## 2022-05-27 ENCOUNTER — Encounter: Payer: Medicaid Other | Admitting: Family

## 2022-05-27 ENCOUNTER — Ambulatory Visit: Payer: Medicaid Other | Admitting: Family

## 2022-05-27 VITALS — BP 121/80 | HR 89 | Temp 98.3°F | Resp 16 | Ht 65.98 in | Wt 218.0 lb

## 2022-05-27 VITALS — BP 121/80 | HR 95 | Ht 65.9 in | Wt 230.6 lb

## 2022-05-27 DIAGNOSIS — I639 Cerebral infarction, unspecified: Secondary | ICD-10-CM

## 2022-05-27 DIAGNOSIS — I10 Essential (primary) hypertension: Secondary | ICD-10-CM

## 2022-05-27 DIAGNOSIS — Z1231 Encounter for screening mammogram for malignant neoplasm of breast: Secondary | ICD-10-CM

## 2022-05-27 DIAGNOSIS — L853 Xerosis cutis: Secondary | ICD-10-CM

## 2022-05-27 DIAGNOSIS — R29898 Other symptoms and signs involving the musculoskeletal system: Secondary | ICD-10-CM

## 2022-05-27 DIAGNOSIS — I493 Ventricular premature depolarization: Secondary | ICD-10-CM | POA: Diagnosis not present

## 2022-05-27 DIAGNOSIS — L299 Pruritus, unspecified: Secondary | ICD-10-CM

## 2022-05-27 DIAGNOSIS — R5383 Other fatigue: Secondary | ICD-10-CM

## 2022-05-27 DIAGNOSIS — R002 Palpitations: Secondary | ICD-10-CM

## 2022-05-27 DIAGNOSIS — I749 Embolism and thrombosis of unspecified artery: Secondary | ICD-10-CM

## 2022-05-27 MED ORDER — TRIAMCINOLONE ACETONIDE 0.5 % EX OINT: 1.0000 | TOPICAL_OINTMENT | Freq: Two times a day (BID) | CUTANEOUS | 2 refills | Status: DC

## 2022-05-27 NOTE — Progress Notes (Signed)
Patient informed that appt today to establish care would not be covered under her insurance as she has already seen her former PCP today.

## 2022-05-27 NOTE — Progress Notes (Signed)
Pt presents for emergency room follow-up  -pt request triamcinolone ointment sent into pharmacy

## 2022-05-27 NOTE — Progress Notes (Signed)
Patient states that she is having spasms around the area where she had her stint put in.   Elevation of BP caused stroke, worried about controlling BP.

## 2022-05-29 ENCOUNTER — Ambulatory Visit (INDEPENDENT_AMBULATORY_CARE_PROVIDER_SITE_OTHER): Payer: BLUE CROSS/BLUE SHIELD | Admitting: Vascular Surgery

## 2022-05-29 ENCOUNTER — Encounter: Payer: Self-pay | Admitting: Vascular Surgery

## 2022-05-29 ENCOUNTER — Ambulatory Visit (HOSPITAL_COMMUNITY)
Admission: EM | Admit: 2022-05-29 | Discharge: 2022-05-29 | Disposition: A | Discharge status: Home / Self Care | Payer: BLUE CROSS/BLUE SHIELD | Attending: Emergency Medicine | Admitting: Emergency Medicine

## 2022-05-29 ENCOUNTER — Ambulatory Visit: Payer: Medicaid Other | Admitting: Family

## 2022-05-29 ENCOUNTER — Encounter (HOSPITAL_COMMUNITY): Payer: Self-pay

## 2022-05-29 VITALS — BP 158/101 | HR 95 | Temp 98.1°F | Resp 20 | Ht 65.0 in | Wt 229.0 lb

## 2022-05-29 DIAGNOSIS — D500 Iron deficiency anemia secondary to blood loss (chronic): Secondary | ICD-10-CM | POA: Insufficient documentation

## 2022-05-29 DIAGNOSIS — Z711 Person with feared health complaint in whom no diagnosis is made: Secondary | ICD-10-CM | POA: Diagnosis present

## 2022-05-29 DIAGNOSIS — R002 Palpitations: Secondary | ICD-10-CM | POA: Insufficient documentation

## 2022-05-29 DIAGNOSIS — Z959 Presence of cardiac and vascular implant and graft, unspecified: Secondary | ICD-10-CM

## 2022-05-29 DIAGNOSIS — I10 Essential (primary) hypertension: Secondary | ICD-10-CM | POA: Insufficient documentation

## 2022-05-29 DIAGNOSIS — N920 Excessive and frequent menstruation with regular cycle: Secondary | ICD-10-CM | POA: Insufficient documentation

## 2022-05-29 DIAGNOSIS — R109 Unspecified abdominal pain: Secondary | ICD-10-CM | POA: Diagnosis not present

## 2022-05-29 DIAGNOSIS — N898 Other specified noninflammatory disorders of vagina: Secondary | ICD-10-CM | POA: Diagnosis present

## 2022-05-29 DIAGNOSIS — Z113 Encounter for screening for infections with a predominantly sexual mode of transmission: Secondary | ICD-10-CM | POA: Diagnosis not present

## 2022-05-29 LAB — POCT URINALYSIS DIPSTICK, ED / UC
Bilirubin Urine: NEGATIVE
Glucose, UA: NEGATIVE mg/dL
Hgb urine dipstick: NEGATIVE
Ketones, ur: NEGATIVE mg/dL
Leukocytes,Ua: NEGATIVE
Nitrite: NEGATIVE
Protein, ur: NEGATIVE mg/dL
Specific Gravity, Urine: 1.025 (ref 1.005–1.030)
Urobilinogen, UA: 0.2 mg/dL (ref 0.0–1.0)
pH: 6 (ref 5.0–8.0)

## 2022-05-29 LAB — POC URINE PREG, ED: Preg Test, Ur: NEGATIVE

## 2022-05-29 NOTE — ED Provider Notes (Signed)
Connie Herrera    CSN: [id number] Arrival date & time: 05/29/22  1215      History   Chief Complaint Chief Complaint  Patient presents with   Abdominal Pain    HPI FLODA Herrera is a 47 y.o. female.   Reports clear white discharge with a 'little odor,' denies itching. Reports suprapubic pressure, concerned for bladder infection. Denies dysuria or hematuria.  She reports she switched soaps recently, was trying to get all the residue off of her skin from previous hospital admission and EKG lead residue off. She is currently using Goat milk soap and scented concerned for BV. Denies pain with intercourse, requesting STI screening. Negative RPR and HIV testing on admission in January.   Also reports intermittent palpitations, was told these were due to caffeine. She had some in the car after her follow-up with [facility] today. Has not had any caffeine today, drinks sprite. Denies CP or SOB. Denies fever.   Has a large uterine fibroid that is scheduled for removal in March.   The history is provided by the patient.  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   Left arm weakness 04/09/2022   Iron deficiency anemia 04/09/2022   Hypokalemia 04/09/2022   Muscle spasms of both lower extremities 08/14/2020   Fibroid uterus 08/14/2020   Anemia due to blood loss, chronic 01/10/2020   Menorrhagia with regular cycle 01/09/2020   Class 1 obesity  due to excess calories with body mass index (BMI) of 34.0 to 34.9 in adult 01/09/2020   Screening breast examination 02/07/2019   Breast pain 02/07/2019   Hypertension 01/26/2019   Paresthesias 08/04/2013   Breast pain, right 03/11/2012    Past Surgical History:  Procedure Laterality Date   AORTOGRAM N/A 04/14/2022   Procedure: DIAGNOSTIC AORTOGRAM;  Surgeon: Broadus John, MD;  Location: [facility] OR;  Service: Vascular;  Laterality: N/A;   ENDARTERECTOMY Right 04/14/2022   Procedure: CAROTID CUTDOWN WITH BRACHIOCEPHALIC STENTING, WITH ULTRASOUND OF THE RIGHT FEMORAL VEIN;  Surgeon: Broadus John, MD;  Location: [facility] OR;  Service: Vascular;  Laterality: Right;   TUBAL LIGATION      OB History     Gravida  4   Para  4   Term  4   Preterm  0   AB  0   Living  4      SAB  0   IAB  0   Ectopic  0   Multiple  0   Live Births  4            Home Medications    Prior to Admission medications   Medication Sig Start Date End Date Taking? Authorizing Provider  amLODipine (NORVASC) 10 MG tablet Take 1 tablet (10 mg total) by mouth daily. 05/19/22  Yes BranchRoyetta Crochet, MD  apixaban (ELIQUIS) 5 MG TABS tablet Take 1 tablet (5 mg total) by mouth  2 (two) times daily. 05/19/22 06/18/22 Yes Camillia Herter, NP  aspirin EC 81 MG tablet Take daily at 6 am. Swallow whole. 05/19/22  Yes Minette Brine, Amy J, NP  baclofen (LIORESAL) 10 MG tablet Take 1 tablet (10 mg total) by mouth 3 (three) times daily. 02/25/22  Yes Pattricia Boss, MD  ferrous sulfate 325 (65 FE) MG EC tablet Take 1 tablet (325 mg total) by mouth 2 (two) times daily. Patient taking differently: Take 1 tablet by mouth daily with breakfast. 01/08/22  Yes Iruku, Arletha Pili, MD  losartan (COZAAR) 25 MG tablet Take 1 tablet (25 mg total) by mouth daily. 05/19/22  Yes Janina Mayo, MD  ondansetron (ZOFRAN) 4 MG tablet Take 1 tablet (4 mg total) by mouth every 8 (eight) hours as needed for nausea or vomiting. 04/17/22  Yes Kc, Maren Beach, MD   oxyCODONE-acetaminophen (PERCOCET) 10-325 MG tablet Take 1 tablet by mouth every 8 (eight) hours as needed. 05/07/22  Yes [provider]  pantoprazole (PROTONIX) 40 MG tablet Take 1 tablet (40 mg total) by mouth daily. 05/19/22  Yes Minette Brine, Amy J, NP  rosuvastatin (CRESTOR) 10 MG tablet Take 1 tablet (10 mg total) by mouth daily. 05/19/22 06/18/22 Yes Minette Brine, Amy J, NP  triamcinolone ointment (KENALOG) 0.5 % Apply 1 Application topically 2 (two) times daily. 05/27/22  Yes Minette Brine, Amy J, NP  Vitamin D, Ergocalciferol, (DRISDOL) 1.25 MG (50000 UNIT) CAPS capsule Take 50,000 Units by mouth once a week. 05/07/22  Yes [provider]  ibuprofen (ADVIL) 800 MG tablet Take 800 mg by mouth every 8 (eight) hours as needed. 05/15/22   [provider]  naloxone [facility]) nasal spray 4 mg/0.1 mL Place 1 spray into the nose as needed. Patient not taking: Reported on 05/19/2022 04/30/22   [provider]  hydrochlorothiazide (HYDRODIURIL) 25 MG tablet Take 1 tablet (25 mg total) by mouth daily. Must have office visit for refills 06/20/20 08/13/20  Brunetta Jeans, PA-C    Family History Family History  Problem Relation Age of Onset   Healthy Mother    Cirrhosis Father    Cancer Maternal Grandmother 70       breast   Breast cancer Maternal Grandmother     Social History Social History   Tobacco Use   Smoking status: Former    Packs/day: 0.50    Years: 4.00    Total pack years: 2.00    Types: Cigarettes    Quit date: 06/04/2017    Years since quitting: 4.9    Passive exposure: Past   Smokeless tobacco: Never  Vaping Use   Vaping Use: Never used  Substance Use Topics   Alcohol use: No   Drug use: Not Currently    Types: Marijuana    Comment: 09/30/20 maybe once a week     Allergies   Patient has no known allergies.   Review of Systems Review of Systems  Constitutional:  Negative for chills and fever.  HENT:  Negative for sore throat.   Respiratory:   Negative for cough and shortness of breath.   Cardiovascular:  Positive for palpitations. Negative for chest pain.  Gastrointestinal:  Positive for abdominal pain. Negative for diarrhea and vomiting.  Genitourinary:  Positive for vaginal discharge. Negative for dysuria, flank pain, hematuria, pelvic pain and vaginal bleeding.  Musculoskeletal:  Negative for arthralgias and back pain.  Skin:  Negative for color change and rash.  Neurological:  Negative for seizures and syncope.  All other systems reviewed  and are negative.    Physical Exam Triage Vital Signs ED Triage Vitals  Enc Vitals Group     BP 05/29/22 1418 (!) 131/92     Pulse Rate 05/29/22 1418 98     Resp 05/29/22 1418 16     Temp 05/29/22 1418 98.6 F (37 C)     Temp Source 05/29/22 1418 Oral     SpO2 05/29/22 1418 98 %     Weight 05/29/22 1418 230 lb (104.3 kg)     Height 05/29/22 1418 '5\' 7"'$  (1.702 m)     Head Circumference --      Peak Flow --      Pain Score 05/29/22 1415 7     Pain Loc --      Pain Edu? --      Excl. in [place]? --    No data found.  Updated Vital Signs BP (!) 131/92 (BP Location: Left Arm)   Pulse 98   Temp 98.6 F (37 C) (Oral)   Resp 16   Ht '5\' 7"'$  (1.702 m)   Wt 230 lb (104.3 kg)   LMP 05/18/2022 (Exact Date)   SpO2 98%   BMI 36.02 kg/m   Visual Acuity Right Eye Distance:   Left Eye Distance:   Bilateral Distance:    Right Eye Near:   Left Eye Near:    Bilateral Near:     Physical Exam Vitals and nursing note reviewed.  Constitutional:      General: She is not in acute distress.    Appearance: She is well-developed.     Comments: Pleasant 47 year old female who appears stated age.  HENT:     Head: Normocephalic and atraumatic.  Eyes:     Conjunctiva/sclera: Conjunctivae normal.  Cardiovascular:     Rate and Rhythm: Normal rate and regular rhythm.     Heart sounds: Normal heart sounds, S1 normal and S2 normal. No murmur heard. Pulmonary:     Effort: Pulmonary effort is  normal. No respiratory distress.     Breath sounds: Normal breath sounds.     Comments: Lungs vesicular posteriorly. Abdominal:     General: Abdomen is flat. Bowel sounds are normal. There is no distension.     Palpations: Abdomen is soft.     Tenderness: There is abdominal tenderness in the suprapubic area.     Comments: Prepubic tenderness to exam, patient does have large uterine fibroid palpated to left lower quadrant.  Musculoskeletal:        General: No swelling.     Cervical back: Neck supple.  Skin:    General: Skin is warm and dry.     Capillary Refill: Capillary refill takes less than 2 seconds.  Neurological:     Mental Status: She is alert.  Psychiatric:        Mood and Affect: Mood normal.        Behavior: Behavior is cooperative.      UC Treatments / Results  Labs (all labs ordered are listed, but only abnormal results are displayed) Labs Reviewed  POC URINE PREG, ED  POCT URINALYSIS DIPSTICK, ED / UC  CERVICOVAGINAL ANCILLARY ONLY    EKG   Radiology No results found.  Procedures Procedures (including critical care time)  Medications Ordered in UC Medications - No data to display  Initial Impression / Assessment and Plan / UC Course  I have reviewed the triage vital signs and the nursing notes.  Pertinent labs & imaging results that were available  during my care of the patient were reviewed by me and considered in my medical decision making (see chart for details).  Vital signs and triage reviewed.  Patient is hemodynamically stable. EKG obtained due to ongoing palpitations, most recent earlier today, no active chest pain or SOB. Without ST elevation, no STEMI. NSR at 94 bpm, QRS 82 ms, QTC 455. Patient with suprapubic tenderness, palpated large uterine fibroid to left lower quadrant, is scheduled for removal soon.  Reports odor and changes to discharge, vaginal swab obtained.  Declined HIV and syphilis screening, had negative results in January.  Will  call and initiate treatment if indicated.  Plan of care and return precautions discussed, patient verbalized understanding.     Final Clinical Impressions(s) / UC Diagnoses   Final diagnoses:  Palpitations  Vaginal discharge  Concern about sexually transmitted disease in female without diagnosis     Discharge Instructions      Overall your physical exam was reassuring.  Your urinalysis was without signs of urinary tract infection.  Your vaginal swab should result tomorrow and we will call if positive and start treatment.  I have attached some information on palpitations, please keep your follow-up appointments.  Please seek immediate care if you develop chest pain, shortness of breath, or worsening of condition.     ED Prescriptions   None    I have reviewed the PDMP during this encounter.   Shankar Silber, [place], [place] 05/29/22 [phone]

## 2022-05-29 NOTE — ED Triage Notes (Signed)
Chief Complaint: Abdominal pain and vaginal discharge. No current sexual activity. Patient had surgery, stent placement in the heart,  a month ago and has been taking new meds. No urinary symptoms.   Onset: 3 days   Prescriptions or OTC medications tried: No

## 2022-05-29 NOTE — Progress Notes (Signed)
Office Note    HPI: Connie Herrera is a 47 y.o. ([birth year]) female presenting s/p right carotid cutdown innominate artery stenting for acute thrombus.  On exam today, continue was doing well.  She had no complaints.  She denied TIA, stroke, amaurosis.  She did complain of heaviness in her bilateral arms when raising them over her head.  This is not new.  She denies right upper extremity weakness, sensorimotor deficits.  The pt is not on a statin for cholesterol management.  The pt is  on a daily aspirin.   Other AC:  Eliquis The pt is  on medication for hypertension.   The pt is not diabetic.  Tobacco hx:  -  Past Medical History:  Diagnosis Date   Brain lesion    COVID-19 04/2020   CVA (cerebral vascular accident) ([place]) 04/2022   Diplopia    Hypertension    Iron deficiency anemia     Past Surgical History:  Procedure Laterality Date   AORTOGRAM N/A 04/14/2022   Procedure: DIAGNOSTIC AORTOGRAM;  Surgeon: Broadus John, MD;  Location: [facility] OR;  Service: Vascular;  Laterality: N/A;   ENDARTERECTOMY Right 04/14/2022   Procedure: CAROTID CUTDOWN WITH BRACHIOCEPHALIC STENTING, WITH ULTRASOUND OF THE RIGHT FEMORAL VEIN;  Surgeon: Broadus John, MD;  Location: [facility] OR;  Service: Vascular;  Laterality: Right;   TUBAL LIGATION      Social History   Socioeconomic History   Marital status: Single    Spouse name: Not on file   Number of children: 4   Years of education: 14   Highest education level: Some college, no degree  Occupational History   Occupation: Dietary Supervisor    Comment: [employer]  Tobacco Use   Smoking status: Former    Packs/day: 0.50    Years: 4.00    Total pack years: 2.00    Types: Cigarettes    Quit date: 06/04/2017    Years since quitting: 4.9    Passive exposure: Past   Smokeless tobacco: Never  Vaping Use   Vaping Use: Never used  Substance and Sexual Activity   Alcohol use: No   Drug use: Not Currently    Types: Marijuana     Comment: 09/30/20 maybe once a week   Sexual activity: Yes    Birth control/protection: Surgical  Other Topics Concern   Not on file  Social History Narrative   Patient lives at home with children.    Patient has 4 children.    Patient is single.    Patient has 2 years of college.    Patient is right handed.    Patient is working full time. [employer].    Social Determinants of Health   Financial Resource Strain: Not on file  Food Insecurity: No Food Insecurity (04/10/2022)   Hunger Vital Sign    Worried About Running Out of Food in the Last Year: Never true    Ran Out of Food in the Last Year: Never true  Transportation Needs: No Transportation Needs (04/10/2022)   PRAPARE - Hydrologist (Medical): No    Lack of Transportation (Non-Medical): No  Physical Activity: Not on file  Stress: Not on file  Social Connections: Not on file  Intimate Partner Violence: Not At Risk (04/10/2022)   Humiliation, Afraid, Rape, and Kick questionnaire    Fear of Current or Ex-Partner: No    Emotionally Abused: No    Physically Abused: No  Sexually Abused: No   Family History  Problem Relation Age of Onset   Healthy Mother    Cirrhosis Father    Cancer Maternal Grandmother 24       breast   Breast cancer Maternal Grandmother     Current Outpatient Medications  Medication Sig Dispense Refill   amLODipine (NORVASC) 10 MG tablet Take 1 tablet (10 mg total) by mouth daily. 90 tablet 3   apixaban (ELIQUIS) 5 MG TABS tablet Take 1 tablet (5 mg total) by mouth 2 (two) times daily. 60 tablet 0   aspirin EC 81 MG tablet Take daily at 6 am. Swallow whole. 30 tablet 0   baclofen (LIORESAL) 10 MG tablet Take 1 tablet (10 mg total) by mouth 3 (three) times daily. 30 each 0   baclofen (LIORESAL) 10 MG tablet Take 1 tablet (10 mg total) by mouth 3 (three) times daily. 30 each 0   ferrous sulfate 325 (65 FE) MG EC tablet Take 1 tablet (325 mg total) by mouth 2 (two)  times daily. (Patient taking differently: Take 1 tablet by mouth daily with breakfast.) 60 tablet 3   losartan (COZAAR) 25 MG tablet Take 1 tablet (25 mg total) by mouth daily. 90 tablet 3   ondansetron (ZOFRAN) 4 MG tablet Take 1 tablet (4 mg total) by mouth every 8 (eight) hours as needed for nausea or vomiting. 20 tablet 0   oxyCODONE-acetaminophen (PERCOCET) 10-325 MG tablet Take 1 tablet by mouth every 8 (eight) hours as needed.     pantoprazole (PROTONIX) 40 MG tablet Take 1 tablet (40 mg total) by mouth daily. 30 tablet 2   rosuvastatin (CRESTOR) 10 MG tablet Take 1 tablet (10 mg total) by mouth daily. 30 tablet 0   triamcinolone ointment (KENALOG) 0.5 % Apply 1 Application topically 2 (two) times daily. 30 g 2   Vitamin D, Ergocalciferol, (DRISDOL) 1.25 MG (50000 UNIT) CAPS capsule Take 50,000 Units by mouth once a week.     docusate sodium (COLACE) 100 MG capsule Take 1 capsule (100 mg total) by mouth daily. (Patient not taking: Reported on 05/19/2022) 10 capsule 0   ibuprofen (ADVIL) 800 MG tablet Take 800 mg by mouth every 8 (eight) hours as needed. (Patient not taking: Reported on 05/19/2022)     naloxone [facility]) nasal spray 4 mg/0.1 mL Place 1 spray into the nose as needed. (Patient not taking: Reported on 05/19/2022)     prochlorperazine (COMPAZINE) 10 MG tablet Take 1 tablet (10 mg total) by mouth every 8 (eight) hours as needed (nausea or headache). (Patient not taking: Reported on 05/19/2022) 20 tablet 0   traMADol (ULTRAM) 50 MG tablet Take 1 tablet (50 mg total) by mouth every 6 (six) hours as needed for up to 12 doses. (Patient not taking: Reported on 05/19/2022) 12 tablet 0   No current facility-administered medications for this visit.    No Known Allergies   REVIEW OF SYSTEMS:  '[X]'$  denotes positive finding, '[ ]'$  denotes negative finding Cardiac  Comments:  Chest pain or chest pressure:    Shortness of breath upon exertion:    Short of breath when lying flat:    Irregular  heart rhythm:        Vascular    Pain in calf, thigh, or hip brought on by ambulation:    Pain in feet at night that wakes you up from your sleep:     Blood clot in your veins:    Leg swelling:  Pulmonary    Oxygen at home:    Productive cough:     Wheezing:         Neurologic    Sudden weakness in arms or legs:     Sudden numbness in arms or legs:     Sudden onset of difficulty speaking or slurred speech:    Temporary loss of vision in one eye:     Problems with dizziness:         Gastrointestinal    Blood in stool:     Vomited blood:         Genitourinary    Burning when urinating:     Blood in urine:        Psychiatric    Major depression:         Hematologic    Bleeding problems:    Problems with blood clotting too easily:        Skin    Rashes or ulcers:        Constitutional    Fever or chills:      PHYSICAL EXAMINATION:  Vitals:   05/29/22 1001 05/29/22 1027  BP: (!) 148/85 (!) 158/101  Pulse: 95   Resp: 20   Temp: 98.1 F (36.7 C)   SpO2: 98%   Weight: 229 lb (103.9 kg)   Height: '5\' 5"'$  (1.651 m)     General:  WDWN in NAD; vital signs documented above Gait: Not observed HENT: WNL, normocephalic Pulmonary: normal non-labored breathing , without wheezing Cardiac: regular HR Abdomen: soft, NT, no masses Skin: without rashes Vascular Exam/Pulses:  Right Left  Radial 2+ (normal) 2+ (normal)  Ulnar                     Extremities: without ischemic changes, without Gangrene , without cellulitis; without open wounds;  Musculoskeletal: no muscle wasting or atrophy  Neurologic: A&O X 3;  No focal weakness or paresthesias are detected Psychiatric:  The pt has Normal affect.   Non-Invasive Vascular Imaging:    IMPRESSION: Patent right innominate arterial stent. No residual thrombus is noted    ASSESSMENT/PLAN: Connie Herrera is a 47 y.o. female presenting status post right carotid cutdown, retrograde innominate stenting  using a VBX stent.  On exam,Connie Herrera was doing well, asymptomatic with no history of TIA, stroke, amaurosis the tingling she had in the right upper extremity has ceased. CT angio of the chest demonstrates widely patent innominate stent with good wall apposition.  In the axial plane, there is motion artifact, which makes it look like the stent is small, however this is not the case.  My plan is to insonate the bilateral carotid arteries with a carotid duplex study in 6 months in an effort to define velocities and waveforms to ensure there is no flow-limiting stenosis from the stent.  I do not want to continue CT imaging the chest as she is young, and does not need the radiation.  On exam today, bilateral blood pressures were symmetric.  I will check this in 6 months as well.  In regards to her anticoagulation, I will defer management to hematology.  She is pending [facility] panel, JAK2.  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 Connie Herrera has a transesophageal echo pending which will hopefully shed some light.   Regardless of anticoagulation strategy, quarantine would benefit from lifelong ASA 81 mg daily.    Broadus John, MD [facility] [phone]

## 2022-05-29 NOTE — Discharge Instructions (Addendum)
Overall your physical exam was reassuring.  Your urinalysis was without signs of urinary tract infection.  Your vaginal swab should result tomorrow and we will call if positive and start treatment.  I have attached some information on palpitations, please keep your follow-up appointments.  Please seek immediate care if you develop chest pain, shortness of breath, or worsening of condition.

## 2022-05-30 ENCOUNTER — Other Ambulatory Visit: Payer: Self-pay

## 2022-05-30 DIAGNOSIS — I749 Embolism and thrombosis of unspecified artery: Secondary | ICD-10-CM

## 2022-05-30 DIAGNOSIS — Z959 Presence of cardiac and vascular implant and graft, unspecified: Secondary | ICD-10-CM

## 2022-06-01 LAB — CERVICOVAGINAL ANCILLARY ONLY
Bacterial Vaginitis (gardnerella): NEGATIVE
Candida Glabrata: NEGATIVE
Candida Vaginitis: NEGATIVE
Chlamydia: NEGATIVE
Comment: NEGATIVE
Comment: NEGATIVE
Comment: NEGATIVE
Comment: NEGATIVE
Comment: NEGATIVE
Comment: NORMAL
Neisseria Gonorrhea: NEGATIVE
Trichomonas: NEGATIVE

## 2022-06-02 ENCOUNTER — Ambulatory Visit (INDEPENDENT_AMBULATORY_CARE_PROVIDER_SITE_OTHER): Payer: Medicaid Other | Admitting: Diagnostic Neuroimaging

## 2022-06-02 ENCOUNTER — Encounter: Payer: Self-pay | Admitting: Diagnostic Neuroimaging

## 2022-06-02 VITALS — BP 140/93 | HR 92 | Ht 67.0 in | Wt 233.0 lb

## 2022-06-02 DIAGNOSIS — I6340 Cerebral infarction due to embolism of unspecified cerebral artery: Secondary | ICD-10-CM | POA: Diagnosis not present

## 2022-06-02 NOTE — Progress Notes (Signed)
[facility]  PATIENT: Connie Herrera DOB: 03-08-1976  REFERRING CLINICIAN: Rosalin Hawking, MD HISTORY FROM: patient  REASON FOR VISIT: follow up   HISTORICAL  CHIEF COMPLAINT:  Chief Complaint  Patient presents with   Follow-up    RM 7 alone Pt is well, reports L sided arm weakness. No other concerns  She also reports spasms in upper extremities     HISTORY OF PRESENT ILLNESS:   UPDATE (06/02/22, VRP): Since last visit, patient admitted to hospital in January 2024 for left-sided numbness and weakness.  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  Also had large right brachiocephalic artery intraluminal thrombus and was started on heparin.  This was treated with right carotid cutdown and retrograde innominate artery stenting. She had extensive hypercoagulable workup which was unremarkable.  She has followed up with hematology and vascular surgery.  Since that time patient is stable.  She has transesophageal echocardiogram scheduled for June 15, 2022.  Otherwise tolerating her medications.   UPDATE (09/30/20, VRP): Since last visit, patient continues to have significant stress and anxiety.  She also had multiple emergency room visits for lower extremity pain, cramps, muscle spasms.  She had evaluation of MRI cervical and thoracic spine which were unremarkable.  Lab testing were unremarkable except recently CK was slightly elevated.  Patient having significant stress at home and becomes tearful when discussing this.  PRIOR HPI: 47 year old female here for evaluation of transient double vision, headaches, abnormal MRI.  04/14/2020 patient went to the emergency room due to several days of blurred vision and double vision. Patient noticed horizontal double vision which seem to be coming from her right eye according to the patient. If she closed 1 eye double vision would go away. Patient went to the hospital for evaluation. CT of the head was obtained which showed  hyperdensity in the left lentiform nuclei. Follow-up MRI brain confirmed a small enhancing lesion, possibly a cavernous malformation, but slightly atypical. Patient was noted to have right lateral rectus palsy on exam in the emergency room. She also had some hypertension. She was having some body aches and malaise. She had Covid testing done which was positive. She was discharged home with follow-up.  Since that time patient has noted her blood pressure has been staying high. Her Covid infection symptoms have improved. Her double vision symptoms also improved over several days.  Today patient was feeling some anxiety and stress related to this appointment. She has had some headache today.  Of note in 2013 patient had MRI of the brain which showed nonspecific T2 hyperintensities, and possibility of MS versus small vessel disease was raised. Apparently she enrolled in a clinical research study at that time and had lumbar puncture which was normal. And therefore MS was ruled out at that time.   REVIEW OF SYSTEMS: Full 14 system review of systems performed and negative with exception of: As per HPI.  ALLERGIES: No Known Allergies   HOME MEDICATIONS: Outpatient Medications Prior to Visit  Medication Sig Dispense Refill   amLODipine (NORVASC) 10 MG tablet Take 1 tablet (10 mg total) by mouth daily. 90 tablet 3   apixaban (ELIQUIS) 5 MG TABS tablet Take 1 tablet (5 mg total) by mouth 2 (two) times daily. 60 tablet 0   aspirin EC 81 MG tablet Take daily at 6 am. Swallow whole. 30 tablet 0   baclofen (LIORESAL) 10 MG tablet Take 1 tablet (10 mg total) by mouth 3 (three) times daily. 30 each 0  ferrous sulfate 325 (65 FE) MG EC tablet Take 1 tablet (325 mg total) by mouth 2 (two) times daily. (Patient taking differently: Take 1 tablet by mouth daily with breakfast.) 60 tablet 3   ibuprofen (ADVIL) 800 MG tablet Take 800 mg by mouth every 8 (eight) hours as needed.     losartan (COZAAR) 25 MG tablet  Take 1 tablet (25 mg total) by mouth daily. 90 tablet 3   naloxone (NARCAN) nasal spray 4 mg/0.1 mL Place 1 spray into the nose as needed.     ondansetron (ZOFRAN) 4 MG tablet Take 1 tablet (4 mg total) by mouth every 8 (eight) hours as needed for nausea or vomiting. 20 tablet 0   oxyCODONE-acetaminophen (PERCOCET) 10-325 MG tablet Take 1 tablet by mouth every 8 (eight) hours as needed.     pantoprazole (PROTONIX) 40 MG tablet Take 1 tablet (40 mg total) by mouth daily. 30 tablet 2   rosuvastatin (CRESTOR) 10 MG tablet Take 1 tablet (10 mg total) by mouth daily. 30 tablet 0   triamcinolone ointment (KENALOG) 0.5 % Apply 1 Application topically 2 (two) times daily. 30 g 2   Vitamin D, Ergocalciferol, (DRISDOL) 1.25 MG (50000 UNIT) CAPS capsule Take 50,000 Units by mouth once a week.     No facility-administered medications prior to visit.    PAST MEDICAL HISTORY: Past Medical History:  Diagnosis Date   Brain lesion    COVID-19 04/2020   CVA (cerebral vascular accident) ([place]) 04/2022   Diplopia    Hypertension    Iron deficiency anemia     PAST SURGICAL HISTORY: Past Surgical History:  Procedure Laterality Date   AORTOGRAM N/A 04/14/2022   Procedure: DIAGNOSTIC AORTOGRAM;  Surgeon: Broadus John, MD;  Location[facility] OR;  Service: Vascular;  Laterality: N/A;   ENDARTERECTOMY Right 04/14/2022   Procedure: CAROTID CUTDOWN WITH BRACHIOCEPHALIC STENTING, WITH ULTRASOUND OF THE RIGHT FEMORAL VEIN;  Surgeon: Broadus John, MD;  Location: [facility] OR;  Service: Vascular;  Laterality: Right;   TUBAL LIGATION      FAMILY HISTORY: Family History  Problem Relation Age of Onset   Healthy Mother    Cirrhosis Father    Cancer Maternal Grandmother 63       breast   Breast cancer Maternal Grandmother     SOCIAL HISTORY: Social History   Socioeconomic History   Marital status: Single    Spouse name: Not on file   Number of children: 4   Years of education: 14   Highest education level: Some  college, no degree  Occupational History   Occupation: Dietary Supervisor    Comment: [employer]  Tobacco Use   Smoking status: Former    Packs/day: 0.50    Years: 4.00    Total pack years: 2.00    Types: Cigarettes    Quit date: 06/04/2017    Years since quitting: 4.9    Passive exposure: Past   Smokeless tobacco: Never  Vaping Use   Vaping Use: Never used  Substance and Sexual Activity   Alcohol use: No   Drug use: Not Currently    Types: Marijuana    Comment: 09/30/20 maybe once a week   Sexual activity: Yes    Birth control/protection: Surgical  Other Topics Concern   Not on file  Social History Narrative   Patient lives at home with children.    Patient has 4 children.    Patient is single.    Patient has 2 years of  college.    Patient is right handed.    Patient is working full time. [employer].    Social Determinants of Health   Financial Resource Strain: Not on file  Food Insecurity: No Food Insecurity (04/10/2022)   Hunger Vital Sign    Worried About Running Out of Food in the Last Year: Never true    Ran Out of Food in the Last Year: Never true  Transportation Needs: No Transportation Needs (04/10/2022)   PRAPARE - Hydrologist (Medical): No    Lack of Transportation (Non-Medical): No  Physical Activity: Not on file  Stress: Not on file  Social Connections: Not on file  Intimate Partner Violence: Not At Risk (04/10/2022)   Humiliation, Afraid, Rape, and Kick questionnaire    Fear of Current or Ex-Partner: No    Emotionally Abused: No    Physically Abused: No    Sexually Abused: No     PHYSICAL EXAM  GENERAL EXAM/CONSTITUTIONAL: Vitals:  Vitals:   06/02/22 1003  BP: (!) 140/93  Pulse: 92  Weight: 233 lb (105.7 kg)  Height: '5\' 7"'$  (1.702 m)   Body mass index is 36.49 kg/m. Wt Readings from Last 3 Encounters:  06/02/22 233 lb (105.7 kg)  05/29/22 230 lb (104.3 kg)  05/29/22 229 lb (103.9 kg)   Patient is  in no distress; well developed, nourished and groomed; neck is supple  CARDIOVASCULAR: Examination of carotid arteries is normal; no carotid bruits Regular rate and rhythm, no murmurs Examination of peripheral vascular system by observation and palpation is normal  EYES: Ophthalmoscopic exam of optic discs and posterior segments is normal; no papilledema or hemorrhages No results found.  MUSCULOSKELETAL: Gait, strength, tone, movements noted in Neurologic exam below  NEUROLOGIC: MENTAL STATUS:      No data to display         awake, alert, oriented to person, place and time recent and remote memory intact normal attention and concentration language fluent, comprehension intact, naming intact fund of knowledge appropriate  CRANIAL NERVE:  2nd - no papilledema on fundoscopic exam 2nd, 3rd, 4th, 6th - pupils equal and reactive to light, visual fields full to confrontation, extraocular muscles intact, no nystagmus 5th - facial sensation symmetric 7th - facial strength symmetric 8th - hearing intact 9th - palate elevates symmetrically, uvula midline 11th - shoulder shrug symmetric 12th - tongue protrusion midline  MOTOR:  normal bulk and tone, full strength in the BUE, BLE  SENSORY:  normal and symmetric to light touch, temperature, vibration  COORDINATION:  finger-nose-finger, fine finger movements normal  REFLEXES:  deep tendon reflexes present and symmetric  GAIT/STATION:  narrow based gait    DIAGNOSTIC DATA (LABS, IMAGING, TESTING) - I reviewed patient records, labs, notes, testing and imaging myself where available.  Lab Results  Component Value Date   WBC 6.1 05/18/2022   HGB 10.6 (L) 05/18/2022   HCT 34.5 (L) 05/18/2022   MCV 78.9 (L) 05/18/2022   PLT 436 (H) 05/18/2022      Component Value Date/Time   NA 137 05/18/2022 1935   NA 140 01/09/2020 1351   K 3.6 05/18/2022 1935   CL 103 05/18/2022 1935   CO2 25 05/18/2022 1935   GLUCOSE 105 (H)  05/18/2022 1935   BUN 12 05/18/2022 1935   BUN 9 01/09/2020 1351   CREATININE 1.46 (H) 05/18/2022 1935   CALCIUM 8.7 (L) 05/18/2022 1935   PROT 7.1 04/23/2022 1526   PROT 7.0  09/30/2020 1515   ALBUMIN 3.5 04/23/2022 1526   ALBUMIN 3.8 05/05/2021 1044   AST 14 (L) 04/23/2022 1526   ALT 20 04/23/2022 1526   ALKPHOS 67 04/23/2022 1526   BILITOT 0.2 (L) 04/23/2022 1526   BILITOT <0.2 01/09/2020 1351   GFRNONAA 45 (L) 05/18/2022 1935   GFRAA 96 01/09/2020 1351   Lab Results  Component Value Date   CHOL 138 04/10/2022   HDL 36 (L) 04/10/2022   LDLCALC 68 04/10/2022   TRIG 171 (H) 04/10/2022   CHOLHDL 3.8 04/10/2022   Lab Results  Component Value Date   HGBA1C 6.0 (H) 04/10/2022   Lab Results  Component Value Date   VITAMINB12 329 04/10/2022   Lab Results  Component Value Date   TSH 2.461 04/10/2022   Lab Results  Component Value Date   CKTOTAL 50 04/22/2022   Total CK  Date Value Ref Range Status  04/22/2022 50 38 - 234 U/L Final    Comment:    Performed at [facility], [address] [place], [place] [address]  02/12/2021 112 38 - 234 U/L Final    Comment:    Performed at [facility], [address], [place], [place] [address]  10/03/2020 306 (H) 38 - 234 U/L Final    Comment:    Performed at [facility], [place] [address] [place], [place] [address]  09/30/2020 181 32 - 182 U/L Final  09/10/2020 298 (H) 38 - 234 U/L Final    Comment:    Performed at [facility], [place] [address] [place], [place] [address]   05/14/22  OnkoSight[employer] Advanced NGS JAK2, MPL and CALR Reflex to Myeloid Report  RESULT SUMMARY: NORMAL DETECTED GENOMIC ALTERATIONS: No Mutations Identified TUMOR TYPE: Not Provided CLINICAL INFORMATION: Provided ICD-10 code: D50.9 (iron deficiency anemia, unspecified). PERTINENT NEGATIVE RESULTS The following genes are NEGATIVE for clinically relevant mutations. Mutational hotspots and  surrounding exonic regions were interrogated for DNA level point mutations and indels (fusions not assayed). ABL1, ANKRD26, ASXL1, ATRX, BCOR, BCORL1, BRAF, CALR, CBL, CCND2, CDKN2A, CEBPA, CSF3R, CUX1, DDX41, DNMT3A, ETNK1, ETV6, EZH2, FBXW7, FLT3, GATA2, HRAS, IDH1, IDH2, JAK2, KDM6A, KIT, KMT2A, KRAS, MAP2K1, MPL, MYD88, NF1, NPM1, NRAS, PDGFRA, PHF6, PTEN, PTPN11, RUNX1, SETBP1, SF3B1, SRSF2, STAG2, TET2, TP53, U2AF1, WT1, ZRSR2  Component 2 wk ago  Interpretation Comment  Comment: (NOTE) Peripheral Blood: No evidence of paroxysmal nocturnal hemoglobinuria ([facility])         Component Ref Range & Units 1 mo ago 8 yr ago  Anti Nuclear Antibody (ANA) Negative Negative Negative         Component Ref Range & Units 1 mo ago  Beta-2 Glyco I IgG 0 - 20 GPI IgG units <9         Component Ref Range & Units 1 mo ago  Anticardiolipin IgG 0 - 14 GPL U/mL <9     Component 1 mo ago  Recommendations-PTGENE: Comment  Comment: (NOTE) Result: c.*97G>A - Not Detected       Component Ref Range & Units 1 mo ago  PTT Lupus Anticoagulant 0.0 - 43.5 sec 25.6  DRVVT 0.0 - 47.0 sec 35.0  Lupus Anticoag Interp  Comment: VC  Comment: (NOTE) No lupus anticoagulant was detected.     Component 1 mo ago  Recommendations-F5LEID: Comment  Comment: (NOTE) Result: c.1601G>A (p.Arg534Gln) - Not Detected   Component Ref Range & Units 1 mo ago  AntiThromb III Func 75 - 120 % 115  Component Ref Range & Units 1 mo ago (04/09/22) 1 mo ago (04/09/22) 1 yr ago (05/05/21)  Tube #  4 1   Color, CSF COLORLESS COLORLESS COLORLESS COLORLESS  Appearance, CSF CLEAR CLEAR Abnormal  CLEAR Abnormal  CLEAR  Supernatant  NOT INDICATED NOT INDICATED   RBC Count, CSF 0 /cu mm 36 High  37 High  0 R  WBC, CSF 0 - 5 /cu mm 0 0 0 R  Other Cells, CSF  TOO FEW TO COUNT, SMEAR AVAILABLE FOR REVIEW TOO FEW TO COUNT, SMEAR AVAILABLE FOR REVIEW CM       Component 1 mo ago  CSF Oligoclonal Bands Comment  Comment:  (NOTE) Zero (0) oligoclonal bands were observed in the CSF.     Component Ref Range & Units 1 mo ago (04/09/22) 1 yr ago (05/05/21) 1 yr ago (05/05/21)  Glucose, CSF 40 - 70 mg/dL 59  71 R  Total  Protein, CSF 15 - 45 mg/dL 20 23        Component Ref Range & Units 1 mo ago  VDRL Quant, CSF Non Rea:<1:1 Non Reactive      01/09/2012 MRI brain without contrast - No acute infarction.  - Numerous foci of abnormal white matter signal within the cerebral  hemispheres.  This raises concern for demyelinating  disease/multiple sclerosis.  The differential diagnosis includes an  early manifestation small vessel disease, migraine related foci and  changes related old head trauma.   11/28/13 EMG / NCS - This is a normal study. No electrodiagnostic evidence of large fiber neuropathy or myopathy at this time.  04/14/2020 MRI brain with and without [I reviewed images myself and agree with interpretation. This is a new finding compared to 2013.  -VRP]  1. 9 mm well-circumscribed lesion involving the lateral margin of the left lentiform nucleus, corresponding with finding on prior CT. 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 However, the associated enhancement about this lesion is somewhat greater and atypical for what is usually seen with these lesions. Given this, a short interval follow-up MRI, with and without contrast, in 3 months is recommended to document stability. 2. No other acute intracranial abnormality. 3. Mild T2/FLAIR hyperintensity involving the supratentorial cerebral white matter, nonspecific, but most commonly related to chronic microvascular ischemic disease.  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  No other abnormalities are noted.  Overall no significant change compared with previous MRI dated 04/14/2020  10/03/20 MRI lumbar  spine - Dr. Rory Percy called to discuss this case. To my review, including review of the thoracic study performed 08/13/2020, I do not believe that there is a cord lesion demonstrated in the distal thoracic cord region. I believe that area is normal. - Findings compatible with a small demyelinating focus in the left hemicord at the level of the T12 inferior endplate. This is likely present on the prior thoracic spine MRI but it is difficult to visualize. No abnormal enhancement after contrast administration. - Shallow disc bulge L5-S1 without stenosis. - Fibroid uterus.  04/09/22 MRI brain 1. Multiple small areas of diffusion hyperintense signal in the occipital parietal lobes bilaterally. Some of these lesions do mild enhancement. Appearance is nonspecific but could be seen with subacute infarct. Demyelinating disease a consideration but not typical. Also consider inflammatory process such as sarcoid, vasculitis, lupus and PRES. Recommend lumbar puncture for  further evaluation. 2. 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 Enhancement is somewhat atypical however is stable. No other areas of hemorrhage. 3. Mild white matter changes in the cerebral hemispheres bilaterally. This may be due to chronic microvascular ischemia. Demyelinating disease not excluded. Correlate with risk factors for small vessel disease.  04/09/22 MRI cervical spine 1. Mild degenerative change in the cervical spine. No significant stenosis. No evidence of spinal infection. 2. 2.5 cm right thyroid nodule. Left thyroid nodule 1.6 cm. (ref: J Am Coll Radiol. 2015 Feb;12(2): 143-50). Recommend thyroid ultrasound for further evaluation.  04/09/22 MRI thoracic spine Mild thoracic degenerative change. No evidence of spinal infection or spinal  stenosis in the thoracic spine.  04/09/22 CTA head / neck 1. 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 2. No acute intracranial process. The infarcts seen on the 04/09/2022 MRI are not apparent on CT. 3. No intracranial large vessel occlusion or significant stenosis. 4. No hemodynamically significant stenosis in the neck. 5. The thyroid nodules noted on the 04/09/2022 MRI of the cervical spine are not apparent on this study. Further evaluation with nonemergent ultrasound is recommended. (Reference: J Am Coll Radiol. 2015 Feb;12(2): 143-50)      ASSESSMENT AND PLAN  47 y.o. year old female here with 1 week of horizontal double vision in January 2022, with right lateral rectus palsy noted on exam in the emergency room, now resolved. Could represent microvascular ischemic infarct to abducens nerve, related to hypertension. We will proceed with further work-up with lab testing to evaluate for other etiologies. Also was found to have incidental left brain lesion, possible cavernous malformation, will follow up serial imaging.   Dx:  1. Cerebrovascular accident (CVA) due to embolism of cerebral artery (HCC)      PLAN:  MULTIPLE EMBOLIC STROKES (unexplained hypercoag state? Vasculitis?) - aspirin, eliquis, statin, BP control - follow up hematology (? 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 Virl Cagey   Hypertension Amlodipine, losartan   Hyperlipidemia LDL 68, goal < 70 rosuvastatin '10mg'$  daily   Other Stroke Risk Factors Former smoker, quit 4 years ago Substance abuse - UDS:  THC POSITIVE. Patient advised to stop using due to stroke risk. Obesity, Body mass index is 37.93 kg/m., BMI >/= 30 associated with  increased stroke risk, recommend weight loss, diet and exercise as appropriate  Obstructive sleep apnea: --> continue CPAP Migraine history   Other Active Problems Thyroid nodules bil: incidental finding on Cervical MRI, [place] completed 04/10/22.  Biopsy will need to be done in the outpatient setting; follow up with PCP  LOWER EXT MUSCLE PAIN / MYALGIA (elevated CK in past; now normalized) - unclear etiology; prior EMG/NCS and labs unremarkable  TRANSIENT DOUBLE VISION (? right CN6 palsy per ER notes on 04/14/20) - resolved  LEFT BRAIN LESION (? cavernoma; likely incidental finding) - stable in April 2022  CHRONIC SMALL VESSEL ISCHEMIC DISEASE - likely related to high BP; optimize nutrition, exercise and BP control  Return in about 4 months (around 10/01/2022).  I spent 65 minutes of face-to-face and non-face-to-face time with patient.  This included previsit chart  review, lab review, study review, order entry, electronic health record documentation, patient education.    Penni Bombard, MD [id number], [id number] AM Certified in Neurology, Neurophysiology and Neuroimaging  [facility] [address], [place], [place] [address] [phone]

## 2022-06-10 ENCOUNTER — Ambulatory Visit (INDEPENDENT_AMBULATORY_CARE_PROVIDER_SITE_OTHER): Payer: BLUE CROSS/BLUE SHIELD | Admitting: Obstetrics and Gynecology

## 2022-06-10 ENCOUNTER — Other Ambulatory Visit (HOSPITAL_COMMUNITY)
Admission: RE | Admit: 2022-06-10 | Discharge: 2022-06-10 | Disposition: A | Discharge status: Home / Self Care | Payer: BLUE CROSS/BLUE SHIELD | Source: Ambulatory Visit | Attending: Obstetrics and Gynecology | Admitting: Obstetrics and Gynecology

## 2022-06-10 ENCOUNTER — Encounter: Payer: Self-pay | Admitting: Obstetrics and Gynecology

## 2022-06-10 VITALS — BP 124/82 | HR 87 | Ht 67.0 in | Wt 232.0 lb

## 2022-06-10 DIAGNOSIS — Z01419 Encounter for gynecological examination (general) (routine) without abnormal findings: Secondary | ICD-10-CM | POA: Diagnosis not present

## 2022-06-10 DIAGNOSIS — N920 Excessive and frequent menstruation with regular cycle: Secondary | ICD-10-CM | POA: Diagnosis not present

## 2022-06-10 DIAGNOSIS — D500 Iron deficiency anemia secondary to blood loss (chronic): Secondary | ICD-10-CM | POA: Diagnosis not present

## 2022-06-10 DIAGNOSIS — D252 Subserosal leiomyoma of uterus: Secondary | ICD-10-CM

## 2022-06-10 DIAGNOSIS — D250 Submucous leiomyoma of uterus: Secondary | ICD-10-CM | POA: Diagnosis not present

## 2022-06-10 DIAGNOSIS — D251 Intramural leiomyoma of uterus: Secondary | ICD-10-CM

## 2022-06-10 NOTE — Progress Notes (Signed)
GYNECOLOGY ANNUAL PREVENTATIVE CARE ENCOUNTER NOTE  History:     Connie Herrera is a 47 y.o. [phone] female here for a routine annual gynecologic exam.  Current complaints: heavy regular menses. Menses last 4-7 days but are heavy.  This bleeding has been present approximately 1 year.  Symptoms may be worsened with the recent addition of blood thinners.   Denies  problems with intercourse or other gynecologic concerns.     Of note the patient had a stroke in January 2024, and is on anticoagulation.  She has a brachiocephalic artery stent currently.   Gynecologic History Patient's last menstrual period was 05/18/2022 (exact date). Contraception: none Last Pap: 01/09/20. Results were: normal with negative HPV Last mammogram: 02/20/21. Results were: normal, next mammogram tomorrow  Obstetric History OB History  Gravida Para Term Preterm AB Living  '4 4 4 '$ 0 0 4  SAB IAB Ectopic Multiple Live Births  0 0 0 0 4    # Outcome Date GA Lbr Len/2nd Weight Sex Delivery Anes PTL Lv  4 Term      Vag-Spont   LIV  3 Term      Vag-Spont   LIV  2 Term      Vag-Spont   LIV  1 Term      Vag-Spont   LIV    Past Medical History:  Diagnosis Date   Brain lesion    COVID-19 04/2020   CVA (cerebral vascular accident) ([place]) 04/2022   Diplopia    Hypertension    Iron deficiency anemia     Past Surgical History:  Procedure Laterality Date   AORTOGRAM N/A 04/14/2022   Procedure: DIAGNOSTIC AORTOGRAM;  Surgeon: Broadus John, MD;  Location: [facility] OR;  Service: Vascular;  Laterality: N/A;   ENDARTERECTOMY Right 04/14/2022   Procedure: CAROTID CUTDOWN WITH BRACHIOCEPHALIC STENTING, WITH ULTRASOUND OF THE RIGHT FEMORAL VEIN;  Surgeon: Broadus John, MD;  Location: [facility] OR;  Service: Vascular;  Laterality: Right;   TUBAL LIGATION      Current Outpatient Medications on File Prior to Visit  Medication Sig Dispense Refill   amLODipine (NORVASC) 10 MG tablet Take 1 tablet (10 mg total) by mouth daily.  90 tablet 3   apixaban (ELIQUIS) 5 MG TABS tablet Take 1 tablet (5 mg total) by mouth 2 (two) times daily. 60 tablet 0   aspirin EC 81 MG tablet Take daily at 6 am. Swallow whole. 30 tablet 0   baclofen (LIORESAL) 10 MG tablet Take 1 tablet (10 mg total) by mouth 3 (three) times daily. 30 each 0   ferrous sulfate 325 (65 FE) MG EC tablet Take 1 tablet (325 mg total) by mouth 2 (two) times daily. (Patient taking differently: Take 1 tablet by mouth daily with breakfast.) 60 tablet 3   losartan (COZAAR) 25 MG tablet Take 1 tablet (25 mg total) by mouth daily. 90 tablet 3   naloxone (NARCAN) nasal spray 4 mg/0.1 mL Place 1 spray into the nose as needed.     ondansetron (ZOFRAN) 4 MG tablet Take 1 tablet (4 mg total) by mouth every 8 (eight) hours as needed for nausea or vomiting. 20 tablet 0   oxyCODONE-acetaminophen (PERCOCET) 10-325 MG tablet Take 1 tablet by mouth every 8 (eight) hours as needed.     pantoprazole (PROTONIX) 40 MG tablet Take 1 tablet (40 mg total) by mouth daily. 30 tablet 2   rosuvastatin (CRESTOR) 10 MG tablet Take 1 tablet (10 mg total) by mouth  daily. 30 tablet 0   triamcinolone ointment (KENALOG) 0.5 % Apply 1 Application topically 2 (two) times daily. 30 g 2   Vitamin D, Ergocalciferol, (DRISDOL) 1.25 MG (50000 UNIT) CAPS capsule Take 50,000 Units by mouth once a week.     ibuprofen (ADVIL) 800 MG tablet Take 800 mg by mouth every 8 (eight) hours as needed. (Patient not taking: Reported on 06/10/2022)     [DISCONTINUED] hydrochlorothiazide (HYDRODIURIL) 25 MG tablet Take 1 tablet (25 mg total) by mouth daily. Must have office visit for refills 30 tablet 0   No current facility-administered medications on file prior to visit.    No Known Allergies  Social History:  reports that she quit smoking about 5 years ago. Her smoking use included cigarettes. She has a 2.00 pack-year smoking history. She has been exposed to tobacco smoke. She has never used smokeless tobacco. She  reports that she does not currently use drugs after having used the following drugs: Marijuana. She reports that she does not drink alcohol.  Family History  Problem Relation Age of Onset   Healthy Mother    Cirrhosis Father    Cancer Maternal Grandmother 34       breast   Breast cancer Maternal Grandmother     The following portions of the patient's history were reviewed and updated as appropriate: allergies, current medications, past family history, past medical history, past social history, past surgical history and problem list.  Review of Systems Pertinent items noted in HPI and remainder of comprehensive ROS otherwise negative.  Physical Exam:  BP 124/82   Pulse 87   Ht '5\' 7"'$  (1.702 m)   Wt 232 lb (105.2 kg)   LMP 05/18/2022 (Exact Date)   BMI 36.34 kg/m  CONSTITUTIONAL: Well-developed, well-nourished, obese female in no acute distress.  HENT:  Normocephalic, atraumatic, External right and left ear normal. Oropharynx is clear and moist EYES: Conjunctivae and EOM are normal.  NECK: Normal range of motion, supple, no masses.  Normal thyroid. Scar on right lateral neck from recent surgery SKIN: Skin is warm and dry. No rash noted. Not diaphoretic. No erythema. No pallor. MUSCULOSKELETAL: Normal range of motion. No tenderness.  No cyanosis, clubbing, or edema.  2+ distal pulses. NEUROLOGIC: Alert and oriented to person, place, and time. Normal reflexes, muscle tone coordination.  PSYCHIATRIC: Normal mood and affect. Normal behavior. Normal judgment and thought content. CARDIOVASCULAR: Normal heart rate noted, regular rhythm RESPIRATORY: Clear to auscultation bilaterally. Effort and breath sounds normal, no problems with respiration noted. BREASTS: deferred ABDOMEN: No distention noted. Firm mass noted just below and to the left of the umbilicus.  Mildly tender, no rebound or guarding.  PELVIC: Normal appearing external genitalia and urethral meatus; normal appearing vaginal  mucosa and cervix.  No abnormal discharge noted.  Pap smear obtained.  Grossly enlarged uterine size, no other palpable masses, no uterine or adnexal tenderness.  Performed in the presence of a chaperone.   Assessment and Plan:    1. Menorrhagia with regular cycle Discussed treatment options including myfembree, uterine fibroid embolization or hysterectomy.  Pt initially desires hysterectomy, she may not be the optimal surgical patient due to recent stroke and anticoagulation.  - US PELVIC COMPLETE WITH TRANSVAGINAL; Future  2. Anemia due to blood loss, chronic   3. Intramural, submucous, and subserous leiomyoma of uterus Will reassess uterine size with TVUS - US PELVIC COMPLETE WITH TRANSVAGINAL; Future  4. Women's annual routine gynecological examination Other than enlarged uterus, pt has normal  annual exam - Cytology - PAP( [place])  Will follow up results of pap smear and manage accordingly. Mammogram scheduled Routine preventative health maintenance measures emphasized. Please refer to After Visit Summary for other counseling recommendations.      Lynnda Shields, MD, [place] [facility] [place]

## 2022-06-10 NOTE — Progress Notes (Signed)
Pt was referred by PCP for fibroids.  Pt states she has heavy cycles lasting 4-7 days.  Pt denies any unusual cramping or pain.  Pt has mammo scheduled tomorrow.

## 2022-06-11 ENCOUNTER — Ambulatory Visit
Admission: RE | Admit: 2022-06-11 | Discharge: 2022-06-11 | Disposition: A | Discharge status: Home / Self Care | Payer: Medicaid Other | Source: Ambulatory Visit | Attending: Nurse Practitioner | Admitting: Nurse Practitioner

## 2022-06-11 DIAGNOSIS — Z1231 Encounter for screening mammogram for malignant neoplasm of breast: Secondary | ICD-10-CM

## 2022-06-14 NOTE — Anesthesia Preprocedure Evaluation (Signed)
Anesthesia Evaluation  Patient identified by MRN, date of birth, ID band Patient awake    Reviewed: Allergy & Precautions, NPO status , Patient's Chart, lab work & pertinent test results  Airway Mallampati: II  TM Distance: >3 FB Neck ROM: Full    Dental  (+) Missing, Dental Advisory Given,    Pulmonary former smoker   Pulmonary exam normal breath sounds clear to auscultation       Cardiovascular hypertension, Normal cardiovascular exam Rhythm:Regular Rate:Normal     Neuro/Psych CVA, Residual Symptoms  negative psych ROS   GI/Hepatic negative GI ROS, Neg liver ROS,,,  Endo/Other  negative endocrine ROS    Renal/GU negative Renal ROS  negative genitourinary   Musculoskeletal negative musculoskeletal ROS (+)    Abdominal   Peds  Hematology  (+) Blood dyscrasia, anemia   Anesthesia Other Findings   Reproductive/Obstetrics                             Anesthesia Physical Anesthesia Plan  ASA: 3  Anesthesia Plan: MAC   Post-op Pain Management:    Induction: Intravenous  PONV Risk Score and Plan: Propofol infusion and Treatment may vary due to age or medical condition  Airway Management Planned: Natural Airway  Additional Equipment:   Intra-op Plan:   Post-operative Plan:   Informed Consent: I have reviewed the patients History and Physical, chart, labs and discussed the procedure including the risks, benefits and alternatives for the proposed anesthesia with the patient or authorized representative who has indicated his/her understanding and acceptance.     Dental advisory given  Plan Discussed with: CRNA  Anesthesia Plan Comments:        Anesthesia Quick Evaluation

## 2022-06-15 ENCOUNTER — Other Ambulatory Visit: Payer: Self-pay

## 2022-06-15 ENCOUNTER — Encounter (HOSPITAL_COMMUNITY): Payer: Self-pay | Admitting: Internal Medicine

## 2022-06-15 ENCOUNTER — Encounter (HOSPITAL_COMMUNITY)
Admission: RE | Disposition: A | Discharge status: Home / Self Care | Payer: Self-pay | Source: Home / Self Care | Attending: Internal Medicine

## 2022-06-15 ENCOUNTER — Ambulatory Visit (HOSPITAL_COMMUNITY)
Admission: RE | Admit: 2022-06-15 | Discharge: 2022-06-15 | Disposition: A | Discharge status: Home / Self Care | Payer: BLUE CROSS/BLUE SHIELD | Attending: Internal Medicine | Admitting: Internal Medicine

## 2022-06-15 ENCOUNTER — Ambulatory Visit (HOSPITAL_BASED_OUTPATIENT_CLINIC_OR_DEPARTMENT_OTHER)
Admission: RE | Admit: 2022-06-15 | Discharge: 2022-06-15 | Disposition: A | Discharge status: Home / Self Care | Payer: BLUE CROSS/BLUE SHIELD | Source: Ambulatory Visit | Attending: Internal Medicine | Admitting: Internal Medicine

## 2022-06-15 ENCOUNTER — Ambulatory Visit (HOSPITAL_COMMUNITY): Payer: BLUE CROSS/BLUE SHIELD | Admitting: Anesthesiology

## 2022-06-15 DIAGNOSIS — D509 Iron deficiency anemia, unspecified: Secondary | ICD-10-CM | POA: Insufficient documentation

## 2022-06-15 DIAGNOSIS — Z87891 Personal history of nicotine dependence: Secondary | ICD-10-CM | POA: Diagnosis not present

## 2022-06-15 DIAGNOSIS — I639 Cerebral infarction, unspecified: Secondary | ICD-10-CM | POA: Diagnosis not present

## 2022-06-15 DIAGNOSIS — Z7901 Long term (current) use of anticoagulants: Secondary | ICD-10-CM | POA: Diagnosis not present

## 2022-06-15 DIAGNOSIS — R7303 Prediabetes: Secondary | ICD-10-CM | POA: Insufficient documentation

## 2022-06-15 DIAGNOSIS — E785 Hyperlipidemia, unspecified: Secondary | ICD-10-CM | POA: Insufficient documentation

## 2022-06-15 DIAGNOSIS — Z8673 Personal history of transient ischemic attack (TIA), and cerebral infarction without residual deficits: Secondary | ICD-10-CM | POA: Diagnosis not present

## 2022-06-15 DIAGNOSIS — Z79899 Other long term (current) drug therapy: Secondary | ICD-10-CM | POA: Diagnosis not present

## 2022-06-15 DIAGNOSIS — I10 Essential (primary) hypertension: Secondary | ICD-10-CM

## 2022-06-15 DIAGNOSIS — R002 Palpitations: Secondary | ICD-10-CM | POA: Diagnosis present

## 2022-06-15 HISTORY — PX: BUBBLE STUDY: SHX6837

## 2022-06-15 HISTORY — PX: TEE WITHOUT CARDIOVERSION: SHX5443

## 2022-06-15 LAB — ECHO TEE

## 2022-06-15 LAB — CYTOLOGY - PAP
Comment: NEGATIVE
Diagnosis: NEGATIVE
High risk HPV: NEGATIVE

## 2022-06-15 SURGERY — ECHOCARDIOGRAM, TRANSESOPHAGEAL
Anesthesia: Monitor Anesthesia Care

## 2022-06-15 MED ORDER — PROPOFOL 10 MG/ML IV BOLUS
INTRAVENOUS | Status: DC | PRN
  Administered 2022-06-15: 25 mg via INTRAVENOUS
  Administered 2022-06-15: 30 mg via INTRAVENOUS

## 2022-06-15 MED ORDER — PROPOFOL 500 MG/50ML IV EMUL
INTRAVENOUS | Status: DC | PRN
  Administered 2022-06-15: 150 ug/kg/min via INTRAVENOUS

## 2022-06-15 MED ORDER — SODIUM CHLORIDE 0.9 % IV SOLN: INTRAVENOUS | Status: DC

## 2022-06-15 NOTE — Anesthesia Procedure Notes (Signed)
Procedure Name: MAC Date/Time: 06/15/2022 8:38 AM  Performed by: Heide Scales, CRNAPre-anesthesia Checklist: Patient identified, Emergency Drugs available, Suction available and Patient being monitored Patient Re-evaluated:Patient Re-evaluated prior to induction Oxygen Delivery Method: Simple face mask Preoxygenation: Pre-oxygenation with 100% oxygen Induction Type: IV induction Dental Injury: Teeth and Oropharynx as per pre-operative assessment

## 2022-06-15 NOTE — Progress Notes (Signed)
  Echocardiogram Echocardiogram Transesophageal has been performed.  Connie Herrera 06/15/2022, 8:59 AM

## 2022-06-15 NOTE — Transfer of Care (Signed)
Immediate Anesthesia Transfer of Care Note  Patient: Connie Herrera  Procedure(s) Performed: TRANSESOPHAGEAL ECHOCARDIOGRAM (TEE) BUBBLE STUDY  Patient Location: Endoscopy Unit  Anesthesia Type:MAC  Level of Consciousness: drowsy  Airway & Oxygen Therapy: Patient Spontanous Breathing and Patient connected to nasal cannula oxygen  Post-op Assessment: Report given to RN and Post -op Vital signs reviewed and stable  Post vital signs: Reviewed and stable  Last Vitals:  Vitals Value Taken Time  BP 119/88 06/15/22 0856  Temp    Pulse 86 06/15/22 0857  Resp 29 06/15/22 0857  SpO2 95 % 06/15/22 0857  Vitals shown include unvalidated device data.  Last Pain:  Vitals:   06/15/22 0856  TempSrc:   PainSc: Asleep         Complications: No notable events documented.

## 2022-06-15 NOTE — CV Procedure (Signed)
INDICATIONS: Stroke  PROCEDURE:   Informed consent was obtained prior to the procedure. The risks, benefits and alternatives for the procedure were discussed and the patient comprehended these risks.  Risks include, but are not limited to, cough, sore throat, vomiting, nausea, somnolence, esophageal and stomach trauma or perforation, bleeding, low blood pressure, aspiration, pneumonia, infection, trauma to the teeth and death.    After a procedural time-out, the oropharynx was anesthetized with 20% benzocaine spray.   During this procedure the patient was administered a total of 325 mg propofol to achieve and maintain moderate conscious sedation.  The patient's heart rate, blood pressure, and oxygen saturationweare monitored continuously during the procedure. The period of conscious sedation was 20 minutes, of which I was present face-to-face 100% of this time.  The transesophageal probe was inserted in the esophagus and stomach without difficulty and multiple views were obtained.  The patient was kept under observation until the patient left the procedure room.  The patient left the procedure room in stable condition.   Agitated microbubble saline contrast was administered.  COMPLICATIONS:    There were no immediate complications.  FINDINGS:  Normal LV function No vegetations No significant valve disease No PFO  RECOMMENDATIONS:    No changes  Time Spent Directly with the Patient:  20 minutes   Janina Mayo 06/15/2022, 8:53 AM

## 2022-06-15 NOTE — Anesthesia Postprocedure Evaluation (Signed)
Anesthesia Post Note  Patient: Connie Herrera  Procedure(s) Performed: TRANSESOPHAGEAL ECHOCARDIOGRAM (TEE) BUBBLE STUDY     Patient location during evaluation: PACU Anesthesia Type: MAC Level of consciousness: awake and alert Pain management: pain level controlled Vital Signs Assessment: post-procedure vital signs reviewed and stable Respiratory status: spontaneous breathing, nonlabored ventilation, respiratory function stable and patient connected to nasal cannula oxygen Cardiovascular status: stable and blood pressure returned to baseline Postop Assessment: no apparent nausea or vomiting Anesthetic complications: no  No notable events documented.  Last Vitals:  Vitals:   06/15/22 0910 06/15/22 0919  BP: 118/86 127/82  Pulse: 82 87  Resp: (!) 24 (!) 22  Temp:    SpO2: 93% 95%    Last Pain:  Vitals:   06/15/22 0919  TempSrc:   PainSc: 8                  Shaymus Eveleth L Anivea Velasques

## 2022-06-15 NOTE — Discharge Instructions (Signed)

## 2022-06-15 NOTE — Interval H&P Note (Signed)
History and Physical Interval Note:  06/15/2022 7:34 AM  Connie Herrera  has presented today for surgery, with the diagnosis of stroke.  The various methods of treatment have been discussed with the patient and family. After consideration of risks, benefits and other options for treatment, the patient has consented to  Procedure(s): TRANSESOPHAGEAL ECHOCARDIOGRAM (TEE) (N/A) as a surgical intervention.  The patient's history has been reviewed, patient examined, no change in status, stable for surgery.  I have reviewed the patient's chart and labs.  Questions were answered to the patient's satisfaction.     Janina Mayo

## 2022-06-16 ENCOUNTER — Ambulatory Visit: Payer: Medicaid Other | Attending: Nurse Practitioner | Admitting: Nurse Practitioner

## 2022-06-16 ENCOUNTER — Encounter: Payer: Self-pay | Admitting: Nurse Practitioner

## 2022-06-16 ENCOUNTER — Other Ambulatory Visit: Payer: Self-pay | Admitting: Family

## 2022-06-16 VITALS — BP 124/85 | HR 82 | Ht 67.0 in | Wt 234.0 lb

## 2022-06-16 DIAGNOSIS — Z8673 Personal history of transient ischemic attack (TIA), and cerebral infarction without residual deficits: Secondary | ICD-10-CM | POA: Diagnosis not present

## 2022-06-16 DIAGNOSIS — E041 Nontoxic single thyroid nodule: Secondary | ICD-10-CM

## 2022-06-16 DIAGNOSIS — I639 Cerebral infarction, unspecified: Secondary | ICD-10-CM

## 2022-06-16 DIAGNOSIS — Z1211 Encounter for screening for malignant neoplasm of colon: Secondary | ICD-10-CM

## 2022-06-16 DIAGNOSIS — L309 Dermatitis, unspecified: Secondary | ICD-10-CM | POA: Diagnosis not present

## 2022-06-16 DIAGNOSIS — Z7689 Persons encountering health services in other specified circumstances: Secondary | ICD-10-CM

## 2022-06-16 DIAGNOSIS — L918 Other hypertrophic disorders of the skin: Secondary | ICD-10-CM | POA: Diagnosis not present

## 2022-06-16 MED ORDER — ROSUVASTATIN CALCIUM 10 MG PO TABS: 10.0000 mg | ORAL_TABLET | Freq: Every day | ORAL | 3 refills | Status: DC

## 2022-06-16 MED ORDER — TRIAMCINOLONE ACETONIDE 0.1 % EX OINT: 1.0000 | TOPICAL_OINTMENT | Freq: Two times a day (BID) | CUTANEOUS | 0 refills | Status: DC

## 2022-06-16 MED ORDER — ASPIRIN 81 MG PO TBEC: 81.0000 mg | DELAYED_RELEASE_TABLET | Freq: Every day | ORAL | 3 refills | Status: DC

## 2022-06-16 NOTE — Progress Notes (Signed)
Assessment & Plan:  Connie Herrera was seen today for establish care.  Diagnoses and all orders for this visit:  Encounter to establish care  History of CVA (cerebrovascular accident) -     aspirin EC 81 MG tablet; Take 1 tablet (81 mg total) by mouth daily. Take daily at 6 am. Swallow whole. -     rosuvastatin (CRESTOR) 10 MG tablet; Take 1 tablet (10 mg total) by mouth daily. -     CMP14+EGFR  Skin tag -     Ambulatory referral to Dermatology  Eczema, unspecified type -     triamcinolone ointment (KENALOG) 0.1 %; Apply 1 Application topically 2 (two) times daily.  Colon cancer screening -     Ambulatory referral to Gastroenterology  Thyroid nodule Needs biopsy. Concerning nodule in the left thyroid lobe -     Ambulatory referral to Endocrinology    Patient has been counseled on age-appropriate routine health concerns for screening and prevention. These are reviewed and up-to-date. Referrals have been placed accordingly. Immunizations are up-to-date or declined.    Subjective:   Chief Complaint  Patient presents with   Establish Care   HPI Connie Herrera 47 y.o. female presents to office today to establish care  She has a past medical history of Anemia (IV iron), AUB, Fibroids, Brain lesion, COVID-19 (04/2020), CVA (04/2022), Arterial thrombosis, GERD, Diplopia, Hypertension, and Iron deficiency anemia.   Endorses pain and weakness in her bilateral arms. Pain is worse when she tries to lift her arms above her head. She states this all happened when they cut through my neck (s/p R brachiocephalic artery clot requiring stent placement). Negative hypercoag/autoimmune panel.   Wants to know if her potassium and other electrolytes/vitamins are low due to intermittent muscle cramps she experiences in her legs.   AUB Followed by GYN. Has Fibroids. Needs surgery (desires hysterectomy) not ideal candidate due to stroke history and anticoagulation.   Requests Derm referral.  Has skin tag on left side of neck she would like removed. Has eczema. States kenalog 0.5% too strong ([place]). Would like to use 1% that her friend had and she used on her face.    BP Readings from Last 3 Encounters:  06/16/22 124/85  06/15/22 127/82  06/10/22 124/82      Review of Systems  Constitutional:  Negative for fever, malaise/fatigue and weight loss.  HENT: Negative.  Negative for nosebleeds.   Eyes: Negative.  Negative for blurred vision, double vision and photophobia.  Respiratory: Negative.  Negative for cough and shortness of breath.   Cardiovascular: Negative.  Negative for chest pain, palpitations and leg swelling.  Gastrointestinal: Negative.  Negative for heartburn, nausea and vomiting.  Musculoskeletal:  Positive for myalgias.  Skin:  Positive for itching and rash.  Neurological: Negative.  Negative for dizziness, focal weakness, seizures and headaches.  Psychiatric/Behavioral: Negative.  Negative for suicidal ideas.     Past Medical History:  Diagnosis Date   Brain lesion    COVID-19 04/2020   CVA (cerebral vascular accident) ([place]) 04/2022   Diplopia    Hypertension    Iron deficiency anemia     Past Surgical History:  Procedure Laterality Date   AORTOGRAM N/A 04/14/2022   Procedure: DIAGNOSTIC AORTOGRAM;  Surgeon: Broadus John, MD;  Location: [facility] OR;  Service: Vascular;  Laterality: N/A;   ENDARTERECTOMY Right 04/14/2022   Procedure: CAROTID CUTDOWN WITH BRACHIOCEPHALIC STENTING, WITH ULTRASOUND OF THE RIGHT FEMORAL VEIN;  Surgeon: Broadus John, MD;  Location:  [facility] OR;  Service: Vascular;  Laterality: Right;   TUBAL LIGATION      Family History  Problem Relation Age of Onset   Healthy Mother    Cirrhosis Father    Cancer Maternal Grandmother 32       breast   Breast cancer Maternal Grandmother     Social History Reviewed with no changes to be made today.   Outpatient Medications Prior to Visit  Medication Sig Dispense Refill   amLODipine  (NORVASC) 10 MG tablet Take 1 tablet (10 mg total) by mouth daily. 90 tablet 3   apixaban (ELIQUIS) 5 MG TABS tablet Take 1 tablet (5 mg total) by mouth 2 (two) times daily. 60 tablet 0   cyclobenzaprine (FLEXERIL) 10 MG tablet Take 10 mg by mouth at bedtime.     ferrous sulfate 325 (65 FE) MG EC tablet Take 1 tablet (325 mg total) by mouth 2 (two) times daily. (Patient taking differently: Take 1 tablet by mouth daily with breakfast.) 60 tablet 3   ibuprofen (ADVIL) 800 MG tablet Take 800 mg by mouth every 8 (eight) hours as needed.     losartan (COZAAR) 25 MG tablet Take 1 tablet (25 mg total) by mouth daily. 90 tablet 3   naloxone (NARCAN) nasal spray 4 mg/0.1 mL Place 1 spray into the nose as needed.     ondansetron (ZOFRAN) 4 MG tablet Take 1 tablet (4 mg total) by mouth every 8 (eight) hours as needed for nausea or vomiting. 20 tablet 0   oxyCODONE-acetaminophen (PERCOCET) 10-325 MG tablet Take 1 tablet by mouth every 8 (eight) hours as needed for pain.     pantoprazole (PROTONIX) 40 MG tablet Take 1 tablet (40 mg total) by mouth daily. 30 tablet 2   Vitamin D, Ergocalciferol, (DRISDOL) 1.25 MG (50000 UNIT) CAPS capsule Take 50,000 Units by mouth every Thursday.     aspirin EC 81 MG tablet Take daily at 6 am. Swallow whole. (Patient taking differently: Take 81 mg by mouth daily.) 30 tablet 0   baclofen (LIORESAL) 10 MG tablet Take 1 tablet (10 mg total) by mouth 3 (three) times daily. (Patient taking differently: Take 10 mg by mouth 3 (three) times daily as needed for muscle spasms.) 30 each 0   rosuvastatin (CRESTOR) 10 MG tablet Take 1 tablet (10 mg total) by mouth daily. 30 tablet 0   triamcinolone ointment (KENALOG) 0.5 % Apply 1 Application topically 2 (two) times daily. (Patient not taking: Reported on 06/16/2022) 30 g 2   No facility-administered medications prior to visit.    No Known Allergies     Objective:    BP 124/85   Pulse 82   Ht '5\' 7"'$  (1.702 m)   Wt 234 lb (106.1 kg)    LMP 05/18/2022 (Exact Date)   SpO2 100%   BMI 36.65 kg/m  Wt Readings from Last 3 Encounters:  06/16/22 234 lb (106.1 kg)  06/15/22 232 lb (105.2 kg)  06/10/22 232 lb (105.2 kg)    Physical Exam Vitals and nursing note reviewed.  Constitutional:      Appearance: She is well-developed.  HENT:     Head: Normocephalic and atraumatic.  Cardiovascular:     Rate and Rhythm: Normal rate and regular rhythm.     Heart sounds: Normal heart sounds. No murmur heard.    No friction rub. No gallop.  Pulmonary:     Effort: Pulmonary effort is normal. No tachypnea or respiratory distress.     Breath  sounds: Normal breath sounds. No decreased breath sounds, wheezing, rhonchi or rales.  Chest:     Chest wall: No tenderness.  Abdominal:     General: Bowel sounds are normal.     Palpations: Abdomen is soft.  Musculoskeletal:        General: Normal range of motion.     Cervical back: Normal range of motion.  Skin:    General: Skin is warm and dry.  Neurological:     Mental Status: She is alert and oriented to person, place, and time.     Coordination: Coordination normal.  Psychiatric:        Behavior: Behavior normal. Behavior is cooperative.        Thought Content: Thought content normal.        Judgment: Judgment normal.          Patient has been counseled extensively about nutrition and exercise as well as the importance of adherence with medications and regular follow-up. The patient was given clear instructions to go to ER or return to medical center if symptoms don't improve, worsen or new problems develop. The patient verbalized understanding.   Follow-up: Return in about 3 months (around 09/16/2022).   [name], FNP-BC [facility] [place], [place]   06/16/2022, 4:35 PM

## 2022-06-17 ENCOUNTER — Other Ambulatory Visit: Payer: Self-pay

## 2022-06-17 ENCOUNTER — Encounter (HOSPITAL_COMMUNITY): Payer: Self-pay | Admitting: Internal Medicine

## 2022-06-17 ENCOUNTER — Inpatient Hospital Stay: Payer: BLUE CROSS/BLUE SHIELD | Attending: Hematology and Oncology | Admitting: Hematology and Oncology

## 2022-06-17 VITALS — BP 126/81 | HR 99 | Temp 97.7°F | Resp 16 | Ht 67.0 in | Wt 235.7 lb

## 2022-06-17 DIAGNOSIS — I10 Essential (primary) hypertension: Secondary | ICD-10-CM | POA: Diagnosis not present

## 2022-06-17 DIAGNOSIS — D509 Iron deficiency anemia, unspecified: Secondary | ICD-10-CM | POA: Diagnosis not present

## 2022-06-17 DIAGNOSIS — Z7901 Long term (current) use of anticoagulants: Secondary | ICD-10-CM | POA: Insufficient documentation

## 2022-06-17 DIAGNOSIS — Z87891 Personal history of nicotine dependence: Secondary | ICD-10-CM | POA: Diagnosis not present

## 2022-06-17 DIAGNOSIS — N920 Excessive and frequent menstruation with regular cycle: Secondary | ICD-10-CM | POA: Insufficient documentation

## 2022-06-17 DIAGNOSIS — I749 Embolism and thrombosis of unspecified artery: Secondary | ICD-10-CM | POA: Diagnosis not present

## 2022-06-17 LAB — CMP14+EGFR
ALT: 28 IU/L (ref 0–32)
AST: 19 IU/L (ref 0–40)
Albumin/Globulin Ratio: 1.4 (ref 1.2–2.2)
Albumin: 3.9 g/dL (ref 3.9–4.9)
Alkaline Phosphatase: 82 IU/L (ref 44–121)
BUN/Creatinine Ratio: 11 (ref 9–23)
BUN: 8 mg/dL (ref 6–24)
Bilirubin Total: <0.2 mg/dL (ref 0.0–1.2)
CO2: 23 mmol/L (ref 20–29)
Calcium: 8.9 mg/dL (ref 8.7–10.2)
Chloride: 101 mmol/L (ref 96–106)
Creatinine, Ser: 0.72 mg/dL (ref 0.57–1.00)
Globulin, Total: 2.7 g/dL (ref 1.5–4.5)
Glucose: 93 mg/dL (ref 70–99)
Potassium: 4.2 mmol/L (ref 3.5–5.2)
Sodium: 138 mmol/L (ref 134–144)
Total Protein: 6.6 g/dL (ref 6.0–8.5)
eGFR: 104 mL/min/{1.73_m2} (ref >59)

## 2022-06-17 NOTE — Progress Notes (Signed)
[place] NOTE  Patient Care Team: [name], NP as PCP - General (Nurse Practitioner) Janina Mayo, MD as PCP - Cardiology (Cardiology)  CHIEF COMPLAINTS/PURPOSE OF CONSULTATION:  IDA, arterial thrombosis  ASSESSMENT & PLAN:   This is a pleasant 47 year old African-American female patient with past medical history significant for hypertension referred to hematology for recommendations regarding iron deficiency anemia and hypercoagulable work up JAK2 testing neg, PNH panel normal, rest of hypercoag work up done in the hospital was previously reviewed and neg. She is going to see obgyn for menorrhagia, they might consider hysterectomy. From arterial thrombosis stand point, since hyper coag work is neg, we discussed that there is no clear role of anticoagulation. She should however consider continuing aspirin daily. I have also recommended that she continue oral iron supplementation at this time. RTC in 6 months or sooner.  HISTORY OF PRESENTING ILLNESS:  Connie Herrera 47 y.o. 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  She had hypercoagulable workup which was unremarkable and she is currently on Eliquis.  She has been back-and-forth to the ER with several other complaints and now follows up with vascular surgery, cardiology for further recommendations.  With regards to her iron deficiency, she received some IV iron in the hospital and she is currently continuing on oral iron replacement.    Interval history  She is here for follow up. She complains of fatigue ongoing, still has left arm weakness.  She is taking oral iron once a day. She still has very heavy menstrual  cycles.  Rest of the pertinent 10 ROS reviewed and neg.  REVIEW OF SYSTEMS:   Constitutional: Denies fevers, chills or abnormal night sweats Eyes: Denies blurriness of vision, double vision or watery eyes Ears, nose, mouth, throat, and face: Denies mucositis or sore throat Respiratory: Denies cough, dyspnea or wheezes Cardiovascular: Denies palpitation, chest discomfort or lower extremity swelling Gastrointestinal:  Denies nausea, heartburn or change in bowel habits Skin: Denies abnormal skin rashes Lymphatics: Denies new lymphadenopathy or easy bruising Neurological:Denies numbness, tingling or new weaknesses Behavioral/Psych: Mood is stable, no new changes  All other systems were reviewed with the patient and are negative.  MEDICAL HISTORY:  Past Medical History:  Diagnosis Date   Brain lesion    COVID-19 04/2020   CVA (cerebral vascular accident) ([place]) 04/2022   Diplopia    Hypertension    Iron deficiency anemia     SURGICAL HISTORY: Past Surgical History:  Procedure Laterality Date   AORTOGRAM N/A 04/14/2022   Procedure: DIAGNOSTIC AORTOGRAM;  Surgeon: Broadus John, MD;  Location: [facility] OR;  Service: Vascular;  Laterality: N/A;   ENDARTERECTOMY Right 04/14/2022   Procedure: CAROTID CUTDOWN WITH BRACHIOCEPHALIC STENTING, WITH ULTRASOUND OF THE RIGHT FEMORAL VEIN;  Surgeon: Broadus John, MD;  Location: [facility] OR;  Service: Vascular;  Laterality: Right;   TUBAL LIGATION      SOCIAL HISTORY: Social History   Socioeconomic History   Marital status: Single    Spouse name: Not on file   Number of children: 4   Years of education: 14   Highest education level: Some college, no degree  Occupational History   Occupation: Dietary Supervisor    Comment: [employer]  Tobacco Use   Smoking status: Former    Packs/day: 0.50    Years: 4.00    Total pack years: 2.00    Types: Cigarettes    Quit date: 06/04/2017    Years since quitting: 5.0    Passive exposure: Past    Smokeless tobacco: Never  Vaping Use   Vaping Use: Never used  Substance and Sexual Activity   Alcohol use: No   Drug use: Not Currently    Types: Marijuana    Comment: 09/30/20 maybe once a week   Sexual activity: Yes    Birth control/protection: Surgical  Other Topics Concern   Not on file  Social History Narrative   Patient lives at home with children.    Patient has 4 children.    Patient is single.    Patient has 2 years of college.    Patient is right handed.    Patient is working full time. [employer].    Social Determinants of Health   Financial Resource Strain: Not on file  Food Insecurity: No Food Insecurity (04/10/2022)   Hunger Vital Sign    Worried About Running Out of Food in the Last Year: Never true    Ran Out of Food in the Last Year: Never true  Transportation Needs: No Transportation Needs (04/10/2022)   PRAPARE - Hydrologist (Medical): No    Lack of Transportation (Non-Medical): No  Physical Activity: Not on file  Stress: Not on file  Social Connections: Not on file  Intimate Partner Violence: Not At Risk (04/10/2022)   Humiliation, Afraid, Rape, and Kick questionnaire    Fear of Current or Ex-Partner: No    Emotionally Abused: No    Physically Abused: No    Sexually Abused: No    FAMILY HISTORY: Family History  Problem Relation Age of Onset   Healthy Mother    Cirrhosis Father    Cancer Maternal Grandmother 32       breast   Breast cancer Maternal Grandmother     ALLERGIES:  has No Known Allergies.  MEDICATIONS:  Current Outpatient Medications  Medication Sig Dispense Refill   amLODipine (NORVASC) 10 MG tablet Take 1 tablet (10 mg total) by mouth daily. 90 tablet 3   apixaban (ELIQUIS) 5 MG TABS tablet Take 1 tablet (5 mg total) by mouth 2 (two) times daily. 60 tablet 0   aspirin EC 81 MG tablet Take 1 tablet (81 mg total) by mouth daily. Take daily at 6 am. Swallow whole. 90 tablet 3   cyclobenzaprine  (FLEXERIL) 10 MG tablet Take 10 mg by mouth at bedtime.     ferrous sulfate 325 (65 FE) MG EC tablet Take 1 tablet (325 mg total) by mouth 2 (two) times daily. (Patient taking differently: Take 1 tablet by mouth daily with breakfast.) 60 tablet 3   ibuprofen (ADVIL) 800 MG tablet Take 800 mg by mouth every 8 (eight) hours as needed.     losartan (COZAAR) 25 MG tablet Take 1 tablet (25 mg total) by mouth daily. 90 tablet 3   naloxone (NARCAN) nasal spray 4 mg/0.1 mL Place 1 spray into the nose as needed.     ondansetron (ZOFRAN) 4 MG tablet Take 1 tablet (4 mg total) by mouth every 8 (eight) hours as needed for nausea or vomiting. 20 tablet 0   oxyCODONE-acetaminophen (PERCOCET) 10-325 MG tablet Take 1 tablet by mouth every 8 (eight) hours as needed for pain.  pantoprazole (PROTONIX) 40 MG tablet Take 1 tablet (40 mg total) by mouth daily. 30 tablet 2   rosuvastatin (CRESTOR) 10 MG tablet Take 1 tablet (10 mg total) by mouth daily. 90 tablet 3   triamcinolone ointment (KENALOG) 0.1 % Apply 1 Application topically 2 (two) times daily. 30 g 0   Vitamin D, Ergocalciferol, (DRISDOL) 1.25 MG (50000 UNIT) CAPS capsule Take 50,000 Units by mouth every Thursday.     No current facility-administered medications for this visit.     PHYSICAL EXAMINATION: ECOG PERFORMANCE STATUS: 0 - Asymptomatic  Vitals:   06/17/22 1402  BP: 126/81  Pulse: 99  Resp: 16  Temp: 97.7 F (36.5 C)  SpO2: (!) 16%     Filed Weights   06/17/22 1402  Weight: 235 lb 11.2 oz (106.9 kg)   LABORATORY DATA:  I have reviewed the data as listed Lab Results  Component Value Date   WBC 6.1 05/18/2022   HGB 10.6 (L) 05/18/2022   HCT 34.5 (L) 05/18/2022   MCV 78.9 (L) 05/18/2022   PLT 436 (H) 05/18/2022     Chemistry      Component Value Date/Time   NA 138 06/16/2022 1156   K 4.2 06/16/2022 1156   CL 101 06/16/2022 1156   CO2 23 06/16/2022 1156   BUN 8 06/16/2022 1156   CREATININE 0.72 06/16/2022 1156       Component Value Date/Time   CALCIUM 8.9 06/16/2022 1156   ALKPHOS 82 06/16/2022 1156   AST 19 06/16/2022 1156   ALT 28 06/16/2022 1156   BILITOT <0.2 06/16/2022 1156     Most recent labs with iron saturation of 4%, total iron binding capacity of 4 and serum iron of 22.   RADIOGRAPHIC STUDIES: I have personally reviewed the radiological images as listed and agreed with the findings in the report. MM 3D SCREEN BREAST BILATERAL  Result Date: 06/15/2022 CLINICAL DATA:  Screening. EXAM: DIGITAL SCREENING BILATERAL MAMMOGRAM WITH TOMOSYNTHESIS AND CAD TECHNIQUE: Bilateral screening digital craniocaudal and mediolateral oblique mammograms were obtained. Bilateral screening digital breast tomosynthesis was performed. The images were evaluated with computer-aided detection. COMPARISON:  Previous exam(s). ACR Breast Density Category c: The breasts are heterogeneously dense, which may obscure small masses. FINDINGS: There are no findings suspicious for malignancy. IMPRESSION: No mammographic evidence of malignancy. A result letter of this screening mammogram will be mailed directly to the patient. RECOMMENDATION: Screening mammogram in one year. (Code:SM-B-01Y) BI-RADS CATEGORY  1: Negative. Electronically Signed   By: Kristopher Oppenheim M.D.   On: 06/15/2022 14:30   ECHO TEE  Result Date: 06/15/2022    TRANSESOPHOGEAL ECHO REPORT   Patient Name:   Connie Herrera Date of Exam: 06/15/2022 Medical Rec #:  [id number]            Height:       67.0 in Accession #:    [id number]           Weight:       232.0 lb Date of Birth:  1975/09/20           BSA:          2.154 m Patient Age:    55 years             BP:           122/84 mmHg Patient Gender: F                    HR:  86 bpm. Exam Location:  Inpatient Procedure: Transesophageal Echo, Color Doppler and Saline Contrast Bubble Study Indications:     Stroke  History:         Patient has no prior history of Echocardiogram examinations.                   Stroke.  Sonographer:     Roseanna Rainbow RDCS Referring Phys:  [place] Diagnosing Phys: Mary [place] PROCEDURE: After discussion of the risks and benefits of a TEE, an informed consent was obtained from the patient. The transesophogeal probe was passed without difficulty through the esophogus of the patient. Imaged were obtained with the patient in a left lateral decubitus position. Sedation performed by different physician. The patient was monitored while under deep sedation. Anesthestetic sedation was provided intravenously by Anesthesiology: '323mg'$  of Propofol. The patient's vital signs; including heart rate, blood pressure, and oxygen saturation; remained stable throughout the procedure. The patient developed no complications during the procedure. Transgastric views not obtained.  IMPRESSIONS  1. Left ventricular ejection fraction, by estimation, is 60 to 65%. The left ventricle has normal function.  2. No left atrial/left atrial appendage thrombus was detected.  3. The mitral valve is normal in structure. No evidence of mitral valve regurgitation.  4. The aortic valve is normal in structure. Aortic valve regurgitation is not visualized.  5. Agitated saline contrast bubble study was negative, with no evidence of any interatrial shunt. Conclusion(s)/Recommendation(s): No LA/LAA thrombus identified. Negative bubble study for interatrial shunt. No intracardiac source of embolism detected on this on this transesophageal echocardiogram. FINDINGS  Left Ventricle: Left ventricular ejection fraction, by estimation, is 60 to 65%. The left ventricle has normal function. The left ventricular internal cavity size was normal in size. Left Atrium: No left atrial/left atrial appendage thrombus was detected. Mitral Valve: The mitral valve is normal in structure. No evidence of mitral valve regurgitation. Tricuspid Valve: The tricuspid valve is normal in structure. Tricuspid valve regurgitation is not demonstrated. Aortic  Valve: The aortic valve is normal in structure. Aortic valve regurgitation is not visualized. Pulmonic Valve: The pulmonic valve was normal in structure. Pulmonic valve regurgitation is not visualized. Aorta: There is minimal (Grade I) plaque. IAS/Shunts: Agitated saline contrast was given intravenously to evaluate for intracardiac shunting. Agitated saline contrast bubble study was negative, with no evidence of any interatrial shunt. Phineas Inches Electronically signed by Phineas Inches Signature Date/Time: 06/15/2022/10:12:59 AM    Final    CT ANGIO CHEST AORTA W/CM & OR WO/CM  Result Date: 05/26/2022 CLINICAL DATA:  History of right brachiocephalic arterial thrombus EXAM: CT ANGIOGRAPHY CHEST WITH CONTRAST TECHNIQUE: Multidetector CT imaging of the chest was performed using the standard protocol during bolus administration of intravenous contrast. Multiplanar CT image reconstructions and MIPs were obtained to evaluate the vascular anatomy. RADIATION DOSE REDUCTION: This exam was performed according to the departmental dose-optimization program which includes automated exposure control, adjustment of the mA and/or kV according to patient size and/or use of iterative reconstruction technique. CONTRAST:  46m OMNIPAQUE IOHEXOL 300 MG/ML  SOLN COMPARISON:  04/09/2022 FINDINGS: Cardiovascular: Initial precontrast images show no hyperdense crescent to suggest acute aortic injury. Right innominate stent is noted in place. Post-contrast images demonstrate the innominate arterial stent to be patent. No residual thrombus is identified. The thoracic aorta is within normal limits. No aneurysmal dilatation or dissection is seen. The heart is mildly enlarged in size. Pulmonary artery is within normal limits although not timed for pulmonary embolus  evaluation. Mediastinum/Nodes: Thoracic inlet shows an 18 mm left thyroid nodule stable in appearance from prior exam. No hilar or mediastinal adenopathy is noted. The esophagus as  visualized is within normal limits. Lungs/Pleura: Lungs are clear. No pleural effusion or pneumothorax. Upper Abdomen: No acute abnormality. Musculoskeletal: No acute rib abnormality is noted. No acute bony abnormality is seen. Review of the MIP images confirms the above findings. IMPRESSION: Patent right innominate arterial stent. No residual thrombus is noted 18 mm left thyroid nodule. This has been evaluated on previous imaging. (ref: J Am Coll Radiol. 2015 Feb;12(2): 143-50). No other focal abnormality is noted. Electronically Signed   By: Inez Catalina M.D.   On: 05/26/2022 18:43   DG Chest 2 View  Result Date: 05/18/2022 CLINICAL DATA:  Shortness of breath EXAM: CHEST - 2 VIEW COMPARISON:  Chest radiograph 05/12/2022 FINDINGS: The cardiomediastinal silhouette is stable and within normal limits. A brachiocephalic stent is again noted. The lungs are clear, with no focal consolidation or pulmonary edema. There is no pleural effusion or pneumothorax There is no acute osseous abnormality. IMPRESSION: No radiographic evidence of acute cardiopulmonary process. Electronically Signed   By: Valetta Mole M.D.   On: 05/18/2022 19:57    All questions were answered. The patient knows to call the clinic with any problems, questions or concerns. I spent 20 minutes in the care of this patient including H and P, review of records, counseling and coordination of care.     Benay Pike, MD 06/17/2022 2:33 PM

## 2022-06-19 ENCOUNTER — Ambulatory Visit (HOSPITAL_COMMUNITY): Payer: Medicaid Other

## 2022-06-26 ENCOUNTER — Other Ambulatory Visit: Payer: Self-pay | Admitting: Nurse Practitioner

## 2022-06-26 NOTE — Telephone Encounter (Signed)
Medication Refill - Medication: ferrous sulfate 325 (65 FE) MG EC tablet   Has the patient contacted their pharmacy? No. (Agent: If no, request that the patient contact the pharmacy for the refill. If patient does not wish to contact the pharmacy document the reason why and proceed with request.) (Agent: If yes, when and what did the pharmacy advise?)  Preferred Pharmacy (with phone number or street name):  [id number] - [place], [place] Phone: [phone]  Fax: [phone]     Has the patient been seen for an appointment in the last year OR does the patient have an upcoming appointment? Yes.    Agent: Please be advised that RX refills may take up to 3 business days. We ask that you follow-up with your pharmacy.

## 2022-06-29 NOTE — Telephone Encounter (Signed)
Requested medications are due for refill today.  yes  Requested medications are on the active medications list.  yes  Last refill. 10/5/20023 #60 3 rf  Future visit scheduled.   yes  Notes to clinic.  Arletha Pili Iruku signed Rx.    Requested Prescriptions  Pending Prescriptions Disp Refills   ferrous sulfate 325 (65 FE) MG EC tablet 60 tablet 3    Sig: Take 1 tablet (325 mg total) by mouth 2 (two) times daily.     Endocrinology:  Minerals - Iron Supplementation Failed - 06/26/2022  1:46 PM      Failed - HGB in normal range and within 360 days    Hemoglobin  Date Value Ref Range Status  05/18/2022 10.6 (L) 12.0 - 15.0 g/dL Final  01/09/2020 10.6 (L) 11.1 - 15.9 g/dL Final         Failed - HCT in normal range and within 360 days    HCT  Date Value Ref Range Status  05/18/2022 34.5 (L) 36.0 - 46.0 % Final   Hematocrit  Date Value Ref Range Status  01/09/2020 32.8 (L) 34.0 - 46.6 % Final         Failed - Fe (serum) in normal range and within 360 days    Iron  Date Value Ref Range Status  05/14/2022 27 (L) 28 - 170 ug/dL Final   Saturation Ratios  Date Value Ref Range Status  05/14/2022 6 (L) 10.4 - 31.8 % Final         Passed - RBC in normal range and within 360 days    RBC  Date Value Ref Range Status  05/18/2022 4.37 3.87 - 5.11 MIL/uL Final         Passed - Ferritin in normal range and within 360 days    Ferritin  Date Value Ref Range Status  05/14/2022 18 11 - 307 ng/mL Final    Comment:    Performed at [facility], [address] [place], [place] [address]         Passed - Valid encounter within last 12 months    Recent Outpatient Visits           1 week ago Encounter to establish care   [place], Vernia Buff, NP   1 month ago Primary hypertension   [facility], [place], NP   2 months ago Hospital discharge follow-up   [facility], [place], NP   2 months ago Encounter to establish care   [facility] at [facility], Amy J, NP   3 years ago Muscle spasm   [place], Enobong, MD       Future Appointments             In 1 month Branch, Royetta Crochet, MD [place] [facility]   In 2 months [name], NP [place]

## 2022-07-13 ENCOUNTER — Other Ambulatory Visit: Payer: Self-pay | Admitting: Nurse Practitioner

## 2022-07-13 ENCOUNTER — Other Ambulatory Visit: Payer: Self-pay | Admitting: Family

## 2022-07-13 DIAGNOSIS — I639 Cerebral infarction, unspecified: Secondary | ICD-10-CM

## 2022-07-17 ENCOUNTER — Telehealth: Payer: Self-pay

## 2022-07-17 NOTE — Telephone Encounter (Signed)
Patient aware of gi referral colonoscopy.

## 2022-07-17 NOTE — Telephone Encounter (Signed)
Copied from CRM [id number]. Topic: Referral - Status >> Jul 17, 2022  9:06 AM Macon Large wrote: Reason for CRM: Pt questions why she was referred to [facility]. Pt requests call back to advise.

## 2022-07-19 ENCOUNTER — Other Ambulatory Visit: Payer: Self-pay | Admitting: Obstetrics

## 2022-07-19 DIAGNOSIS — Z01419 Encounter for gynecological examination (general) (routine) without abnormal findings: Secondary | ICD-10-CM

## 2022-07-28 ENCOUNTER — Telehealth: Payer: Self-pay | Admitting: Diagnostic Neuroimaging

## 2022-07-28 NOTE — Telephone Encounter (Signed)
Sent mychart msg informing pt of appointment change due to provider template change 

## 2022-08-04 ENCOUNTER — Ambulatory Visit: Payer: Self-pay | Admitting: *Deleted

## 2022-08-04 NOTE — Telephone Encounter (Signed)
Message from Randol Kern sent at 08/04/2022  9:54 AM EDT  Summary: Muscle spasms   Pt called reporting that she has been having shoulder and neck spasms, no appt soon enough  Best contact: [phone]         Chief Complaint: bilateral arm and neck spasms Symptoms: frequent spasms that are moderately painful- pt having difficult time sleeping because of the pain Frequency: January Pertinent Negatives: Patient denies worsening weakness Disposition: [] ED /[] Urgent Care (no appt availability in office) / [] Appointment(In office/virtual)/ []  [facility]/ [] Home Care/ [] Refused Recommended Disposition /[x] [facility]/ []  Follow-up with PCP Additional Notes: 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 No openings at office.   Reason for Disposition  Muscle aches are a chronic symptom (recurrent or ongoing AND present > 4 weeks)  Answer Assessment - Initial Assessment Questions 1. ONSET: "When did the muscle aches or body pains start?"      Since jan 2. LOCATION: "What part of your body is hurting?" (e.g., entire body, arms, legs)      Neck and both arms 3. SEVERITY: "How bad is the pain?" (Scale 1-10; or mild, moderate, severe)   - MILD (1-3): doesn't interfere with normal activities    - MODERATE (4-7): interferes with normal activities or awakens from sleep    - SEVERE (8-10):  excruciating pain, unable to do any normal activities      moderate 4. CAUSE: "What do you think is causing the pains?"     Vascular surgeon 5. FEVER: "Have you been having fever?"     no 6. OTHER SYMPTOMS: "Do you have any other symptoms?" (e.g., chest pain, weakness, rash, cold or flu symptoms, weight loss)     Left from CVA 7. PREGNANCY: "Is there any chance you are pregnant?" "When was your last menstrual period?"     N/a  Protocols used: Muscle Aches and Body Pain-A-AH

## 2022-08-04 NOTE — Telephone Encounter (Signed)
Muscle spasms   Pt called reporting that she has been having shoulder and neck spasms, no appt soon enough  Best contact: [phone]      Attempted to reach pt, VM not set up, unable to leave message to call back.

## 2022-08-15 ENCOUNTER — Other Ambulatory Visit: Payer: Self-pay | Admitting: Family

## 2022-08-15 DIAGNOSIS — K219 Gastro-esophageal reflux disease without esophagitis: Secondary | ICD-10-CM

## 2022-08-17 ENCOUNTER — Encounter: Payer: Self-pay | Admitting: Internal Medicine

## 2022-08-17 ENCOUNTER — Ambulatory Visit: Payer: BLUE CROSS/BLUE SHIELD | Attending: Internal Medicine | Admitting: Internal Medicine

## 2022-08-17 ENCOUNTER — Encounter: Payer: Self-pay | Admitting: *Deleted

## 2022-08-17 ENCOUNTER — Ambulatory Visit: Payer: BLUE CROSS/BLUE SHIELD | Admitting: Physician Assistant

## 2022-08-17 ENCOUNTER — Encounter: Payer: Self-pay | Admitting: Physician Assistant

## 2022-08-17 VITALS — BP 150/99 | HR 97 | Ht 67.0 in | Wt 234.4 lb

## 2022-08-17 VITALS — BP 147/103 | HR 85 | Ht 67.0 in | Wt 233.0 lb

## 2022-08-17 DIAGNOSIS — M62838 Other muscle spasm: Secondary | ICD-10-CM | POA: Diagnosis not present

## 2022-08-17 DIAGNOSIS — I639 Cerebral infarction, unspecified: Secondary | ICD-10-CM

## 2022-08-17 NOTE — Patient Instructions (Signed)
Medication Instructions:  Your physician recommends that you continue on your current medications as directed. Please refer to the Current Medication list given to you today.  *If you need a refill on your cardiac medications before your next appointment, please call your pharmacy*  Testing/Procedures: [employer] Cardiac Event Monitor Instructions  Your physician has requested you wear your cardiac event monitor for __30___ days, (1-30). [employer] may call or text to confirm a shipping address. The monitor will be sent to a land address via UPS. [employer] will not ship a monitor to a PO BOX. It typically takes 3-5 days to receive your monitor after it has been enrolled. [employer] will assist with USPS tracking if your package is delayed. The telephone number for [employer] is [phone]. Once you have received your monitor, please review the enclosed instructions. Instruction tutorials can also be viewed under help and settings on the enclosed cell phone. Your monitor has already been registered assigning a specific monitor serial # to you.  Billing and Self Pay Discount Information  [employer] has been provided the insurance information we had on file for you.  If your insurance has been updated, please call [employer] at [phone] to provide them with your updated insurance information.   [employer] offers a discounted Self Pay option for patients who have insurance that does not cover their cardiac event monitor or patients without insurance.  The discounted cost of a Self Pay Cardiac Event Monitor would be $225.00 , if the patient contacts [employer] at [phone] within 7 days of applying the monitor to make payment arrangements.  If the patient does not contact [employer] within 7 days of applying the monitor, the cost of the cardiac event monitor will be $350.00.  Applying the monitor  Remove cell phone from case and turn it on. The cell phone works as IT consultant and  needs to be within [employer] of you at all times. The cell phone will need to be charged on a daily basis. We recommend you plug the cell phone into the enclosed charger at your bedside table every night.  Monitor batteries: You will receive two monitor batteries labelled #1 and #2. These are your recorders. Plug battery #2 onto the second connection on the enclosed charger. Keep one battery on the charger at all times. This will keep the monitor battery deactivated. It will also keep it fully charged for when you need to switch your monitor batteries. A small light will be blinking on the battery emblem when it is charging. The light on the battery emblem will remain on when the battery is fully charged.  Open package of a Monitor strip. Insert battery #1 into black hood on strip and gently squeeze monitor battery onto connection as indicated in instruction booklet. Set aside while preparing skin.  Choose location for your strip, vertical or horizontal, as indicated in the instruction booklet. Shave to remove all hair from location. There cannot be any lotions, oils, powders, or colognes on skin where monitor is to be applied. Wipe skin clean with enclosed Saline wipe. Dry skin completely.  Peel paper labeled #1 off the back of the Monitor strip exposing the adhesive. Place the monitor on the chest in the vertical or horizontal position shown in the instruction booklet. One arrow on the monitor strip must be pointing upward. Carefully remove paper labeled #2, attaching remainder of strip to your skin. Try not to create any folds or wrinkles in the strip as you apply it.  Firmly press and  release the circle in the center of the monitor battery. You will hear a small beep. This is turning the monitor battery on. The heart emblem on the monitor battery will light up every 5 seconds if the monitor battery in turned on and connected to the patient securely. Do not push and hold the circle down  as this turns the monitor battery off. The cell phone will locate the monitor battery. A screen will appear on the cell phone checking the connection of your monitor strip. This may read poor connection initially but change to good connection within the next minute. Once your monitor accepts the connection you will hear a series of 3 beeps followed by a climbing crescendo of beeps. A screen will appear on the cell phone showing the two monitor strip placement options. Touch the picture that demonstrates where you applied the monitor strip.  Your monitor strip and battery are waterproof. You are able to shower, bathe, or swim with the monitor on. They just ask you do not submerge deeper than 3 feet underwater. We recommend removing the monitor if you are swimming in a lake, river, or ocean.  Your monitor battery will need to be switched to a fully charged monitor battery approximately once a week. The cell phone will alert you of an action which needs to be made.  On the cell phone, tap for details to reveal connection status, monitor battery status, and cell phone battery status. The green dots indicates your monitor is in good status. A red dot indicates there is something that needs your attention.  To record a symptom, click the circle on the monitor battery. In 30-60 seconds a list of symptoms will appear on the cell phone. Select your symptom and tap save. Your monitor will record a sustained or significant arrhythmia regardless of you clicking the button. Some patients do not feel the heart rhythm irregularities. [employer] will notify us of any serious or critical events.  Refer to instruction booklet for instructions on switching batteries, changing strips, the Do not disturb or Pause features, or any additional questions.  Call [employer] at [phone], to confirm your monitor is transmitting and record your baseline. They will answer any questions you may have regarding the  monitor instructions at that time.  Returning the monitor to [employer]  Place all equipment back into blue box. Peel off strip of paper to expose adhesive and close box securely. There is a prepaid UPS shipping label on this box. Drop in a UPS drop box, or at a UPS facility like [employer]. You may also contact [employer] to arrange UPS to pick up monitor package at your home.  Follow-Up: At [facility], you and your health needs are our priority.  As part of our continuing mission to provide you with exceptional heart care, we have created designated Provider Care Teams.  These Care Teams include your primary Cardiologist (physician) and Advanced Practice Providers (APPs -  Physician Assistants and Nurse Practitioners) who all work together to provide you with the care you need, when you need it.  We recommend signing up for the patient portal called "MyChart".  Sign up information is provided on this After Visit Summary.  MyChart is used to connect with patients for Virtual Visits (Telemedicine).  Patients are able to view lab/test results, encounter notes, upcoming appointments, etc.  Non-urgent messages can be sent to your provider as well.   To learn more about what you can do with MyChart, go to [email or web address].  Your next appointment:    AS NEEDED with Dr. Wyline Mood

## 2022-08-17 NOTE — Progress Notes (Signed)
Established Patient Office Visit  Subjective   Patient ID: Connie Herrera, female    DOB: 1976-03-11  Age: 47 y.o. MRN: [id number]  Chief Complaint  Patient presents with   Spasms    Symptoms of body cramping, described as spasms moving to different muscles.     States that she has been experiencing intermittent muscle cramps in her neck, shoulders, arms, legs, feet for "at least a year" states that she feels they became worse or having a stroke at the beginning of this year.  States that she has previously been prescribed Flexeril, states that it does offer relief, but she states that she would like to know why she is experiencing these cramping instead of just masking the cramping.  States that she is taking her iron supplementation on a daily basis.  States that she drinks less than 1 bottle of water a day.  States that she is being treated for vitamin D deficiency, states that she did start the medication for 1 month, weekly dosing, and then recently restarted it approximately 2 weeks ago after learning she was supposed to take it for 12 weeks.        Past Medical History:  Diagnosis Date   Brain lesion    COVID-19 04/2020   CVA (cerebral vascular accident) (HCC) 04/2022   Diplopia    Hypertension    Iron deficiency anemia    Social History   Socioeconomic History   Marital status: Single    Spouse name: Not on file   Number of children: 4   Years of education: 14   Highest education level: Some college, no degree  Occupational History   Occupation: Dietary Supervisor    Comment: [employer]  Tobacco Use   Smoking status: Former    Packs/day: 0.50    Years: 4.00    Additional pack years: 0.00    Total pack years: 2.00    Types: Cigarettes    Quit date: 06/04/2017    Years since quitting: 5.2    Passive exposure: Past   Smokeless tobacco: Never  Vaping Use   Vaping Use: Never used  Substance and Sexual Activity   Alcohol use: No   Drug use: Not  Currently    Types: Marijuana    Comment: 09/30/20 maybe once a week   Sexual activity: Yes    Birth control/protection: Surgical  Other Topics Concern   Not on file  Social History Narrative   Patient lives at home with children.    Patient has 4 children.    Patient is single.    Patient has 2 years of college.    Patient is right handed.    Patient is working full time. [employer].    Social Determinants of Health   Financial Resource Strain: Not on file  Food Insecurity: No Food Insecurity (04/10/2022)   Hunger Vital Sign    Worried About Running Out of Food in the Last Year: Never true    Ran Out of Food in the Last Year: Never true  Transportation Needs: No Transportation Needs (04/10/2022)   PRAPARE - Administrator, Civil Service (Medical): No    Lack of Transportation (Non-Medical): No  Physical Activity: Not on file  Stress: Not on file  Social Connections: Not on file  Intimate Partner Violence: Not At Risk (04/10/2022)   Humiliation, Afraid, Rape, and Kick questionnaire    Fear of Current or Ex-Partner: No    Emotionally Abused:  No    Physically Abused: No    Sexually Abused: No   Family History  Problem Relation Age of Onset   Healthy Mother    Cirrhosis Father    Cancer Maternal Grandmother 56       breast   Breast cancer Maternal Grandmother    No Known Allergies  Review of Systems  Constitutional: Negative.   HENT: Negative.    Eyes: Negative.   Respiratory:  Negative for shortness of breath.   Cardiovascular:  Negative for chest pain.  Gastrointestinal: Negative.   Genitourinary: Negative.   Musculoskeletal: Negative.   Skin: Negative.   Neurological: Negative.   Endo/Heme/Allergies: Negative.   Psychiatric/Behavioral: Negative.        Objective:     BP (!) 147/103 (BP Location: Left Arm, Patient Position: Sitting, Cuff Size: Large)   Pulse 85   Ht 5\' 7"  (1.702 m)   Wt 233 lb (105.7 kg)   SpO2 99%   BMI 36.49 kg/m   BP Readings from Last 3 Encounters:  08/17/22 (!) 147/103  08/17/22 (!) 150/99  06/17/22 126/81   Wt Readings from Last 3 Encounters:  08/17/22 233 lb (105.7 kg)  08/17/22 234 lb 6.4 oz (106.3 kg)  06/17/22 235 lb 11.2 oz (106.9 kg)      Physical Exam Vitals and nursing note reviewed.  Constitutional:      Appearance: Normal appearance.  HENT:     Head: Normocephalic and atraumatic.     Right Ear: External ear normal.     Left Ear: External ear normal.     Nose: Nose normal.     Mouth/Throat:     Mouth: Mucous membranes are moist.     Pharynx: Oropharynx is clear.  Eyes:     Extraocular Movements: Extraocular movements intact.     Conjunctiva/sclera: Conjunctivae normal.     Pupils: Pupils are equal, round, and reactive to light.  Cardiovascular:     Rate and Rhythm: Normal rate and regular rhythm.     Pulses: Normal pulses.     Heart sounds: Normal heart sounds.  Pulmonary:     Effort: Pulmonary effort is normal.     Breath sounds: Normal breath sounds.  Musculoskeletal:        General: Normal range of motion.     Cervical back: Normal range of motion and neck supple.  Skin:    General: Skin is warm and dry.  Neurological:     General: No focal deficit present.     Mental Status: She is alert and oriented to person, place, and time.  Psychiatric:        Mood and Affect: Mood normal.        Behavior: Behavior normal.        Thought Content: Thought content normal.        Judgment: Judgment normal.        Assessment & Plan:   Problem List Items Addressed This Visit   None  1. Muscle spasms of both lower extremities Reviewed recent CMP with patient.  Patient encouraged to increase water intake, continue Flexeril as needed.  Patient to follow-up with mobile unit in 1 week.  Patient has previously had prescription for gabapentin for this condition, but states that she did not want to try it due to feeling scared.  Consider trial gabapentin during follow-up if  water increase does not offer relief.  Patient understands and agrees.  2. Muscle spasms of neck   3. Other muscle  spasm    I have reviewed the patient's medical history (PMH, PSH, Social History, Family History, Medications, and allergies) , and have been updated if relevant. I spent 20 minutes reviewing chart and  face to face time with patient.    Return if symptoms worsen or fail to improve.    Kasandra Knudsen Mayers, PA-C

## 2022-08-17 NOTE — Telephone Encounter (Signed)
Requested Prescriptions  Pending Prescriptions Disp Refills   pantoprazole (PROTONIX) 40 MG tablet [Pharmacy Med Name: PANTOPRAZOLE SOD DR 40 MG TAB] 90 tablet 2    Sig: TAKE 1 TABLET BY MOUTH EVERY DAY     Gastroenterology: Proton Pump Inhibitors Passed - 08/15/2022  9:19 AM      Passed - Valid encounter within last 12 months    Recent Outpatient Visits           2 months ago Encounter to establish care   [facility] [place], Shea Stakes, NP   2 months ago Primary hypertension   [facility], [place], NP   3 months ago Hospital discharge follow-up   [facility], [place], NP   4 months ago Encounter to establish care   [facility], Amy J, NP   3 years ago Muscle spasm    [facility] Hoy Register, MD       Future Appointments             Today Mayers, Kasandra Knudsen, PA-C [facility]   In 1 month Claiborne Rigg, NP [facility]

## 2022-08-17 NOTE — Progress Notes (Signed)
Cardiology Office Note:    Date:  08/17/2022   ID:  Connie Herrera, DOB Feb 21, 1976, MRN [id number]  PCP:  Claiborne Rigg, NP   [facility] Providers Cardiologist:  Maisie Fus, MD     Referring MD: Rema Fendt, NP   No chief complaint on file. Post CVA  History of Present Illness:    Connie Herrera is a 47 y.o. 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 She notes heart fluttering. She is menstruating. She has fibroids. She is taking her oral iron, planned for IV iron. Worried about exercising. No family hx of cardiac dx.   In January, she presented with L arm weakness and numbness. She underwent MRI which showed multiple small areas of diffusion hyperintense signal in the occipital parietal lobes bilaterally. Some of these lesions do mild enhancement. Appearance is nonspecific but could be seen with subacute infarct. Demyelinating disease a consideration but not typical. Also consider inflammatory process such as sarcoid, vasculitis, lupus and PRES. 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 Enhancement is somewhat atypical however is stable. Her BP is not well controlled.  She had a CT angio which showed Filling defect in the right brachiocephalic artery, concerning for intraluminal thrombus. She is s/p stenting with carotid cutdown. TEE was recommended for for embolic source. Hypercoag w/u negative so far. She was placed on eliquis.  Her referral was listed as palpitations. This is  a minor issue for her.   BP  140/100 mmHg  Interim Hx 08/17/2022 Notes she is having spasms all over her body. No palpitations.  She notes she did not take her BP medications. She is in pain today as well. BP 150/99 mmHg. Otherwise no issues  Past Medical History:  Diagnosis Date   Brain lesion    COVID-19 04/2020   CVA (cerebral vascular accident) (HCC) 04/2022   Diplopia    Hypertension    Iron deficiency anemia     Past Surgical History:  Procedure Laterality Date   AORTOGRAM N/A 04/14/2022   Procedure: DIAGNOSTIC AORTOGRAM;  Surgeon: Victorino Sparrow, MD;  Location: [facility] OR;  Service: Vascular;  Laterality: N/A;   BUBBLE STUDY  06/15/2022   Procedure: BUBBLE STUDY;  Surgeon: Maisie Fus, MD;  [facility] ENDOSCOPY;  Service: Cardiovascular;;   ENDARTERECTOMY Right 04/14/2022   Procedure: CAROTID CUTDOWN WITH BRACHIOCEPHALIC STENTING, WITH ULTRASOUND OF THE RIGHT FEMORAL VEIN;  Surgeon: Victorino Sparrow, MD;  Location: [facility] OR;  Service: Vascular;  Laterality: Right;   TEE WITHOUT CARDIOVERSION N/A 06/15/2022   Procedure: TRANSESOPHAGEAL ECHOCARDIOGRAM (TEE);  Surgeon: Maisie Fus, MD;  Location: [facility] ENDOSCOPY;  Service: Cardiovascular;  Laterality: N/A;   TUBAL LIGATION      Current Medications: No outpatient medications have been marked as taking for the 08/17/22 encounter (Appointment) with Maisie Fus, MD.     Allergies:   Patient has no known allergies.   Social History   Socioeconomic History   Marital status: Single    Spouse name: Not on file   Number of children: 4   Years of education: 14   Highest education  level: Some college, no degree  Occupational History   Occupation: Dietary Supervisor    Comment: [employer]  Tobacco Use   Smoking status: Former    Packs/day: 0.50    Years: 4.00    Additional pack years: 0.00    Total pack years: 2.00    Types: Cigarettes    Quit date: 06/04/2017    Years since quitting: 5.2    Passive exposure: Past   Smokeless tobacco: Never  Vaping  Use   Vaping Use: Never used  Substance and Sexual Activity   Alcohol use: No   Drug use: Not Currently    Types: Marijuana    Comment: 09/30/20 maybe once a week   Sexual activity: Yes    Birth control/protection: Surgical  Other Topics Concern   Not on file  Social History Narrative   Patient lives at home with children.    Patient has 4 children.    Patient is single.    Patient has 2 years of college.    Patient is right handed.    Patient is working full time. [employer].    Social Determinants of Health   Financial Resource Strain: Not on file  Food Insecurity: No Food Insecurity (04/10/2022)   Hunger Vital Sign    Worried About Running Out of Food in the Last Year: Never true    Ran Out of Food in the Last Year: Never true  Transportation Needs: No Transportation Needs (04/10/2022)   PRAPARE - Administrator, Civil Service (Medical): No    Lack of Transportation (Non-Medical): No  Physical Activity: Not on file  Stress: Not on file  Social Connections: Not on file     Family History: The patient's family history includes Breast cancer in her maternal grandmother; Cancer (age of onset: 35) in her maternal grandmother; Cirrhosis in her father; Healthy in her mother.  ROS:   Please see the history of present illness.     All other systems reviewed and are negative.  EKGs/Labs/Other Studies Reviewed:    The following studies were reviewed today:   EKG:  EKG is  ordered today.  The ekg ordered today demonstrates   2/12- NSR  Recent Labs: 04/10/2022: TSH 2.461 04/16/2022: Magnesium 2.0 05/18/2022: Hemoglobin 10.6; Platelets 436 06/16/2022: ALT 28; BUN 8; Creatinine, Ser 0.72; Potassium 4.2; Sodium 138  Recent Lipid Panel    Component Value Date/Time   CHOL 138 04/10/2022 0443   CHOL 138 03/07/2019 0831   TRIG 171 (H) 04/10/2022 0443   HDL 36 (L) 04/10/2022 0443   HDL 30 (L) 03/07/2019 0831   CHOLHDL 3.8 04/10/2022 0443   VLDL 34 04/10/2022  0443   LDLCALC 68 04/10/2022 0443   LDLCALC 87 03/07/2019 0831     Risk Assessment/Calculations:     Physical Exam:    VS:   There were no vitals filed for this visit.    Wt Readings from Last 3 Encounters:  06/17/22 235 lb 11.2 oz (106.9 kg)  06/16/22 234 lb (106.1 kg)  06/15/22 232 lb (105.2 kg)     GEN:  Well nourished, well developed in no acute distress HEENT: Normal NECK: No JVD; No carotid bruits LYMPHATICS: No lymphadenopathy CARDIAC: RRR, no murmurs, rubs, gallops RESPIRATORY:  Clear to auscultation without rales, wheezing or rhonchi  ABDOMEN: Soft, non-tender, non-distended MUSCULOSKELETAL:  No edema; No deformity  SKIN: Warm and dry NEUROLOGIC:  Alert and oriented x 3 PSYCHIATRIC:  Normal affect   ASSESSMENT:  CVA: LDL 68 mg/dL, at goal. BP is a contributing factor. Will work on this. Continue eliquis. No hx of afib. TEE did not show an embolic source. To be comprehensive will get a 30 day monitor to ensure afib is not contributing  HTN: notes Bps at home are well controlled.  Continue norvasc 10 mg daily and losartan 25 mg daily  HLD: continue crestor 10 mg daily  R brachiocephalic artery clot: s/p stenting. She is off eliquis. Hypercoag w/u negative PLAN:    In order of problems listed above:   30 days [employer]ice Follow up PRN    Medication Adjustments/Labs and Tests Ordered: Current medicines are reviewed at length with the patient today.  Concerns regarding medicines are outlined above.  No orders of the defined types were placed in this encounter.  No orders of the defined types were placed in this encounter.   There are no Patient Instructions on file for this visit.   Signed, Maisie Fus, MD  08/17/2022 7:59 AM    [facility]

## 2022-08-17 NOTE — Patient Instructions (Signed)
To help with your muscle aches and cramps, I encourage you to increase your water intake, you should drink at least 64 ounces of water every day.  Continue using your Flexeril as needed.  If this does not offer relief, please feel free to return to the mobile unit next week for further evaluation.  I included information about neuropathy for your reading pleasure  Roney Jaffe, PA-C Physician Assistant [facility] [email or web address]   Neuropathic Pain Neuropathic pain is pain caused by damage to the nerves that are responsible for certain sensations in your body (sensory nerves). Neuropathic pain can make you more sensitive to pain. Even a minor sensation can feel very painful. This is usually a long-term (chronic) condition that can be difficult to treat. The type of pain differs from person to person. It may: Start suddenly (acute), or it may develop slowly and become chronic. Come and go as damaged nerves heal, or it may stay at the same level for years. Cause emotional distress, loss of sleep, and a lower quality of life. What are the causes? The most common cause of this condition is diabetes. Many other diseases and conditions can also cause neuropathic pain. Causes of neuropathic pain can be classified as: Toxic. This is caused by medicines and chemicals. The most common causes of toxic neuropathic pain is damage from medicines that kill cancer cells (chemotherapy) or alcohol abuse. Metabolic. This can be caused by: Diabetes. Lack of vitamins like B12. Traumatic. Any injury that cuts, crushes, or stretches a nerve can cause damage and pain. Compression-related. If a sensory nerve gets trapped or compressed for a long period of time, the blood supply to the nerve can be cut off. Vascular. Many blood vessel diseases can cause neuropathic pain by decreasing blood supply and oxygen to nerves. Autoimmune. This type of pain results  from diseases in which the body's defense system (immune system) mistakenly attacks sensory nerves. Examples of autoimmune diseases that can cause neuropathic pain include lupus and multiple sclerosis. Infectious. Many types of viral infections can damage sensory nerves and cause pain. Shingles infection is a common cause of this type of pain. Inherited. Neuropathic pain can be a symptom of many diseases that are passed down through families (genetic). What increases the risk? You are more likely to develop this condition if: You have diabetes. You smoke. You drink too much alcohol. You are taking certain medicines, including chemotherapy or medicines that treat immune system disorders. What are the signs or symptoms? The main symptom is pain. Neuropathic pain is often described as: Burning. Shock-like. Stinging. Hot or cold. Itching. How is this diagnosed? No single test can diagnose neuropathic pain. It is diagnosed based on: A physical exam and your symptoms. Your health care provider will ask you about your pain. You may be asked to use a pain scale to describe how bad your pain is. Tests. These may be done to see if you have a cause and location of any nerve damage. They include: Nerve conduction studies and electromyography to test how well nerve signals travel through your nerves and muscles (electrodiagnostic testing). Skin biopsy to evaluate for small fiber neuropathy. Imaging studies, such as: X-rays. CT scan. MRI. How is this treated? Treatment for neuropathic pain may change over time. You may need to try different treatment options or a combination of treatments. Some options include: Treating the underlying cause of the neuropathy, such as diabetes, kidney disease, or vitamin deficiencies. Stopping medicines that can cause neuropathy,  such as chemotherapy. Medicine to relieve pain. Medicines may include: Prescription or over-the-counter pain medicine. Anti-seizure  medicine. Antidepressant medicines. Pain-relieving patches or creams that are applied to painful areas of skin. A medicine to numb the area (local anesthetic), which can be injected as a nerve block. Transcutaneous nerve stimulation. This uses electrical currents to block painful nerve signals. The treatment is painless. Alternative treatments, such as: Acupuncture. Meditation. Massage. Occupational or physical therapy. Pain management programs. Counseling. Follow these instructions at home: Medicines  Take over-the-counter and prescription medicines only as told by your health care provider. Ask your health care provider if the medicine prescribed to you: Requires you to avoid driving or using machinery. Can cause constipation. You may need to take these actions to prevent or treat constipation: Drink enough fluid to keep your urine pale yellow. Take over-the-counter or prescription medicines. Eat foods that are high in fiber, such as beans, whole grains, and fresh fruits and vegetables. Limit foods that are high in fat and processed sugars, such as fried or sweet foods. Lifestyle  Have a good support system at home. Consider joining a chronic pain support group. Do not use any products that contain nicotine or tobacco. These products include cigarettes, chewing tobacco, and vaping devices, such as e-cigarettes. If you need help quitting, ask your health care provider. Do not drink alcohol. General instructions Learn as much as you can about your condition. Work closely with all your health care providers to find the treatment plan that works best for you. Ask your health care provider what activities are safe for you. Keep all follow-up visits. This is important. Contact a health care provider if: Your pain treatments are not working. You are having side effects from your medicines. You are struggling with tiredness (fatigue), mood changes, depression, or anxiety. Get help  right away if: You have thoughts of hurting yourself. Get help right away if you feel like you may hurt yourself or others, or have thoughts about taking your own life. Go to your nearest emergency room or: Call 911. Call the National Suicide Prevention Lifeline at 703-596-1736 or 988. This is open 24 hours a day. Text the Crisis Text Line at 484-017-4365. Summary Neuropathic pain is pain caused by damage to the nerves that are responsible for certain sensations in your body (sensory nerves). Neuropathic pain may come and go as damaged nerves heal, or it may stay at the same level for years. Neuropathic pain is usually a long-term condition that can be difficult to treat. Consider joining a chronic pain support group. This information is not intended to replace advice given to you by your health care provider. Make sure you discuss any questions you have with your health care provider. Document Revised: 11/18/2020 Document Reviewed: 11/18/2020 Elsevier Patient Education  2023 [employer].

## 2022-08-17 NOTE — Progress Notes (Signed)
Patient enrolled for [employer] to ship a 30 day cardiac event monitor to her address on file. 

## 2022-08-19 ENCOUNTER — Telehealth: Payer: Self-pay

## 2022-08-19 NOTE — Telephone Encounter (Signed)
Copied from CRM [id number]. Topic: Referral - Request for Referral >> Aug 19, 2022 11:45 AM Everette C wrote: Has patient seen PCP for this complaint? Yes.   *If NO, is insurance requiring patient see PCP for this issue before PCP can refer them? Referral for which specialty: OBGYN  Preferred provider/office: Patient would like to be seen by a female gynecologist  Reason for referral: Patient has a fibroid concern

## 2022-08-19 NOTE — Telephone Encounter (Signed)
Copied from CRM [id number]. Topic: Referral - Request for Referral >> Aug 19, 2022 11:44 AM Everette C wrote: Has patient seen PCP for this complaint? Yes.   *If NO, is insurance requiring patient see PCP for this issue before PCP can refer them? Referral for which specialty: Dermatology  Preferred provider/office: Patient has no preference  Reason for referral: skin tag removal and arm concern

## 2022-08-20 NOTE — Telephone Encounter (Signed)
Patient identified by name and date of birth.  Patient was given information to contact dermatology and endocrinology.

## 2022-08-21 NOTE — Telephone Encounter (Signed)
She is a patient at [facility] for [facility] at [facility]  She can call them and request a female OB GYN instead of Dr. Donavan Foil whom she just saw in March.

## 2022-08-24 NOTE — Telephone Encounter (Signed)
Patient is aware of response.

## 2022-08-26 ENCOUNTER — Encounter (HOSPITAL_COMMUNITY): Payer: Self-pay

## 2022-08-26 ENCOUNTER — Emergency Department (HOSPITAL_BASED_OUTPATIENT_CLINIC_OR_DEPARTMENT_OTHER): Payer: BLUE CROSS/BLUE SHIELD

## 2022-08-26 ENCOUNTER — Other Ambulatory Visit: Payer: Self-pay

## 2022-08-26 ENCOUNTER — Emergency Department (HOSPITAL_COMMUNITY): Payer: BLUE CROSS/BLUE SHIELD

## 2022-08-26 ENCOUNTER — Emergency Department (HOSPITAL_BASED_OUTPATIENT_CLINIC_OR_DEPARTMENT_OTHER)
Admission: EM | Admit: 2022-08-26 | Discharge: 2022-08-26 | Disposition: A | Discharge status: Home / Self Care | Payer: BLUE CROSS/BLUE SHIELD | Attending: Emergency Medicine | Admitting: Emergency Medicine

## 2022-08-26 DIAGNOSIS — R200 Anesthesia of skin: Secondary | ICD-10-CM

## 2022-08-26 DIAGNOSIS — R209 Unspecified disturbances of skin sensation: Secondary | ICD-10-CM | POA: Insufficient documentation

## 2022-08-26 DIAGNOSIS — Z79899 Other long term (current) drug therapy: Secondary | ICD-10-CM | POA: Insufficient documentation

## 2022-08-26 DIAGNOSIS — Z8673 Personal history of transient ischemic attack (TIA), and cerebral infarction without residual deficits: Secondary | ICD-10-CM | POA: Diagnosis not present

## 2022-08-26 DIAGNOSIS — Z7982 Long term (current) use of aspirin: Secondary | ICD-10-CM | POA: Diagnosis not present

## 2022-08-26 DIAGNOSIS — Z7901 Long term (current) use of anticoagulants: Secondary | ICD-10-CM | POA: Insufficient documentation

## 2022-08-26 DIAGNOSIS — R531 Weakness: Secondary | ICD-10-CM | POA: Insufficient documentation

## 2022-08-26 LAB — CBC WITH DIFFERENTIAL/PLATELET
Abs Immature Granulocytes: 0.02 10*3/uL (ref 0.00–0.07)
Basophils Absolute: 0 10*3/uL (ref 0.0–0.1)
Basophils Relative: 1 %
Eosinophils Absolute: 0.1 10*3/uL (ref 0.0–0.5)
Eosinophils Relative: 1 %
HCT: 37.2 % (ref 36.0–46.0)
Hemoglobin: 11.8 g/dL — ABNORMAL LOW (ref 12.0–15.0)
Immature Granulocytes: 0 %
Lymphocytes Relative: 25 %
Lymphs Abs: 2.1 10*3/uL (ref 0.7–4.0)
MCH: 26.1 pg (ref 26.0–34.0)
MCHC: 31.7 g/dL (ref 30.0–36.0)
MCV: 82.3 fL (ref 80.0–100.0)
Monocytes Absolute: 0.6 10*3/uL (ref 0.1–1.0)
Monocytes Relative: 7 %
Neutro Abs: 5.8 10*3/uL (ref 1.7–7.7)
Neutrophils Relative %: 66 %
Platelets: 356 10*3/uL (ref 150–400)
RBC: 4.52 MIL/uL (ref 3.87–5.11)
RDW: 15.4 % (ref 11.5–15.5)
WBC: 8.6 10*3/uL (ref 4.0–10.5)
nRBC: 0 % (ref 0.0–0.2)

## 2022-08-26 LAB — COMPREHENSIVE METABOLIC PANEL
ALT: 23 U/L (ref 0–44)
AST: 14 U/L — ABNORMAL LOW (ref 15–41)
Albumin: 4.1 g/dL (ref 3.5–5.0)
Alkaline Phosphatase: 56 U/L (ref 38–126)
Anion gap: 11 (ref 5–15)
BUN: 13 mg/dL (ref 6–20)
CO2: 22 mmol/L (ref 22–32)
Calcium: 8.8 mg/dL — ABNORMAL LOW (ref 8.9–10.3)
Chloride: 103 mmol/L (ref 98–111)
Creatinine, Ser: 0.7 mg/dL (ref 0.44–1.00)
GFR, Estimated: >60 mL/min (ref >60)
Glucose, Bld: 104 mg/dL — ABNORMAL HIGH (ref 70–99)
Potassium: 3.5 mmol/L (ref 3.5–5.1)
Sodium: 136 mmol/L (ref 135–145)
Total Bilirubin: 0.2 mg/dL — ABNORMAL LOW (ref 0.3–1.2)
Total Protein: 6.8 g/dL (ref 6.5–8.1)

## 2022-08-26 LAB — PROTIME-INR
INR: 0.9 (ref 0.8–1.2)
Prothrombin Time: 12.8 seconds (ref 11.4–15.2)

## 2022-08-26 NOTE — ED Triage Notes (Addendum)
A+Ox4. Ambulatory to room. Steady on feet. Reports numbness and tremor in left arm- estimated to start around 1800 5/21. Strength equal bilaterally UE and LE. Face symmetrical. No other deficits observed. Denies cp, sob, HA, dizziness, vision changes.   No thinners. ASA 81mg  daily. Previous stroke reported 04/2022. Has plan with cardiologist to wear heart monitor.

## 2022-08-26 NOTE — Discharge Instructions (Addendum)
Your MRI did not show any stroke today.  Symptoms you are experiencing may be related to your previous stroke.  Please keep your scheduled follow-up with your neurologist.

## 2022-08-26 NOTE — ED Notes (Signed)
Report called to Alphonzo Lemmings, RN at [facility] ED

## 2022-08-26 NOTE — ED Notes (Signed)
RT in to assess patient d/t patient stating she felt like she was wheezing. BS CTAB, no distress noted. Dry cough noted. SpO2 100%. EDP notified.

## 2022-08-26 NOTE — ED Provider Notes (Signed)
Patient transferred for MRI.  Patient with history of stroke resulting in left upper extremity weakness was seen at the med center earlier and transferred for MRI to rule out recurrent stroke.  MRI has been performed and is negative.  Patient reports that she has had some tremors of her arm as well.  Reviewing her records reveals multiple visits to PCP with various areas of muscle spasms and tremors.  With a normal MRI, no evidence of new stroke.  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  Patient was seen several months ago in the ED and had CT angiography of her left arm that did not show any abnormality.  She has pulses palpable on exam and I do not feel that she requires repeat angiography of the arm.  Patient has follow-up with her neurologist scheduled for next month.   Gilda Crease, MD 08/26/22 [phone]

## 2022-08-26 NOTE — ED Triage Notes (Signed)
Patient transferred from [facility] for mri.

## 2022-08-26 NOTE — ED Notes (Signed)
Patient states she feels like she is wheezing. RT requested to bedside to listen to patient. Please see RT assessment.

## 2022-08-26 NOTE — ED Notes (Signed)
Called [facility] to transport patient to [facility] for MRI--Dr. Pollina accepting  

## 2022-08-26 NOTE — ED Provider Notes (Signed)
[place] EMERGENCY DEPARTMENT AT [facility] Provider Note   CSN: [id number] Arrival date & time: 08/26/22  0003     History  Chief Complaint  Patient presents with   Numbness    Connie Herrera is a 47 y.o. female.  Patient is a 47 year old female with past medical history of stroke in January 2024.  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  Patient was on anticoagulation until approximately 1 month ago when this was discontinued by hematology.  Patient presenting today with complaints of left arm numbness.  This started approximately 6 PM.  She denies to me she is having weakness beyond the residual weakness she had from her prior stroke, but describes a sensation that is similar to what she experienced at that time.  She denies any involvement of the face or leg.  She denies any headache or visual disturbances.  She denies any chest pain or difficulty breathing.  The history is provided by the patient.       Home Medications Prior to Admission medications   Medication Sig Start Date End Date Taking? Authorizing Provider  acyclovir (ZOVIRAX) 200 MG capsule TAKE 2 CAPSULES BY MOUTH 3 TIMES A DAY FOR 5 DAYS AS NEEDED EACH OUTBREAK 07/20/22   Brock Bad, MD  amLODipine (NORVASC) 10 MG tablet Take 1 tablet (10 mg total) by mouth daily. 05/19/22   Maisie Fus, MD  apixaban (ELIQUIS) 5 MG TABS tablet Take 1 tablet (5 mg total) by mouth 2 (two) times daily. 05/19/22 06/18/22  Rema Fendt, NP  aspirin EC 81 MG tablet Take 1 tablet (81 mg total) by mouth daily. Take daily at 6 am. Swallow whole. 06/16/22   Claiborne Rigg, NP  cyclobenzaprine (FLEXERIL) 10 MG tablet Take 10 mg by mouth at bedtime. 06/05/22   [provider]  ferrous sulfate 325 (65 FE) MG EC tablet Take 1 tablet (325 mg total) by mouth 2 (two) times daily. Patient taking differently: Take 1 tablet by mouth  daily with breakfast. 01/08/22   Rachel Moulds, MD  ibuprofen (ADVIL) 800 MG tablet Take 800 mg by mouth every 8 (eight) hours as needed. 06/12/22   [provider]  losartan (COZAAR) 25 MG tablet Take 1 tablet (25 mg total) by mouth daily. 05/19/22   Maisie Fus, MD  naloxone [facility]) nasal spray 4 mg/0.1 mL Place 1 spray into the nose as needed. 04/30/22   [provider]  ondansetron (ZOFRAN) 4 MG tablet Take 1 tablet (4 mg total) by mouth every 8 (eight) hours as needed for nausea or vomiting. 04/17/22   Lanae Boast, MD  oxyCODONE-acetaminophen (PERCOCET) 10-325 MG tablet Take 1 tablet by mouth every 8 (eight) hours as needed for pain. 05/07/22   [provider]  pantoprazole (PROTONIX) 40 MG tablet TAKE 1 TABLET BY MOUTH EVERY DAY 08/17/22   Claiborne Rigg, NP  rosuvastatin (CRESTOR) 10 MG tablet Take 1 tablet (10 mg total) by mouth daily. 06/16/22   Claiborne Rigg, NP  triamcinolone ointment (KENALOG) 0.1 % Apply 1 Application topically 2 (two) times daily. 06/16/22   Claiborne Rigg, NP  Vitamin D, Ergocalciferol, (DRISDOL) 1.25 MG (50000 UNIT) CAPS capsule Take 50,000 Units by mouth every Thursday. 05/07/22   [provider]  hydrochlorothiazide (HYDRODIURIL) 25 MG tablet Take 1 tablet (25 mg total) by mouth daily. Must have office visit for refills  06/20/20 08/13/20  Waldon Merl, PA-C      Allergies    Patient has no known allergies.    Review of Systems   Review of Systems  All other systems reviewed and are negative.   Physical Exam Updated Vital Signs BP 136/83   Pulse 83   Temp 98.4 F (36.9 C) (Oral)   Resp 20   SpO2 98%  Physical Exam Vitals and nursing note reviewed.  Constitutional:      General: She is not in acute distress.    Appearance: She is well-developed. She is not diaphoretic.  HENT:     Head: Normocephalic and atraumatic.  Eyes:     Extraocular Movements: Extraocular movements intact.     Pupils: Pupils are equal,  round, and reactive to light.  Cardiovascular:     Rate and Rhythm: Normal rate and regular rhythm.     Heart sounds: No murmur heard.    No friction rub. No gallop.  Pulmonary:     Effort: Pulmonary effort is normal. No respiratory distress.     Breath sounds: Normal breath sounds. No wheezing.  Abdominal:     General: Bowel sounds are normal. There is no distension.     Palpations: Abdomen is soft.     Tenderness: There is no abdominal tenderness.  Musculoskeletal:        General: Normal range of motion.     Cervical back: Normal range of motion and neck supple.  Skin:    General: Skin is warm and dry.  Neurological:     General: No focal deficit present.     Mental Status: She is alert and oriented to person, place, and time.     Cranial Nerves: No cranial nerve deficit.     Sensory: No sensory deficit.     Motor: Weakness present.     Coordination: Coordination normal.     Comments: Strength is 5 out of 5 in the right upper extremity and bilateral lower extremities.  Biceps flexion is 4+ out of 5 in the left upper extremity.  There is also slight weakness with handgrip.  Sensation is intact all 4 extremities.     ED Results / Procedures / Treatments   Labs (all labs ordered are listed, but only abnormal results are displayed) Labs Reviewed - No data to display  EKG EKG Interpretation  Date/Time:  Wednesday Aug 26 2022 00:09:34 EDT Ventricular Rate:  83 PR Interval:  211 QRS Duration: 93 QT Interval:  386 QTC Calculation: 454 R Axis:   -6 Text Interpretation: Sinus rhythm Ventricular premature complex Prolonged PR interval Confirmed by Geoffery Lyons ([id number]) on 08/26/2022 12:11:45 AM  Radiology No results found.  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  Patient has been off of her anticoagulation for the past month.  She presents today with complaints of left arm numbness which is similar to what she experienced with her prior stroke.  She does have some left arm weakness noted on exam, but is otherwise neurologically intact.  Patient arrives here with stable vital signs and is afebrile.  Workup initiated including CBC, metabolic panel, and PT/INR.  All studies basically unremarkable.  Head CT obtained showing no acute process.  Care has been discussed with Dr. Derry Lory from neurology at  [place].  It is his recommendation that the patient come to [facility] for an MRI to further evaluate and rule out stroke.  Patient will be made an ER to ER transfer so that she can undergo the studies.  I have spoken with Dr. Blinda Leatherwood in the [facility], ER who agrees to accept in transfer.  Final Clinical Impression(s) / ED Diagnoses Final diagnoses:  None    Rx / DC Orders ED Discharge Orders     None         Geoffery Lyons, MD 08/26/22 [phone]

## 2022-09-01 DIAGNOSIS — I639 Cerebral infarction, unspecified: Secondary | ICD-10-CM

## 2022-09-02 ENCOUNTER — Telehealth: Payer: Self-pay | Admitting: Internal Medicine

## 2022-09-02 NOTE — Telephone Encounter (Signed)
Patient numbers are disconnected. Tried x3

## 2022-09-02 NOTE — Telephone Encounter (Signed)
Pt states her monitor adhesive is itching and irritating her really bad. She wants to know what her other options are. Please advise.

## 2022-09-03 NOTE — Telephone Encounter (Signed)
Number states its not in service

## 2022-09-03 NOTE — Telephone Encounter (Signed)
Patient stated that she had the monitor on for two days. She will send it in.

## 2022-09-03 NOTE — Telephone Encounter (Signed)
Pt called stating no one has given her a call back regarding her issue with the monitor break her out. I informed her that a nurse did make 3 attempts to give her a call but the line was disconnected. She stated that wasn't the case but did provide a number to give her a callback on to assist her. Please advise

## 2022-09-03 NOTE — Telephone Encounter (Signed)
Patient states the adhesive from the monitor has broke her out and it has been itching really bad. She has never had a reaction to adhesive before. I did advise her to take the monitor off. She can try benadryl cream for the itching and skin irritation. Will forward to provider for next steps.

## 2022-09-07 ENCOUNTER — Emergency Department (HOSPITAL_COMMUNITY): Payer: BLUE CROSS/BLUE SHIELD

## 2022-09-07 ENCOUNTER — Emergency Department (HOSPITAL_COMMUNITY)
Admission: EM | Admit: 2022-09-07 | Discharge: 2022-09-07 | Disposition: A | Discharge status: Home / Self Care | Payer: BLUE CROSS/BLUE SHIELD | Attending: Emergency Medicine | Admitting: Emergency Medicine

## 2022-09-07 DIAGNOSIS — Z7982 Long term (current) use of aspirin: Secondary | ICD-10-CM | POA: Diagnosis not present

## 2022-09-07 DIAGNOSIS — M79642 Pain in left hand: Secondary | ICD-10-CM | POA: Insufficient documentation

## 2022-09-07 DIAGNOSIS — Z7901 Long term (current) use of anticoagulants: Secondary | ICD-10-CM | POA: Diagnosis not present

## 2022-09-07 DIAGNOSIS — H532 Diplopia: Secondary | ICD-10-CM | POA: Diagnosis present

## 2022-09-07 DIAGNOSIS — M79641 Pain in right hand: Secondary | ICD-10-CM | POA: Insufficient documentation

## 2022-09-07 DIAGNOSIS — Z8673 Personal history of transient ischemic attack (TIA), and cerebral infarction without residual deficits: Secondary | ICD-10-CM | POA: Diagnosis not present

## 2022-09-07 MED ORDER — GADOBUTROL 1 MMOL/ML IV SOLN
10.0000 mL | Freq: Once | INTRAVENOUS | Status: AC | PRN
  Administered 2022-09-07: 10 mL via INTRAVENOUS

## 2022-09-07 NOTE — ED Notes (Signed)
Pt back from MRI 

## 2022-09-07 NOTE — ED Notes (Signed)
Pt left AMA decided to not wait to see the provider and wait for discharge instructions.

## 2022-09-07 NOTE — ED Provider Notes (Signed)
[place] [facility]MENT AT [facility] Provider Note   CSN: [id number] Arrival date & time: 09/07/22  1050     History  Chief Complaint  Patient presents with   Blurred Vision   Hand Pain    "Sore"    Connie Herrera is a 47 y.o. female.  HPI 47 year old female history of stroke in January, thrombectomy, has had anticoagulation stopped by hematology presents today complaining of episode of double vision.  She is not currently complaining of any focal neurological symptoms.  States the episode occurred this morning and lasted about a minute.  She denied any numbness, tingling or weakness at that time.  She reports taking her medications as prescribed.     Home Medications Prior to Admission medications   Medication Sig Start Date End Date Taking? Authorizing Provider  amLODipine (NORVASC) 10 MG tablet Take 1 tablet (10 mg total) by mouth daily. 05/19/22  Yes Maisie Fus, MD  aspirin EC 81 MG tablet Take 1 tablet (81 mg total) by mouth daily. Take daily at 6 am. Swallow whole. 06/16/22  Yes Claiborne Rigg, NP  losartan (COZAAR) 25 MG tablet Take 1 tablet (25 mg total) by mouth daily. 05/19/22  Yes Maisie Fus, MD  pantoprazole (PROTONIX) 40 MG tablet TAKE 1 TABLET BY MOUTH EVERY DAY 08/17/22  Yes Claiborne Rigg, NP  rosuvastatin (CRESTOR) 10 MG tablet Take 1 tablet (10 mg total) by mouth daily. 06/16/22  Yes Claiborne Rigg, NP  acyclovir (ZOVIRAX) 200 MG capsule TAKE 2 CAPSULES BY MOUTH 3 TIMES A DAY FOR 5 DAYS AS NEEDED EACH OUTBREAK 07/20/22   Brock Bad, MD  apixaban (ELIQUIS) 5 MG TABS tablet Take 1 tablet (5 mg total) by mouth 2 (two) times daily. 05/19/22 06/18/22  Rema Fendt, NP  cyclobenzaprine (FLEXERIL) 10 MG tablet Take 10 mg by mouth at bedtime. 06/05/22   [provider]  ferrous sulfate 325 (65 FE) MG EC tablet Take 1 tablet (325 mg total) by mouth 2 (two) times daily. Patient taking differently: Take 1 tablet by mouth daily  with breakfast. 01/08/22   Rachel Moulds, MD  ibuprofen (ADVIL) 800 MG tablet Take 800 mg by mouth every 8 (eight) hours as needed. 06/12/22   [provider]  naloxone [facility]) nasal spray 4 mg/0.1 mL Place 1 spray into the nose as needed. 04/30/22   [provider]  ondansetron (ZOFRAN) 4 MG tablet Take 1 tablet (4 mg total) by mouth every 8 (eight) hours as needed for nausea or vomiting. 04/17/22   Lanae Boast, MD  oxyCODONE-acetaminophen (PERCOCET) 10-325 MG tablet Take 1 tablet by mouth every 8 (eight) hours as needed for pain. 05/07/22   [provider]  triamcinolone ointment (KENALOG) 0.1 % Apply 1 Application topically 2 (two) times daily. 06/16/22   Claiborne Rigg, NP  Vitamin D, Ergocalciferol, (DRISDOL) 1.25 MG (50000 UNIT) CAPS capsule Take 50,000 Units by mouth every Thursday. 05/07/22   [provider]  hydrochlorothiazide (HYDRODIURIL) 25 MG tablet Take 1 tablet (25 mg total) by mouth daily. Must have office visit for refills 06/20/20 08/13/20  Waldon Merl, PA-C      Allergies    Patient has no known allergies.    Review of Systems   Review of Systems  Physical Exam Updated Vital Signs BP 124/89   Pulse 78   Temp 98.4 F (36.9 C) (Oral)   Resp 17   Ht 1.702 m (5\' 7" )  Wt 104.3 kg   SpO2 99%   BMI 36.02 kg/m  Physical Exam Vitals and nursing note reviewed.  Constitutional:      Appearance: She is well-developed.  HENT:     Head: Normocephalic and atraumatic.     Right Ear: Tympanic membrane and external ear normal.     Left Ear: Tympanic membrane and external ear normal.     Nose: Nose normal.     Right Sinus: No maxillary sinus tenderness or frontal sinus tenderness.     Left Sinus: No maxillary sinus tenderness or frontal sinus tenderness.  Eyes:     Extraocular Movements:     Right eye: No nystagmus.     Left eye: No nystagmus.     Conjunctiva/sclera: Conjunctivae normal.     Pupils: Pupils are equal, round, and reactive  to light.  Cardiovascular:     Rate and Rhythm: Normal rate and regular rhythm.     Heart sounds: Normal heart sounds.  Pulmonary:     Effort: Pulmonary effort is normal. No respiratory distress.     Breath sounds: Normal breath sounds.  Chest:     Chest wall: No tenderness.  Abdominal:     General: Bowel sounds are normal. There is no distension.     Palpations: Abdomen is soft. There is no mass.     Tenderness: There is no abdominal tenderness.  Musculoskeletal:        General: No tenderness. Normal range of motion.     Cervical back: Normal range of motion and neck supple.  Skin:    General: Skin is warm and dry.     Capillary Refill: Capillary refill takes less than 2 seconds.     Findings: No rash.  Neurological:     Mental Status: She is alert and oriented to person, place, and time.     GCS: GCS eye subscore is 4. GCS verbal subscore is 5. GCS motor subscore is 6.     Cranial Nerves: No cranial nerve deficit.     Sensory: No sensory deficit.     Motor: No weakness.     Coordination: Coordination normal.     Gait: Gait normal.     Deep Tendon Reflexes: Reflexes are normal and symmetric. Reflexes normal. Babinski sign absent on the right side. Babinski sign absent on the left side.     Reflex Scores:      Brachioradialis reflexes are 2+ on the left side.      Patellar reflexes are 2+ on the right side and 2+ on the left side.    Comments:  Speech is normal without dysarthria, dysphasia, or aphasia. Muscle strength is 5/5   Psychiatric:        Behavior: Behavior normal.        Thought Content: Thought content normal.        Judgment: Judgment normal.     ED Results / Procedures / Treatments   Labs (all labs ordered are listed, but only abnormal results are displayed) Labs Reviewed  CBC  BASIC METABOLIC PANEL    EKG None  Radiology MR Brain W and Wo Contrast  Result Date: 09/07/2022 CLINICAL DATA:  Neuro deficit, acute, stroke suspected. Blurred vision and  bilateral hand soreness. EXAM: MRI HEAD WITHOUT AND WITH CONTRAST TECHNIQUE: Multiplanar, multiecho pulse sequences of the brain and surrounding structures were obtained without and with intravenous contrast. CONTRAST:  10mL GADAVIST GADOBUTROL 1 MMOL/ML IV SOLN COMPARISON:  MRI brain 08/26/2022. FINDINGS: Brain: No acute  infarct or hemorrhage. 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 No mass effect or midline shift. Unchanged moderate chronic small-vessel disease. No hydrocephalus or extra-axial collection. Vascular: Normal flow voids and vessel enhancement. Skull and upper cervical spine: Normal marrow signal and enhancement. Sinuses/Orbits: Unremarkable. Other: None. IMPRESSION: 1. No acute intracranial process. 2. 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 Electronically Signed   By: Orvan Falconer M.D.   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 Radiology: ordered.  Risk Prescription drug management.   47 year old female history of stroke presents today with concern for episode of double vision that occurred this morning and lasted about 1 minute.  She denies any other symptoms. Differential diagnosis includes but is not limited to intracranial abnormalities, including stroke or mass extraocular motor abnormalities, including MS Patient was evaluated here with MRI which shows  unchanged 7 mm cavernoma. Patient had labs ordered that were not completed prior to patient decided to leave AGAINST MEDICAL ADVICE. Patient left after I was informed but left before I was able to Discussed findings or risks with her       Final Clinical Impression(s) / ED Diagnoses Final diagnoses:  Diplopia    Rx / DC Orders ED Discharge Orders     None         Margarita Grizzle, MD 09/07/22 1657

## 2022-09-07 NOTE — ED Notes (Signed)
Pt to MRI

## 2022-09-07 NOTE — ED Triage Notes (Signed)
Pt BIB EMS from home for blurred vision and bilateral hand soreness. Pt is concerned about stroke d/t similar symptoms preceding previous stroke. A&Ox4, ABCs intact, NAD noted at this time.   EMS  154/83 79 HR 16 RR 100 % RA BG 101

## 2022-09-21 ENCOUNTER — Other Ambulatory Visit: Payer: Self-pay

## 2022-09-21 NOTE — Progress Notes (Signed)
   Connie Herrera [birth year] [id number]  Patient outreached by Mack Guise , PharmD Candidate on 09/21/2022.  Blood Pressure Readings: Last documented ambulatory systolic blood pressure: 147 Last documented ambulatory diastolic blood pressure: 103 Does the patient have a validated home blood pressure machine?: No They report home readings NA  Medication review was performed. Is the patient taking their medications as prescribed?: Yes Differences from their prescribed list include: NA  The following barriers to adherence were noted: Does the patient have cost concerns?: No Does the patient have transportation concerns?: No Does the patient need assistance obtaining refills?: No Does the patient occassionally forget to take some of their prescribed medications?: No Does the patient feel like one/some of their medications make them feel poorly?: No Does the patient have questions or concerns about their medications?: No Does the patient have a follow up scheduled with their primary care provider/cardiologist?: Yes   Interventions: Interventions Completed: Medications were reviewed  The patient has follow up scheduled:  PCP: Claiborne Rigg, NP   Mack Guise, Student-PharmD

## 2022-09-22 ENCOUNTER — Ambulatory Visit: Payer: Medicaid Other | Attending: Nurse Practitioner | Admitting: Nurse Practitioner

## 2022-09-22 ENCOUNTER — Encounter: Payer: Self-pay | Admitting: Nurse Practitioner

## 2022-09-22 DIAGNOSIS — Z1211 Encounter for screening for malignant neoplasm of colon: Secondary | ICD-10-CM

## 2022-09-22 DIAGNOSIS — R531 Weakness: Secondary | ICD-10-CM

## 2022-09-22 NOTE — Progress Notes (Unsigned)
Assessment & Plan:  Connie Herrera was seen today for hypertension.  Diagnoses and all orders for this visit:  Left-sided weakness -     Ambulatory referral to Physical Therapy  Colon cancer screening -     Ambulatory referral to Gastroenterology    Patient has been counseled on age-appropriate routine health concerns for screening and prevention. These are reviewed and up-to-date. Referrals have been placed accordingly. Immunizations are up-to-date or declined.    Subjective:   Chief Complaint  Patient presents with   Hypertension   HPI Connie Herrera 47 y.o. 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  She has not had any physical therapy. No history of afib. TEE no embolic source to stroke. No longer taking eliquis. Taking ASA only. Neg hypercoag work up.   Today she is very upset stating she can not work and would like a note stating that she can no longer work due to left arm pain and numbness since her stroke. States she can not hold any objects for an extended length of time in her left hand. Associated symptoms include muscle spasms and memory issues. Normal MMSE today.      09/22/2022   10:38 AM  MMSE - Mini Mental State Exam  Orientation to time 5  Orientation to Place 5  Registration 3  Attention/ Calculation 5  Recall 3  Language- name 2 objects 2  Language- repeat 1  Language- follow 3 step command 3  Language- read & follow direction 1  Write a sentence 1  Copy design 0  Total score 29    BP Readings from Last 3 Encounters:  09/22/22 136/85  09/07/22 124/89  08/26/22 (!) 141/98     Review of Systems  Constitutional:  Negative for fever, malaise/fatigue and weight loss.  HENT: Negative.  Negative for nosebleeds.   Eyes: Negative.  Negative for blurred vision, double vision and  photophobia.  Respiratory: Negative.  Negative for cough and shortness of breath.   Cardiovascular: Negative.  Negative for chest pain, palpitations and leg swelling.  Gastrointestinal: Negative.  Negative for heartburn, nausea and vomiting.  Musculoskeletal:  Positive for joint pain and myalgias.  Neurological:  Positive for sensory change and weakness. Negative for dizziness, focal weakness, seizures and headaches.  Psychiatric/Behavioral:  Positive for memory loss. Negative for suicidal ideas.     Past Medical History:  Diagnosis Date   Brain lesion    COVID-19 04/2020   CVA (cerebral vascular accident) (HCC) 04/2022   Diplopia    Hypertension    Iron deficiency anemia     Past Surgical History:  Procedure Laterality Date   AORTOGRAM N/A 04/14/2022   Procedure: DIAGNOSTIC AORTOGRAM;  Surgeon: Victorino Sparrow, MD;  Location: [facility] OR;  Service: Vascular;  Laterality: N/A;   BUBBLE STUDY  06/15/2022   Procedure: BUBBLE STUDY;  Surgeon: Maisie Fus, MD;  Location: [facility] ENDOSCOPY;  Service: Cardiovascular;;   ENDARTERECTOMY Right 04/14/2022   Procedure: CAROTID CUTDOWN WITH BRACHIOCEPHALIC STENTING, WITH ULTRASOUND OF THE RIGHT FEMORAL VEIN;  Surgeon: Victorino Sparrow, MD;  Location: [facility] OR;  Service: Vascular;  Laterality: Right;   TEE WITHOUT CARDIOVERSION N/A 06/15/2022   Procedure: TRANSESOPHAGEAL ECHOCARDIOGRAM (TEE);  Surgeon: Maisie Fus, MD;  Location[facility] ENDOSCOPY;  Service: Cardiovascular;  Laterality: N/A;   TUBAL LIGATION  Family History  Problem Relation Age of Onset   Healthy Mother    Cirrhosis Father    Cancer Maternal Grandmother 23       breast   Breast cancer Maternal Grandmother     Social History Reviewed with no changes to be made today.   Outpatient Medications Prior to Visit  Medication Sig Dispense Refill   acyclovir (ZOVIRAX) 200 MG capsule TAKE 2 CAPSULES BY MOUTH 3 TIMES A DAY FOR 5 DAYS AS NEEDED EACH OUTBREAK 30 capsule 6   amLODipine  (NORVASC) 10 MG tablet Take 1 tablet (10 mg total) by mouth daily. 90 tablet 3   aspirin EC 81 MG tablet Take 1 tablet (81 mg total) by mouth daily. Take daily at 6 am. Swallow whole. 90 tablet 3   cyclobenzaprine (FLEXERIL) 10 MG tablet Take 10 mg by mouth at bedtime.     ferrous sulfate 325 (65 FE) MG EC tablet Take 1 tablet (325 mg total) by mouth 2 (two) times daily. (Patient taking differently: Take 1 tablet by mouth daily with breakfast.) 60 tablet 3   ibuprofen (ADVIL) 800 MG tablet Take 800 mg by mouth every 8 (eight) hours as needed.     losartan (COZAAR) 25 MG tablet Take 1 tablet (25 mg total) by mouth daily. 90 tablet 3   naloxone (NARCAN) nasal spray 4 mg/0.1 mL Place 1 spray into the nose as needed.     ondansetron (ZOFRAN) 4 MG tablet Take 1 tablet (4 mg total) by mouth every 8 (eight) hours as needed for nausea or vomiting. 20 tablet 0   oxyCODONE-acetaminophen (PERCOCET) 10-325 MG tablet Take 1 tablet by mouth every 8 (eight) hours as needed for pain.     pantoprazole (PROTONIX) 40 MG tablet TAKE 1 TABLET BY MOUTH EVERY DAY 90 tablet 2   rosuvastatin (CRESTOR) 10 MG tablet Take 1 tablet (10 mg total) by mouth daily. 90 tablet 3   triamcinolone ointment (KENALOG) 0.1 % Apply 1 Application topically 2 (two) times daily. 30 g 0   Vitamin D, Ergocalciferol, (DRISDOL) 1.25 MG (50000 UNIT) CAPS capsule Take 50,000 Units by mouth every Thursday.     apixaban (ELIQUIS) 5 MG TABS tablet Take 1 tablet (5 mg total) by mouth 2 (two) times daily. 60 tablet 0   No facility-administered medications prior to visit.    No Known Allergies     Objective:    BP 136/85 (BP Location: Left Arm, Patient Position: Sitting, Cuff Size: Large)   Pulse 77   Ht 5\' 7"  (1.702 m)   Wt 232 lb (105.2 kg)   LMP 09/15/2022 (Exact Date)   SpO2 100%   BMI 36.34 kg/m  Wt Readings from Last 3 Encounters:  09/22/22 232 lb (105.2 kg)  09/07/22 230 lb (104.3 kg)  08/26/22 232 lb 12.9 oz (105.6 kg)     Physical Exam Vitals and nursing note reviewed.  Constitutional:      Appearance: She is well-developed.  HENT:     Head: Normocephalic and atraumatic.  Cardiovascular:     Rate and Rhythm: Normal rate and regular rhythm.     Heart sounds: Normal heart sounds. No murmur heard.    No friction rub. No gallop.  Pulmonary:     Effort: Pulmonary effort is normal. No tachypnea or respiratory distress.     Breath sounds: Normal breath sounds. No decreased breath sounds, wheezing, rhonchi or rales.  Chest:     Chest wall: No tenderness.  Abdominal:  General: Bowel sounds are normal.     Palpations: Abdomen is soft.  Musculoskeletal:        General: Normal range of motion.     Cervical back: Normal range of motion.  Skin:    General: Skin is warm and dry.  Neurological:     Mental Status: She is alert and oriented to person, place, and time.     Coordination: Coordination normal.  Psychiatric:        Behavior: Behavior normal. Behavior is cooperative.        Thought Content: Thought content normal.        Judgment: Judgment normal.          Patient has been counseled extensively about nutrition and exercise as well as the importance of adherence with medications and regular follow-up. The patient was given clear instructions to go to ER or return to medical center if symptoms don't improve, worsen or new problems develop. The patient verbalized understanding.   Follow-up: Return in about 3 months (around 12/23/2022), or if symptoms worsen or fail to improve.   Claiborne Rigg, FNP-BC [facility] [place], [place] [phone]   09/23/2022, 7:45 PM

## 2022-09-23 ENCOUNTER — Encounter: Payer: Self-pay | Admitting: Nurse Practitioner

## 2022-09-25 ENCOUNTER — Ambulatory Visit (HOSPITAL_COMMUNITY): Admission: RE | Admit: 2022-09-25 | Payer: BLUE CROSS/BLUE SHIELD | Source: Ambulatory Visit

## 2022-09-29 ENCOUNTER — Ambulatory Visit (INDEPENDENT_AMBULATORY_CARE_PROVIDER_SITE_OTHER): Payer: BLUE CROSS/BLUE SHIELD | Admitting: Diagnostic Neuroimaging

## 2022-09-29 ENCOUNTER — Encounter: Payer: Self-pay | Admitting: Diagnostic Neuroimaging

## 2022-09-29 VITALS — BP 140/86 | HR 82 | Ht 67.0 in | Wt 232.0 lb

## 2022-09-29 DIAGNOSIS — M79602 Pain in left arm: Secondary | ICD-10-CM | POA: Diagnosis not present

## 2022-09-29 DIAGNOSIS — I6340 Cerebral infarction due to embolism of unspecified cerebral artery: Secondary | ICD-10-CM | POA: Diagnosis not present

## 2022-09-29 DIAGNOSIS — M79604 Pain in right leg: Secondary | ICD-10-CM

## 2022-09-29 DIAGNOSIS — M79605 Pain in left leg: Secondary | ICD-10-CM

## 2022-09-29 NOTE — Progress Notes (Signed)
[facility]  PATIENT: Connie Herrera DOB: Feb 27, 1976  REFERRING CLINICIAN: Claiborne Rigg, NP HISTORY FROM: patient  REASON FOR VISIT: follow up   HISTORICAL  CHIEF COMPLAINT:  Chief Complaint  Patient presents with   Follow-up   Cerebrovascular Accident    Rm 6,  Pt here for 4 mo follow up for stroke. States left are hurts, spasms on legs and fingers on left hand twitching. Unable to hold things on left hand for long periods of time. Questions if this symptoms are related to her stroke.       HISTORY OF PRESENT ILLNESS:   UPDATE (09/29/22, VRP): Since last visit, doing well, except had left sided numbness in 08/26/22 (MRI negative) and then blurred vision 09/07/22 (MRI neg again). Now off eliquis per hematology.   UPDATE (06/02/22, VRP): Since last visit, patient admitted to hospital in January 2024 for left-sided numbness and weakness.  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  Also had large right brachiocephalic artery intraluminal thrombus and was started on heparin.  This was treated with right carotid cutdown and retrograde innominate artery stenting. She had extensive hypercoagulable workup which was unremarkable.  She has followed up with hematology and vascular surgery.  Since that time patient is stable.  She has transesophageal echocardiogram scheduled for June 15, 2022.  Otherwise tolerating her medications.   UPDATE (09/30/20, VRP): Since last visit, patient continues to have significant stress and anxiety.  She also had multiple emergency room visits for lower extremity pain, cramps, muscle spasms.  She had evaluation of MRI cervical and thoracic spine which were unremarkable.  Lab testing were unremarkable except recently CK was slightly elevated.  Patient having significant stress at home and becomes tearful when discussing this.  PRIOR HPI: 47 year old female here for evaluation of transient double vision, headaches, abnormal  MRI.  04/14/2020 patient went to the emergency room due to several days of blurred vision and double vision. Patient noticed horizontal double vision which seem to be coming from her right eye according to the patient. If she closed 1 eye double vision would go away. Patient went to the hospital for evaluation. CT of the head was obtained which showed hyperdensity in the left lentiform nuclei. Follow-up MRI brain confirmed a small enhancing lesion, possibly a cavernous malformation, but slightly atypical. Patient was noted to have right lateral rectus palsy on exam in the emergency room. She also had some hypertension. She was having some body aches and malaise. She had Covid testing done which was positive. She was discharged home with follow-up.  Since that time patient has noted her blood pressure has been staying high. Her Covid infection symptoms have improved. Her double vision symptoms also improved over several days.  Today patient was feeling some anxiety and stress related to this appointment. She has had some headache today.  Of note in 2013 patient had MRI of the brain which showed nonspecific T2 hyperintensities, and possibility of MS versus small vessel disease was raised. Apparently she enrolled in a clinical research study at that time and had lumbar puncture which was normal. And therefore MS was ruled out at that time.   REVIEW OF SYSTEMS: Full 14 system review of systems performed and negative with exception of: As per HPI.  ALLERGIES: No Known Allergies   HOME MEDICATIONS: Outpatient Medications Prior to Visit  Medication Sig Dispense Refill   amLODipine (NORVASC) 10 MG tablet Take 1 tablet (10 mg total) by mouth daily. 90 tablet  3   aspirin EC 81 MG tablet Take 1 tablet (81 mg total) by mouth daily. Take daily at 6 am. Swallow whole. 90 tablet 3   cyclobenzaprine (FLEXERIL) 10 MG tablet Take 10 mg by mouth at bedtime.     ferrous sulfate 325 (65 FE) MG EC tablet Take 1  tablet (325 mg total) by mouth 2 (two) times daily. (Patient taking differently: Take 1 tablet by mouth daily with breakfast.) 60 tablet 3   ibuprofen (ADVIL) 800 MG tablet Take 800 mg by mouth every 8 (eight) hours as needed.     losartan (COZAAR) 25 MG tablet Take 1 tablet (25 mg total) by mouth daily. 90 tablet 3   naloxone (NARCAN) nasal spray 4 mg/0.1 mL Place 1 spray into the nose as needed.     ondansetron (ZOFRAN) 4 MG tablet Take 1 tablet (4 mg total) by mouth every 8 (eight) hours as needed for nausea or vomiting. 20 tablet 0   oxyCODONE-acetaminophen (PERCOCET) 10-325 MG tablet Take 1 tablet by mouth every 8 (eight) hours as needed for pain.     pantoprazole (PROTONIX) 40 MG tablet TAKE 1 TABLET BY MOUTH EVERY DAY 90 tablet 2   rosuvastatin (CRESTOR) 10 MG tablet Take 1 tablet (10 mg total) by mouth daily. 90 tablet 3   triamcinolone ointment (KENALOG) 0.1 % Apply 1 Application topically 2 (two) times daily. 30 g 0   Vitamin D, Ergocalciferol, (DRISDOL) 1.25 MG (50000 UNIT) CAPS capsule Take 50,000 Units by mouth every Thursday.     acyclovir (ZOVIRAX) 200 MG capsule TAKE 2 CAPSULES BY MOUTH 3 TIMES A DAY FOR 5 DAYS AS NEEDED EACH OUTBREAK 30 capsule 6   apixaban (ELIQUIS) 5 MG TABS tablet Take 1 tablet (5 mg total) by mouth 2 (two) times daily. 60 tablet 0   No facility-administered medications prior to visit.    PAST MEDICAL HISTORY: Past Medical History:  Diagnosis Date   Brain lesion    COVID-19 04/2020   CVA (cerebral vascular accident) (HCC) 04/2022   Diplopia    Hypertension    Iron deficiency anemia     PAST SURGICAL HISTORY: Past Surgical History:  Procedure Laterality Date   AORTOGRAM N/A 04/14/2022   Procedure: DIAGNOSTIC AORTOGRAM;  Surgeon: Victorino Sparrow, MD;  Location: [facility] OR;  Service: Vascular;  Laterality: N/A;   BUBBLE STUDY  06/15/2022   Procedure: BUBBLE STUDY;  Surgeon: Maisie Fus, MD;  Location: [facility] ENDOSCOPY;  Service: Cardiovascular;;    ENDARTERECTOMY Right 04/14/2022   Procedure: CAROTID CUTDOWN WITH BRACHIOCEPHALIC STENTING, WITH ULTRASOUND OF THE RIGHT FEMORAL VEIN;  Surgeon: Victorino Sparrow, MD;  Location: [facility] OR;  Service: Vascular;  Laterality: Right;   TEE WITHOUT CARDIOVERSION N/A 06/15/2022   Procedure: TRANSESOPHAGEAL ECHOCARDIOGRAM (TEE);  Surgeon: Maisie Fus, MD;  Location: [facility] ENDOSCOPY;  Service: Cardiovascular;  Laterality: N/A;   TUBAL LIGATION      FAMILY HISTORY: Family History  Problem Relation Age of Onset   Healthy Mother    Cirrhosis Father    Cancer Maternal Grandmother 99       breast   Breast cancer Maternal Grandmother     SOCIAL HISTORY: Social History   Socioeconomic History   Marital status: Single    Spouse name: Not on file   Number of children: 4   Years of education: 14   Highest education level: Some college, no degree  Occupational History   Occupation: Dietary Supervisor    Comment: [employer]  Tobacco Use   Smoking status: Former    Packs/day: 0.50    Years: 4.00    Additional pack years: 0.00    Total pack years: 2.00    Types: Cigarettes    Quit date: 06/04/2017    Years since quitting: 5.3    Passive exposure: Past   Smokeless tobacco: Never  Vaping Use   Vaping Use: Never used  Substance and Sexual Activity   Alcohol use: No   Drug use: Not Currently    Types: Marijuana    Comment: 09/30/20 maybe once a week   Sexual activity: Yes    Birth control/protection: Surgical  Other Topics Concern   Not on file  Social History Narrative   Patient lives at home with children.    Patient has 4 children.    Patient is single.    Patient has 2 years of college.    Patient is right handed.    Patient is working full time. [employer].    Social Determinants of Health   Financial Resource Strain: Not on file  Food Insecurity: No Food Insecurity (04/10/2022)   Hunger Vital Sign    Worried About Running Out of Food in the Last Year: Never true    Ran  Out of Food in the Last Year: Never true  Transportation Needs: No Transportation Needs (04/10/2022)   PRAPARE - Administrator, Civil Service (Medical): No    Lack of Transportation (Non-Medical): No  Physical Activity: Not on file  Stress: Not on file  Social Connections: Not on file  Intimate Partner Violence: Not At Risk (04/10/2022)   Humiliation, Afraid, Rape, and Kick questionnaire    Fear of Current or Ex-Partner: No    Emotionally Abused: No    Physically Abused: No    Sexually Abused: No     PHYSICAL EXAM  GENERAL EXAM/CONSTITUTIONAL: Vitals:  Vitals:   09/29/22 1359  BP: (!) 140/86  Pulse: 82  Weight: 232 lb (105.2 kg)  Height: 5\' 7"  (1.702 m)   Body mass index is 36.34 kg/m. Wt Readings from Last 3 Encounters:  09/29/22 232 lb (105.2 kg)  09/22/22 232 lb (105.2 kg)  09/07/22 230 lb (104.3 kg)   Patient is in no distress; well developed, nourished and groomed; neck is supple  CARDIOVASCULAR: Examination of carotid arteries is normal; no carotid bruits Regular rate and rhythm, no murmurs Examination of peripheral vascular system by observation and palpation is normal DISCOLORATION, HYPERPIGMENTATION OF FINGERTIPS; RIGHT WORSE THAN LEFT  EYES: Ophthalmoscopic exam of optic discs and posterior segments is normal; no papilledema or hemorrhages No results found.  MUSCULOSKELETAL: Gait, strength, tone, movements noted in Neurologic exam below  NEUROLOGIC: MENTAL STATUS:     09/22/2022   10:38 AM  MMSE - Mini Mental State Exam  Orientation to time 5  Orientation to Place 5  Registration 3  Attention/ Calculation 5  Recall 3  Language- name 2 objects 2  Language- repeat 1  Language- follow 3 step command 3  Language- read & follow direction 1  Write a sentence 1  Copy design 0  Total score 29   awake, alert, oriented to person, place and time recent and remote memory intact normal attention and concentration language fluent,  comprehension intact, naming intact fund of knowledge appropriate  CRANIAL NERVE:  2nd - no papilledema on fundoscopic exam 2nd, 3rd, 4th, 6th - pupils equal and reactive to light, visual fields full to confrontation, extraocular muscles intact,  no nystagmus 5th - facial sensation symmetric 7th - facial strength symmetric 8th - hearing intact 9th - palate elevates symmetrically, uvula midline 11th - shoulder shrug symmetric 12th - tongue protrusion midline  MOTOR:  normal bulk and tone, full strength in the BUE, BLE  SENSORY:  normal and symmetric to light touch, temperature, vibration  COORDINATION:  finger-nose-finger, fine finger movements normal  REFLEXES:  deep tendon reflexes present and symmetric  GAIT/STATION:  narrow based gait    DIAGNOSTIC DATA (LABS, IMAGING, TESTING) - I reviewed patient records, labs, notes, testing and imaging myself where available.  Lab Results  Component Value Date   WBC 8.6 08/26/2022   HGB 11.8 (L) 08/26/2022   HCT 37.2 08/26/2022   MCV 82.3 08/26/2022   PLT 356 08/26/2022      Component Value Date/Time   NA 136 08/26/2022 0027   NA 138 06/16/2022 1156   K 3.5 08/26/2022 0027   CL 103 08/26/2022 0027   CO2 22 08/26/2022 0027   GLUCOSE 104 (H) 08/26/2022 0027   BUN 13 08/26/2022 0027   BUN 8 06/16/2022 1156   CREATININE 0.70 08/26/2022 0027   CALCIUM 8.8 (L) 08/26/2022 0027   PROT 6.8 08/26/2022 0027   PROT 6.6 06/16/2022 1156   ALBUMIN 4.1 08/26/2022 0027   ALBUMIN 3.9 06/16/2022 1156   ALBUMIN 3.8 05/05/2021 1044   AST 14 (L) 08/26/2022 0027   ALT 23 08/26/2022 0027   ALKPHOS 56 08/26/2022 0027   BILITOT 0.2 (L) 08/26/2022 0027   BILITOT <0.2 06/16/2022 1156   GFRNONAA >60 08/26/2022 0027   GFRAA 96 01/09/2020 1351   Lab Results  Component Value Date   CHOL 138 04/10/2022   HDL 36 (L) 04/10/2022   LDLCALC 68 04/10/2022   TRIG 171 (H) 04/10/2022   CHOLHDL 3.8 04/10/2022   Lab Results  Component Value Date    HGBA1C 6.0 (H) 04/10/2022   Lab Results  Component Value Date   VITAMINB12 329 04/10/2022   Lab Results  Component Value Date   TSH 2.461 04/10/2022   Lab Results  Component Value Date   CKTOTAL 50 04/22/2022   Total CK  Date Value Ref Range Status  04/22/2022 50 38 - 234 U/L Final    Comment:    Performed at [facility], [address] [place], [place] [address]  02/12/2021 112 38 - 234 U/L Final    Comment:    Performed at [facility], [address], [place], [place] [address]  10/03/2020 306 (H) 38 - 234 U/L Final    Comment:    Performed at [facility], [address], [place], [place] [address]  09/30/2020 181 32 - 182 U/L Final  09/10/2020 298 (H) 38 - 234 U/L Final    Comment:    Performed at [facility], [address] [place], [place] [address]   05/14/22  Onko[employer] Advanced NGS JAK2, MPL and CALR Reflex to Myeloid Report  RESULT SUMMARY: NORMAL DETECTED GENOMIC ALTERATIONS: No Mutations Identified TUMOR TYPE: Not Provided CLINICAL INFORMATION: Provided ICD-10 code: D50.9 (iron deficiency anemia, unspecified). PERTINENT NEGATIVE RESULTS The following genes are NEGATIVE for clinically relevant mutations. Mutational hotspots and surrounding exonic regions were interrogated for DNA level point mutations and indels (fusions not assayed). ABL1, ANKRD26, ASXL1, ATRX, BCOR, BCORL1, BRAF, CALR, CBL, CCND2, CDKN2A, CEBPA, CSF3R, CUX1, DDX41, DNMT3A, ETNK1, ETV6, EZH2, FBXW7, FLT3, GATA2, HRAS, IDH1, IDH2, JAK2, KDM6A, KIT, KMT2A, KRAS, MAP2K1, MPL, MYD88, NF1,  NPM1, NRAS, PDGFRA, PHF6, PTEN, PTPN11, RUNX1, SETBP1, SF3B1, SRSF2, STAG2, TET2, TP53, U2AF1, WT1, ZRSR2  Component 2 wk ago  Interpretation Comment  Comment: (NOTE) Peripheral Blood: No evidence of paroxysmal nocturnal hemoglobinuria ([facility])         Component Ref Range & Units 1 mo ago 8 yr ago  Anti Nuclear Antibody (ANA) Negative  Negative Negative         Component Ref Range & Units 1 mo ago  Beta-2 Glyco I IgG 0 - 20 GPI IgG units <9         Component Ref Range & Units 1 mo ago  Anticardiolipin IgG 0 - 14 GPL U/mL <9     Component 1 mo ago  Recommendations-PTGENE: Comment  Comment: (NOTE) Result: c.*97G>A - Not Detected       Component Ref Range & Units 1 mo ago  PTT Lupus Anticoagulant 0.0 - 43.5 sec 25.6  DRVVT 0.0 - 47.0 sec 35.0  Lupus Anticoag Interp  Comment: VC  Comment: (NOTE) No lupus anticoagulant was detected.     Component 1 mo ago  Recommendations-F5LEID: Comment  Comment: (NOTE) Result: c.1601G>A (p.[id number]) - Not Detected   Component Ref Range & Units 1 mo ago  AntiThromb III Func 75 - 120 % 115         Component Ref Range & Units 1 mo ago (04/09/22) 1 mo ago (04/09/22) 1 yr ago (05/05/21)  Tube #  4 1   Color, CSF COLORLESS COLORLESS COLORLESS COLORLESS  Appearance, CSF CLEAR CLEAR Abnormal  CLEAR Abnormal  CLEAR  Supernatant  NOT INDICATED NOT INDICATED   RBC Count, CSF 0 /cu mm 36 High  37 High  0 R  WBC, CSF 0 - 5 /cu mm 0 0 0 R  Other Cells, CSF  TOO FEW TO COUNT, SMEAR AVAILABLE FOR REVIEW TOO FEW TO COUNT, SMEAR AVAILABLE FOR REVIEW CM       Component 1 mo ago  CSF Oligoclonal Bands Comment  Comment: (NOTE) Zero (0) oligoclonal bands were observed in the CSF.     Component Ref Range & Units 1 mo ago (04/09/22) 1 yr ago (05/05/21) 1 yr ago (05/05/21)  Glucose, CSF 40 - 70 mg/dL 59  71 R  Total  Protein, CSF 15 - 45 mg/dL 20 23        Component Ref Range & Units 1 mo ago  VDRL Quant, CSF Non Rea:<1:1 Non Reactive      01/09/2012 MRI brain without contrast - No acute infarction.  - Numerous foci of abnormal white matter signal within the cerebral  hemispheres.  This raises concern for demyelinating  disease/multiple sclerosis.  The differential diagnosis includes an  early manifestation small vessel disease, migraine related foci and  changes related old  head trauma.   11/28/13 EMG / NCS - This is a normal study. No electrodiagnostic evidence of large fiber neuropathy or myopathy at this time.  04/14/2020 MRI brain with and without [I reviewed images myself and agree with interpretation. This is a new finding compared to 2013.  -VRP]  1. 9 mm well-circumscribed lesion involving the lateral margin of the left lentiform nucleus, corresponding with finding on prior CT. 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 However, the associated enhancement about this lesion is somewhat greater and atypical for what is usually seen with these lesions. Given this, a short interval follow-up MRI, with and without contrast, in 3 months is recommended to document stability.  2. No other acute intracranial abnormality. 3. Mild T2/FLAIR hyperintensity involving the supratentorial cerebral white matter, nonspecific, but most commonly related to chronic microvascular ischemic disease.  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  No other abnormalities are noted.  Overall no significant change compared with previous MRI dated 04/14/2020  10/03/20 MRI lumbar spine - Dr. Wilford Corner called to discuss this case. To my review, including review of the thoracic study performed 08/13/2020, I do not believe that there is a cord lesion demonstrated in the distal thoracic cord region. I believe that area is normal. - Findings compatible with a small demyelinating focus in the left hemicord at the level of the T12 inferior endplate. This is likely present on the prior thoracic spine MRI but it is difficult to visualize. No abnormal enhancement after contrast administration. - Shallow disc bulge L5-S1 without stenosis. - Fibroid uterus.  04/09/22 MRI brain 1. Multiple small areas of diffusion hyperintense signal in the occipital  parietal lobes bilaterally. Some of these lesions do mild enhancement. Appearance is nonspecific but could be seen with subacute infarct. Demyelinating disease a consideration but not typical. Also consider inflammatory process such as sarcoid, vasculitis, lupus and PRES. Recommend lumbar puncture for further evaluation. 2. 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 Enhancement is somewhat atypical however is stable. No other areas of hemorrhage. 3. Mild white matter changes in the cerebral hemispheres bilaterally. This may be due to chronic microvascular ischemia. Demyelinating disease not excluded. Correlate with risk factors for small vessel disease.  04/09/22 MRI cervical spine 1. Mild degenerative change in the cervical spine. No significant stenosis. No evidence of spinal infection. 2. 2.5 cm right thyroid nodule. Left thyroid nodule 1.6 cm. (ref: J Am Coll Radiol. 2015 Feb;12(2): 143-50). Recommend thyroid ultrasound for further evaluation.  04/09/22 MRI thoracic spine Mild thoracic degenerative change. No evidence of spinal infection or spinal stenosis in the thoracic spine.  04/09/22 CTA head / neck 1. 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 2. No acute intracranial process. The infarcts seen on the 04/09/2022 MRI are not apparent on CT. 3. No intracranial large vessel occlusion or significant stenosis. 4. No hemodynamically significant stenosis in the neck. 5. The thyroid nodules noted on the 04/09/2022 MRI of the cervical spine are not apparent on this study. Further evaluation with nonemergent ultrasound is recommended. (Reference: J Am Coll Radiol. 2015 Feb;12(2): 143-50)   06/15/22 TEE Conclusion(s)/Recommendation(s): No LA/LAA thrombus  identified. Negative  bubble study for interatrial shunt. No intracardiac source of embolism  detected on this on this transesophageal echocardiogram.       ASSESSMENT AND PLAN  47 y.o. year old female here with 1 week of horizontal double vision in January 2022, with right lateral rectus palsy noted on exam in the emergency room, now resolved. Could represent microvascular ischemic infarct to abducens nerve, related to hypertension. We will proceed with further work-up with lab testing to evaluate for other etiologies. Also was found to have incidental left brain lesion, possible cavernous malformation, will follow up serial imaging.   Dx:  1. Cerebrovascular accident (CVA) due to embolism of cerebral artery (HCC)   2. Pain in both lower extremities   3. Left arm  pain       PLAN:  MULTIPLE EMBOLIC STROKES (unexplained hypercoag state? Vasculitis? 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 Karin Lieu   LEFT ARM PAIN (since 2014; worse in Jan 2024) - follow up with PT/OT; consider sports medicine  HYPERPIGMENTATION OF FINGERTIPS (RIGHT > LEFT) - follow up with PCP; consider dermatology or vascular eval  Hypertension Amlodipine, losartan   Hyperlipidemia LDL 68, goal < 70 rosuvastatin 10mg  daily   Other Stroke Risk Factors Former smoker, quit 4 years ago Substance abuse - UDS:  THC POSITIVE. Patient advised to stop using due to stroke risk. Obesity, Body mass index is 37.93 kg/m., BMI >/= 30 associated with increased stroke risk, recommend weight loss, diet and exercise as appropriate  Obstructive sleep apnea: --> continue CPAP Migraine history   Other Active Problems Thyroid nodules bil: incidental finding on Cervical MRI, [place]  completed 04/10/22.  Biopsy will need to be done in the outpatient setting; follow up with PCP  LOWER EXT MUSCLE PAIN / MYALGIA (elevated CK in past; now normalized) - unclear etiology; prior EMG/NCS and labs unremarkable  TRANSIENT DOUBLE VISION (? right CN6 palsy per ER notes on 04/14/20) - resolved  LEFT BRAIN LESION (? cavernoma; likely incidental finding) - stable in April 2022  CHRONIC SMALL VESSEL ISCHEMIC DISEASE - likely related to high BP; optimize nutrition, exercise and BP control  Return for pending if symptoms worsen or fail to improve.    Suanne Marker, MD 09/29/2022, 3:03 PM Certified in Neurology, Neurophysiology and Neuroimaging  [facility] [address] [place], [place] [address] [phone]

## 2022-09-29 NOTE — Patient Instructions (Signed)
  MULTIPLE EMBOLIC STROKES (unexplained hypercoag state? Vasculitis? 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 Karin Lieu   LEFT ARM PAIN (since 2014; worse in Jan 2024) - follow up with PT/OT; consider sports medicine  HYPERPIGMENTATION OF FINGERTIPS (RIGHT > LEFT) - follow up with PCP; consider dermatology or vascular eval

## 2022-10-01 ENCOUNTER — Ambulatory Visit: Payer: BLUE CROSS/BLUE SHIELD | Admitting: Diagnostic Neuroimaging

## 2022-10-02 ENCOUNTER — Ambulatory Visit: Payer: BLUE CROSS/BLUE SHIELD | Attending: Nurse Practitioner | Admitting: Physical Therapy

## 2022-10-02 ENCOUNTER — Ambulatory Visit: Payer: BLUE CROSS/BLUE SHIELD | Attending: Internal Medicine

## 2022-10-02 DIAGNOSIS — I639 Cerebral infarction, unspecified: Secondary | ICD-10-CM

## 2022-10-05 ENCOUNTER — Ambulatory Visit (HOSPITAL_COMMUNITY): Payer: BLUE CROSS/BLUE SHIELD

## 2022-10-06 ENCOUNTER — Ambulatory Visit: Payer: BLUE CROSS/BLUE SHIELD | Admitting: Physical Therapy

## 2022-10-12 ENCOUNTER — Ambulatory Visit: Payer: Medicaid Other | Admitting: Obstetrics and Gynecology

## 2022-10-20 ENCOUNTER — Ambulatory Visit: Payer: BLUE CROSS/BLUE SHIELD | Attending: Nurse Practitioner | Admitting: Physical Therapy

## 2022-10-28 ENCOUNTER — Telehealth: Payer: BLUE CROSS/BLUE SHIELD | Admitting: Family Medicine

## 2022-10-28 DIAGNOSIS — N760 Acute vaginitis: Secondary | ICD-10-CM | POA: Diagnosis not present

## 2022-10-28 DIAGNOSIS — B9689 Other specified bacterial agents as the cause of diseases classified elsewhere: Secondary | ICD-10-CM | POA: Diagnosis not present

## 2022-10-28 MED ORDER — METRONIDAZOLE 500 MG PO TABS
500.0000 mg | ORAL_TABLET | Freq: Two times a day (BID) | ORAL | 0 refills | Status: AC
Start: 2022-10-28 — End: 2022-11-04

## 2022-10-28 NOTE — Progress Notes (Signed)
E-Visit for Vaginal Symptoms  We are sorry that you are not feeling well. Here is how we plan to help! Based on what you shared with me it looks like you: May have a vaginosis due to bacteria  Vaginosis is an inflammation of the vagina that can result in discharge, itching and pain. The cause is usually a change in the normal balance of vaginal bacteria or an infection. Vaginosis can also result from reduced estrogen levels after menopause.  The most common causes of vaginosis are:   Bacterial vaginosis which results from an overgrowth of one on several organisms that are normally present in your vagina.   Yeast infections which are caused by a naturally occurring fungus called candida.   Vaginal atrophy (atrophic vaginosis) which results from the thinning of the vagina from reduced estrogen levels after menopause.   Trichomoniasis which is caused by a parasite and is commonly transmitted by sexual intercourse.  Factors that increase your risk of developing vaginosis include: Medications, such as antibiotics and steroids Uncontrolled diabetes Use of hygiene products such as bubble bath, vaginal spray or vaginal deodorant Douching Wearing damp or tight-fitting clothing Using an intrauterine device (IUD) for birth control Hormonal changes, such as those associated with pregnancy, birth control pills or menopause Sexual activity Having a sexually transmitted infection  Your treatment plan is Metronidazole or Flagyl 500mg twice a day for 7 days.  I have electronically sent this prescription into the pharmacy that you have chosen.  Be sure to take all of the medication as directed. Stop taking any medication if you develop a rash, tongue swelling or shortness of breath. Mothers who are breast feeding should consider pumping and discarding their breast milk while on these antibiotics. However, there is no consensus that infant exposure at these doses would be harmful.  Remember that  medication creams can weaken latex condoms. .   HOME CARE:  Good hygiene may prevent some types of vaginosis from recurring and may relieve some symptoms:  Avoid baths, hot tubs and whirlpool spas. Rinse soap from your outer genital area after a shower, and dry the area well to prevent irritation. Don't use scented or harsh soaps, such as those with deodorant or antibacterial action. Avoid irritants. These include scented tampons and pads. Wipe from front to back after using the toilet. Doing so avoids spreading fecal bacteria to your vagina.  Other things that may help prevent vaginosis include:  Don't douche. Your vagina doesn't require cleansing other than normal bathing. Repetitive douching disrupts the normal organisms that reside in the vagina and can actually increase your risk of vaginal infection. Douching won't clear up a vaginal infection. Use a latex condom. Both female and female latex condoms may help you avoid infections spread by sexual contact. Wear cotton underwear. Also wear pantyhose with a cotton crotch. If you feel comfortable without it, skip wearing underwear to bed. Yeast thrives in moist environments Your symptoms should improve in the next day or two.  GET HELP RIGHT AWAY IF:  You have pain in your lower abdomen ( pelvic area or over your ovaries) You develop nausea or vomiting You develop a fever Your discharge changes or worsens You have persistent pain with intercourse You develop shortness of breath, a rapid pulse, or you faint.  These symptoms could be signs of problems or infections that need to be evaluated by a medical provider now.  MAKE SURE YOU   Understand these instructions. Will watch your condition. Will get help right   away if you are not doing well or get worse.  Thank you for choosing an e-visit.  Your e-visit answers were reviewed by a board certified advanced clinical practitioner to complete your personal care plan. Depending upon the  condition, your plan could have included both over the counter or prescription medications.  Please review your pharmacy choice. Make sure the pharmacy is open so you can pick up prescription now. If there is a problem, you may contact your provider through MyChart messaging and have the prescription routed to another pharmacy.  Your safety is important to us. If you have drug allergies check your prescription carefully.   For the next 24 hours you can use MyChart to ask questions about today's visit, request a non-urgent call back, or ask for a work or school excuse. You will get an email in the next two days asking about your experience. I hope that your e-visit has been valuable and will speed your recovery.  I provided 5 minutes of non face-to-face time during this encounter for chart review, medication and order placement, as well as and documentation.   

## 2022-11-05 ENCOUNTER — Telehealth: Payer: Self-pay

## 2022-11-05 ENCOUNTER — Telehealth: Payer: Self-pay | Admitting: Diagnostic Neuroimaging

## 2022-11-05 DIAGNOSIS — M79602 Pain in left arm: Secondary | ICD-10-CM

## 2022-11-05 NOTE — Telephone Encounter (Signed)
Copied from CRM [id number]. Topic: General - Inquiry >> Nov 05, 2022  3:22 PM De Blanch wrote: Reason for CRM: Pt stated PCP gave her a letter to take to social services for food stamps. Stated PCP told her she shouldn't work, but she was told the way the letter was written out, it says she's able to work.  Pt is asking to speak with Ms. Meredeth Ide, requesting a new letter.  Please advise.

## 2022-11-05 NOTE — Telephone Encounter (Signed)
Patient called advising she wanted to move forward with nerve induction test and sports medicine referral as discussed with Dr. Vaughan Basta at last visit. Advised patient Dr. Marjory Lies would be back 11/09/22 and would send high priority to him. Patient appreciative of call.

## 2022-11-05 NOTE — Telephone Encounter (Signed)
Pt states she was told to call back to pursue a nerve damage test if there were no relief from her pain.  Pt would very much like to move forward with the referral for the nerve damage test that Dr Marjory Lies told her could be done.

## 2022-11-06 ENCOUNTER — Encounter: Payer: Self-pay | Admitting: Nurse Practitioner

## 2022-11-06 NOTE — Telephone Encounter (Signed)
New letter has been placed in chart

## 2022-11-06 NOTE — Telephone Encounter (Signed)
Patient identified by name and date of birth.  Patient aware of new letter.

## 2022-11-09 NOTE — Addendum Note (Signed)
Addended by: Joycelyn Schmid R on: 11/09/2022 04:36 PM   Modules accepted: Orders

## 2022-11-09 NOTE — Telephone Encounter (Signed)
Orders Placed This Encounter  Procedures   AMB referral to sports medicine   NCV with EMG(electromyography)    Suanne Marker, MD 11/09/2022, 4:36 PM Certified in Neurology, Neurophysiology and Neuroimaging  [facility] [address] [place], [place] [address] [phone]

## 2022-11-10 ENCOUNTER — Other Ambulatory Visit: Payer: Self-pay

## 2022-11-10 ENCOUNTER — Ambulatory Visit: Payer: BLUE CROSS/BLUE SHIELD | Attending: Nurse Practitioner | Admitting: Physical Therapy

## 2022-11-10 ENCOUNTER — Encounter: Payer: Self-pay | Admitting: Physical Therapy

## 2022-11-10 VITALS — BP 139/96 | HR 94

## 2022-11-10 DIAGNOSIS — R2689 Other abnormalities of gait and mobility: Secondary | ICD-10-CM | POA: Diagnosis present

## 2022-11-10 DIAGNOSIS — I69354 Hemiplegia and hemiparesis following cerebral infarction affecting left non-dominant side: Secondary | ICD-10-CM | POA: Insufficient documentation

## 2022-11-10 DIAGNOSIS — R2681 Unsteadiness on feet: Secondary | ICD-10-CM | POA: Insufficient documentation

## 2022-11-10 DIAGNOSIS — M6281 Muscle weakness (generalized): Secondary | ICD-10-CM | POA: Diagnosis present

## 2022-11-10 NOTE — Therapy (Signed)
OUTPATIENT PHYSICAL THERAPY NEURO EVALUATION   Patient Name: Connie Herrera MRN: [id number] DOB:Oct 09, 1975, 47 y.o., female Today's Date: 11/10/2022   PCP: Claiborne Rigg, NP REFERRING PROVIDER: Claiborne Rigg, NP  END OF SESSION:  11/10/22 1202  PT Visits / Re-Eval  Visit Number 1  Number of Visits 5 (4 + eval)  Date for PT Re-Evaluation 12/25/22 (pushed out due to scheduling delay)  Authorization  Authorization Type [employer] (Billing secondary as primary insurance is OON)  PT Time Calculation  PT Start Time 1155 (PT with patient prior)  PT Stop Time 1232  PT Time Calculation (min) 37 min  PT - End of Session  Equipment Utilized During Treatment Gait belt  Activity Tolerance Patient limited by pain;Other (comment) (pt tearful during assessment)  Behavior During Therapy [facility] for tasks assessed/performed    Past Medical History:  Diagnosis Date   Brain lesion    COVID-19 04/2020   CVA (cerebral vascular accident) (HCC) 04/2022   Diplopia    Hypertension    Iron deficiency anemia    Past Surgical History:  Procedure Laterality Date   AORTOGRAM N/A 04/14/2022   Procedure: DIAGNOSTIC AORTOGRAM;  Surgeon: Victorino Sparrow, MD;  Location: [facility] OR;  Service: Vascular;  Laterality: N/A;   BUBBLE STUDY  06/15/2022   Procedure: BUBBLE STUDY;  Surgeon: Maisie Fus, MD;  Location: [facility] ENDOSCOPY;  Service: Cardiovascular;;   ENDARTERECTOMY Right 04/14/2022   Procedure: CAROTID CUTDOWN WITH BRACHIOCEPHALIC STENTING, WITH ULTRASOUND OF THE RIGHT FEMORAL VEIN;  Surgeon: Victorino Sparrow, MD;  Location: [facility] OR;  Service: Vascular;  Laterality: Right;   TEE WITHOUT CARDIOVERSION N/A 06/15/2022   Procedure: TRANSESOPHAGEAL ECHOCARDIOGRAM (TEE);  Surgeon: Maisie Fus, MD;  Location: [facility] ENDOSCOPY;  Service: Cardiovascular;  Laterality: N/A;   TUBAL LIGATION     Patient Active Problem List   Diagnosis Date Noted   Acute CVA (cerebrovascular accident) (HCC) 04/11/2022    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   Left arm weakness 04/09/2022   Iron deficiency anemia 04/09/2022   Hypokalemia 04/09/2022   Muscle spasms of both lower extremities 08/14/2020   Fibroid uterus 08/14/2020   Anemia due to blood loss, chronic 01/10/2020   Menorrhagia with regular cycle 01/09/2020   Class 1 obesity due to excess calories with body mass index (BMI) of 34.0 to 34.9 in adult 01/09/2020   Screening breast examination 02/07/2019   Breast pain 02/07/2019   Hypertension 01/26/2019   Paresthesias 08/04/2013   Breast pain, right 03/11/2012    ONSET DATE: CVA on 04/09/2022  REFERRING DIAG: 09/22/2022  THERAPY DIAG:  Hemiplegia and hemiparesis following cerebral infarction affecting left non-dominant side (HCC)  Other abnormalities of gait and mobility  Muscle weakness (generalized)  Unsteadiness on feet  Rationale for Evaluation and Treatment: Rehabilitation  SUBJECTIVE:  SUBJECTIVE STATEMENT: Patient reports she is struggling with left arm weakness and shaking.  She reports she cannot pick up things with the left hand.  She states her vision intermittently gets blurry.  Her left leg has spasms.  She presents to clinic ambulating without AD or notable imbalance from lobby. 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 Yes: NPRS scale: 5/10 Pain location: left arm Pain description: aching Aggravating factors: laying on left arm, picking up heavy objects Relieving factors: nothing  PRECAUTIONS: Fall and Other: right brachiocephalic stent - right wrist cuff for BP  RED FLAGS: None   WEIGHT BEARING RESTRICTIONS:  No  FALLS: Has patient fallen in last 6 months? No  LIVING ENVIRONMENT: Lives with: lives alone Lives in: House/apartment Stairs: Yes: External: 6 steps; on right going up Has following equipment at home: None  PLOF: Independent  PATIENT GOALS: "To get my arm back stronger so I can go to work"  OBJECTIVE:   DIAGNOSTIC FINDINGS:  MRI Brain 04/09/2022 IMPRESSION: 1. Multiple small areas of diffusion hyperintense signal in the occipital parietal lobes bilaterally. Some of these lesions do mild enhancement. Appearance is nonspecific but could be seen with subacute infarct. Demyelinating disease a consideration but not typical. Also consider inflammatory process such as sarcoid, vasculitis, lupus and PRES. Recommend lumbar puncture for further evaluation. 2. 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 Enhancement is somewhat atypical however is stable. No other areas of hemorrhage. 3. Mild white matter changes in the cerebral hemispheres bilaterally. This may be due to chronic microvascular ischemia. Demyelinating disease not excluded. Correlate with risk factors for small vessel disease.  MRI Brain 09/07/2022 IMPRESSION: 1. No acute intracranial process. 2. 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  Pending nerve conduction study.  COGNITION: Overall cognitive status: Within functional limits for tasks assessed - pt reports cognitive fog and some difficulty with memory   SENSATION: Light touch: WFL  COORDINATION: LE RAMS:  WNL BLE Heel-to-shin:  WNL  EDEMA:  None noted in BLE  MUSCLE TONE: No hypertonicity in LLE, difficulty discerning mild clonus vs pt volitional response to quick DF  POSTURE: forward head  LOWER EXTREMITY ROM:     Active  Right Eval Left Eval  Hip flexion Grossly  WFL  Hip extension   Hip abduction   Hip adduction   Hip internal rotation   Hip external rotation   Knee flexion   Knee extension   Ankle dorsiflexion   Ankle plantarflexion    Ankle inversion    Ankle eversion     (Blank rows = not tested)  LOWER EXTREMITY MMT:    MMT Right Eval Left Eval  Hip flexion Grossly 4+/5  Hip extension   Hip abduction   Hip adduction   Hip internal rotation   Hip external rotation   Knee flexion   Knee extension   Ankle dorsiflexion   Ankle plantarflexion    Ankle inversion    Ankle eversion    (Blank rows = not tested)  BED MOBILITY:  Sit to supine Complete Independence Supine to sit Complete Independence Rolling to Right Complete Independence Rolling to Left Complete Independence  TRANSFERS: Assistive device utilized: None  Sit to stand: Complete Independence Stand to sit: Complete Independence Chair to chair: Complete Independence  GAIT: Gait pattern: step through pattern, decreased arm swing- Left, and narrow BOS Distance walked: various clinic distances Assistive device utilized: None Level of assistance: Complete Independence  FUNCTIONAL TESTS:  5 times sit to stand: 13.62 seconds no UE support 10 meter walk test: 9.32 seconds no AD ind = 1.07 m/sec OR 3.54 ft/sec Functional gait assessment: 21/30   [facility] PT Assessment - 11/10/22 1229       Functional Gait  Assessment   Gait assessed  Yes    Gait Level Surface Walks 20 ft in less than 5.5 sec, no assistive devices, good speed, no evidence for imbalance, normal gait pattern, deviates no more than 6 in outside of the 12 in walkway width.    Change in Gait Speed Able to change speed, demonstrates mild gait deviations, deviates 6-10 in outside of the 12 in walkway width, or no gait deviations, unable to achieve a major change in velocity, or uses a change in velocity, or uses an assistive device.   antalgic deficits   Gait with Horizontal Head Turns Performs head turns  smoothly with no change in gait. Deviates no more than 6 in outside 12 in walkway width    Gait with Vertical Head Turns Performs head turns with no change in gait. Deviates no more than 6 in outside 12 in walkway width.    Gait and Pivot Turn Pivot turns safely in greater than 3 sec and stops with no loss of balance, or pivot turns safely within 3 sec and stops with mild imbalance, requires small steps to catch balance.    Step Over Obstacle Is able to step over one shoe box (4.5 in total height) without changing gait speed. No evidence of imbalance.    Gait with Narrow Base of Support Ambulates 4-7 steps.   5 steps, pt becomes tearful   Gait with Eyes Closed Walks 20 ft, uses assistive device, slower speed, mild gait deviations, deviates 6-10 in outside 12 in walkway width. Ambulates 20 ft in less than 9 sec but greater than 7 sec.    Ambulating Backwards Walks 20 ft, uses assistive device, slower speed, mild gait deviations, deviates 6-10 in outside 12 in walkway width.    Steps Two feet to a stair, must use rail.    Total Score 21    FGA comment: 21/30 = moderate fall risk            PATIENT SURVEYS:  FOTO Not captured at intake.  TODAY'S TREATMENT:                                                                                                                              DATE: N/A - eval only.   PATIENT EDUCATION: Education details: PT POC, assessments used and to be used, and goals to be set. Person educated: Patient Education method: Explanation Education comprehension:  verbalized understanding  HOME EXERCISE PROGRAM: To be established.  GOALS: Goals reviewed with patient? Yes  SHORT TERM GOALS = LONG TERM GOALS: Target date: 12/11/2022  Pt will be independent and compliant with strengthening and balance HEP in order to maintain functional progress and improve mobility. Baseline:  To be established. Goal status: INITIAL  2.  Pt will decrease 5xSTS to </=12 seconds in  order to demonstrate decreased risk for falls and improved functional bilateral LE strength and power. Baseline: 13.62 seconds no UE support Goal status: INITIAL  3.  Pt will demonstrate a gait speed of >/=3.74 feet/sec in order to decrease risk for falls. Baseline: 3.54 ft/sec Goal status: INITIAL  4.  Pt will improve FGA score to >/=25/30 in order to demonstrate improved balance and decreased fall risk. Baseline: 21/30 Goal status: INITIAL  ASSESSMENT:  CLINICAL IMPRESSION: Patient is a 47 y.o. female who was seen today for physical therapy evaluation and treatment for left hemibody deficits following CVA.  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  Identified impairments include left arm pain, cognitive fog, mild BLE weakness, and arrowed BOS with gait.  Evaluation via the following assessment tools: 5xSTS, , and FGA indicate mild to moderate fall risk.  She would benefit from skilled PT to address impairments as noted and progress towards long term goals.  OBJECTIVE IMPAIRMENTS: decreased activity tolerance, decreased balance, decreased knowledge of condition, decreased strength, improper body mechanics, and pain.   ACTIVITY LIMITATIONS: carrying, lifting, squatting, stairs, reach over head, locomotion level, and caring for others  PARTICIPATION LIMITATIONS: meal prep, cleaning, laundry, community activity, and occupation  PERSONAL FACTORS: Fitness, Past/current experiences, Time since onset of injury/illness/exacerbation, and 1-2 comorbidities: HTN, palpitations  are also affecting patient's functional outcome.   REHAB POTENTIAL: Excellent  CLINICAL DECISION MAKING: Stable/uncomplicated  EVALUATION COMPLEXITY: Low  PLAN:  PT FREQUENCY: 1x/week  PT DURATION: 4 weeks  PLANNED INTERVENTIONS: Therapeutic exercises, Therapeutic activity, Neuromuscular  re-education, Balance training, Gait training, Patient/Family education, Self Care, Stair training, Vestibular training, Manual therapy, and Re-evaluation  PLAN FOR NEXT SESSION: Initial HEP for LE strength and balance.  Endurance.   Sadie Haber, PT, DPT 11/10/2022, 12:32 PM

## 2022-11-11 ENCOUNTER — Ambulatory Visit (INDEPENDENT_AMBULATORY_CARE_PROVIDER_SITE_OTHER): Payer: BLUE CROSS/BLUE SHIELD | Admitting: Vascular Surgery

## 2022-11-11 ENCOUNTER — Ambulatory Visit: Payer: BLUE CROSS/BLUE SHIELD | Admitting: Family Medicine

## 2022-11-11 ENCOUNTER — Encounter: Payer: Self-pay | Admitting: Vascular Surgery

## 2022-11-11 ENCOUNTER — Ambulatory Visit (HOSPITAL_COMMUNITY)
Admission: RE | Admit: 2022-11-11 | Discharge: 2022-11-11 | Disposition: A | Discharge status: Home / Self Care | Payer: BLUE CROSS/BLUE SHIELD | Source: Ambulatory Visit | Attending: Vascular Surgery | Admitting: Vascular Surgery

## 2022-11-11 VITALS — BP 150/97 | HR 74 | Temp 98.2°F | Resp 20 | Ht 67.0 in | Wt 234.0 lb

## 2022-11-11 DIAGNOSIS — Z959 Presence of cardiac and vascular implant and graft, unspecified: Secondary | ICD-10-CM | POA: Diagnosis not present

## 2022-11-11 DIAGNOSIS — I749 Embolism and thrombosis of unspecified artery: Secondary | ICD-10-CM | POA: Diagnosis not present

## 2022-11-11 NOTE — Progress Notes (Deleted)
   Rubin Payor, PhD, LAT, ATC acting as a scribe for Clementeen Graham, MD.  Connie Herrera is a 47 y.o. female who presents to [facility] today for L arm and bilateral leg pain. Pt suffered a stroke in Jan 2024 and has been seen previously by neurology. Pt locates pain to ***  Radiates:  Numbness/tingling: Weakness: Aggravates: Treatments tried:  Pertinent review of systems: ***  Relevant historical information: ***   Exam:  There were no vitals taken for this visit. General: Well Developed, well nourished, and in no acute distress.   MSK: ***    Lab and Radiology Results No results found for this or any previous visit (from the past 72 hour(s)). No results found.     Assessment and Plan: 47 y.o. female with ***   PDMP not reviewed this encounter. No orders of the defined types were placed in this encounter.  No orders of the defined types were placed in this encounter.    Discussed warning signs or symptoms. Please see discharge instructions. Patient expresses understanding.   ***

## 2022-11-11 NOTE — Progress Notes (Signed)
Office Note    HPI: Connie Herrera is a 47 y.o. (1976-01-04) female presenting s/p right carotid cutdown innominate artery stenting for acute thrombus.  On exam today, continue was doing well.  She had no complaints.  She denied TIA, stroke, amaurosis.  Continues to have some left arm nerve dysfunction, which is not new.  States that she has some numbness at the incision site on her right neck.   She denies right upper extremity weakness, sensorimotor deficits.  The pt is not on a statin for cholesterol management.  The pt is  on a daily aspirin.   Other AC:  none The pt is  on medication for hypertension.   The pt is not diabetic.  Tobacco hx:  -  Past Medical History:  Diagnosis Date   Brain lesion    COVID-19 04/2020   CVA (cerebral vascular accident) (HCC) 04/2022   Diplopia    Hypertension    Iron deficiency anemia     Past Surgical History:  Procedure Laterality Date   AORTOGRAM N/A 04/14/2022   Procedure: DIAGNOSTIC AORTOGRAM;  Surgeon: Victorino Sparrow, MD;  Location: [facility] OR;  Service: Vascular;  Laterality: N/A;   BUBBLE STUDY  06/15/2022   Procedure: BUBBLE STUDY;  Surgeon: Maisie Fus, MD;  Location: [facility] ENDOSCOPY;  Service: Cardiovascular;;   ENDARTERECTOMY Right 04/14/2022   Procedure: CAROTID CUTDOWN WITH BRACHIOCEPHALIC STENTING, WITH ULTRASOUND OF THE RIGHT FEMORAL VEIN;  Surgeon: Victorino Sparrow, MD;  Location: [facility] OR;  Service: Vascular;  Laterality: Right;   TEE WITHOUT CARDIOVERSION N/A 06/15/2022   Procedure: TRANSESOPHAGEAL ECHOCARDIOGRAM (TEE);  Surgeon: Maisie Fus, MD;  Location: [facility] ENDOSCOPY;  Service: Cardiovascular;  Laterality: N/A;   TUBAL LIGATION      Social History   Socioeconomic History   Marital status: Single    Spouse name: Not on file   Number of children: 4   Years of education: 14   Highest education level: Some college, no degree  Occupational History   Occupation: Dietary Supervisor    Comment: [employer]  Tobacco Use    Smoking status: Former    Current packs/day: 0.00    Average packs/day: 0.5 packs/day for 4.0 years (2.0 ttl pk-yrs)    Types: Cigarettes    Start date: 06/04/2013    Quit date: 06/04/2017    Years since quitting: 5.4    Passive exposure: Past   Smokeless tobacco: Never  Vaping Use   Vaping status: Never Used  Substance and Sexual Activity   Alcohol use: No   Drug use: Not Currently    Types: Marijuana    Comment: 09/30/20 maybe once a week   Sexual activity: Yes    Birth control/protection: Surgical  Other Topics Concern   Not on file  Social History Narrative   Patient lives at home with children.    Patient has 4 children.    Patient is single.    Patient has 2 years of college.    Patient is right handed.    Patient is working full time. [employer].    Social Determinants of Health   Financial Resource Strain: Not on file  Food Insecurity: No Food Insecurity (04/10/2022)   Hunger Vital Sign    Worried About Running Out of Food in the Last Year: Never true    Ran Out of Food in the Last Year: Never true  Transportation Needs: No Transportation Needs (04/10/2022)   PRAPARE - Transportation    Lack  of Transportation (Medical): No    Lack of Transportation (Non-Medical): No  Physical Activity: Not on file  Stress: Not on file  Social Connections: Not on file  Intimate Partner Violence: Not At Risk (04/10/2022)   Humiliation, Afraid, Rape, and Kick questionnaire    Fear of Current or Ex-Partner: No    Emotionally Abused: No    Physically Abused: No    Sexually Abused: No   Family History  Problem Relation Age of Onset   Healthy Mother    Cirrhosis Father    Cancer Maternal Grandmother 21       breast   Breast cancer Maternal Grandmother     Current Outpatient Medications  Medication Sig Dispense Refill   amLODipine (NORVASC) 10 MG tablet Take 1 tablet (10 mg total) by mouth daily. 90 tablet 3   aspirin EC 81 MG tablet Take 1 tablet (81 mg total) by mouth  daily. Take daily at 6 am. Swallow whole. 90 tablet 3   cyclobenzaprine (FLEXERIL) 10 MG tablet Take 10 mg by mouth at bedtime.     ferrous sulfate 325 (65 FE) MG EC tablet Take 1 tablet (325 mg total) by mouth 2 (two) times daily. (Patient taking differently: Take 1 tablet by mouth daily with breakfast.) 60 tablet 3   ibuprofen (ADVIL) 800 MG tablet Take 800 mg by mouth every 8 (eight) hours as needed.     losartan (COZAAR) 25 MG tablet Take 1 tablet (25 mg total) by mouth daily. 90 tablet 3   naloxone (NARCAN) nasal spray 4 mg/0.1 mL Place 1 spray into the nose as needed.     oxyCODONE-acetaminophen (PERCOCET) 10-325 MG tablet Take 1 tablet by mouth every 8 (eight) hours as needed for pain.     pantoprazole (PROTONIX) 40 MG tablet TAKE 1 TABLET BY MOUTH EVERY DAY 90 tablet 2   rosuvastatin (CRESTOR) 10 MG tablet Take 1 tablet (10 mg total) by mouth daily. 90 tablet 3   triamcinolone ointment (KENALOG) 0.1 % Apply 1 Application topically 2 (two) times daily. 30 g 0   Vitamin D, Ergocalciferol, (DRISDOL) 1.25 MG (50000 UNIT) CAPS capsule Take 50,000 Units by mouth every Thursday.     No current facility-administered medications for this visit.    No Known Allergies   REVIEW OF SYSTEMS:  [X]  denotes positive finding, [ ]  denotes negative finding Cardiac  Comments:  Chest pain or chest pressure:    Shortness of breath upon exertion:    Short of breath when lying flat:    Irregular heart rhythm:        Vascular    Pain in calf, thigh, or hip brought on by ambulation:    Pain in feet at night that wakes you up from your sleep:     Blood clot in your veins:    Leg swelling:         Pulmonary    Oxygen at home:    Productive cough:     Wheezing:         Neurologic    Sudden weakness in arms or legs:     Sudden numbness in arms or legs:     Sudden onset of difficulty speaking or slurred speech:    Temporary loss of vision in one eye:     Problems with dizziness:          Gastrointestinal    Blood in stool:     Vomited blood:  Genitourinary    Burning when urinating:     Blood in urine:        Psychiatric    Major depression:         Hematologic    Bleeding problems:    Problems with blood clotting too easily:        Skin    Rashes or ulcers:        Constitutional    Fever or chills:      PHYSICAL EXAMINATION:  Vitals:   11/11/22 1143  BP: 138/88  Pulse: 74  Resp: 20  Temp: 98.2 F (36.8 C)  SpO2: 98%  Weight: 234 lb (106.1 kg)  Height: 5\' 7"  (1.702 m)    General:  WDWN in NAD; vital signs documented above Gait: Not observed HENT: WNL, normocephalic Pulmonary: normal non-labored breathing , without wheezing Cardiac: regular HR Abdomen: soft, NT, no masses Skin: without rashes Vascular Exam/Pulses:  Right Left  Radial 2+ (normal) 2+ (normal)  Ulnar                     Extremities: without ischemic changes, without Gangrene , without cellulitis; without open wounds;  Musculoskeletal: no muscle wasting or atrophy  Neurologic: A&O X 3;  No focal weakness or paresthesias are detected Psychiatric:  The pt has Normal affect.   Non-Invasive Vascular Imaging:    IMPRESSION: Patent right innominate arterial stent. No residual thrombus is noted    ASSESSMENT/PLAN: Connie Herrera is a 47 y.o. female presenting status post right carotid cutdown, retrograde innominate stenting using a VBX stent.  On exam,Connie Herrera was doing well, asymptomatic with no new history of TIA, stroke, amaurosis. Blood pressure higher in the right upper extremity than left. No signs of stenosis on bilateral carotid duplex ultrasound.  Off anticoagulation per hematology. Asked to continue aspirin lifelong.  I asked that she call me should any questions or concerns arise. Plan for 1 year follow-up with carotid duplex ultrasound.   Victorino Sparrow, MD [facility] [phone]

## 2022-11-13 ENCOUNTER — Other Ambulatory Visit: Payer: Self-pay | Admitting: Nurse Practitioner

## 2022-11-13 ENCOUNTER — Telehealth: Payer: Self-pay | Admitting: Physical Therapy

## 2022-11-13 DIAGNOSIS — I639 Cerebral infarction, unspecified: Secondary | ICD-10-CM

## 2022-11-13 NOTE — Telephone Encounter (Signed)
Connie Herrera was evaluated by PT on 11/10/2022.  The patient would benefit from OT and ST evaluations for fine motor deficits in LUE and memory deficits and cognitive fog.   If you agree, please place an order in [facility] workque in [facility] or fax the order to [phone]. Thank you, Camille Bal, PT, DPT  [facility] [address] [place], [place]  [address] Phone:  [phone] Fax:  [phone]

## 2022-11-17 ENCOUNTER — Ambulatory Visit: Payer: BLUE CROSS/BLUE SHIELD | Admitting: Physical Therapy

## 2022-11-17 NOTE — Progress Notes (Deleted)
    Connie Herrera D.[facility] [address] [place] [address] Phone: [phone]   Assessment and Plan:     There are no diagnoses linked to this encounter.  ***   Pertinent previous records reviewed include ***   Follow Up: ***     Subjective:    Chief Complaint: ***  HPI:   11/17/22 ***  Relevant Historical Information: ***  Additional pertinent review of systems negative.   Current Outpatient Medications:    amLODipine (NORVASC) 10 MG tablet, Take 1 tablet (10 mg total) by mouth daily., Disp: 90 tablet, Rfl: 3   aspirin EC 81 MG tablet, Take 1 tablet (81 mg total) by mouth daily. Take daily at 6 am. Swallow whole., Disp: 90 tablet, Rfl: 3   cyclobenzaprine (FLEXERIL) 10 MG tablet, Take 10 mg by mouth at bedtime., Disp: , Rfl:    ferrous sulfate 325 (65 FE) MG EC tablet, Take 1 tablet (325 mg total) by mouth 2 (two) times daily. (Patient taking differently: Take 1 tablet by mouth daily with breakfast.), Disp: 60 tablet, Rfl: 3   ibuprofen (ADVIL) 800 MG tablet, Take 800 mg by mouth every 8 (eight) hours as needed., Disp: , Rfl:    losartan (COZAAR) 25 MG tablet, Take 1 tablet (25 mg total) by mouth daily., Disp: 90 tablet, Rfl: 3   naloxone (NARCAN) nasal spray 4 mg/0.1 mL, Place 1 spray into the nose as needed., Disp: , Rfl:    oxyCODONE-acetaminophen (PERCOCET) 10-325 MG tablet, Take 1 tablet by mouth every 8 (eight) hours as needed for pain., Disp: , Rfl:    pantoprazole (PROTONIX) 40 MG tablet, TAKE 1 TABLET BY MOUTH EVERY DAY, Disp: 90 tablet, Rfl: 2   rosuvastatin (CRESTOR) 10 MG tablet, Take 1 tablet (10 mg total) by mouth daily., Disp: 90 tablet, Rfl: 3   triamcinolone ointment (KENALOG) 0.1 %, Apply 1 Application topically 2 (two) times daily., Disp: 30 g, Rfl: 0   Vitamin D, Ergocalciferol, (DRISDOL) 1.25 MG (50000 UNIT) CAPS capsule, Take 50,000 Units by mouth every Thursday., Disp: , Rfl:    Objective:     There were  no vitals filed for this visit.    There is no height or weight on file to calculate BMI.    Physical Exam:    ***   Electronically signed by:  Connie Herrera D.[facility] 4:24 PM 11/17/22

## 2022-11-18 ENCOUNTER — Other Ambulatory Visit: Payer: Self-pay

## 2022-11-18 ENCOUNTER — Ambulatory Visit: Payer: BLUE CROSS/BLUE SHIELD | Admitting: Sports Medicine

## 2022-11-18 DIAGNOSIS — R6889 Other general symptoms and signs: Secondary | ICD-10-CM

## 2022-11-20 ENCOUNTER — Encounter (HOSPITAL_COMMUNITY): Payer: Medicaid Other

## 2022-11-20 ENCOUNTER — Ambulatory Visit: Payer: Medicaid Other | Admitting: Vascular Surgery

## 2022-11-20 NOTE — Progress Notes (Signed)
Connie Herrera D.[facility] [address] [place] [address] Phone: [phone]   Assessment and Plan:     1. Left arm weakness 2. Left arm pain 3. 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  Can titrate up to gabapentin 400 mg nightly trying to find lowest effective dose  4. Leg cramps  -Chronic with exacerbation, initial sports medicine visit - Leg cramping ongoing for the past 2 years that typically occur at night when patient is lying down.  These were present before CVA - Most consistent with restless leg syndrome due to timing, and unremarkable lumbar imaging in 2022 - May start gabapentin 100 mg nightly.  Can titrate up to gabapentin 400 mg nightly trying to find lowest effective dose -Start physical therapy for back and lower extremities  Pertinent previous records reviewed include vascular surgery note 11/11/2022, neurology note 09/29/22, MRI lumbar spine 10/03/2020   Follow Up: 3 weeks for reevaluation.  If no improvement or worsening of symptoms, we could continue to titrate up gabapentin   Subjective:   I, Connie Herrera, am serving as a Neurosurgeon for Doctor Richardean Sale  Chief Complaint: left arm pain   HPI:  11/27/2022 Patient is a 47 year old female complaining of left arm pain. Patient states worsening neck pain x 3 months. Pain radiating into the left arm. Pt is RHD. Notes n/t and weakness in the left arm, related to stroke. Also notes HA associated with neck pain. Minimal relief with analgesics, more relief with muscle relaxer's.   Leg pain x 2 years, spasms. Heat makes the leg pain worse. Has been treated in the ED for this in the past. Having pain in both calves, feel  tight and swollen, random bruising present at times. Unaware of increased warmth or erythema in the legs. Pain comes and goes, worse when legs are elevated, improves when walking. May not get adequate water intake but good dietary potassium intake.   Relevant Historical Information: CVA in January 2024 with residual left-sided weakness, hypertension  Additional pertinent review of systems negative.   Current Outpatient Medications:    amLODipine (NORVASC) 10 MG tablet, Take 1 tablet (10 mg total) by mouth daily., Disp: 90 tablet, Rfl: 3   aspirin EC 81 MG tablet, Take 1 tablet (81 mg total) by mouth daily. Take daily at 6 am. Swallow whole., Disp: 90 tablet, Rfl: 3   cyclobenzaprine (FLEXERIL) 10 MG tablet, Take 10 mg by mouth at bedtime., Disp: , Rfl:    ferrous sulfate 325 (65 FE) MG EC tablet, Take 1 tablet (325 mg total) by mouth 2 (two) times daily. (Patient taking differently: Take 1 tablet by mouth daily with breakfast.), Disp: 60 tablet, Rfl: 3   gabapentin (NEURONTIN) 100 MG capsule, Take 1-4 capsules (100-400 mg total) by mouth at bedtime as needed., Disp: 50 capsule, Rfl: 0   ibuprofen (ADVIL) 800 MG tablet, Take 800 mg by mouth every 8 (eight) hours as needed., Disp: , Rfl:    losartan (COZAAR) 25 MG tablet, Take 1 tablet (25 mg total) by mouth daily., Disp: 90 tablet, Rfl: 3   naloxone (NARCAN) nasal spray 4 mg/0.1 mL, Place 1 spray into the nose as needed., Disp: , Rfl:    oxyCODONE-acetaminophen (PERCOCET) 10-325 MG tablet,  Take 1 tablet by mouth every 8 (eight) hours as needed for pain., Disp: , Rfl:    pantoprazole (PROTONIX) 40 MG tablet, TAKE 1 TABLET BY MOUTH EVERY DAY, Disp: 90 tablet, Rfl: 2   rosuvastatin (CRESTOR) 10 MG tablet, Take 1 tablet (10 mg total) by mouth daily., Disp: 90 tablet, Rfl: 3   triamcinolone ointment (KENALOG) 0.1 %, Apply 1 Application topically 2 (two) times daily., Disp: 30 g, Rfl: 0   Vitamin D, Ergocalciferol, (DRISDOL) 1.25 MG (50000 UNIT) CAPS  capsule, Take 50,000 Units by mouth every Thursday., Disp: , Rfl:    Objective:     Vitals:   11/27/22 1005  BP: 130/86  Pulse: 84  SpO2: 100%  Weight: 234 lb (106.1 kg)  Height: 5\' 7"  (1.702 m)      Body mass index is 36.65 kg/m.    Physical Exam:    Gen: Appears well, nad, nontoxic and pleasant Psych: Alert and oriented, appropriate mood and affect Neuro: sensation intact, strength is 5/5 in   lower extremities, muscle tone wnl.  Strength 4/5 in left upper extremity compared to 5/5 in right upper extremity Skin: no susupicious lesions or rashes  Back - Normal skin, Spine with normal alignment and no deformity.   No tenderness to vertebral process palpation.   Paraspinous muscles are not tender and without spasm NTTP gluteal musculature Straight leg raise negative Trendelenberg negative Piriformis Test negative Gait normal    Electronically signed by:  Connie Herrera D.[facility] 10:34 AM 11/27/22

## 2022-11-27 ENCOUNTER — Ambulatory Visit: Payer: BLUE CROSS/BLUE SHIELD | Admitting: Physical Therapy

## 2022-11-27 ENCOUNTER — Ambulatory Visit (INDEPENDENT_AMBULATORY_CARE_PROVIDER_SITE_OTHER): Payer: BLUE CROSS/BLUE SHIELD | Admitting: Sports Medicine

## 2022-11-27 ENCOUNTER — Encounter: Payer: Self-pay | Admitting: Sports Medicine

## 2022-11-27 ENCOUNTER — Encounter: Payer: Self-pay | Admitting: Physical Therapy

## 2022-11-27 VITALS — BP 130/86 | HR 84 | Ht 67.0 in | Wt 234.0 lb

## 2022-11-27 VITALS — BP 135/85

## 2022-11-27 DIAGNOSIS — Z8673 Personal history of transient ischemic attack (TIA), and cerebral infarction without residual deficits: Secondary | ICD-10-CM | POA: Diagnosis not present

## 2022-11-27 DIAGNOSIS — M79602 Pain in left arm: Secondary | ICD-10-CM

## 2022-11-27 DIAGNOSIS — R2689 Other abnormalities of gait and mobility: Secondary | ICD-10-CM

## 2022-11-27 DIAGNOSIS — I69354 Hemiplegia and hemiparesis following cerebral infarction affecting left non-dominant side: Secondary | ICD-10-CM | POA: Diagnosis not present

## 2022-11-27 DIAGNOSIS — R29898 Other symptoms and signs involving the musculoskeletal system: Secondary | ICD-10-CM | POA: Diagnosis not present

## 2022-11-27 DIAGNOSIS — R2681 Unsteadiness on feet: Secondary | ICD-10-CM

## 2022-11-27 DIAGNOSIS — M6281 Muscle weakness (generalized): Secondary | ICD-10-CM

## 2022-11-27 DIAGNOSIS — R252 Cramp and spasm: Secondary | ICD-10-CM | POA: Diagnosis not present

## 2022-11-27 MED ORDER — GABAPENTIN 100 MG PO CAPS
100.0000 mg | ORAL_CAPSULE | Freq: Every evening | ORAL | 0 refills | Status: AC | PRN
Start: 2022-11-27 — End: ?

## 2022-11-27 NOTE — Therapy (Signed)
OUTPATIENT PHYSICAL THERAPY NEURO TREATMENT   Patient Name: Connie Herrera MRN: [id number] DOB:[birth year], 47 y.o., female Today's Date: 11/27/2022   PCP: Claiborne Rigg, NP REFERRING PROVIDER: Claiborne Rigg, NP  END OF SESSION:  PT End of Session - 11/27/22 1244     Visit Number 2    Number of Visits 5   4 + eval   Date for PT Re-Evaluation 12/25/22   pushed out due to scheduling delay   Authorization Type [employer]   Billing secondary as primary insurance is OON   PT Start Time 1233    PT Stop Time 1312    PT Time Calculation (min) 39 min    Equipment Utilized During Treatment Gait belt    Activity Tolerance Patient limited by pain    Behavior During Therapy [facility] for tasks assessed/performed;Agitated            Past Medical History:  Diagnosis Date   Brain lesion    COVID-19 04/2020   CVA (cerebral vascular accident) (HCC) 04/2022   Diplopia    Hypertension    Iron deficiency anemia    Past Surgical History:  Procedure Laterality Date   AORTOGRAM N/A 04/14/2022   Procedure: DIAGNOSTIC AORTOGRAM;  Surgeon: Victorino Sparrow, MD;  Location[facility] OR;  Service: Vascular;  Laterality: N/A;   BUBBLE STUDY  06/15/2022   Procedure: BUBBLE STUDY;  Surgeon: Maisie Fus, MD;  Location: [facility] ENDOSCOPY;  Service: Cardiovascular;;   ENDARTERECTOMY Right 04/14/2022   Procedure: CAROTID CUTDOWN WITH BRACHIOCEPHALIC STENTING, WITH ULTRASOUND OF THE RIGHT FEMORAL VEIN;  Surgeon: Victorino Sparrow, MD;  Location: [facility] OR;  Service: Vascular;  Laterality: Right;   TEE WITHOUT CARDIOVERSION N/A 06/15/2022   Procedure: TRANSESOPHAGEAL ECHOCARDIOGRAM (TEE);  Surgeon: Maisie Fus, MD;  Location: [facility] ENDOSCOPY;  Service: Cardiovascular;  Laterality: N/A;   TUBAL LIGATION     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   Left arm weakness 04/09/2022   Iron deficiency anemia  04/09/2022   Hypokalemia 04/09/2022   Muscle spasms of both lower extremities 08/14/2020   Fibroid uterus 08/14/2020   Anemia due to blood loss, chronic 01/10/2020   Menorrhagia with regular cycle 01/09/2020   Class 1 obesity due to excess calories with body mass index (BMI) of 34.0 to 34.9 in adult 01/09/2020   Screening breast examination 02/07/2019   Breast pain 02/07/2019   Hypertension 01/26/2019   Paresthesias 08/04/2013   Breast pain, right 03/11/2012    ONSET DATE: CVA on 04/09/2022  REFERRING DIAG: 09/22/2022  THERAPY DIAG:  Other abnormalities of gait and mobility  Muscle weakness (generalized)  Unsteadiness on feet  Hemiplegia and hemiparesis following cerebral infarction affecting left non-dominant side (HCC)  Rationale for Evaluation and Treatment: Rehabilitation  SUBJECTIVE:  SUBJECTIVE STATEMENT: Patient reports she was given gabapentin today and told she has restless leg syndrome and needs more therapy for this as this is why she is weak.  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  She denies falls or near falls or other acute changes. 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 No  PRECAUTIONS: Fall and Other: right brachiocephalic stent - right wrist cuff for BP  RED FLAGS: None   WEIGHT BEARING RESTRICTIONS: No  FALLS: Has patient fallen in last 6 months? No  LIVING ENVIRONMENT: Lives with: lives  alone Lives in: House/apartment Stairs: Yes: External: 6 steps; on right going up Has following equipment at home: None  PLOF: Independent  PATIENT GOALS: "To get my arm back stronger so I can go to work"  OBJECTIVE:   DIAGNOSTIC FINDINGS:  MRI Brain 04/09/2022 IMPRESSION: 1. Multiple small areas of diffusion hyperintense signal in the occipital parietal lobes bilaterally. Some of these lesions do mild enhancement. Appearance is nonspecific but could be seen with subacute infarct. Demyelinating disease a consideration but not typical. Also consider inflammatory process such as sarcoid, vasculitis, lupus and PRES. Recommend lumbar puncture for further evaluation. 2. 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 Enhancement is somewhat atypical however is stable. No other areas of hemorrhage. 3. Mild white matter changes in the cerebral hemispheres bilaterally. This may be due to chronic microvascular ischemia. Demyelinating disease not excluded. Correlate with risk factors for small vessel disease.  MRI Brain 09/07/2022 IMPRESSION: 1. No acute intracranial process. 2. 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  Pending nerve conduction study.  COGNITION: Overall cognitive status: Within functional limits for tasks assessed - pt reports cognitive fog and some difficulty with memory   SENSATION: Light touch: WFL  COORDINATION: LE RAMS:  WNL BLE Heel-to-shin:  WNL  EDEMA:  None noted in BLE  MUSCLE TONE: No hypertonicity in LLE, difficulty discerning mild clonus vs pt volitional response to quick DF  POSTURE: forward head  LOWER EXTREMITY ROM:     Active  Right Eval Left Eval  Hip flexion Grossly WFL  Hip extension   Hip abduction   Hip adduction   Hip internal rotation   Hip external  rotation   Knee flexion   Knee extension   Ankle dorsiflexion   Ankle plantarflexion    Ankle inversion    Ankle eversion     (Blank rows = not tested)  LOWER EXTREMITY MMT:    MMT Right Eval Left Eval  Hip flexion Grossly 4+/5  Hip extension   Hip abduction   Hip adduction   Hip internal rotation   Hip external rotation   Knee flexion   Knee extension   Ankle dorsiflexion   Ankle plantarflexion    Ankle inversion    Ankle eversion    (Blank rows = not tested)  BED MOBILITY:  Sit to supine Complete Independence Supine to sit Complete Independence Rolling to Right Complete  Independence Rolling to Left Complete Independence  TRANSFERS: Assistive device utilized: None  Sit to stand: Complete Independence Stand to sit: Complete Independence Chair to chair: Complete Independence  GAIT: Gait pattern: step through pattern, decreased arm swing- Left, and narrow BOS Distance walked: various clinic distances Assistive device utilized: None Level of assistance: Complete Independence  FUNCTIONAL TESTS:  5 times sit to stand: 13.62 seconds no UE support 10 meter walk test: 9.32 seconds no AD ind = 1.07 m/sec OR 3.54 ft/sec Functional gait assessment: 21/30    PATIENT SURVEYS:  FOTO Not captured at intake.  TODAY'S TREATMENT:                                                                                                                              DATE: 11/27/2022  -SciFit using BUE/BLE x8 minutes for reciprocal mobility, LE strengthening, and cardiac endurance.  Cued to work at a 6/10 workload.  Patient can only tolerate progression to level 2.0 -STS 2x10 no UE support -Standing march w/ red band x20 -Lateral stepping w/ red band at thighs 4x10' -Forward and backward monster walks 6x10' -Tandem walking forward and backwards 4x10'  PATIENT EDUCATION: Education details: Initial HEP.  Reminders of upcoming ST/OT referrals.  Was very honest with patient about needing  to exercise consistently regardless of pain in order to make progress as she has been inactive reportedly for 2 years prior to stroke.   Person educated: Patient Education method: Explanation Education comprehension: verbalized understanding  HOME EXERCISE PROGRAM: Access Code: [email or web address] URL: [email or web address] Date: 11/27/2022 Prepared by: Camille Bal  Exercises - Sit to Stand with Arms Crossed  - 1 x daily - 5 x weekly - 3 sets - 10 reps - Marching with Resistance  - 1 x daily - 5 x weekly - 1-2 sets - 20 reps - Forward Backward Monster Walk with Band at Thighs and Counter Support  - 1 x daily - 5 x weekly - 3 sets - 10 reps - Tandem Walking with Counter Support  - 1 x daily - 5 x weekly - 3 sets - 10 reps - Backward Tandem Walking with Counter Support  - 1 x daily - 5 x weekly - 3 sets - 10 reps - Side Stepping with Resistance at Thighs and Counter Support  - 1 x daily - 5 x weekly - 3 sets - 10 reps  GOALS: Goals reviewed with patient? Yes  SHORT TERM GOALS = LONG TERM GOALS: Target date: 12/11/2022  Pt will be independent and compliant with strengthening and balance HEP in order to maintain functional progress and improve mobility. Baseline:  To be established. Goal status: INITIAL  2.  Pt will decrease 5xSTS to </=12 seconds in order to demonstrate decreased risk for falls and improved functional bilateral LE strength and power. Baseline: 13.62 seconds no UE support Goal status: INITIAL  3.  Pt will demonstrate a gait speed of >/=3.74 feet/sec in order to decrease  risk for falls. Baseline: 3.54 ft/sec Goal status: INITIAL  4.  Pt will improve FGA score to >/=25/30 in order to demonstrate improved balance and decreased fall risk. Baseline: 21/30 Goal status: INITIAL  ASSESSMENT:  CLINICAL IMPRESSION: Patient is doing very well from a functional standpoint this visit.  She fatigues quickly, but exhibits little to no imbalance with tasks.  She  displays and reports fear avoidance behaviors regarding exercise related to possible health anxiety which has caused her to remain inactive in her daily life.  PT encouraged her to change this with initiation of high level strength and balance HEP.  Will plan to increase challenge next session in line with POC.  OBJECTIVE IMPAIRMENTS: decreased activity tolerance, decreased balance, decreased knowledge of condition, decreased strength, improper body mechanics, and pain.   ACTIVITY LIMITATIONS: carrying, lifting, squatting, stairs, reach over head, locomotion level, and caring for others  PARTICIPATION LIMITATIONS: meal prep, cleaning, laundry, community activity, and occupation  PERSONAL FACTORS: Fitness, Past/current experiences, Time since onset of injury/illness/exacerbation, and 1-2 comorbidities: HTN, palpitations  are also affecting patient's functional outcome.   REHAB POTENTIAL: Excellent  CLINICAL DECISION MAKING: Stable/uncomplicated  EVALUATION COMPLEXITY: Low  PLAN:  PT FREQUENCY: 1x/week  PT DURATION: 4 weeks  PLANNED INTERVENTIONS: Therapeutic exercises, Therapeutic activity, Neuromuscular re-education, Balance training, Gait training, Patient/Family education, Self Care, Stair training, Vestibular training, Manual therapy, and Re-evaluation  PLAN FOR NEXT SESSION: Add to HEP prn for LE strength and balance.  Endurance-treadmill increments?  Walking program?  Sadie Haber, PT, DPT 11/27/2022, 1:12 PM

## 2022-11-27 NOTE — Patient Instructions (Addendum)
A prescription for Gabapentin has been sent in to your pharmacy. Please take as directed below: Start gabapentin 100 mg nightly.  If this dose is not effective, increase to gabapentin 200 mg nightly.  If this dose is not effective, increase to gabapentin 300 mg nightly.  If this dose not effective, increase to gabapentin 400 mg nightly.  A referral has been placed to add your lower back and legs for Physical Therapy.

## 2022-11-27 NOTE — Patient Instructions (Signed)
Access Code: [email or web address] URL: [email or web address] Date: 11/27/2022 Prepared by: Camille Bal  Exercises - Sit to Stand with Arms Crossed  - 1 x daily - 5 x weekly - 3 sets - 10 reps - Marching with Resistance  - 1 x daily - 5 x weekly - 1-2 sets - 20 reps - Forward Backward Monster Walk with Band at Thighs and Counter Support  - 1 x daily - 5 x weekly - 3 sets - 10 reps - Tandem Walking with Counter Support  - 1 x daily - 5 x weekly - 3 sets - 10 reps - Backward Tandem Walking with Counter Support  - 1 x daily - 5 x weekly - 3 sets - 10 reps - Side Stepping with Resistance at Thighs and Counter Support  - 1 x daily - 5 x weekly - 3 sets - 10 reps

## 2022-12-01 ENCOUNTER — Ambulatory Visit (HOSPITAL_COMMUNITY): Payer: BLUE CROSS/BLUE SHIELD

## 2022-12-01 ENCOUNTER — Ambulatory Visit: Payer: BLUE CROSS/BLUE SHIELD

## 2022-12-01 ENCOUNTER — Ambulatory Visit: Payer: BLUE CROSS/BLUE SHIELD | Admitting: Physical Therapy

## 2022-12-03 ENCOUNTER — Ambulatory Visit (HOSPITAL_COMMUNITY)
Admission: RE | Admit: 2022-12-03 | Discharge: 2022-12-03 | Disposition: A | Discharge status: Home / Self Care | Payer: Medicaid Other | Source: Ambulatory Visit | Attending: Obstetrics and Gynecology | Admitting: Obstetrics and Gynecology

## 2022-12-03 DIAGNOSIS — D252 Subserosal leiomyoma of uterus: Secondary | ICD-10-CM | POA: Insufficient documentation

## 2022-12-03 DIAGNOSIS — D251 Intramural leiomyoma of uterus: Secondary | ICD-10-CM | POA: Diagnosis present

## 2022-12-03 DIAGNOSIS — D250 Submucous leiomyoma of uterus: Secondary | ICD-10-CM | POA: Insufficient documentation

## 2022-12-03 DIAGNOSIS — N920 Excessive and frequent menstruation with regular cycle: Secondary | ICD-10-CM | POA: Insufficient documentation

## 2022-12-08 ENCOUNTER — Ambulatory Visit: Payer: Medicaid Other | Admitting: Physical Therapy

## 2022-12-08 ENCOUNTER — Other Ambulatory Visit: Payer: Self-pay | Admitting: Nurse Practitioner

## 2022-12-08 ENCOUNTER — Ambulatory Visit: Payer: Medicaid Other

## 2022-12-08 ENCOUNTER — Ambulatory Visit: Payer: Self-pay

## 2022-12-08 DIAGNOSIS — J069 Acute upper respiratory infection, unspecified: Secondary | ICD-10-CM

## 2022-12-08 MED ORDER — BENZONATATE 200 MG PO CAPS
200.0000 mg | ORAL_CAPSULE | Freq: Two times a day (BID) | ORAL | 1 refills | Status: DC | PRN
Start: 2022-12-08 — End: 2023-05-17

## 2022-12-08 NOTE — Telephone Encounter (Signed)
Chief Complaint: Cough Symptoms: Cough, runny nose, congestion  Frequency: comes and goes  Pertinent Negatives: Patient denies SOB, chest pain, sore throat, fever Disposition: [] ED /[] Urgent Care (no appt availability in office) / [] Appointment(In office/virtual)/ []  [facility]/ [x] Home Care/ [] Refused Recommended Disposition /[] [place] [facility]/ []  Follow-up with PCP Additional Notes: Patient states she has a moderate runny nose, cough, and congestion that started yesterday. Patient states that she was told not to take DayQuil while taking Amlodipine. Care advice was given and patient stated she would try over the counter medication. Patient also asked if she can be prescribed something for the cough and if it can be sent to [facility] on [address]. Patient also asked for the letter that was written on 11/06/22 to printed so she can come by to pick it up as soon as possible. Advised patient I would forward request to PCP for additional recommendations. Patient verbalized understanding.   Summary: "Head cold" Medication questions   Pt has medication questions concerns wants to know what she can take to reduce her symptoms. Declined to disclose her symptoms when asked. Says she has a head cold         Reason for Disposition  Care advice for mild cough, questions about  Answer Assessment - Initial Assessment Questions 1. ONSET: "When did the nasal discharge start?"      Yesterday 2. AMOUNT: "How much discharge is there?"      Moderate 3. COUGH: "Do you have a cough?" If Yes, ask: "Describe the color of your sputum" (clear, white, yellow, green)     Mild cough  4. RESPIRATORY DISTRESS: "Describe your breathing."      Normal  5. FEVER: "Do you have a fever?" If Yes, ask: "What is your temperature, how was it measured, and when did it start?"     No I don't thing so 6. SEVERITY: "Overall, how bad are you feeling right now?" (e.g., doesn't interfere with normal activities,  staying home from school/work, staying in bed)      I don't fell that bad  7. OTHER SYMPTOMS: "Do you have any other symptoms?" (e.g., sore throat, earache, wheezing, vomiting)     None  Protocols used: Common Cold-A-AH

## 2022-12-08 NOTE — Telephone Encounter (Signed)
Cough medication has been sent

## 2022-12-08 NOTE — Therapy (Deleted)
OUTPATIENT SPEECH LANGUAGE PATHOLOGY EVALUATION   Patient Name: Connie Herrera MRN: [id number] DOB:February 15, 1976, 47 y.o., female Today's Date: 12/08/2022  PCP: Claiborne Rigg, NP REFERRING PROVIDER: Claiborne Rigg, NP  END OF SESSION:   Past Medical History:  Diagnosis Date   Brain lesion    COVID-19 04/2020   CVA (cerebral vascular accident) (HCC) 04/2022   Diplopia    Hypertension    Iron deficiency anemia    Past Surgical History:  Procedure Laterality Date   AORTOGRAM N/A 04/14/2022   Procedure: DIAGNOSTIC AORTOGRAM;  Surgeon: Victorino Sparrow, MD;  Location: [facility] OR;  Service: Vascular;  Laterality: N/A;   BUBBLE STUDY  06/15/2022   Procedure: BUBBLE STUDY;  Surgeon: Maisie Fus, MD;  Location: [facility] ENDOSCOPY;  Service: Cardiovascular;;   ENDARTERECTOMY Right 04/14/2022   Procedure: CAROTID CUTDOWN WITH BRACHIOCEPHALIC STENTING, WITH ULTRASOUND OF THE RIGHT FEMORAL VEIN;  Surgeon: Victorino Sparrow, MD;  Location: [facility] OR;  Service: Vascular;  Laterality: Right;   TEE WITHOUT CARDIOVERSION N/A 06/15/2022   Procedure: TRANSESOPHAGEAL ECHOCARDIOGRAM (TEE);  Surgeon: Maisie Fus, MD;  Location: [facility] ENDOSCOPY;  Service: Cardiovascular;  Laterality: N/A;   TUBAL LIGATION     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   Left arm weakness 04/09/2022   Iron deficiency anemia 04/09/2022   Hypokalemia 04/09/2022   Muscle spasms of both lower extremities 08/14/2020   Fibroid uterus 08/14/2020   Anemia due to blood loss, chronic 01/10/2020   Menorrhagia with regular cycle 01/09/2020   Class 1 obesity due to excess calories with body mass index (BMI) of 34.0 to 34.9 in adult 01/09/2020   Screening breast examination 02/07/2019   Breast pain 02/07/2019   Hypertension 01/26/2019   Paresthesias 08/04/2013   Breast pain, right 03/11/2012    ONSET DATE: 11/13/2022    REFERRING DIAG: I63.9 (ICD-10-CM) - Acute CVA (cerebrovascular accident)  THERAPY DIAG:  No diagnosis found.  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 {OPRCPAIN:27236}  FALLS: Has patient fallen in last 6 months?  No  LIVING ENVIRONMENT: Lives with: lives alone Lives in: House/apartment  PLOF:  Level of assistance: {WUJWJXB:14782} Employment: {SLPemployment:25674}  PATIENT GOALS: ***  OBJECTIVE:   DIAGNOSTIC FINDINGS: MRI Brain 04/09/2022 IMPRESSION: 1. Multiple small areas of diffusion hyperintense signal in the occipital parietal lobes bilaterally. Some of these lesions do mild enhancement. Appearance is nonspecific but could be seen with subacute infarct. Demyelinating disease a consideration but not typical. Also consider inflammatory process such as sarcoid, vasculitis, lupus and PRES. Recommend lumbar puncture for further evaluation. 2. 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 Enhancement is somewhat atypical however is stable. No other areas of hemorrhage. 3. Mild white matter changes in the cerebral hemispheres bilaterally. This may be due to chronic microvascular ischemia. Demyelinating disease not excluded. Correlate with risk factors for small vessel disease.   MRI Brain 09/07/2022 IMPRESSION: 1. No acute intracranial process. 2. 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  COGNITION: Overall cognitive status: Impaired Areas of impairment:  {  cognitiveimpairmentslp:27409} Functional deficits: ***  COGNITIVE  COMMUNICATION: Following directions: {commands:24018}  Auditory comprehension: {WFL-Impaired:25365} Verbal expression: {WFL-Impaired:25365} Functional communication: {WFL-Impaired:25365}  ORAL MOTOR EXAMINATION: Overall status: {OMESLP2:27645} Comments: ***  STANDARDIZED ASSESSMENTS: {SLPstandardizedassessment:27092}  PATIENT REPORTED OUTCOME MEASURES (PROM): {SLPPROM:27095}   TODAY'S TREATMENT:                                                                                                                                         DATE: ***   PATIENT EDUCATION: Education details: *** Person educated: {Person educated:25204} Education method: {Education Method:25205} Education comprehension: {Education Comprehension:25206}   GOALS: Goals reviewed with patient? Yes  SHORT TERM GOALS: Target date: 01/05/2023   *** Baseline: Goal status: INITIAL  2.  *** Baseline:  Goal status: INITIAL  3.  *** Baseline:  Goal status: INITIAL  4.  *** Baseline:  Goal status: INITIAL  5.  *** Baseline:  Goal status: INITIAL  6.  *** Baseline:  Goal status: INITIAL  LONG TERM GOALS: Target date: ***  *** Baseline:  Goal status: INITIAL  2.  *** Baseline:  Goal status: INITIAL  3.  *** Baseline:  Goal status: INITIAL  4.  *** Baseline:  Goal status: INITIAL  5.  *** Baseline:  Goal status: INITIAL  6.  *** Baseline:  Goal status: INITIAL  ASSESSMENT:  CLINICAL IMPRESSION: Patient is a 47 y.o. F who was seen today for CVA in January 2024.   OBJECTIVE IMPAIRMENTS: include {SLPOBJIMP:27107}. These impairments are limiting patient from {SLPLIMIT:27108}. Factors affecting potential to achieve goals and functional outcome are {SLP factors:25450}.. Patient will benefit from skilled SLP services to address above impairments and improve overall function.  REHAB POTENTIAL: {rehabpotential:25112}  PLAN:  SLP FREQUENCY: {rehab frequency:25116}  SLP DURATION:  {rehab duration:25117}  PLANNED INTERVENTIONS: {SLP treatment/interventions:25449}    Gracy Racer, CCC-SLP 12/08/2022, 9:12 AM

## 2022-12-09 DIAGNOSIS — Z0271 Encounter for disability determination: Secondary | ICD-10-CM

## 2022-12-10 ENCOUNTER — Ambulatory Visit (INDEPENDENT_AMBULATORY_CARE_PROVIDER_SITE_OTHER): Payer: Medicaid Other | Admitting: Diagnostic Neuroimaging

## 2022-12-10 ENCOUNTER — Ambulatory Visit (INDEPENDENT_AMBULATORY_CARE_PROVIDER_SITE_OTHER): Payer: Self-pay | Admitting: Diagnostic Neuroimaging

## 2022-12-10 DIAGNOSIS — M79602 Pain in left arm: Secondary | ICD-10-CM | POA: Diagnosis not present

## 2022-12-10 DIAGNOSIS — Z0289 Encounter for other administrative examinations: Secondary | ICD-10-CM

## 2022-12-14 ENCOUNTER — Encounter: Payer: Self-pay | Admitting: Occupational Therapy

## 2022-12-14 ENCOUNTER — Ambulatory Visit: Payer: Medicaid Other | Attending: Nurse Practitioner | Admitting: Occupational Therapy

## 2022-12-14 ENCOUNTER — Ambulatory Visit: Payer: Medicaid Other | Admitting: Physical Therapy

## 2022-12-14 DIAGNOSIS — R2689 Other abnormalities of gait and mobility: Secondary | ICD-10-CM | POA: Insufficient documentation

## 2022-12-14 DIAGNOSIS — R41844 Frontal lobe and executive function deficit: Secondary | ICD-10-CM | POA: Insufficient documentation

## 2022-12-14 DIAGNOSIS — M79602 Pain in left arm: Secondary | ICD-10-CM | POA: Insufficient documentation

## 2022-12-14 DIAGNOSIS — Z8673 Personal history of transient ischemic attack (TIA), and cerebral infarction without residual deficits: Secondary | ICD-10-CM | POA: Insufficient documentation

## 2022-12-14 DIAGNOSIS — R2681 Unsteadiness on feet: Secondary | ICD-10-CM | POA: Diagnosis present

## 2022-12-14 DIAGNOSIS — I69354 Hemiplegia and hemiparesis following cerebral infarction affecting left non-dominant side: Secondary | ICD-10-CM | POA: Insufficient documentation

## 2022-12-14 DIAGNOSIS — R29898 Other symptoms and signs involving the musculoskeletal system: Secondary | ICD-10-CM | POA: Insufficient documentation

## 2022-12-14 DIAGNOSIS — R278 Other lack of coordination: Secondary | ICD-10-CM | POA: Insufficient documentation

## 2022-12-14 DIAGNOSIS — R252 Cramp and spasm: Secondary | ICD-10-CM | POA: Diagnosis not present

## 2022-12-14 DIAGNOSIS — M79622 Pain in left upper arm: Secondary | ICD-10-CM | POA: Insufficient documentation

## 2022-12-14 DIAGNOSIS — M6281 Muscle weakness (generalized): Secondary | ICD-10-CM

## 2022-12-14 NOTE — Therapy (Unsigned)
OUTPATIENT OCCUPATIONAL THERAPY NEURO EVALUATION  Patient Name: Connie Herrera MRN: [id number] DOB:12-10-1975, 47 y.o., female Today's Date: 12/14/2022  PCP: Claiborne Rigg, NP REFERRING PROVIDER: Richardean Sale, DO  END OF SESSION:  OT End of Session - 12/14/22 1357     Visit Number 1    Number of Visits 12   including evaluation   Authorization Type [place] MEDICAID [employer] [place]    OT Start Time 1400    OT Stop Time 1445    OT Time Calculation (min) 45 min    Equipment Utilized During Treatment Testing material    Activity Tolerance Patient tolerated treatment well    Behavior During Therapy [facility] for tasks assessed/performed;Lability             Past Medical History:  Diagnosis Date   Brain lesion    COVID-19 04/2020   CVA (cerebral vascular accident) (HCC) 04/2022   Diplopia    Hypertension    Iron deficiency anemia    Past Surgical History:  Procedure Laterality Date   AORTOGRAM N/A 04/14/2022   Procedure: DIAGNOSTIC AORTOGRAM;  Surgeon: Victorino Sparrow, MD;  Location: [facility] OR;  Service: Vascular;  Laterality: N/A;   BUBBLE STUDY  06/15/2022   Procedure: BUBBLE STUDY;  Surgeon: Maisie Fus, MD;  Location: [facility] ENDOSCOPY;  Service: Cardiovascular;;   ENDARTERECTOMY Right 04/14/2022   Procedure: CAROTID CUTDOWN WITH BRACHIOCEPHALIC STENTING, WITH ULTRASOUND OF THE RIGHT FEMORAL VEIN;  Surgeon: Victorino Sparrow, MD;  Location: [facility] OR;  Service: Vascular;  Laterality: Right;   TEE WITHOUT CARDIOVERSION N/A 06/15/2022   Procedure: TRANSESOPHAGEAL ECHOCARDIOGRAM (TEE);  Surgeon: Maisie Fus, MD;  Location: [facility] ENDOSCOPY;  Service: Cardiovascular;  Laterality: N/A;   TUBAL LIGATION     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   Left arm weakness 04/09/2022   Iron deficiency anemia 04/09/2022   Hypokalemia 04/09/2022   Muscle spasms of both lower  extremities 08/14/2020   Fibroid uterus 08/14/2020   Anemia due to blood loss, chronic 01/10/2020   Menorrhagia with regular cycle 01/09/2020   Class 1 obesity due to excess calories with body mass index (BMI) of 34.0 to 34.9 in adult 01/09/2020   Screening breast examination 02/07/2019   Breast pain 02/07/2019   Hypertension 01/26/2019   Paresthesias 08/04/2013   Breast pain, right 03/11/2012    ONSET DATE: 11/27/2022  REFERRING DIAG: W09.811 (ICD-10-CM) - Left arm weakness M79.602 (ICD-10-CM) - Left arm pain Z86.73 (ICD-10-CM) - History of CVA (cerebrovascular accident)  THERAPY DIAG:  Muscle weakness (generalized)  Other lack of coordination  Hemiplegia and hemiparesis following cerebral infarction affecting left non-dominant side (HCC)  Pain in left upper arm  Frontal lobe and executive function deficit  Rationale for Evaluation and Treatment: Rehabilitation  SUBJECTIVE:   SUBJECTIVE STATEMENT: Patient reports she hasn't got the strength back since her stroke, that she has shaking when holding things and can't grip things very long. She reports have a "Nerve study" 9/5 and nerves are messed up in the back of her arm. Waiting on MRI for neck, added Gapentin - just started it though for restless legs  Pt accompanied by: self  PERTINENT HISTORY:  1. Left arm weakness 2. Left arm pain 3. 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  Can titrate up to gabapentin 400 mg nightly trying to find lowest effective dose  PRECAUTIONS: None  WEIGHT BEARING RESTRICTIONS: No  PAIN:  Are you having pain? Yes: NPRS scale: 7+/10 Pain location: back of the arm and whole neck  Pain description: Neck - spasms Arms - aching Aggravating factors: lifting things Relieving factors: relaxing,  pain pill - oxycodone (2-3x/day)  FALLS: Has patient fallen in last 6 months? No  LIVING ENVIRONMENT: Lives with: lives alone Lives in: House/apartment Stairs: Yes: External: 6 steps; on right going up Has following equipment at home: Quad cane small base and Grab bars  PLOF: Independent  PATIENT GOALS: "To get my arm back stronger so I can go to work"   OBJECTIVE:   HAND DOMINANCE: Right  ADLs: Overall ADLs: Independent Transfers/ambulation related to ADLs: Ind Eating: Cutting food is hard Grooming: Ind UB Dressing: Ind LB Dressing: Ind but has trouble using L hand to adjust clothes Toileting: hard with L hand to adjust clothing, also reports having trouble holding her bladder Bathing: Ind Tub Shower transfers: Tub shower Equipment: Grab bars  IADLs:            Driving: Still driving but can't drive far, eyes are more blurry Shopping: Does this online Light housekeeping: Does as much as she can herself Meal Prep: Psychologist, sport and exercise - not in the oven and only once in a while uses the [employer] mobility: Ind Medication management: Uses the bottles not a pill box Financial management: Ind Handwriting:  No difficulty due to R UE unaffected  MOBILITY STATUS: Independent  POSTURE COMMENTS:  rounded shoulders and forward head Sitting balance: WFL  ACTIVITY TOLERANCE: Activity tolerance: Fair  FUNCTIONAL OUTCOME MEASURES: {OTFUNCTIONALMEASURES:27238}  UPPER EXTREMITY ROM:    Active ROM Right eval Left eval  Shoulder flexion WNL WNL but movements on L side feel heavy  Shoulder abduction    Shoulder adduction    Shoulder extension    Shoulder internal rotation    Shoulder external rotation    Elbow flexion    Elbow extension    Wrist flexion    Wrist extension    Wrist ulnar deviation    Wrist radial deviation    Wrist pronation    Wrist supination    (Blank rows = not tested)  UPPER EXTREMITY MMT:     MMT Right eval Left eval  Shoulder flexion 4+ 4-   Shoulder abduction    Shoulder adduction    Shoulder extension    Shoulder internal rotation    Shoulder external rotation    Middle trapezius    Lower trapezius    Elbow flexion    Elbow extension    Wrist flexion    Wrist extension    Wrist ulnar deviation    Wrist radial deviation    Wrist pronation    Wrist supination    (Blank rows = not tested)  HAND FUNCTION: Grip strength: Right: 66.7, 70.5, 71.2  lbs; Left: 31.7, 34.3, 38.5  lbs Average Right   COORDINATION: 9 Hole Peg test: Right: 23.52  sec; Left: 28.35  sec Box and Blocks:  Right 55blocks, Left 39 blocks  SENSATION: Light touch: Impaired  - L hand feels dull from elbow down  EDEMA: Feet  MUSCLE TONE: WFL  COGNITION: Overall cognitive status: Impaired  VISION: Subjective report: History of diplopia - would cover L eye Baseline vision: No visual deficits Visual history: Changes after the  stroke (dx list includes diplopia)  VISION ASSESSMENT: Tracking/Visual pursuits: {OTTRACKING/VISUALPURSUITS:25369} Diplopia assessment: {OTDIPLOPIA:25373}  Patient has difficulty with following activities due to following visual impairments: ***  PERCEPTION: Not tested  PRAXIS: Not tested  OBSERVATIONS: ***   TODAY'S TREATMENT:                                                                                                                              DATE: ***   PATIENT EDUCATION: Education details: *** Person educated: {Person educated:25204} Education method: {Education Method:25205} Education comprehension: {Education Comprehension:25206}  HOME EXERCISE PROGRAM: ***   GOALS: Goals reviewed with patient? {yes/no:20286}  SHORT TERM GOALS: Target date: ***  *** Baseline: Goal status: INITIAL  2.  *** Baseline:  Goal status: INITIAL  3.  *** Baseline:  Goal status: INITIAL  4.  *** Baseline:  Goal status: INITIAL  5.  *** Baseline:  Goal status: INITIAL  6.  *** Baseline:  Goal  status: INITIAL  LONG TERM GOALS: Target date: ***  *** Baseline:  Goal status: INITIAL  2.  *** Baseline:  Goal status: INITIAL  3.  *** Baseline:  Goal status: INITIAL  4.  *** Baseline:  Goal status: INITIAL  5.  *** Baseline:  Goal status: INITIAL  6.  *** Baseline:  Goal status: INITIAL  ASSESSMENT:  CLINICAL IMPRESSION: Patient is a 47 y.o. female who was seen today for occupational therapy evaluation for ***. Hx includes ***. Patient currently presents *** baseline level of functioning demonstrating functional deficits and impairments as noted below. Pt would benefit from skilled OT services in the outpatient setting to work on impairments as noted below to help pt return to PLOF as able.     PERFORMANCE DEFICITS: in functional skills including {OT physical skills:25468}, cognitive skills including {OT cognitive skills:25469}, and psychosocial skills including {OT psychosocial skills:25470}.   IMPAIRMENTS: are limiting patient from {OT performance deficits:25471}.   CO-MORBIDITIES: {Comorbidities:25485} that affects occupational performance. Patient will benefit from skilled OT to address above impairments and improve overall function.  MODIFICATION OR ASSISTANCE TO COMPLETE EVALUATION: {OT modification:25474}  OT OCCUPATIONAL PROFILE AND HISTORY: {OT PROFILE AND HISTORY:25484}  CLINICAL DECISION MAKING: {OT CDM:25475}  REHAB POTENTIAL: {rehabpotential:25112}  EVALUATION COMPLEXITY: {Evaluation complexity:25115}    PLAN:  OT FREQUENCY: 1-2x/week  OT DURATION: {rehab duration:25117}  PLANNED INTERVENTIONS: {OT Interventions:25467}  RECOMMENDED OTHER SERVICES: ***  CONSULTED AND AGREED WITH PLAN OF CARE: {UJW:11914}  PLAN FOR NEXT SESSION: ***   Victorino Sparrow, OT 12/14/2022, 5:18 PM

## 2022-12-14 NOTE — Therapy (Signed)
OUTPATIENT PHYSICAL THERAPY NEURO TREATMENT - DISCHARGE NOTE   Patient Name: Connie Herrera MRN: [id number] DOB:[birth year], 47 y.o., female Today's Date: 12/14/2022   PCP: Claiborne Rigg, NP REFERRING PROVIDER: Claiborne Rigg, NP   PHYSICAL THERAPY DISCHARGE SUMMARY  Visits from Start of Care: 3  Current functional level related to goals / functional outcomes: Mod I   Remaining deficits: Impaired balance, decreased functional LE strength   Education / Equipment: Handout for HEP   Patient agrees to discharge. Patient goals were partially met. Patient is being discharged due to lack of progress.   END OF SESSION:  PT End of Session - 12/14/22 1448     Visit Number 3    Number of Visits 5   4 + eval   Date for PT Re-Evaluation 12/25/22   pushed out due to scheduling delay   Authorization Type [employer]   Billing secondary as primary insurance is OON   PT Start Time 1448   from OT session   PT Stop Time 1509   d/c   PT Time Calculation (min) 21 min    Equipment Utilized During Treatment Gait belt    Activity Tolerance Patient tolerated treatment well    Behavior During Therapy [facility] for tasks assessed/performed             Past Medical History:  Diagnosis Date   Brain lesion    COVID-19 04/2020   CVA (cerebral vascular accident) (HCC) 04/2022   Diplopia    Hypertension    Iron deficiency anemia    Past Surgical History:  Procedure Laterality Date   AORTOGRAM N/A 04/14/2022   Procedure: DIAGNOSTIC AORTOGRAM;  Surgeon: Victorino Sparrow, MD;  Location: [facility] OR;  Service: Vascular;  Laterality: N/A;   BUBBLE STUDY  06/15/2022   Procedure: BUBBLE STUDY;  Surgeon: Maisie Fus, MD;  Location: [facility] ENDOSCOPY;  Service: Cardiovascular;;   ENDARTERECTOMY Right 04/14/2022   Procedure: CAROTID CUTDOWN WITH BRACHIOCEPHALIC STENTING, WITH ULTRASOUND OF THE RIGHT FEMORAL VEIN;  Surgeon: Victorino Sparrow, MD;  Location: [facility] OR;  Service: Vascular;  Laterality: Right;    TEE WITHOUT CARDIOVERSION N/A 06/15/2022   Procedure: TRANSESOPHAGEAL ECHOCARDIOGRAM (TEE);  Surgeon: Maisie Fus, MD;  Location: [facility] ENDOSCOPY;  Service: Cardiovascular;  Laterality: N/A;   TUBAL LIGATION     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   Left arm weakness 04/09/2022   Iron deficiency anemia 04/09/2022   Hypokalemia 04/09/2022   Muscle spasms of both lower extremities 08/14/2020   Fibroid uterus 08/14/2020   Anemia due to blood loss, chronic 01/10/2020   Menorrhagia with regular cycle 01/09/2020   Class 1 obesity due to excess calories with body mass index (BMI) of 34.0 to 34.9 in adult 01/09/2020   Screening breast examination 02/07/2019   Breast pain 02/07/2019   Hypertension 01/26/2019   Paresthesias 08/04/2013   Breast pain, right 03/11/2012    ONSET DATE: CVA on 04/09/2022  REFERRING DIAG: 09/22/2022  THERAPY DIAG:  Other abnormalities of gait and mobility  Muscle weakness (generalized)  Unsteadiness on feet  Rationale for Evaluation and Treatment: Rehabilitation  SUBJECTIVE:  SUBJECTIVE STATEMENT: Pt reports no falls since last visit. Pt reports she is having more pain in her arm today because she lifted her mattress to turn it over, is aware that she was overdoing it.  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 No  PRECAUTIONS: Fall and Other: right brachiocephalic stent - right wrist cuff for BP  RED FLAGS: None   WEIGHT BEARING RESTRICTIONS: No  FALLS: Has patient fallen in last 6 months?  No  LIVING ENVIRONMENT: Lives with: lives alone Lives in: House/apartment Stairs: Yes: External: 6 steps; on right going up Has following equipment at home: None  PLOF: Independent  PATIENT GOALS: "To get my arm back stronger so I can go to work"  OBJECTIVE:   DIAGNOSTIC FINDINGS:  MRI Brain 04/09/2022 IMPRESSION: 1. Multiple small areas of diffusion hyperintense signal in the occipital parietal lobes bilaterally. Some of these lesions do mild enhancement. Appearance is nonspecific but could be seen with subacute infarct. Demyelinating disease a consideration but not typical. Also consider inflammatory process such as sarcoid, vasculitis, lupus and PRES. Recommend lumbar puncture for further evaluation. 2. 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 Enhancement is somewhat atypical however is stable. No other areas of hemorrhage. 3. Mild white matter changes in the cerebral hemispheres bilaterally. This may be due to chronic microvascular ischemia. Demyelinating disease not excluded. Correlate with risk factors for small vessel disease.  MRI Brain 09/07/2022 IMPRESSION: 1. No acute intracranial process. 2. 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  Pending nerve conduction study.  COGNITION: Overall cognitive status: Within functional limits for tasks assessed - pt reports cognitive fog and some difficulty with memory   SENSATION: Light touch: WFL  COORDINATION: LE RAMS:  WNL BLE Heel-to-shin:  WNL  EDEMA:  None noted in BLE  MUSCLE TONE: No hypertonicity in LLE, difficulty discerning mild clonus vs pt volitional response to quick DF  POSTURE: forward head  LOWER EXTREMITY ROM:     Active  Right Eval Left Eval  Hip flexion Grossly WFL  Hip extension   Hip abduction   Hip  adduction   Hip internal rotation   Hip external rotation   Knee flexion   Knee extension   Ankle dorsiflexion   Ankle plantarflexion    Ankle inversion    Ankle eversion     (Blank rows = not tested)  LOWER EXTREMITY MMT:    MMT Right Eval Left Eval  Hip flexion Grossly 4+/5  Hip extension   Hip abduction   Hip adduction   Hip internal rotation   Hip external rotation   Knee flexion   Knee extension   Ankle dorsiflexion   Ankle plantarflexion    Ankle inversion    Ankle eversion    (Blank rows = not tested)  BED MOBILITY:  Sit to supine Complete Independence Supine to sit Complete Independence Rolling to Right Complete Independence Rolling to Left Complete Independence  TRANSFERS: Assistive device utilized: None  Sit to stand: Complete Independence Stand to sit: Complete Independence Chair to chair: Complete Independence  GAIT: Gait pattern: step through pattern, decreased arm swing- Left, and narrow BOS Distance walked: various clinic distances Assistive device utilized: None  Level of assistance: Complete Independence  FUNCTIONAL TESTS:  5 times sit to stand: 13.62 seconds no UE support 10 meter walk test: 9.32 seconds no AD ind = 1.07 m/sec OR 3.54 ft/sec Functional gait assessment: 21/30   [facility] PT Assessment - 12/14/22 1500       Ambulation/Gait   Gait velocity 32.8 ft over 12.82 sec = 2.56 ft/sec      Standardized Balance Assessment   Standardized Balance Assessment Five Times Sit to Stand    Five times sit to stand comments  19.4 sec   no UE support     Functional Gait  Assessment   Gait assessed  Yes    Gait Level Surface Walks 20 ft, slow speed, abnormal gait pattern, evidence for imbalance or deviates 10-15 in outside of the 12 in walkway width. Requires more than 7 sec to ambulate 20 ft.    Change in Gait Speed Makes only minor adjustments to walking speed, or accomplishes a change in speed with significant gait deviations, deviates 10-15 in  outside the 12 in walkway width, or changes speed but loses balance but is able to recover and continue walking.    Gait with Horizontal Head Turns Performs head turns smoothly with slight change in gait velocity (eg, minor disruption to smooth gait path), deviates 6-10 in outside 12 in walkway width, or uses an assistive device.    Gait with Vertical Head Turns Performs task with slight change in gait velocity (eg, minor disruption to smooth gait path), deviates 6 - 10 in outside 12 in walkway width or uses assistive device    Gait and Pivot Turn Pivot turns safely in greater than 3 sec and stops with no loss of balance, or pivot turns safely within 3 sec and stops with mild imbalance, requires small steps to catch balance.    Step Over Obstacle Is able to step over one shoe box (4.5 in total height) without changing gait speed. No evidence of imbalance.    Gait with Narrow Base of Support Ambulates 7-9 steps.    Gait with Eyes Closed Walks 20 ft, uses assistive device, slower speed, mild gait deviations, deviates 6-10 in outside 12 in walkway width. Ambulates 20 ft in less than 9 sec but greater than 7 sec.    Ambulating Backwards Walks 20 ft, uses assistive device, slower speed, mild gait deviations, deviates 6-10 in outside 12 in walkway width.    Steps Alternating feet, must use rail.    Total Score 18    FGA comment: 18/30, high fall risk             PATIENT SURVEYS:  FOTO Not captured at intake.  TODAY'S TREATMENT:                                                                                                                               TherAct For LTG assessment:  [facility] PT Assessment - 12/14/22 1500  Ambulation/Gait   Gait velocity 32.8 ft over 12.82 sec = 2.56 ft/sec      Standardized Balance Assessment   Standardized Balance Assessment Five Times Sit to Stand    Five times sit to stand comments  19.4 sec   no UE support     Functional Gait  Assessment   Gait  assessed  Yes    Gait Level Surface Walks 20 ft, slow speed, abnormal gait pattern, evidence for imbalance or deviates 10-15 in outside of the 12 in walkway width. Requires more than 7 sec to ambulate 20 ft.    Change in Gait Speed Makes only minor adjustments to walking speed, or accomplishes a change in speed with significant gait deviations, deviates 10-15 in outside the 12 in walkway width, or changes speed but loses balance but is able to recover and continue walking.    Gait with Horizontal Head Turns Performs head turns smoothly with slight change in gait velocity (eg, minor disruption to smooth gait path), deviates 6-10 in outside 12 in walkway width, or uses an assistive device.    Gait with Vertical Head Turns Performs task with slight change in gait velocity (eg, minor disruption to smooth gait path), deviates 6 - 10 in outside 12 in walkway width or uses assistive device    Gait and Pivot Turn Pivot turns safely in greater than 3 sec and stops with no loss of balance, or pivot turns safely within 3 sec and stops with mild imbalance, requires small steps to catch balance.    Step Over Obstacle Is able to step over one shoe box (4.5 in total height) without changing gait speed. No evidence of imbalance.    Gait with Narrow Base of Support Ambulates 7-9 steps.    Gait with Eyes Closed Walks 20 ft, uses assistive device, slower speed, mild gait deviations, deviates 6-10 in outside 12 in walkway width. Ambulates 20 ft in less than 9 sec but greater than 7 sec.    Ambulating Backwards Walks 20 ft, uses assistive device, slower speed, mild gait deviations, deviates 6-10 in outside 12 in walkway width.    Steps Alternating feet, must use rail.    Total Score 18    FGA comment: 18/30, high fall risk            Verbally reviewed HEP and provided resistance bands for progression.  PATIENT EDUCATION: Education details: continue HEP (reviewed HEP), results of OM and functional  implications Person educated: Patient Education method: Explanation Education comprehension: verbalized understanding  HOME EXERCISE PROGRAM: Access Code: [email or web address] URL: [email or web address] Date: 11/27/2022 Prepared by: Camille Bal  Exercises - Sit to Stand with Arms Crossed  - 1 x daily - 5 x weekly - 3 sets - 10 reps - Marching with Resistance  - 1 x daily - 5 x weekly - 1-2 sets - 20 reps - Forward Backward Monster Walk with Band at Thighs and Counter Support  - 1 x daily - 5 x weekly - 3 sets - 10 reps - Tandem Walking with Counter Support  - 1 x daily - 5 x weekly - 3 sets - 10 reps - Backward Tandem Walking with Counter Support  - 1 x daily - 5 x weekly - 3 sets - 10 reps - Side Stepping with Resistance at Thighs and Counter Support  - 1 x daily - 5 x weekly - 3 sets - 10 reps  GOALS: Goals reviewed with patient? Yes  SHORT TERM GOALS = LONG TERM  GOALS: Target date: 12/11/2022  Pt will be independent and compliant with strengthening and balance HEP in order to maintain functional progress and improve mobility. Baseline:  To be established. Goal status: MET  2.  Pt will decrease 5xSTS to </=12 seconds in order to demonstrate decreased risk for falls and improved functional bilateral LE strength and power. Baseline: 13.62 seconds no UE support, 19.4 sec no UE support (9/9) Goal status: NOT MET  3.  Pt will demonstrate a gait speed of >/=3.74 feet/sec in order to decrease risk for falls. Baseline: 3.54 ft/sec, 2.56 ft/sec no AD (9/9) Goal status: NOT MET  4.  Pt will improve FGA score to >/=25/30 in order to demonstrate improved balance and decreased fall risk. Baseline: 21/30, 18/30 (9/9) Goal status: NOT MET  ASSESSMENT:  CLINICAL IMPRESSION: Emphasis of skilled PT session on assessing LTG and reviewing HEP in preparation for d/c from OPPT services this date. Pt has only met 1/4 STG due to being independent with her HEP. Pt exhibited worse scores on  5xSTS, gait speed, and FGA indicating ongoing functional LE weakness, increased fall risk, and impaired balance. Pt agreeable to d/c from OPPT services this date due to lack of progress and will continue with HEP independently at home.   OBJECTIVE IMPAIRMENTS: decreased activity tolerance, decreased balance, decreased knowledge of condition, decreased strength, improper body mechanics, and pain.   ACTIVITY LIMITATIONS: carrying, lifting, squatting, stairs, reach over head, locomotion level, and caring for others  PARTICIPATION LIMITATIONS: meal prep, cleaning, laundry, community activity, and occupation  PERSONAL FACTORS: Fitness, Past/current experiences, Time since onset of injury/illness/exacerbation, and 1-2 comorbidities: HTN, palpitations  are also affecting patient's functional outcome.   REHAB POTENTIAL: Excellent  CLINICAL DECISION MAKING: Stable/uncomplicated  EVALUATION COMPLEXITY: Low    Peter Congo, PT, DPT, CSRS  12/14/2022, 3:10 PM

## 2022-12-15 ENCOUNTER — Ambulatory Visit: Payer: Medicaid Other

## 2022-12-15 NOTE — Therapy (Deleted)
OUTPATIENT SPEECH LANGUAGE PATHOLOGY EVALUATION   Patient Name: Connie Herrera MRN: [id number] DOB:[birth year], 47 y.o., female Today's Date: 12/15/2022  PCP: Claiborne Rigg, NP REFERRING PROVIDER: Claiborne Rigg, NP  END OF SESSION:   Past Medical History:  Diagnosis Date   Brain lesion    COVID-19 04/2020   CVA (cerebral vascular accident) (HCC) 04/2022   Diplopia    Hypertension    Iron deficiency anemia    Past Surgical History:  Procedure Laterality Date   AORTOGRAM N/A 04/14/2022   Procedure: DIAGNOSTIC AORTOGRAM;  Surgeon: Victorino Sparrow, MD;  Location: [facility] OR;  Service: Vascular;  Laterality: N/A;   BUBBLE STUDY  06/15/2022   Procedure: BUBBLE STUDY;  Surgeon: Maisie Fus, MD;  Location: [facility] ENDOSCOPY;  Service: Cardiovascular;;   ENDARTERECTOMY Right 04/14/2022   Procedure: CAROTID CUTDOWN WITH BRACHIOCEPHALIC STENTING, WITH ULTRASOUND OF THE RIGHT FEMORAL VEIN;  Surgeon: Victorino Sparrow, MD;  Location: [facility] OR;  Service: Vascular;  Laterality: Right;   TEE WITHOUT CARDIOVERSION N/A 06/15/2022   Procedure: TRANSESOPHAGEAL ECHOCARDIOGRAM (TEE);  Surgeon: Maisie Fus, MD;  Location: [facility] ENDOSCOPY;  Service: Cardiovascular;  Laterality: N/A;   TUBAL LIGATION     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   Left arm weakness 04/09/2022   Iron deficiency anemia 04/09/2022   Hypokalemia 04/09/2022   Muscle spasms of both lower extremities 08/14/2020   Fibroid uterus 08/14/2020   Anemia due to blood loss, chronic 01/10/2020   Menorrhagia with regular cycle 01/09/2020   Class 1 obesity due to excess calories with body mass index (BMI) of 34.0 to 34.9 in adult 01/09/2020   Screening breast examination 02/07/2019   Breast pain 02/07/2019   Hypertension 01/26/2019   Paresthesias 08/04/2013   Breast pain, right 03/11/2012    ONSET DATE: 11/13/2022    REFERRING DIAG: I63.9 (ICD-10-CM) - Acute CVA (cerebrovascular accident)  THERAPY DIAG:  No diagnosis found.  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 {OPRCPAIN:27236}  FALLS: Has patient fallen in last 6 months?  No  LIVING ENVIRONMENT: Lives with: lives alone Lives in: House/apartment  PLOF:  Level of assistance: {LKGMWNU:27253} Employment: {SLPemployment:25674}  PATIENT GOALS: ***  OBJECTIVE:   DIAGNOSTIC FINDINGS: MRI Brain 04/09/2022 IMPRESSION: 1. Multiple small areas of diffusion hyperintense signal in the occipital parietal lobes bilaterally. Some of these lesions do mild enhancement. Appearance is nonspecific but could be seen with subacute infarct. Demyelinating disease a consideration but not typical. Also consider inflammatory process such as sarcoid, vasculitis, lupus and PRES. Recommend lumbar puncture for further evaluation. 2. 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 Enhancement is somewhat atypical however is stable. No other areas of hemorrhage. 3. Mild white matter changes in the cerebral hemispheres bilaterally. This may be due to chronic microvascular ischemia. Demyelinating disease not excluded. Correlate with risk factors for small vessel disease.   MRI Brain 09/07/2022 IMPRESSION: 1. No acute intracranial process. 2. 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  COGNITION: Overall cognitive status: Impaired Areas of impairment:  {  cognitiveimpairmentslp:27409} Functional deficits: ***  COGNITIVE  COMMUNICATION: Following directions: {commands:24018}  Auditory comprehension: {WFL-Impaired:25365} Verbal expression: {WFL-Impaired:25365} Functional communication: {WFL-Impaired:25365}  ORAL MOTOR EXAMINATION: Overall status: {OMESLP2:27645} Comments: ***  STANDARDIZED ASSESSMENTS: {SLPstandardizedassessment:27092}  PATIENT REPORTED OUTCOME MEASURES (PROM): {SLPPROM:27095}   TODAY'S TREATMENT:                                                                                                                                         DATE: ***   PATIENT EDUCATION: Education details: *** Person educated: {Person educated:25204} Education method: {Education Method:25205} Education comprehension: {Education Comprehension:25206}   GOALS: Goals reviewed with patient? Yes  SHORT TERM GOALS: Target date: 01/05/2023   *** Baseline: Goal status: INITIAL  2.  *** Baseline:  Goal status: INITIAL  3.  *** Baseline:  Goal status: INITIAL  4.  *** Baseline:  Goal status: INITIAL  5.  *** Baseline:  Goal status: INITIAL  6.  *** Baseline:  Goal status: INITIAL  LONG TERM GOALS: Target date: ***  *** Baseline:  Goal status: INITIAL  2.  *** Baseline:  Goal status: INITIAL  3.  *** Baseline:  Goal status: INITIAL  4.  *** Baseline:  Goal status: INITIAL  5.  *** Baseline:  Goal status: INITIAL  6.  *** Baseline:  Goal status: INITIAL  ASSESSMENT:  CLINICAL IMPRESSION: Patient is a 47 y.o. F who was seen today for CVA in January 2024.   OBJECTIVE IMPAIRMENTS: include {SLPOBJIMP:27107}. These impairments are limiting patient from {SLPLIMIT:27108}. Factors affecting potential to achieve goals and functional outcome are {SLP factors:25450}.. Patient will benefit from skilled SLP services to address above impairments and improve overall function.  REHAB POTENTIAL: {rehabpotential:25112}  PLAN:  SLP FREQUENCY: {rehab frequency:25116}  SLP DURATION:  {rehab duration:25117}  PLANNED INTERVENTIONS: {SLP treatment/interventions:25449}    Gracy Racer, CCC-SLP 12/15/2022, 9:24 AM

## 2022-12-15 NOTE — Procedures (Signed)
[facility]  NCS (NERVE CONDUCTION STUDY) WITH EMG (ELECTROMYOGRAPHY) REPORT   STUDY DATE: 12/10/22 PATIENT NAME: Connie Herrera DOB: [birth year] MRN: [id number]  ORDERING CLINICIAN: Joycelyn Schmid, MD   TECHNOLOGIST: Jenelle Mages ELECTROMYOGRAPHER: Glenford Bayley. Deklyn Gibbon, MD  CLINICAL INFORMATION: 47 year old female with left arm numbness.  FINDINGS: NERVE CONDUCTION STUDY:  Left median and left ulnar motor responses are normal.  Left ulnar F-wave latency is normal.  Left median, left ulnar, left radial sensory responses are normal.  Left median to ulnar transcarpal comparison study is normal.   NEEDLE ELECTROMYOGRAPHY:  Needle examination of left upper extremity is notable for decreased recruitment in left triceps and left deltoid on exertion.  Left triceps also demonstrates large size motor units on exertion.  No abnormal spontaneous activity.   IMPRESSION:   This study demonstrates: -Normal nerve conduction studies. -Needle examination of left upper extremity demonstrates chronic denervation in left triceps and left deltoid, may be related to underlying cervical radiculopathies (C5-C7).  Correlate clinically.    INTERPRETING PHYSICIAN:  Suanne Marker, MD Certified in Neurology, Neurophysiology and Neuroimaging  [facility] [address], [address] [place], [place] [address] [phone]   [facility]    Nerve / Sites Muscle Latency Ref. Amplitude Ref. Rel Amp Segments Distance Velocity Ref. Area    ms ms mV mV %  cm m/s m/s mVms  L Median - APB     Wrist APB 3.0 ?4.4 4.8 ?4.0 100 Wrist - APB 7   17.7     Upper arm APB 7.8  4.5  94.3 Upper arm - Wrist 28 58 ?49 15.7  L Ulnar - ADM     Wrist ADM 2.3 ?3.3 7.8 ?6.0 100 Wrist - ADM 7   20.8     B.Elbow ADM 4.2  7.0  89.9 B.Elbow - Wrist 11 58 ?49 18.5     A.Elbow ADM 7.1  6.8  97.6 A.Elbow - B.Elbow 17 57 ?49 16.9         SNC    Nerve / Sites Rec. Site Peak Lat Ref.  Amp  Ref. Segments Distance Peak Diff Ref.    ms ms V V  cm ms ms  L Radial - Anatomical snuff box (Forearm)     Forearm Wrist 2.5 ?2.9 46 ?15 Forearm - Wrist 10    L Median, Ulnar - Transcarpal comparison     Median Palm Wrist 1.9 ?2.2 42 ?35 Median Palm - Wrist 8       Ulnar Palm Wrist 1.8 ?2.2 22 ?12 Ulnar Palm - Wrist 8          Median Palm - Ulnar Palm  0.2 ?0.4  L Median - Orthodromic (Dig II, Mid palm)     Dig II Wrist 2.9 ?3.4 27 ?10 Dig II - Wrist 13    L Ulnar - Orthodromic, (Dig V, Mid palm)     Dig V Wrist 2.5 ?3.1 7 ?5 Dig V - Wrist 51               F  Wave    Nerve F Lat Ref.   ms ms  L Ulnar - ADM 25.4 ?32.0       EMG Summary Table    Spontaneous MUAP Recruitment  Muscle IA Fib PSW Fasc Other Amp Dur. Poly Pattern  L. Triceps brachii Normal None None None _______ Increased Normal Normal Reduced  L. Biceps brachii Normal None None None _______ Normal Normal  Normal Normal  L. Deltoid Normal None None None _______ Normal Normal Normal Reduced  L. Flexor carpi radialis Normal None None None _______ Normal Normal Normal Normal  L. First dorsal interosseous Normal None None None _______ Normal Normal Normal Normal  L. Cervical paraspinals Normal None None None _______ Normal Normal Normal Normal

## 2022-12-17 NOTE — Progress Notes (Deleted)
Connie Herrera D.[facility] [address] [place] [address] Phone: [phone]   Assessment and Plan:     There are no diagnoses linked to this encounter.  ***   Pertinent previous records reviewed include ***   Follow Up: ***     Subjective:   I, Connie Herrera, am serving as a Neurosurgeon for Doctor Richardean Sale   Chief Complaint: left arm pain    HPI:  11/27/2022 Patient is a 47 year old female complaining of left arm pain. Patient states worsening neck pain x 3 months. Pain radiating into the left arm. Pt is RHD. Notes n/t and weakness in the left arm, related to stroke. Also notes HA associated with neck pain. Minimal relief with analgesics, more relief with muscle relaxer's.    Leg pain x 2 years, spasms. Heat makes the leg pain worse. Has been treated in the ED for this in the past. Having pain in both calves, feel tight and swollen, random bruising present at times. Unaware of increased warmth or erythema in the legs. Pain comes and goes, worse when legs are elevated, improves when walking. May not get adequate water intake but good dietary potassium intake.   12/18/2022 Patient states    Relevant Historical Information: CVA in January 2024 with residual left-sided weakness, hypertension  Additional pertinent review of systems negative.   Current Outpatient Medications:    amLODipine (NORVASC) 10 MG tablet, Take 1 tablet (10 mg total) by mouth daily., Disp: 90 tablet, Rfl: 3   aspirin EC 81 MG tablet, Take 1 tablet (81 mg total) by mouth daily. Take daily at 6 am. Swallow whole., Disp: 90 tablet, Rfl: 3   benzonatate (TESSALON) 200 MG capsule, Take 1 capsule (200 mg total) by mouth 2 (two) times daily as needed for cough., Disp: 30 capsule, Rfl: 1   cyclobenzaprine (FLEXERIL) 10 MG tablet, Take 10 mg by mouth at bedtime., Disp: , Rfl:    ferrous sulfate 325 (65 FE) MG EC tablet, Take 1 tablet (325 mg total) by mouth 2 (two)  times daily. (Patient taking differently: Take 1 tablet by mouth daily with breakfast.), Disp: 60 tablet, Rfl: 3   gabapentin (NEURONTIN) 100 MG capsule, Take 1-4 capsules (100-400 mg total) by mouth at bedtime as needed., Disp: 50 capsule, Rfl: 0   ibuprofen (ADVIL) 800 MG tablet, Take 800 mg by mouth every 8 (eight) hours as needed. (Patient not taking: Reported on 12/14/2022), Disp: , Rfl:    losartan (COZAAR) 25 MG tablet, Take 1 tablet (25 mg total) by mouth daily., Disp: 90 tablet, Rfl: 3   naloxone (NARCAN) nasal spray 4 mg/0.1 mL, Place 1 spray into the nose as needed., Disp: , Rfl:    oxyCODONE-acetaminophen (PERCOCET) 10-325 MG tablet, Take 1 tablet by mouth every 8 (eight) hours as needed for pain., Disp: , Rfl:    pantoprazole (PROTONIX) 40 MG tablet, TAKE 1 TABLET BY MOUTH EVERY DAY, Disp: 90 tablet, Rfl: 2   rosuvastatin (CRESTOR) 10 MG tablet, Take 1 tablet (10 mg total) by mouth daily., Disp: 90 tablet, Rfl: 3   triamcinolone ointment (KENALOG) 0.1 %, Apply 1 Application topically 2 (two) times daily., Disp: 30 g, Rfl: 0   Vitamin D, Ergocalciferol, (DRISDOL) 1.25 MG (50000 UNIT) CAPS capsule, Take 50,000 Units by mouth every Thursday., Disp: , Rfl:    Objective:     There were no vitals filed for this visit.    There is no height  or weight on file to calculate BMI.    Physical Exam:    ***   Electronically signed by:  Connie Herrera D.[facility] 7:14 AM 12/17/22

## 2022-12-18 ENCOUNTER — Inpatient Hospital Stay: Payer: Medicaid Other | Attending: Hematology and Oncology | Admitting: Hematology and Oncology

## 2022-12-18 ENCOUNTER — Inpatient Hospital Stay: Payer: Medicaid Other

## 2022-12-18 ENCOUNTER — Other Ambulatory Visit: Payer: Self-pay | Admitting: Nurse Practitioner

## 2022-12-18 ENCOUNTER — Ambulatory Visit: Payer: Medicaid Other | Admitting: Sports Medicine

## 2022-12-18 VITALS — BP 140/81 | HR 92 | Temp 97.9°F | Wt 236.4 lb

## 2022-12-18 DIAGNOSIS — Z87891 Personal history of nicotine dependence: Secondary | ICD-10-CM | POA: Insufficient documentation

## 2022-12-18 DIAGNOSIS — Z8616 Personal history of COVID-19: Secondary | ICD-10-CM | POA: Insufficient documentation

## 2022-12-18 DIAGNOSIS — Z8673 Personal history of transient ischemic attack (TIA), and cerebral infarction without residual deficits: Secondary | ICD-10-CM | POA: Diagnosis not present

## 2022-12-18 DIAGNOSIS — D509 Iron deficiency anemia, unspecified: Secondary | ICD-10-CM | POA: Insufficient documentation

## 2022-12-18 DIAGNOSIS — Z79899 Other long term (current) drug therapy: Secondary | ICD-10-CM | POA: Diagnosis not present

## 2022-12-18 DIAGNOSIS — G2581 Restless legs syndrome: Secondary | ICD-10-CM | POA: Insufficient documentation

## 2022-12-18 DIAGNOSIS — Z7901 Long term (current) use of anticoagulants: Secondary | ICD-10-CM | POA: Insufficient documentation

## 2022-12-18 DIAGNOSIS — M79606 Pain in leg, unspecified: Secondary | ICD-10-CM | POA: Diagnosis not present

## 2022-12-18 DIAGNOSIS — I10 Essential (primary) hypertension: Secondary | ICD-10-CM | POA: Insufficient documentation

## 2022-12-18 DIAGNOSIS — N920 Excessive and frequent menstruation with regular cycle: Secondary | ICD-10-CM | POA: Diagnosis not present

## 2022-12-18 DIAGNOSIS — Z862 Personal history of diseases of the blood and blood-forming organs and certain disorders involving the immune mechanism: Secondary | ICD-10-CM | POA: Insufficient documentation

## 2022-12-18 DIAGNOSIS — L309 Dermatitis, unspecified: Secondary | ICD-10-CM

## 2022-12-18 DIAGNOSIS — R0602 Shortness of breath: Secondary | ICD-10-CM | POA: Diagnosis not present

## 2022-12-18 DIAGNOSIS — Z7982 Long term (current) use of aspirin: Secondary | ICD-10-CM | POA: Diagnosis not present

## 2022-12-18 LAB — IRON AND IRON BINDING CAPACITY (CC-WL,HP ONLY)
Iron: 69 ug/dL (ref 28–170)
Saturation Ratios: 17 % (ref 10.4–31.8)
TIBC: 398 ug/dL (ref 250–450)
UIBC: 329 ug/dL (ref 148–442)

## 2022-12-18 LAB — CBC WITH DIFFERENTIAL/PLATELET
Abs Immature Granulocytes: 0.01 10*3/uL (ref 0.00–0.07)
Basophils Absolute: 0 10*3/uL (ref 0.0–0.1)
Basophils Relative: 0 %
Eosinophils Absolute: 0.1 10*3/uL (ref 0.0–0.5)
Eosinophils Relative: 1 %
HCT: 35.2 % — ABNORMAL LOW (ref 36.0–46.0)
Hemoglobin: 11.9 g/dL — ABNORMAL LOW (ref 12.0–15.0)
Immature Granulocytes: 0 %
Lymphocytes Relative: 27 %
Lymphs Abs: 1.8 10*3/uL (ref 0.7–4.0)
MCH: 28.7 pg (ref 26.0–34.0)
MCHC: 33.8 g/dL (ref 30.0–36.0)
MCV: 85 fL (ref 80.0–100.0)
Monocytes Absolute: 0.4 10*3/uL (ref 0.1–1.0)
Monocytes Relative: 6 %
Neutro Abs: 4.4 10*3/uL (ref 1.7–7.7)
Neutrophils Relative %: 66 %
Platelets: 393 10*3/uL (ref 150–400)
RBC: 4.14 MIL/uL (ref 3.87–5.11)
RDW: 12.7 % (ref 11.5–15.5)
WBC: 6.7 10*3/uL (ref 4.0–10.5)
nRBC: 0 % (ref 0.0–0.2)

## 2022-12-18 LAB — FERRITIN: Ferritin: 17 ng/mL (ref 11–307)

## 2022-12-18 NOTE — Progress Notes (Signed)
[facility] CONSULT NOTE  Patient Care Team: Claiborne Rigg, NP as PCP - General (Nurse Practitioner) Maisie Fus, MD as PCP - Cardiology (Cardiology)  CHIEF COMPLAINTS/PURPOSE OF CONSULTATION:  IDA, arterial thrombosis  ASSESSMENT & PLAN:   History of Arterial Blood Clot JAK2 testing neg, PNH panel normal, rest of hypercoag work up done in the hospital was previously reviewed and neg. No new blood clots since last visit.  Currently on Aspirin 81mg  daily for life. No pain in extremities. -Continue Aspirin 81mg  daily.  Restless Leg Syndrome Reports leg discomfort, particularly when elevated. Recently started on Gabapentin. -Trial of Gabapentin recommended.  Iron Deficiency Anemia Taking iron daily. Reports feeling more energetic, no side effects. No pica or shortness of breath. -Continue daily iron supplementation. -Order complete blood count, iron, and ferritin levels to assess response to treatment.  Heavy Menstrual Bleeding Reports heavy menstrual cycles. Considering hysterectomy but concerned about risk of blood clot.  Follow-up in 6 months.  HISTORY OF PRESENTING ILLNESS:  Connie Herrera 47 y.o. 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  She had hypercoagulable workup which was unremarkable and she is currently on Eliquis.  She has been back-and-forth to the ER with several other complaints and now follows up with vascular surgery, cardiology for further recommendations.  With regards to her iron deficiency, she received some IV iron in the hospital and she is currently continuing on oral iron replacement.    Interval history  Discussed the use of AI scribe software for  clinical note transcription with the patient, who gave verbal consent to proceed.  The patient, with a history of a blood clot, presents for follow-up. She reports feeling better since the resolution of the blood clot and is compliant with daily baby aspirin as recommended by her cardiologist. She denies any new blood clots or pain in her extremities. However, she reports ongoing leg pain, which she attributes to restless leg syndrome. She was recently prescribed Gabapentin for this issue, but has not yet tried it.  The patient is also taking daily iron supplements and reports feeling more energetic since starting them. She denies any side effects from the iron or any cravings for ice. She does report occasional shortness of breath, which occurs randomly and is not associated with exertion. She denies any known heart or lung conditions.  The patient also reports heavy menstrual bleeding, which is her main concern at this time. She was considering a hysterectomy, but this has been delayed due to the recent blood clot. She is hoping to be cleared for the procedure by her hematologist and gynecologist. Rest of the pertinent 10 ROS reviewed and neg.  MEDICAL HISTORY:  Past Medical History:  Diagnosis Date   Brain lesion    COVID-19 04/2020   CVA (cerebral vascular accident) (HCC) 04/2022   Diplopia    Hypertension    Iron deficiency anemia     SURGICAL HISTORY: Past Surgical History:  Procedure Laterality Date   AORTOGRAM N/A 04/14/2022   Procedure: DIAGNOSTIC AORTOGRAM;  Surgeon: Victorino Sparrow, MD;  Location: [facility] OR;  Service: Vascular;  Laterality: N/A;   BUBBLE STUDY  06/15/2022   Procedure: BUBBLE STUDY;  Surgeon: Maisie Fus, MD;  Location: [facility]  ENDOSCOPY;  Service: Cardiovascular;;   ENDARTERECTOMY Right 04/14/2022   Procedure: CAROTID CUTDOWN WITH BRACHIOCEPHALIC STENTING, WITH ULTRASOUND OF THE RIGHT FEMORAL VEIN;  Surgeon: Victorino Sparrow, MD;  Location: [facility] OR;  Service: Vascular;   Laterality: Right;   TEE WITHOUT CARDIOVERSION N/A 06/15/2022   Procedure: TRANSESOPHAGEAL ECHOCARDIOGRAM (TEE);  Surgeon: Maisie Fus, MD;  Location: [facility] ENDOSCOPY;  Service: Cardiovascular;  Laterality: N/A;   TUBAL LIGATION      SOCIAL HISTORY: Social History   Socioeconomic History   Marital status: Single    Spouse name: Not on file   Number of children: 4   Years of education: 14   Highest education level: Some college, no degree  Occupational History   Occupation: Dietary Supervisor    Comment: [employer]  Tobacco Use   Smoking status: Former    Current packs/day: 0.00    Average packs/day: 0.5 packs/day for 4.0 years (2.0 ttl pk-yrs)    Types: Cigarettes    Start date: 06/04/2013    Quit date: 06/04/2017    Years since quitting: 5.5    Passive exposure: Past   Smokeless tobacco: Never  Vaping Use   Vaping status: Never Used  Substance and Sexual Activity   Alcohol use: No   Drug use: Not Currently    Types: Marijuana    Comment: 09/30/20 maybe once a week   Sexual activity: Yes    Birth control/protection: Surgical  Other Topics Concern   Not on file  Social History Narrative   Patient lives at home with children.    Patient has 4 children.    Patient is single.    Patient has 2 years of college.    Patient is right handed.    Patient is working full time. [employer].    Social Determinants of Health   Financial Resource Strain: Not on file  Food Insecurity: No Food Insecurity (04/10/2022)   Hunger Vital Sign    Worried About Running Out of Food in the Last Year: Never true    Ran Out of Food in the Last Year: Never true  Transportation Needs: No Transportation Needs (04/10/2022)   PRAPARE - Administrator, Civil Service (Medical): No    Lack of Transportation (Non-Medical): No  Physical Activity: Not on file  Stress: Not on file  Social Connections: Not on file  Intimate Partner Violence: Not At Risk (04/10/2022)   Humiliation,  Afraid, Rape, and Kick questionnaire    Fear of Current or Ex-Partner: No    Emotionally Abused: No    Physically Abused: No    Sexually Abused: No    FAMILY HISTORY: Family History  Problem Relation Age of Onset   Healthy Mother    Cirrhosis Father    Cancer Maternal Grandmother 35       breast   Breast cancer Maternal Grandmother     ALLERGIES:  has No Known Allergies.  MEDICATIONS:  Current Outpatient Medications  Medication Sig Dispense Refill   amLODipine (NORVASC) 10 MG tablet Take 1 tablet (10 mg total) by mouth daily. 90 tablet 3   aspirin EC 81 MG tablet Take 1 tablet (81 mg total) by mouth daily. Take daily at 6 am. Swallow whole. 90 tablet 3   benzonatate (TESSALON) 200 MG capsule Take 1 capsule (200 mg total) by mouth 2 (two) times daily as needed for cough. 30 capsule 1   cyclobenzaprine (FLEXERIL) 10 MG tablet Take 10 mg by mouth at bedtime.  ferrous sulfate 325 (65 FE) MG EC tablet Take 1 tablet (325 mg total) by mouth 2 (two) times daily. (Patient taking differently: Take 1 tablet by mouth daily with breakfast.) 60 tablet 3   gabapentin (NEURONTIN) 100 MG capsule Take 1-4 capsules (100-400 mg total) by mouth at bedtime as needed. 50 capsule 0   ibuprofen (ADVIL) 800 MG tablet Take 800 mg by mouth every 8 (eight) hours as needed. (Patient not taking: Reported on 12/14/2022)     losartan (COZAAR) 25 MG tablet Take 1 tablet (25 mg total) by mouth daily. 90 tablet 3   naloxone (NARCAN) nasal spray 4 mg/0.1 mL Place 1 spray into the nose as needed.     oxyCODONE-acetaminophen (PERCOCET) 10-325 MG tablet Take 1 tablet by mouth every 8 (eight) hours as needed for pain.     pantoprazole (PROTONIX) 40 MG tablet TAKE 1 TABLET BY MOUTH EVERY DAY 90 tablet 2   rosuvastatin (CRESTOR) 10 MG tablet Take 1 tablet (10 mg total) by mouth daily. 90 tablet 3   triamcinolone ointment (KENALOG) 0.1 % Apply 1 Application topically 2 (two) times daily. 30 g 0   Vitamin D, Ergocalciferol,  (DRISDOL) 1.25 MG (50000 UNIT) CAPS capsule Take 50,000 Units by mouth every Thursday.     No current facility-administered medications for this visit.     PHYSICAL EXAMINATION: ECOG PERFORMANCE STATUS: 0 - Asymptomatic  Vitals:   12/18/22 1356  BP: (!) 140/81  Pulse: 92  Temp: 97.9 F (36.6 C)  SpO2: 100%    NECK: No swollen lymph nodes palpated. CHEST: Lungs clear to auscultation. CARDIOVASCULAR: Heart sounds normal on auscultation. EXTREMITIES: Warm to touch  Filed Weights   12/18/22 1356  Weight: 236 lb 6.4 oz (107.2 kg)   LABORATORY DATA:  I have reviewed the data as listed Lab Results  Component Value Date   WBC 8.6 08/26/2022   HGB 11.8 (L) 08/26/2022   HCT 37.2 08/26/2022   MCV 82.3 08/26/2022   PLT 356 08/26/2022     Chemistry      Component Value Date/Time   NA 136 08/26/2022 0027   NA 138 06/16/2022 1156   K 3.5 08/26/2022 0027   CL 103 08/26/2022 0027   CO2 22 08/26/2022 0027   BUN 13 08/26/2022 0027   BUN 8 06/16/2022 1156   CREATININE 0.70 08/26/2022 0027      Component Value Date/Time   CALCIUM 8.8 (L) 08/26/2022 0027   ALKPHOS 56 08/26/2022 0027   AST 14 (L) 08/26/2022 0027   ALT 23 08/26/2022 0027   BILITOT 0.2 (L) 08/26/2022 0027   BILITOT <0.2 06/16/2022 1156     Most recent labs with iron saturation of 4%, total iron binding capacity of 4 and serum iron of 22.   RADIOGRAPHIC STUDIES: I have personally reviewed the radiological images as listed and agreed with the findings in the report. NCV with EMG(electromyography)  Result Date: 12/10/2022 Suanne Marker, MD     12/15/2022  6:11 PM  [facility] NCS (NERVE CONDUCTION STUDY) WITH EMG (ELECTROMYOGRAPHY) REPORT STUDY DATE: 12/10/22 PATIENT NAME: Connie Herrera DOB: 12-06-1975 MRN: [id number] ORDERING CLINICIAN: Joycelyn Schmid, MD TECHNOLOGIST: Jenelle Mages ELECTROMYOGRAPHER: Glenford Bayley. Penumalli, MD CLINICAL INFORMATION: 47 year old female with left arm numbness.  FINDINGS: NERVE CONDUCTION STUDY: Left median and left ulnar motor responses are normal.  Left ulnar F-wave latency is normal. Left median, left ulnar, left radial sensory responses are normal. Left median to ulnar transcarpal comparison study  is normal. NEEDLE ELECTROMYOGRAPHY: Needle examination of left upper extremity is notable for decreased recruitment in left triceps and left deltoid on exertion.  Left triceps also demonstrates large size motor units on exertion.  No abnormal spontaneous activity. IMPRESSION: This study demonstrates: -Normal nerve conduction studies. -Needle examination of left upper extremity demonstrates chronic denervation in left triceps and left deltoid, may be related to underlying cervical radiculopathies (C5-C7).  Correlate clinically. INTERPRETING PHYSICIAN: Suanne Marker, MD Certified in Neurology, Neurophysiology and Neuroimaging [facility] [address] [place], [place] [address] [phone] [facility]   Nerve / Sites Muscle Latency Ref. Amplitude Ref. Rel Amp Segments Distance Velocity Ref. Area   ms ms mV mV %  cm m/s m/s mVms L Median - APB    Wrist APB 3.0 ?4.4 4.8 ?4.0 100 Wrist - APB 7   17.7    Upper arm APB 7.8  4.5  94.3 Upper arm - Wrist 28 58 ?49 15.7 L Ulnar - ADM    Wrist ADM 2.3 ?3.3 7.8 ?6.0 100 Wrist - ADM 7   20.8    B.Elbow ADM 4.2  7.0  89.9 B.Elbow - Wrist 11 58 ?49 18.5    A.Elbow ADM 7.1  6.8  97.6 A.Elbow - B.Elbow 17 57 ?49 16.9       SNC   Nerve / Sites Rec. Site Peak Lat Ref.  Amp Ref. Segments Distance Peak Diff Ref.   ms ms V V  cm ms ms L Radial - Anatomical snuff box (Forearm)    Forearm Wrist 2.5 ?2.9 46 ?15 Forearm - Wrist 10   L Median, Ulnar - Transcarpal comparison    Median Palm Wrist 1.9 ?2.2 42 ?35 Median Palm - Wrist 8      Ulnar Palm Wrist 1.8 ?2.2 22 ?12 Ulnar Palm - Wrist 8         Median Palm - Ulnar Palm  0.2 ?0.4 L Median - Orthodromic (Dig II, Mid palm)    Dig II Wrist 2.9 ?3.4 27 ?10 Dig II - Wrist 13   L  Ulnar - Orthodromic, (Dig V, Mid palm)    Dig V Wrist 2.5 ?3.1 7 ?5 Dig V - Wrist 41             F  Wave   Nerve F Lat Ref.  ms ms L Ulnar - ADM 25.4 ?32.0     EMG Summary Table   Spontaneous MUAP Recruitment Muscle IA Fib PSW Fasc Other Amp Dur. Poly Pattern L. Triceps brachii Normal None None None _______ Increased Normal Normal Reduced L. Biceps brachii Normal None None None _______ Normal Normal Normal Normal L. Deltoid Normal None None None _______ Normal Normal Normal Reduced L. Flexor carpi radialis Normal None None None _______ Normal Normal Normal Normal L. First dorsal interosseous Normal None None None _______ Normal Normal Normal Normal L. Cervical paraspinals Normal None None None _______ Normal Normal Normal Normal    US PELVIC COMPLETE WITH TRANSVAGINAL  Result Date: 12/08/2022 CLINICAL DATA:  Menorrhagia. EXAM: TRANSABDOMINAL AND TRANSVAGINAL ULTRASOUND OF PELVIS TECHNIQUE: Both transabdominal and transvaginal ultrasound examinations of the pelvis were performed. Transabdominal technique was performed for global imaging of the pelvis including uterus, ovaries, adnexal regions, and pelvic cul-de-sac. It was necessary to proceed with endovaginal exam following the transabdominal exam to visualize the uterus and adnexa. COMPARISON:  Pelvic ultrasound dated 01/22/2020. FINDINGS: Uterus Measurements: 16.9 x 12.2 x 12.5 cm = volume: 1,345 mL. A pedunculated  mass arising from the uterine fundus measures 4.4 x 2.7 x 5.0 cm and may represent a subserosal fibroid. Additional uterine masses likely represent intramural fibroids and measure 5.1 x 4.9 x 5.2 cm in the right aspect of the uterine fundus and 7.9 x 8.0 x 8.7 cm in the right aspect of the uterine body. Endometrium A hypoechoic mass near the endometrium measures 3.3 x 2.8 x 3.9 cm and may represent a submucosal fibroid or endometrial mass. Precise measurement of the endometrium is difficult given distortion from uterine masses, however the  endometrium does not otherwise appear abnormally thickened. Right ovary Measurements: 3.9 x 2.4 x 2.8 cm = volume: 14 mL. Normal appearance/no adnexal mass. Left ovary Not visualized. Other findings No abnormal free fluid. IMPRESSION: A hypoechoic mass near the endometrium may represent a submucosal fibroid or endometrial mass. Consider further evaluation with sonohysterogram for confirmation prior to hysteroscopy. Endometrial sampling should also be considered if patient is at high risk for endometrial carcinoma. (Ref: Radiological Reasoning: Algorithmic Workup of Abnormal Vaginal Bleeding with Endovaginal Sonography and Sonohysterography. AJR 2008; 413:K44-01) Electronically Signed   By: Romona Curls M.D.   On: 12/08/2022 11:42    All questions were answered. The patient knows to call the clinic with any problems, questions or concerns. I spent 20 minutes in the care of this patient including H and P, review of records, counseling and coordination of care.     Rachel Moulds, MD 12/18/2022 2:06 PM

## 2022-12-21 ENCOUNTER — Ambulatory Visit: Payer: Medicaid Other | Admitting: Sports Medicine

## 2022-12-21 ENCOUNTER — Ambulatory Visit: Payer: Medicaid Other | Admitting: Obstetrics and Gynecology

## 2022-12-21 VITALS — BP 131/87 | HR 86 | Ht 67.0 in | Wt 235.8 lb

## 2022-12-21 DIAGNOSIS — D251 Intramural leiomyoma of uterus: Secondary | ICD-10-CM

## 2022-12-21 DIAGNOSIS — D250 Submucous leiomyoma of uterus: Secondary | ICD-10-CM

## 2022-12-21 DIAGNOSIS — D500 Iron deficiency anemia secondary to blood loss (chronic): Secondary | ICD-10-CM | POA: Diagnosis not present

## 2022-12-21 DIAGNOSIS — N920 Excessive and frequent menstruation with regular cycle: Secondary | ICD-10-CM

## 2022-12-21 DIAGNOSIS — D252 Subserosal leiomyoma of uterus: Secondary | ICD-10-CM

## 2022-12-21 NOTE — Telephone Encounter (Signed)
Requested medication (s) are due for refill today: yes  Requested medication (s) are on the active medication list: yes  Last refill:  06/16/22  Future visit scheduled: yes  Notes to clinic:  Unable to refill per protocol, cannot delegate.      Requested Prescriptions  Pending Prescriptions Disp Refills   triamcinolone ointment (KENALOG) 0.1 % [Pharmacy Med Name: TRIAMCINOLONE 0.1% OINTMENT] 30 g 0    Sig: APPLY TO AFFECTED AREA TWICE A DAY     Not Delegated - Dermatology:  Corticosteroids Failed - 12/18/2022 11:33 PM      Failed - This refill cannot be delegated      Passed - Valid encounter within last 12 months    Recent Outpatient Visits           3 months ago Left-sided weakness   [facility] [place], Shea Stakes, NP   6 months ago Encounter to establish care   [facility] [place], Shea Stakes, NP   6 months ago Primary hypertension   [facility], [place], NP   8 months ago Hospital discharge follow-up   [facility], [place], NP   8 months ago Encounter to establish care   [facility], Salomon Fick, NP       Future Appointments             Today Richardean Sale, DO [facility] [place]   In 2 days Claiborne Rigg, NP [facility]

## 2022-12-21 NOTE — Progress Notes (Signed)
CC: ultrasound follow up Subjective:    Patient ID: Connie Herrera, female    DOB: Oct 29, 1975, 47 y.o.   MRN: [id number]  HPI 47 yo G4P4 seen for follow up of uterine fibroids and treatment options.  Pt has not been seen since 3/24.  Pt had recent stroke 04/2022.  Ultrasound shows multiple medium to large sized fibroids.  Due to size the patient is not candidate for Sonata.  GnRH agonist are contraindicated in patients with CVA issues.    Review of Systems     Objective:   Physical Exam Vitals:   12/21/22 1554  BP: 131/87  Pulse: 86   CLINICAL DATA:  Menorrhagia.   EXAM: TRANSABDOMINAL AND TRANSVAGINAL ULTRASOUND OF PELVIS   TECHNIQUE: Both transabdominal and transvaginal ultrasound examinations of the pelvis were performed. Transabdominal technique was performed for global imaging of the pelvis including uterus, ovaries, adnexal regions, and pelvic cul-de-sac. It was necessary to proceed with endovaginal exam following the transabdominal exam to visualize the uterus and adnexa.   COMPARISON:  Pelvic ultrasound dated 01/22/2020.   FINDINGS: Uterus   Measurements: 16.9 x 12.2 x 12.5 cm = volume: 1,345 mL. A pedunculated mass arising from the uterine fundus measures 4.4 x 2.7 x 5.0 cm and may represent a subserosal fibroid. Additional uterine masses likely represent intramural fibroids and measure 5.1 x 4.9 x 5.2 cm in the right aspect of the uterine fundus and 7.9 x 8.0 x 8.7 cm in the right aspect of the uterine body.   Endometrium   A hypoechoic mass near the endometrium measures 3.3 x 2.8 x 3.9 cm and may represent a submucosal fibroid or endometrial mass. Precise measurement of the endometrium is difficult given distortion from uterine masses, however the endometrium does not otherwise appear abnormally thickened.   Right ovary   Measurements: 3.9 x 2.4 x 2.8 cm = volume: 14 mL. Normal appearance/no adnexal mass.   Left ovary   Not visualized.    Other findings   No abnormal free fluid.   IMPRESSION: A hypoechoic mass near the endometrium may represent a submucosal fibroid or endometrial mass. Consider further evaluation with sonohysterogram for confirmation prior to hysteroscopy. Endometrial sampling should also be considered if patient is at high risk for endometrial carcinoma. (Ref: Radiological Reasoning: Algorithmic Workup of Abnormal Vaginal Bleeding with Endovaginal Sonography and Sonohysterography. AJR 2008; 621:H08-65)        Assessment & Plan:   1. Intramural, submucous, and subserous leiomyoma of uterus At this time treatment options would include UFE if approved and hysterectomy.  Due to large size of uterus would need to consider robotic hyst/ TLH versus abdominal hyst. - Ambulatory referral to Interventional Radiology  2. Anemia due to blood loss, chronic Last hemoglobin was 11.9  3. Menorrhagia with regular cycle If UFE is not approved or ineffective, then definitive treatment by hysterectomy will be needed.  - Ambulatory referral to Interventional Radiology   F/u prn   Warden Fillers, MD Faculty Attending, [facility]

## 2022-12-23 ENCOUNTER — Encounter: Payer: Self-pay | Admitting: Nurse Practitioner

## 2022-12-23 ENCOUNTER — Ambulatory Visit: Payer: Medicaid Other | Attending: Nurse Practitioner | Admitting: Nurse Practitioner

## 2022-12-23 DIAGNOSIS — I10 Essential (primary) hypertension: Secondary | ICD-10-CM | POA: Diagnosis not present

## 2022-12-23 MED ORDER — BLOOD PRESSURE MONITOR DEVI: 0 refills | Status: AC

## 2022-12-23 MED ORDER — LOSARTAN POTASSIUM 50 MG PO TABS
50.0000 mg | ORAL_TABLET | Freq: Every day | ORAL | 1 refills | Status: AC
Start: 2022-12-23 — End: ?

## 2022-12-23 NOTE — Progress Notes (Signed)
Assessment & Plan:  [name] was seen today for medical management of chronic issues.  Diagnoses and all orders for this visit:  Hypertension, unspecified type Blood pressure not quite at goal today.  Losartan has been increased as she will continue on amlodipine as prescribed -     losartan (COZAAR) 50 MG tablet; Take 1 tablet (50 mg total) by mouth daily. -     Blood Pressure Monitor DEVI; Please provide patient with insurance approved blood pressure monitor -     CMP14+EGFR Continue all antihypertensives as prescribed.  Reminded to bring in blood pressure log for follow  up appointment.  RECOMMENDATIONS: DASH/Mediterranean Diets are healthier choices for HTN.     Patient has been counseled on age-appropriate routine health concerns for screening and prevention. These are reviewed and up-to-date. Referrals have been placed accordingly. Immunizations are up-to-date or declined.    Subjective:   Chief Complaint  Patient presents with   Medical Management of Chronic Issues   HPI Connie Herrera 47 y.o. female presents to office today for follow up to HTN She recently had an appointment with OB/GYN related to her fibroids.  Due to the complexity of her health history she was referred to interventional radiology.  Unclear if she will be a candidate for UFE or hysterectomy   HTN Blood pressure is elevated today.  She is currently taking amlodipine 10 mg daily and losartan 25 mg daily as prescribed.  She does not have a blood pressure monitoring device at home and 1 was ordered for her today. BP Readings from Last 3 Encounters:  12/23/22 (!) 140/90  12/21/22 131/87  12/18/22 (!) 140/81     Review of Systems  Constitutional:  Negative for fever, malaise/fatigue and weight loss.  HENT: Negative.  Negative for nosebleeds.   Eyes: Negative.  Negative for blurred vision, double vision and photophobia.  Respiratory: Negative.  Negative for cough and shortness of breath.    Cardiovascular: Negative.  Negative for chest pain, palpitations and leg swelling.  Gastrointestinal: Negative.  Negative for heartburn, nausea and vomiting.  Musculoskeletal: Negative.  Negative for myalgias.  Neurological: Negative.  Negative for dizziness, focal weakness, seizures and headaches.  Psychiatric/Behavioral: Negative.  Negative for suicidal ideas.     Past Medical History:  Diagnosis Date   Brain lesion    COVID-19 04/2020   CVA (cerebral vascular accident) (HCC) 04/2022   Diplopia    Hypertension    Iron deficiency anemia     Past Surgical History:  Procedure Laterality Date   AORTOGRAM N/A 04/14/2022   Procedure: DIAGNOSTIC AORTOGRAM;  Surgeon: Victorino Sparrow, MD;  Location: [facility] OR;  Service: Vascular;  Laterality: N/A;   BUBBLE STUDY  06/15/2022   Procedure: BUBBLE STUDY;  Surgeon: Maisie Fus, MD;  Location: [facility] ENDOSCOPY;  Service: Cardiovascular;;   ENDARTERECTOMY Right 04/14/2022   Procedure: CAROTID CUTDOWN WITH BRACHIOCEPHALIC STENTING, WITH ULTRASOUND OF THE RIGHT FEMORAL VEIN;  Surgeon: Victorino Sparrow, MD;  Location: [facility] OR;  Service: Vascular;  Laterality: Right;   TEE WITHOUT CARDIOVERSION N/A 06/15/2022   Procedure: TRANSESOPHAGEAL ECHOCARDIOGRAM (TEE);  Surgeon: Maisie Fus, MD;  Location: [facility] ENDOSCOPY;  Service: Cardiovascular;  Laterality: N/A;   TUBAL LIGATION      Family History  Problem Relation Age of Onset   Healthy Mother    Cirrhosis Father    Cancer Maternal Grandmother 75       breast   Breast cancer Maternal Grandmother     Social  History Reviewed with no changes to be made today.   Outpatient Medications Prior to Visit  Medication Sig Dispense Refill   amLODipine (NORVASC) 10 MG tablet Take 1 tablet (10 mg total) by mouth daily. 90 tablet 3   aspirin EC 81 MG tablet Take 1 tablet (81 mg total) by mouth daily. Take daily at 6 am. Swallow whole. 90 tablet 3   cyclobenzaprine (FLEXERIL) 10 MG tablet Take 10 mg by mouth at  bedtime.     ferrous sulfate 325 (65 FE) MG EC tablet Take 1 tablet (325 mg total) by mouth 2 (two) times daily. (Patient taking differently: Take 1 tablet by mouth daily with breakfast.) 60 tablet 3   gabapentin (NEURONTIN) 100 MG capsule Take 1-4 capsules (100-400 mg total) by mouth at bedtime as needed. 50 capsule 0   naloxone (NARCAN) nasal spray 4 mg/0.1 mL Place 1 spray into the nose as needed.     oxyCODONE-acetaminophen (PERCOCET) 10-325 MG tablet Take 1 tablet by mouth every 8 (eight) hours as needed for pain.     pantoprazole (PROTONIX) 40 MG tablet TAKE 1 TABLET BY MOUTH EVERY DAY 90 tablet 2   rosuvastatin (CRESTOR) 10 MG tablet Take 1 tablet (10 mg total) by mouth daily. 90 tablet 3   triamcinolone ointment (KENALOG) 0.1 % APPLY TO AFFECTED AREA TWICE A DAY 30 g 0   losartan (COZAAR) 25 MG tablet Take 1 tablet (25 mg total) by mouth daily. 90 tablet 3   benzonatate (TESSALON) 200 MG capsule Take 1 capsule (200 mg total) by mouth 2 (two) times daily as needed for cough. (Patient not taking: Reported on 12/23/2022) 30 capsule 1   Vitamin D, Ergocalciferol, (DRISDOL) 1.25 MG (50000 UNIT) CAPS capsule Take 50,000 Units by mouth every Thursday. (Patient not taking: Reported on 12/23/2022)     No facility-administered medications prior to visit.    No Known Allergies     Objective:    BP (!) 140/90   Pulse 78   Ht 5\' 7"  (1.702 m)   Wt 233 lb 9.6 oz (106 kg)   LMP 12/14/2022 (Approximate)   SpO2 99%   BMI 36.59 kg/m  Wt Readings from Last 3 Encounters:  12/23/22 233 lb 9.6 oz (106 kg)  12/21/22 235 lb 12.8 oz (107 kg)  12/18/22 236 lb 6.4 oz (107.2 kg)    Physical Exam Vitals and nursing note reviewed.  Constitutional:      Appearance: She is well-developed.  HENT:     Head: Normocephalic and atraumatic.  Cardiovascular:     Rate and Rhythm: Normal rate and regular rhythm.     Heart sounds: Normal heart sounds. No murmur heard.    No friction rub. No gallop.   Pulmonary:     Effort: Pulmonary effort is normal. No tachypnea or respiratory distress.     Breath sounds: Normal breath sounds. No decreased breath sounds, wheezing, rhonchi or rales.  Chest:     Chest wall: No tenderness.  Abdominal:     General: Bowel sounds are normal.     Palpations: Abdomen is soft.  Musculoskeletal:        General: Normal range of motion.     Cervical back: Normal range of motion.  Skin:    General: Skin is warm and dry.  Neurological:     Mental Status: She is alert and oriented to person, place, and time.     Coordination: Coordination normal.  Psychiatric:  Behavior: Behavior normal. Behavior is cooperative.        Thought Content: Thought content normal.        Judgment: Judgment normal.          Patient has been counseled extensively about nutrition and exercise as well as the importance of adherence with medications and regular follow-up. The patient was given clear instructions to go to ER or return to medical center if symptoms don't improve, worsen or new problems develop. The patient verbalized understanding.   Follow-up: Return in about 4 weeks (around 01/20/2023) for 4 weeks BP check with me luke or angela. see me in 3 months.   Claiborne Rigg, FNP-BC [facility] [place], [place] [phone]   12/23/2022, 1:06 PM

## 2022-12-28 ENCOUNTER — Telehealth: Payer: Self-pay | Admitting: Diagnostic Neuroimaging

## 2022-12-28 NOTE — Telephone Encounter (Signed)
[facility]: [id number] exp. 12/16/22-01/15/23 sent to GI [phone]

## 2022-12-31 ENCOUNTER — Ambulatory Visit: Payer: Medicaid Other | Attending: Nurse Practitioner

## 2023-01-01 LAB — CMP14+EGFR
ALT: 24 [IU]/L (ref 0–32)
AST: 17 [IU]/L (ref 0–40)
Albumin: 4.1 g/dL (ref 3.9–4.9)
Alkaline Phosphatase: 73 [IU]/L (ref 44–121)
BUN/Creatinine Ratio: 12 (ref 9–23)
BUN: 9 mg/dL (ref 6–24)
Bilirubin Total: 0.2 mg/dL (ref 0.0–1.2)
CO2: 23 mmol/L (ref 20–29)
Calcium: 9 mg/dL (ref 8.7–10.2)
Chloride: 102 mmol/L (ref 96–106)
Creatinine, Ser: 0.76 mg/dL (ref 0.57–1.00)
Globulin, Total: 2.5 g/dL (ref 1.5–4.5)
Glucose: 115 mg/dL — ABNORMAL HIGH (ref 70–99)
Potassium: 3.7 mmol/L (ref 3.5–5.2)
Sodium: 138 mmol/L (ref 134–144)
Total Protein: 6.6 g/dL (ref 6.0–8.5)
eGFR: 98 mL/min/{1.73_m2} (ref >59)

## 2023-01-08 ENCOUNTER — Other Ambulatory Visit: Payer: Medicaid Other

## 2023-01-11 NOTE — Therapy (Deleted)
OUTPATIENT SPEECH LANGUAGE PATHOLOGY EVALUATION   Patient Name: Connie Herrera MRN: [id number] DOB:1975-11-10, 47 y.o., female Today's Date: 01/11/2023  PCP: Claiborne Rigg, NP REFERRING PROVIDER: Claiborne Rigg, NP  END OF SESSION:   Past Medical History:  Diagnosis Date   Brain lesion    COVID-19 04/2020   CVA (cerebral vascular accident) (HCC) 04/2022   Diplopia    Hypertension    Iron deficiency anemia    Past Surgical History:  Procedure Laterality Date   AORTOGRAM N/A 04/14/2022   Procedure: DIAGNOSTIC AORTOGRAM;  Surgeon: Victorino Sparrow, MD;  Location: [facility] OR;  Service: Vascular;  Laterality: N/A;   BUBBLE STUDY  06/15/2022   Procedure: BUBBLE STUDY;  Surgeon: Maisie Fus, MD;  Location: [facility] ENDOSCOPY;  Service: Cardiovascular;;   ENDARTERECTOMY Right 04/14/2022   Procedure: CAROTID CUTDOWN WITH BRACHIOCEPHALIC STENTING, WITH ULTRASOUND OF THE RIGHT FEMORAL VEIN;  Surgeon: Victorino Sparrow, MD;  Location: [facility] OR;  Service: Vascular;  Laterality: Right;   TEE WITHOUT CARDIOVERSION N/A 06/15/2022   Procedure: TRANSESOPHAGEAL ECHOCARDIOGRAM (TEE);  Surgeon: Maisie Fus, MD;  Location: [facility] ENDOSCOPY;  Service: Cardiovascular;  Laterality: N/A;   TUBAL LIGATION     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   Left arm weakness 04/09/2022   Iron deficiency anemia 04/09/2022   Hypokalemia 04/09/2022   Muscle spasms of both lower extremities 08/14/2020   Fibroid uterus 08/14/2020   Anemia due to blood loss, chronic 01/10/2020   Menorrhagia with regular cycle 01/09/2020   Class 1 obesity due to excess calories with body mass index (BMI) of 34.0 to 34.9 in adult 01/09/2020   Screening breast examination 02/07/2019   Breast pain 02/07/2019   Hypertension 01/26/2019   Paresthesias 08/04/2013   Breast pain, right 03/11/2012    ONSET DATE: 11/13/2022    REFERRING DIAG: I63.9 (ICD-10-CM) - Acute CVA (cerebrovascular accident)  THERAPY DIAG:  No diagnosis found.  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 {OPRCPAIN:27236}  FALLS: Has patient fallen in last 6 months?  No  LIVING ENVIRONMENT: Lives with: lives alone Lives in: House/apartment  PLOF:  Level of assistance: {JYNWGNF:62130} Employment: {SLPemployment:25674}  PATIENT GOALS: ***  OBJECTIVE:   DIAGNOSTIC FINDINGS: MRI Brain 04/09/2022 IMPRESSION: 1. Multiple small areas of diffusion hyperintense signal in the occipital parietal lobes bilaterally. Some of these lesions do mild enhancement. Appearance is nonspecific but could be seen with subacute infarct. Demyelinating disease a consideration but not typical. Also consider inflammatory process such as sarcoid, vasculitis, lupus and PRES. Recommend lumbar puncture for further evaluation. 2. 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 Enhancement is somewhat atypical however is stable. No other areas of hemorrhage. 3. Mild white matter changes in the cerebral hemispheres bilaterally. This may be due to chronic microvascular ischemia. Demyelinating disease not excluded. Correlate with risk factors for small vessel disease.   MRI Brain 09/07/2022 IMPRESSION: 1. No acute intracranial process. 2. 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  COGNITION: Overall cognitive status: Impaired Areas of impairment:  {  cognitiveimpairmentslp:27409} Functional deficits: ***  COGNITIVE  COMMUNICATION: Following directions: {commands:24018}  Auditory comprehension: {WFL-Impaired:25365} Verbal expression: {WFL-Impaired:25365} Functional communication: {WFL-Impaired:25365}  ORAL MOTOR EXAMINATION: Overall status: {OMESLP2:27645} Comments: ***  STANDARDIZED ASSESSMENTS: {SLPstandardizedassessment:27092}  PATIENT REPORTED OUTCOME MEASURES (PROM): {SLPPROM:27095}   TODAY'S TREATMENT:                                                                                                                                         DATE: ***   PATIENT EDUCATION: Education details: *** Person educated: {Person educated:25204} Education method: {Education Method:25205} Education comprehension: {Education Comprehension:25206}   GOALS: Goals reviewed with patient? Yes  SHORT TERM GOALS: Target date: 01/05/2023   *** Baseline: Goal status: INITIAL  2.  *** Baseline:  Goal status: INITIAL  3.  *** Baseline:  Goal status: INITIAL  4.  *** Baseline:  Goal status: INITIAL  5.  *** Baseline:  Goal status: INITIAL  6.  *** Baseline:  Goal status: INITIAL  LONG TERM GOALS: Target date: ***  *** Baseline:  Goal status: INITIAL  2.  *** Baseline:  Goal status: INITIAL  3.  *** Baseline:  Goal status: INITIAL  4.  *** Baseline:  Goal status: INITIAL  5.  *** Baseline:  Goal status: INITIAL  6.  *** Baseline:  Goal status: INITIAL  ASSESSMENT:  CLINICAL IMPRESSION: Patient is a 47 y.o. F who was seen today for CVA in January 2024.   OBJECTIVE IMPAIRMENTS: include {SLPOBJIMP:27107}. These impairments are limiting patient from {SLPLIMIT:27108}. Factors affecting potential to achieve goals and functional outcome are {SLP factors:25450}.. Patient will benefit from skilled SLP services to address above impairments and improve overall function.  REHAB POTENTIAL: {rehabpotential:25112}  PLAN:  SLP FREQUENCY: {rehab frequency:25116}  SLP DURATION:  {rehab duration:25117}  PLANNED INTERVENTIONS: {SLP treatment/interventions:25449}    Gracy Racer, CCC-SLP 01/11/2023, 11:38 AM

## 2023-01-12 ENCOUNTER — Ambulatory Visit: Payer: Medicaid Other

## 2023-01-12 ENCOUNTER — Ambulatory Visit: Payer: Medicaid Other | Admitting: Occupational Therapy

## 2023-01-20 ENCOUNTER — Ambulatory Visit: Payer: Medicaid Other | Admitting: Nurse Practitioner

## 2023-01-21 ENCOUNTER — Encounter: Payer: Self-pay | Admitting: Pharmacist

## 2023-01-21 ENCOUNTER — Telehealth: Payer: Medicaid Other | Admitting: Physician Assistant

## 2023-01-21 ENCOUNTER — Ambulatory Visit: Payer: Medicaid Other | Attending: Nurse Practitioner | Admitting: Pharmacist

## 2023-01-21 VITALS — BP 124/81 | HR 80

## 2023-01-21 DIAGNOSIS — I10 Essential (primary) hypertension: Secondary | ICD-10-CM | POA: Diagnosis not present

## 2023-01-21 DIAGNOSIS — B9689 Other specified bacterial agents as the cause of diseases classified elsewhere: Secondary | ICD-10-CM

## 2023-01-21 MED ORDER — METRONIDAZOLE 500 MG PO TABS
500.0000 mg | ORAL_TABLET | Freq: Two times a day (BID) | ORAL | 0 refills | Status: DC
Start: 2023-01-21 — End: 2023-04-25

## 2023-01-21 NOTE — Progress Notes (Addendum)

## 2023-01-21 NOTE — Progress Notes (Signed)
   S:     No chief complaint on file.  47 y.o. female who presents for hypertension evaluation, education, and management.  PMH is significant for HTN, hx of CVA (04/2022 - workup noted a right brachiocephalic artery thrombus. Was stented and subsequently placed on Eliquis. TEE showed it was not embolic), HLD, iron deficiency anemia.   Patient was referred and last seen by Primary Care Provider, Bertram Denver, on 12/23/2022.  At that visit, BP was 140/90 mmHg. Losartan dose was increased.   Today, patient arrives in good spirits and presents without assistance. Denies dizziness, headache, blurred vision, swelling. Patient reports hypertension is longstanding.   Family/Social history:  -Fhx: no pertinent positives  -Tobacco: former (quit smoking 8-9 years ago) -Alcohol: none reported   Medication adherence reported. Patient has taken BP medications today.   Current antihypertensives include: amlodipine 10 mg daily, losartan 50 mg daily  Reported home BP readings: none  Patient reported dietary habits:  -Tries to limit sodium  -Denies drinking caffeine   Patient-reported exercise habits:  -Walks occasionally but not very active at this time   O:   Today's Vitals   01/21/23 1555  BP: 124/81  Pulse: 80   There is no height or weight on file to calculate BMI.   Last 3 Office BP readings: BP Readings from Last 3 Encounters:  12/23/22 (!) 140/90  12/21/22 131/87  12/18/22 (!) 140/81    BMET    Component Value Date/Time   NA 138 12/31/2022 1116   K 3.7 12/31/2022 1116   CL 102 12/31/2022 1116   CO2 23 12/31/2022 1116   GLUCOSE 115 (H) 12/31/2022 1116   GLUCOSE 104 (H) 08/26/2022 0027   BUN 9 12/31/2022 1116   CREATININE 0.76 12/31/2022 1116   CALCIUM 9.0 12/31/2022 1116   GFRNONAA >60 08/26/2022 0027   GFRAA 96 01/09/2020 1351    Renal function: CrCl cannot be calculated (Patient's most recent lab result is older than the maximum 21 days allowed.).  Clinical  ASCVD: Yes  The ASCVD Risk score (Arnett DK, et al., 2019) failed to calculate for the following reasons:   The patient has a prior MI or stroke diagnosis  Patient is participating in a Managed Medicaid Plan: No    A/P: Hypertension diagnosed currently at goal on current medications. BP goal < 130/80 mmHg. Medication adherence appears to be appropriate.  -Continued amlodipine 10 mg daily, losartan 50mg  daily.  -F/u labs ordered - none -Counseled on lifestyle modifications for blood pressure control including reduced dietary sodium, increased exercise, adequate sleep. -Encouraged patient to check BP at home and bring log of readings to next visit. Counseled on proper use of home BP cuff.   Results reviewed and written information provided.    Written patient instructions provided. Patient verbalized understanding of treatment plan.  Total time in face to face counseling 30 minutes.    Follow-up:  Pharmacist prn. PCP clinic visit in 03/30/2023.   Butch Penny, PharmD, Patsy Baltimore, CPP Clinical Pharmacist [facility] [phone]

## 2023-02-02 ENCOUNTER — Ambulatory Visit: Payer: Medicaid Other | Attending: Nurse Practitioner | Admitting: Occupational Therapy

## 2023-02-02 ENCOUNTER — Ambulatory Visit: Payer: Medicaid Other | Attending: Nurse Practitioner

## 2023-02-02 ENCOUNTER — Other Ambulatory Visit: Payer: Self-pay

## 2023-02-02 VITALS — BP 155/94 | HR 80

## 2023-02-02 DIAGNOSIS — M79622 Pain in left upper arm: Secondary | ICD-10-CM

## 2023-02-02 DIAGNOSIS — R1310 Dysphagia, unspecified: Secondary | ICD-10-CM | POA: Insufficient documentation

## 2023-02-02 DIAGNOSIS — R41841 Cognitive communication deficit: Secondary | ICD-10-CM | POA: Insufficient documentation

## 2023-02-02 NOTE — Therapy (Signed)
OUTPATIENT OCCUPATIONAL THERAPY Arrived, No Charge  Patient Name: Connie Herrera MRN: [id number] DOB:1975-04-09, 47 y.o., female Today's Date: 02/02/2023  PCP: Claiborne Rigg, NP  REFERRING PROVIDER: Richardean Sale, DO   END OF SESSION:  OT End of Session - 02/02/23 1522     Visit Number 0    Authorization Type [employer]    OT Start Time 1447    OT Stop Time 1500    OT Time Calculation (min) 13 min    Activity Tolerance Treatment limited secondary to medical complications (Comment)   high BP            Past Medical History:  Diagnosis Date   Brain lesion    COVID-19 04/2020   CVA (cerebral vascular accident) (HCC) 04/2022   Diplopia    Hypertension    Iron deficiency anemia    Past Surgical History:  Procedure Laterality Date   AORTOGRAM N/A 04/14/2022   Procedure: DIAGNOSTIC AORTOGRAM;  Surgeon: Victorino Sparrow, MD;  Location: [facility] OR;  Service: Vascular;  Laterality: N/A;   BUBBLE STUDY  06/15/2022   Procedure: BUBBLE STUDY;  Surgeon: Maisie Fus, MD;  Location: [facility] ENDOSCOPY;  Service: Cardiovascular;;   ENDARTERECTOMY Right 04/14/2022   Procedure: CAROTID CUTDOWN WITH BRACHIOCEPHALIC STENTING, WITH ULTRASOUND OF THE RIGHT FEMORAL VEIN;  Surgeon: Victorino Sparrow, MD;  Location: [facility] OR;  Service: Vascular;  Laterality: Right;   TEE WITHOUT CARDIOVERSION N/A 06/15/2022   Procedure: TRANSESOPHAGEAL ECHOCARDIOGRAM (TEE);  Surgeon: Maisie Fus, MD;  Location: [facility] ENDOSCOPY;  Service: Cardiovascular;  Laterality: N/A;   TUBAL LIGATION     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   Left arm weakness 04/09/2022   Iron deficiency anemia 04/09/2022   Hypokalemia 04/09/2022   Muscle spasms of both lower extremities 08/14/2020   Fibroid uterus 08/14/2020   Anemia due to blood loss, chronic 01/10/2020   Menorrhagia with regular cycle 01/09/2020    Class 1 obesity due to excess calories with body mass index (BMI) of 34.0 to 34.9 in adult 01/09/2020   Screening breast examination 02/07/2019   Breast pain 02/07/2019   Hypertension 01/26/2019   Paresthesias 08/04/2013   Breast pain, right 03/11/2012    ONSET DATE: 12/14/22 (referral date)  REFERRING DIAG: M79.602 (ICD-10-CM) - Left arm pain   THERAPY DIAG:  Pain in left upper arm  Rationale for Evaluation and Treatment: Rehabilitation  SUBJECTIVE:   SUBJECTIVE STATEMENT: Pt prefers to be called "[name]"  Pt reports often feeling confused since stroke and LUE shakes and hurts. Pt reports frequently getting dizzy when standing up and more in the afternoon. Pt denies dizziness currently. Pt reported waking up at 4 AM today for work.  Pt accompanied by: self  PERTINENT HISTORY: PMH: HTN, diplopia, anemia, CVA 04/2022.  Pt previously evaluated for OT on 12/14/22. D/t previous OT evaluation completed more than 30 days ago, new OT eval completed 02/02/23.  1. Left arm weakness 2. Left arm pain 3. 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  Can titrate up to gabapentin 400 mg nightly trying to find lowest effective dose  PRECAUTIONS: None.   WEIGHT BEARING RESTRICTIONS: No   PAIN:  Are you having pain? Yes: NPRS scale: 8/10  Pain location: L arm Pain description: aching Aggravating factors: working Relieving factors: muscle relaxer  FALLS: Has patient fallen in last 6 months? No.   LIVING ENVIRONMENT: Lives with: lives alone Lives in: House/apartment Stairs: Yes: External: 6 steps; on right going up Has following equipment at home: Quad cane small base and Grab bars  PLOF: Independent   PATIENT GOALS: TBD  OBJECTIVE:  Note: Objective measures were completed at Evaluation  unless otherwise noted.  HAND DOMINANCE: Right   ADLs:  Overall ADLs: TBD Transfers/ambulation related to ADLs: TBD Eating: TBD Grooming: TBD UB Dressing: TBD LB Dressing: TBD Bathing: TBD Tub Shower transfers: TBD Equipment: TBD   IADLs:             Driving: TBD Shopping: TBD Light housekeeping: TBD Meal Prep: TBD Community mobility: TBD Medication management: TBD Financial management: TBD Handwriting:  TBD  MOBILITY STATUS:  TBD  POSTURE COMMENTS:  02/02/23 - pt leaning heavily to R side TBD  ACTIVITY TOLERANCE: Activity tolerance: TBD  FUNCTIONAL OUTCOME MEASURES: TBD  UPPER EXTREMITY ROM:     TBD    ROM Right eval Left eval  Shoulder flexion    Shoulder abduction    Shoulder adduction    Shoulder extension    Shoulder internal rotation    Shoulder external rotation    Elbow flexion    Elbow extension    Wrist flexion    Wrist extension    Wrist ulnar deviation    Wrist radial deviation    Wrist pronation    Wrist supination    (Blank rows = not tested)  UPPER EXTREMITY MMT:     TBD   MMT Right eval Left eval  Shoulder flexion    Shoulder abduction    Shoulder adduction    Shoulder extension    Shoulder internal rotation    Shoulder external rotation    Middle trapezius    Lower trapezius    Elbow flexion    Elbow extension    Wrist flexion    Wrist extension    Wrist ulnar deviation    Wrist radial deviation    Wrist pronation    Wrist supination    (Blank rows = not tested)  HAND FUNCTION: Grip strength: Right: TBD lbs; Left: TBD lbs  COORDINATION: 9 Hole Peg test: Right: TBD; Left: TBD Box and Blocks:  Right TBD, Left TBD  SENSATION: TBD   EDEMA: TBD  MUSCLE TONE: TBD  COGNITION: Overall cognitive status: TBD  VISION: TBD Per 12/14/22 previous OT eval:  Subjective report: History of diplopia - would cover L eye Baseline vision: No visual deficits Visual history: Changes after the stroke (dx list includes  diplopia)  Update: TBD  VISION ASSESSMENT: TBD  PERCEPTION: Not tested  PRAXIS: Not tested  OBSERVATIONS:  TBD - see today's treatment below   TODAY'S TREATMENT:  DATE:   Pt arrived for OT eval today, no charge. Pt ambulated ind. Pt began participating in subjective reporting during eval though OT noted pt began leaning heavily to R side, required increased need to repeat verbal instructions, and appeared lethargic as evidenced by slowly blinking eyes and looking somewhat dazed. Pt denied dizziness. OT checked pt's BP:  BP: 155/94, pulse 80 at 2:47 PM.  Pt then reported she had forgotten to take her BP medication today. Pt declined to have BP taken again. D/t BP outside therapeutic parameters and pt symptomatic though denying dizziness, OT eval ended. Therefore, remaining subjective and objective measures were unable to be assessed today. OT recommended pt call someone for a ride home though pt declined stating "I drove in like this" and continued to deny dizziness. OT recommended for pt to f/u with physician if symptoms continue and to rest d/t waking up early this A.M. Pt confirmed understanding. Pt reported intent to take BP medication when she arrived home and to call back to re-schedule OT eval. OT recommended pt to f/u with physician to ask about missed doses of medication PRN. Pt acknowledged understanding of all.  PATIENT EDUCATION: Education details: BP parameters, dizziness symptoms when standing, decreasing fall risk when standing up (e.g. keep chair or have stable surface nearby), OT eval reschedule Person educated: Patient Education method: Explanation Education comprehension: verbalized understanding  HOME EXERCISE PROGRAM: TBD   GOALS: Goals reviewed with patient? No  SHORT TERM GOALS: Target date: TBD  TBD Baseline:  Goal status:  INITIAL   2.  TBD Baseline:  Goal status: INITIAL   3. TBD Baseline:  Goal status: INITIAL     LONG TERM GOALS: Target date: TBD   TBD Baseline: Goal status: INITIAL   2.  TBD Baseline:  Goal status: INITIAL   3.  TBD Baseline:   Goal status: INITIAL   ASSESSMENT:  CLINICAL IMPRESSION: Pt unable to be assessed today d/t high BP. OT evaluation will be re-scheduled at later date if pt's BP is within therapeutic parameters.  PERFORMANCE DEFICITS: in functional skills including  TBD , cognitive skills including  TBD , and psychosocial skills including  TBD .   IMPAIRMENTS: are limiting patient from  TBD .   CO-MORBIDITIES: may have co-morbidities  that affects occupational performance. Patient will benefit from skilled OT to address above impairments and improve overall function.  MODIFICATION OR ASSISTANCE TO COMPLETE EVALUATION: TBD  OT OCCUPATIONAL PROFILE AND HISTORY: TBD  CLINICAL DECISION MAKING: TBD  REHAB POTENTIAL: TBD  EVALUATION COMPLEXITY: TBD    PLAN:  OT FREQUENCY: TBD  OT DURATION: TBD  PLANNED INTERVENTIONS: TBD  RECOMMENDED OTHER SERVICES: TBD PRN  CONSULTED AND AGREED WITH PLAN OF CARE: TBD  PLAN FOR NEXT SESSION:  Reschedule OT eval   Wynetta Emery, OT 02/02/2023, 3:31 PM

## 2023-02-02 NOTE — Therapy (Signed)
OUTPATIENT SPEECH LANGUAGE PATHOLOGY EVALUATION   Patient Name: Connie Herrera MRN: [id number] DOB:1975/05/25, 47 y.o., female Today's Date: 02/02/2023  PCP: Claiborne Rigg, NP REFERRING PROVIDER: Claiborne Rigg, NP  END OF SESSION:  End of Session - 02/02/23 1420     Visit Number 1    Number of Visits 7    Date for SLP Re-Evaluation 03/16/23    Authorization Type [employer]    Authorization Time Period 2 visits used this year    Authorization - Number of Visits 27    SLP Start Time 1405    SLP Stop Time  1445    SLP Time Calculation (min) 40 min    Activity Tolerance Patient tolerated treatment well;Other (comment)   tearful            Past Medical History:  Diagnosis Date   Brain lesion    COVID-19 04/2020   CVA (cerebral vascular accident) (HCC) 04/2022   Diplopia    Hypertension    Iron deficiency anemia    Past Surgical History:  Procedure Laterality Date   AORTOGRAM N/A 04/14/2022   Procedure: DIAGNOSTIC AORTOGRAM;  Surgeon: Victorino Sparrow, MD;  Location: [facility] OR;  Service: Vascular;  Laterality: N/A;   BUBBLE STUDY  06/15/2022   Procedure: BUBBLE STUDY;  Surgeon: Maisie Fus, MD;  Location: [facility] ENDOSCOPY;  Service: Cardiovascular;;   ENDARTERECTOMY Right 04/14/2022   Procedure: CAROTID CUTDOWN WITH BRACHIOCEPHALIC STENTING, WITH ULTRASOUND OF THE RIGHT FEMORAL VEIN;  Surgeon: Victorino Sparrow, MD;  Location: [facility] OR;  Service: Vascular;  Laterality: Right;   TEE WITHOUT CARDIOVERSION N/A 06/15/2022   Procedure: TRANSESOPHAGEAL ECHOCARDIOGRAM (TEE);  Surgeon: Maisie Fus, MD;  Location: [facility] ENDOSCOPY;  Service: Cardiovascular;  Laterality: N/A;   TUBAL LIGATION     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   Left arm weakness 04/09/2022   Iron deficiency anemia 04/09/2022   Hypokalemia 04/09/2022   Muscle spasms of both lower  extremities 08/14/2020   Fibroid uterus 08/14/2020   Anemia due to blood loss, chronic 01/10/2020   Menorrhagia with regular cycle 01/09/2020   Class 1 obesity due to excess calories with body mass index (BMI) of 34.0 to 34.9 in adult 01/09/2020   Screening breast examination 02/07/2019   Breast pain 02/07/2019   Hypertension 01/26/2019   Paresthesias 08/04/2013   Breast pain, right 03/11/2012    ONSET DATE: 11/13/2022 (referral)  REFERRING DIAG: I63.9 (ICD-10-CM) - Acute CVA (cerebrovascular accident)  THERAPY DIAG:  Cognitive communication deficit  Dysphagia, unspecified type  Rationale for Evaluation and Treatment: Rehabilitation  SUBJECTIVE:   SUBJECTIVE STATEMENT: "My memory is shot"  Pt accompanied by: self  PERTINENT HISTORY: HTN, CVA 04/2022, 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 Yes: NPRS scale: 8/10 Pain location: left arm Pain description: constant Relieving factors: muscle relaxer  FALLS: Has patient fallen in last 6 months?  No  LIVING ENVIRONMENT: Lives with: lives alone Lives in: House/apartment  PLOF:  Level of assistance: Independent with ADLs, Independent with IADLs Employment: Part-time employment - cook  PATIENT GOALS: "memory"  OBJECTIVE:   DIAGNOSTIC FINDINGS: MRI Brain 04/09/2022 IMPRESSION: 1. Multiple small areas of diffusion hyperintense signal in the occipital parietal lobes bilaterally. Some of these lesions do mild enhancement. Appearance is nonspecific but could be seen with  subacute infarct. Demyelinating disease a consideration but not typical. Also consider inflammatory process such as sarcoid, vasculitis, lupus and PRES. Recommend lumbar puncture for further evaluation. 2. 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 This is most likely an area of chronic hemorrhage due to cavernoma however  chronic hemorrhage due to hypertension or vasculitis possible. Enhancement is somewhat atypical however is stable. No other areas of hemorrhage. 3. Mild white matter changes in the cerebral hemispheres bilaterally. This may be due to chronic microvascular ischemia. Demyelinating disease not excluded. Correlate with risk factors for small vessel disease.   MRI Brain 09/07/2022 IMPRESSION: 1. No acute intracranial process. 2. 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  COGNITION: Overall cognitive status: Impaired Areas of impairment:  Attention: Impaired: Sustained, Selective, Alternating, Divided Memory: Impaired: Working Teacher, music term Prospective Awareness: Impaired: Emergent and Anticipatory Executive function: Impaired: Initiation, Problem solving, Organization, Planning, and Slow processing Behavior: Reduced motivation reported Functional deficits: Usual memory mistakes reported, including forgetting to pay water bill, forgetting items at work, forgot meds x1 following schedule change  COGNITIVE COMMUNICATION: Following directions: Follows one step commands consistently  Auditory comprehension: WFL Verbal expression: Impaired: Pt endorsed changes in speech, including word retrieval and slurred speech. No overt errors noted today but will continue to monitor   ORAL MOTOR EXAMINATION: Overall status: WFL  STANDARDIZED ASSESSMENTS: Portions of CLQT completed: Memory Subtest=Moderate  PATIENT REPORTED OUTCOME MEASURES (PROM): Not completed d/t time constraints   TODAY'S TREATMENT:                                                                                                                                         02-02-23: Eval only   PATIENT EDUCATION: Education details: POC Person educated: Patient Education method: Explanation Education comprehension: verbalized understanding and needs further education   GOALS: Goals reviewed with patient? Yes  LONG TERM  GOALS: Target date: 03/16/2023 (STG=LTG)  Pt will attend 4/4 ST sessions with use of trained memory supports  Baseline: forgetting appointments Goal status: INITIAL  2.  Pt will utilize memory compensations to manage iADLs without error >1 week  Baseline: iADL memory mistakes  Goal status: INITIAL  3.  Pt will carryover speech strategies in unstructured conversations x2 given rare min A  Baseline: speech concerns reported Goal status: INITIAL  4.  Pt will complete MBSS to evaluate aspiration risk  Baseline: swallow concerns Goal status: INITIAL  5.  Pt will carryover swallow recommendations with mod I >1 week  Baseline: swallow concerns  Goal status: INITIAL  ASSESSMENT:  CLINICAL IMPRESSION: Patient is a 47 y.o. F who was seen today for CVA in January 2024. Endorses significant changes since CVA, including significantly impaired recall, intermittent word finding, and choking "all the time." Memory mistakes include forgetting tasks at part-time job (cook), forgetting to pay bills resulting in water getting shut off, missing appointments,  and rarely forgetting medications. Overtly frustrated about cognitive changes, with endorsement as area of most concern. Reported frequent choking with eating, drinking, and saliva since stroke. Recommend MBSS to further evaluate aspiration risk. No overt speech errors noted today, other than reduced volume occasionally impacting clarity, but will continue to monitor and address as needed. Pt would benefit from skilled ST intervention to optimize cognitive communication and swallowing to maximize safety and return to baseline.   OBJECTIVE IMPAIRMENTS: include attention, memory, executive functioning, expressive language, and dysphagia. These impairments are limiting patient from return to work, managing medications, managing appointments, managing finances, household responsibilities, effectively communicating at home and in community, and safety when  swallowing. Factors affecting potential to achieve goals and functional outcome are ability to learn/carryover information, co-morbidities, cooperation/participation level, and family/community support.. Patient will benefit from skilled SLP services to address above impairments and improve overall function.  REHAB POTENTIAL: Fair hx of missed apts   PLAN:  SLP FREQUENCY: 1x/week  SLP DURATION: 6 weeks  PLANNED INTERVENTIONS: [phone]- [address] Artic, Phon, Eval Compre, Express, [id number] Treatment of swallowing function, [id number] Treatment of speech (30 or 45 min) , Re-evaluation, Aspiration precaution training, Language facilitation, Environmental controls, Cueing hierachy, Cognitive reorganization, Internal/external aids, Oral motor exercises, Functional tasks, Multimodal communication approach, SLP instruction and feedback, Compensatory strategies, and Patient/family education  Check all possible CPT codes: 06237 - SLP treatment and [phone] - Swallowing treatment    Check all conditions that are expected to impact treatment: Cognitive Impairment or Intellectual disability, Psychological or psychiatric disorders, and Social determinants of health   If treatment provided at initial evaluation, no treatment charged due to lack of authorization.     Gracy Racer, CCC-SLP 02/02/2023, 3:09 PM

## 2023-02-10 ENCOUNTER — Other Ambulatory Visit (HOSPITAL_COMMUNITY): Payer: Self-pay | Admitting: Nurse Practitioner

## 2023-02-10 DIAGNOSIS — R1310 Dysphagia, unspecified: Secondary | ICD-10-CM

## 2023-02-12 ENCOUNTER — Telehealth: Payer: Self-pay | Admitting: Nurse Practitioner

## 2023-02-12 NOTE — Telephone Encounter (Signed)
Copied from CRM [id number]. Topic: Referral - Status >> Feb 12, 2023 12:39 PM Macon Large wrote: Reason for CRM: Pt stated that she had a female OBGYN but requests that a referral for a female OBGYN be sent.

## 2023-02-14 NOTE — Telephone Encounter (Signed)
There are female doctors there. Can she not just schedule with one????

## 2023-02-15 ENCOUNTER — Encounter: Payer: Self-pay | Admitting: Nurse Practitioner

## 2023-02-15 NOTE — Telephone Encounter (Signed)
I will also send a mychart message.

## 2023-02-16 ENCOUNTER — Encounter: Payer: Self-pay | Admitting: Nurse Practitioner

## 2023-02-16 ENCOUNTER — Ambulatory Visit: Payer: Medicaid Other | Attending: Nurse Practitioner | Admitting: Nurse Practitioner

## 2023-02-16 VITALS — BP 136/84 | HR 80 | Ht 67.0 in | Wt 235.6 lb

## 2023-02-16 DIAGNOSIS — D250 Submucous leiomyoma of uterus: Secondary | ICD-10-CM | POA: Diagnosis not present

## 2023-02-16 DIAGNOSIS — D369 Benign neoplasm, unspecified site: Secondary | ICD-10-CM | POA: Diagnosis not present

## 2023-02-16 NOTE — Progress Notes (Addendum)
Assessment & Plan:  Connie Herrera was seen today for cyst.  Diagnoses and all orders for this visit:  Submucous leiomyoma of uterus -     Ambulatory referral to Obstetrics / Gynecology  Dermoid cyst    Patient has been counseled on age-appropriate routine health concerns for screening and prevention. These are reviewed and up-to-date. Referrals have been placed accordingly. Immunizations are up-to-date or declined.    Subjective:   Chief Complaint  Patient presents with  . Cyst    Right side of upper jaw     Connie Herrera 47 y.o. female presents to office today requesting a referral for second opinion to fibroid removal. She also has a dermoid cyst of her right cheek that needs to be excised. She already has an upcoming appointment with dermatology so they can see her for this as well. She verbalized understanding.    GU PER GYN NOTES . Menorrhagia with regular cycle Discussed treatment options including myfembree, uterine fibroid embolization or hysterectomy.   Ultrasound shows multiple medium to large sized fibroids. Due to size the patient is not candidate for Sonata. GnRH agonist are contraindicated in patients with CVA issues.  Due to large size of uterus would need to consider robotic hyst/ TLH versus abdominal hyst. - Ambulatory referral to Interventional Radiology    Review of Systems  Constitutional:  Negative for fever, malaise/fatigue and weight loss.  HENT: Negative.  Negative for nosebleeds.   Eyes: Negative.  Negative for blurred vision, double vision and photophobia.  Respiratory: Negative.  Negative for cough and shortness of breath.   Cardiovascular: Negative.  Negative for chest pain, palpitations and leg swelling.  Gastrointestinal: Negative.  Negative for heartburn, nausea and vomiting.  Musculoskeletal: Negative.  Negative for myalgias.  Skin:        Dermoid cyst  Neurological: Negative.  Negative for dizziness, focal weakness, seizures and  headaches.  Psychiatric/Behavioral: Negative.  Negative for suicidal ideas.     Past Medical History:  Diagnosis Date  . Brain lesion   . COVID-19 04/2020  . CVA (cerebral vascular accident) (HCC) 04/2022  . Diplopia   . Hypertension   . Iron deficiency anemia     Past Surgical History:  Procedure Laterality Date  . AORTOGRAM N/A 04/14/2022   Procedure: DIAGNOSTIC AORTOGRAM;  Surgeon: Victorino Sparrow, MD;  Location: [facility] OR;  Service: Vascular;  Laterality: N/A;  . BUBBLE STUDY  06/15/2022   Procedure: BUBBLE STUDY;  Surgeon: Maisie Fus, MD;  Location: [facility] ENDOSCOPY;  Service: Cardiovascular;;  . ENDARTERECTOMY Right 04/14/2022   Procedure: CAROTID CUTDOWN WITH BRACHIOCEPHALIC STENTING, WITH ULTRASOUND OF THE RIGHT FEMORAL VEIN;  Surgeon: Victorino Sparrow, MD;  Location[facility];  Service: Vascular;  Laterality: Right;  . TEE WITHOUT CARDIOVERSION N/A 06/15/2022   Procedure: TRANSESOPHAGEAL ECHOCARDIOGRAM (TEE);  Surgeon: Maisie Fus, MD;  Location: [facility] ENDOSCOPY;  Service: Cardiovascular;  Laterality: N/A;  . TUBAL LIGATION      Family History  Problem Relation Age of Onset  . Healthy Mother   . Cirrhosis Father   . Cancer Maternal Grandmother 2       breast  . Breast cancer Maternal Grandmother     Social History Reviewed with no changes to be made today.   Outpatient Medications Prior to Visit  Medication Sig Dispense Refill  . amLODipine (NORVASC) 10 MG tablet Take 1 tablet (10 mg total) by mouth daily. 90 tablet 3  . aspirin EC 81 MG tablet Take 1 tablet (  81 mg total) by mouth daily. Take daily at 6 am. Swallow whole. 90 tablet 3  . Blood Pressure Monitor DEVI Please provide patient with insurance approved blood pressure monitor 1 each 0  . cyclobenzaprine (FLEXERIL) 10 MG tablet Take 10 mg by mouth at bedtime.    . ferrous sulfate 325 (65 FE) MG EC tablet Take 1 tablet (325 mg total) by mouth 2 (two) times daily. (Patient taking differently: Take 1 tablet by mouth daily  with breakfast.) 60 tablet 3  . gabapentin (NEURONTIN) 100 MG capsule Take 1-4 capsules (100-400 mg total) by mouth at bedtime as needed. 50 capsule 0  . losartan (COZAAR) 50 MG tablet Take 1 tablet (50 mg total) by mouth daily. 90 tablet 1  . metroNIDAZOLE (FLAGYL) 500 MG tablet Take 1 tablet (500 mg total) by mouth 2 (two) times daily. 14 tablet 0  . naloxone (NARCAN) nasal spray 4 mg/0.1 mL Place 1 spray into the nose as needed.    Marland Kitchen oxyCODONE-acetaminophen (PERCOCET) 10-325 MG tablet Take 1 tablet by mouth every 8 (eight) hours as needed for pain.    . pantoprazole (PROTONIX) 40 MG tablet TAKE 1 TABLET BY MOUTH EVERY DAY 90 tablet 2  . rosuvastatin (CRESTOR) 10 MG tablet Take 1 tablet (10 mg total) by mouth daily. 90 tablet 3  . triamcinolone ointment (KENALOG) 0.1 % APPLY TO AFFECTED AREA TWICE A DAY 30 g 0  . benzonatate (TESSALON) 200 MG capsule Take 1 capsule (200 mg total) by mouth 2 (two) times daily as needed for cough. (Patient not taking: Reported on 12/23/2022) 30 capsule 1  . Vitamin D, Ergocalciferol, (DRISDOL) 1.25 MG (50000 UNIT) CAPS capsule Take 50,000 Units by mouth every Thursday. (Patient not taking: Reported on 12/23/2022)     No facility-administered medications prior to visit.    No Known Allergies     Objective:    BP 136/84 (BP Location: Left Arm, Patient Position: Sitting, Cuff Size: Normal)   Pulse 80   Ht 5\' 7"  (1.702 m)   Wt 235 lb 9.6 oz (106.9 kg)   LMP 02/11/2023 (Exact Date)   SpO2 98%   BMI 36.90 kg/m  Wt Readings from Last 3 Encounters:  02/16/23 235 lb 9.6 oz (106.9 kg)  12/23/22 233 lb 9.6 oz (106 kg)  12/21/22 235 lb 12.8 oz (107 kg)    Physical Exam Vitals and nursing note reviewed.  Constitutional:      Appearance: She is well-developed.  HENT:     Head: Normocephalic and atraumatic.   Cardiovascular:     Rate and Rhythm: Normal rate and regular rhythm.     Heart sounds: Normal heart sounds. No murmur heard.    No friction rub. No  gallop.  Pulmonary:     Effort: Pulmonary effort is normal. No tachypnea or respiratory distress.     Breath sounds: Normal breath sounds. No decreased breath sounds, wheezing, rhonchi or rales.  Chest:     Chest wall: No tenderness.  Abdominal:     General: Bowel sounds are normal.     Palpations: Abdomen is soft.  Musculoskeletal:        General: Normal range of motion.     Cervical back: Normal range of motion.  Skin:    General: Skin is warm and dry.  Neurological:     Mental Status: She is alert and oriented to person, place, and time.     Coordination: Coordination normal.  Psychiatric:  Behavior: Behavior normal. Behavior is cooperative.        Thought Content: Thought content normal.        Judgment: Judgment normal.         Patient has been counseled extensively about nutrition and exercise as well as the importance of adherence with medications and regular follow-up. The patient was given clear instructions to go to ER or return to medical center if symptoms don't improve, worsen or new problems develop. The patient verbalized understanding.   Follow-up: Return if symptoms worsen or fail to improve.   Claiborne Rigg, FNP-BC [facility] [place], [place] [phone]   02/16/2023, 3:18 PM

## 2023-02-20 ENCOUNTER — Telehealth: Payer: Medicaid Other | Admitting: Nurse Practitioner

## 2023-02-20 DIAGNOSIS — B009 Herpesviral infection, unspecified: Secondary | ICD-10-CM | POA: Diagnosis not present

## 2023-02-20 MED ORDER — ACYCLOVIR 400 MG PO TABS
400.0000 mg | ORAL_TABLET | Freq: Three times a day (TID) | ORAL | 1 refills | Status: DC
Start: 2023-02-20 — End: 2023-04-11

## 2023-02-20 NOTE — Progress Notes (Signed)
E-Visit for Herpes Simplex  We are sorry that you are not feeling well.  Here is how we plan to help!  Based on what you have shared ith me, it looks like you may be having an outbreak/flare-up of genital herpes.    I have prescribed I have prescribed Acyclovir 400 mg Take on by mouth three times daily for 5 days.    If you have been prescribed long term medications to be taken on a regular basis, it is important to follow the recommendations and take them as ordered.    Outbreaks usually include blisters and open sores in the genital area. Outbreaks that happen after the first time are usually not as severe and do not last as long. Genital Herpes Simplex is a commonly sexually transmitted viral infection that is found worldwide. Most of these genital infections are caused by one or two herpes simplex viruses that is passed from person to person during vaginal, oral, or anal sex. Sometimes, people do not know they have herpes because they do not have any symptoms.  Please be aware that if you have genital herpes you can be contagious even when you are not having rash or flare-up and you may not have any symptoms, even when you are taking suppressive medicines.  Herpes cannot be cured. The disease usually causes most problems during the first few years. After that, the virus is still there, but it causes few to no symptoms. Even when the virus is active, people with herpes can take medicines to reduce and help prevent symptoms.  Herpes is an infection that can cause blisters and open sores on the genital area. Herpes is caused by a virus that is passed from person to person during vaginal, oral, or anal sex. Sometimes, people do not know they have herpes because they do not have any symptoms. Herpes cannot be cured. The disease usually causes most problems during the first few years. After that, the virus is still there, but it causes few to no symptoms. Even when the virus is active, people with herpes  can take medicines to reduce and help prevent symptoms.  If you have been prescribed medications to be taken on a regular basis, it is important to follow the recommendations and take them as ordered.  Some people with herpes never have any symptoms. But other people can develop symptoms within a few weeks of being infected with the herpes virus   Symptoms usually include blisters in the genital area. In women, this area includes the vagina, buttocks, anus, or thighs. In men, this area includes the penis, scrotum, anus, butt, or thighs. The blisters can become painful open sores, which then crust over as they heal. Sometimes, people can have other symptoms that include:  ?Blisters on the mouth or lips ?Fever, headache, or pain in the joints ?Trouble urinating  Outbreaks might occur every month or more often, or just once or twice a year. Sometimes, people can tell when an outbreak will occur, because they feel itching or pain beforehand. Sometimes they do not know that an outbreak is coming because they have no symptoms. Whatever your pattern is, keep in mind that herpes outbreaks usually become less frequent over time as you get older. Certain things, called "triggers," can make outbreaks more likely to occur. These include stress, sunlight, menstrual periods,or getting sick.  Antiviral therapy can shorten the duration of symptoms and signs in primary infection, which, when untreated, can be associated with significant increase in the   symptoms of the disease.  HOME CARE Use a portable bath (such as a "Sitz bath") where you can sit in warm water for about 20 minutes. Your bathtub could also work. Avoid bubble baths.  Keep the genital area clean and dry and avoid tight clothes.  Take over-the-counter pain medicine such as acetaminophen (brand name: Tylenol) or ibuprofen sample brand names: Advil, Motrin). But avoid aspirin.  Only take medications as instructed by your medical team.  You are  most likely to spread herpes to a sex partner when you have blisters and open sores on your body. But it's also possible to spread herpes to your partner when you do not have any symptoms. That is because herpes can be present on your body without causing any symptoms, like blisters or pain.  Telling your sex partner that you have herpes can be hard. But it can help protect them, since there are ways to lower the risk of spreading the infection.   Using a condom every time you have sex  Not having sex when you have symptoms  Not having oral sex if you have blisters or open sores (in the genital area or around your mouth)  MAKE SURE YOU   Understand these instructions. Do not have sex without using a condom until you have been seen by a doctor and as instructed by the provider If you are not better or improved within 7 days, you MUST have a follow up at your doctor or the health department for evaluation. There are other causes of rashes in the genital region.  Thank you for choosing an e-visit.  Your e-visit answers were reviewed by a board certified advanced clinical practitioner to complete your personal care plan. Depending upon the condition, your plan could have included both over the counter or prescription medications.  Please review your pharmacy choice. Make sure the pharmacy is open so you can pick up prescription now. If there is a problem, you may contact your provider through MyChart messaging and have the prescription routed to another pharmacy.  Your safety is important to us. If you have drug allergies check your prescription carefully.   For the next 24 hours you can use MyChart to ask questions about today's visit, request a non-urgent call back, or ask for a work or school excuse. You will get an email in the next two days asking about your experience. I hope that your e-visit has been valuable and will speed your recovery.      

## 2023-02-20 NOTE — Progress Notes (Signed)
I have spent 5 minutes in review of e-visit questionnaire, review and updating patient chart, medical decision making and response to patient.  ° °Jerrell Mangel W Secilia Apps, NP ° °  °

## 2023-02-24 ENCOUNTER — Encounter (HOSPITAL_COMMUNITY): Payer: Medicaid Other

## 2023-02-25 ENCOUNTER — Ambulatory Visit (HOSPITAL_COMMUNITY)
Admission: RE | Admit: 2023-02-25 | Discharge: 2023-02-25 | Disposition: A | Discharge status: Home / Self Care | Payer: Medicaid Other | Source: Ambulatory Visit | Attending: *Deleted | Admitting: *Deleted

## 2023-02-25 ENCOUNTER — Ambulatory Visit (HOSPITAL_COMMUNITY): Admission: RE | Admit: 2023-02-25 | Payer: Medicaid Other | Source: Ambulatory Visit

## 2023-02-25 DIAGNOSIS — R1310 Dysphagia, unspecified: Secondary | ICD-10-CM

## 2023-02-25 DIAGNOSIS — R41841 Cognitive communication deficit: Secondary | ICD-10-CM

## 2023-03-01 ENCOUNTER — Other Ambulatory Visit: Payer: Self-pay | Admitting: Nurse Practitioner

## 2023-03-01 DIAGNOSIS — Z8673 Personal history of transient ischemic attack (TIA), and cerebral infarction without residual deficits: Secondary | ICD-10-CM

## 2023-03-16 ENCOUNTER — Encounter: Payer: Self-pay | Admitting: Diagnostic Neuroimaging

## 2023-03-16 DIAGNOSIS — Z0271 Encounter for disability determination: Secondary | ICD-10-CM

## 2023-03-18 ENCOUNTER — Encounter (HOSPITAL_COMMUNITY): Payer: Medicaid Other

## 2023-03-18 ENCOUNTER — Ambulatory Visit (HOSPITAL_COMMUNITY): Admission: RE | Admit: 2023-03-18 | Payer: Medicaid Other | Source: Ambulatory Visit

## 2023-03-19 ENCOUNTER — Other Ambulatory Visit: Payer: Medicaid Other

## 2023-03-29 ENCOUNTER — Other Ambulatory Visit (HOSPITAL_COMMUNITY): Payer: Self-pay

## 2023-03-29 MED ORDER — OXYCODONE-ACETAMINOPHEN 10-325 MG PO TABS
1.0000 | ORAL_TABLET | Freq: Two times a day (BID) | ORAL | 0 refills | Status: DC | PRN
  Filled 2023-03-29: qty 14, 7d supply, fill #0

## 2023-03-30 ENCOUNTER — Ambulatory Visit: Payer: Medicaid Other | Admitting: Nurse Practitioner

## 2023-04-04 ENCOUNTER — Other Ambulatory Visit: Payer: Self-pay | Admitting: Nurse Practitioner

## 2023-04-04 DIAGNOSIS — B009 Herpesviral infection, unspecified: Secondary | ICD-10-CM

## 2023-04-05 ENCOUNTER — Other Ambulatory Visit: Payer: Self-pay | Admitting: Nurse Practitioner

## 2023-04-05 ENCOUNTER — Ambulatory Visit: Payer: Medicaid Other | Admitting: Nurse Practitioner

## 2023-04-05 ENCOUNTER — Other Ambulatory Visit (HOSPITAL_COMMUNITY): Payer: Self-pay

## 2023-04-05 DIAGNOSIS — K219 Gastro-esophageal reflux disease without esophagitis: Secondary | ICD-10-CM

## 2023-04-05 MED ORDER — OXYCODONE-ACETAMINOPHEN 10-325 MG PO TABS
1.0000 | ORAL_TABLET | Freq: Two times a day (BID) | ORAL | 0 refills | Status: DC | PRN
  Filled 2023-04-05: qty 60, 30d supply, fill #0

## 2023-04-05 NOTE — Telephone Encounter (Signed)
Medication Refill -  Most Recent Primary Care Visit:  Provider: Bertram Denver W  Department: [facility]  Visit Type: OFFICE VISIT  Date: 02/16/2023  Medication: pantoprazole (PROTONIX) 40 MG tablet and Acyclovir 400mg   Has the patient contacted their pharmacy? Yes Patient lost her medication pantoprazole and pharmacy stated provider would need to change the dosage for insurance to cover as the refill is too soon  Is this the correct pharmacy for this prescription? Yes  This is the patient's preferred pharmacy:  [facility] Phone: [phone]  Fax: [phone]      Has the prescription been filled recently? Yes  Is the patient out of the medication? Yes  Has the patient been seen for an appointment in the last year OR does the patient have an upcoming appointment? Yes  Can we respond through MyChart? No  Agent: Please be advised that Rx refills may take up to 3 business days. We ask that you follow-up with your pharmacy.  Acyclovir 400mg  not on patients current med list

## 2023-04-09 ENCOUNTER — Other Ambulatory Visit (HOSPITAL_COMMUNITY): Payer: Self-pay

## 2023-04-09 MED ORDER — PANTOPRAZOLE SODIUM 40 MG PO TBEC
40.0000 mg | DELAYED_RELEASE_TABLET | Freq: Every day | ORAL | 2 refills | Status: DC
  Filled 2023-04-09 – 2023-04-14 (×2): qty 90, 90d supply, fill #0

## 2023-04-09 NOTE — Telephone Encounter (Signed)
 Requested medications are due for refill today.  No - see note from pt  Requested medications are on the active medications list.  yes  Last refill. 08/17/2022 #90 2 rf  Future visit scheduled.   yes  Notes to clinic.  Patient lost her medication pantoprazole  and pharmacy stated provider would need to change the dosage for insurance to cover as the refill is too soon    Requested Prescriptions  Pending Prescriptions Disp Refills   pantoprazole  (PROTONIX ) 40 MG tablet 90 tablet 2    Sig: Take 1 tablet (40 mg total) by mouth daily.     Gastroenterology: Proton Pump Inhibitors Passed - 04/09/2023 11:38 AM      Passed - Valid encounter within last 12 months    Recent Outpatient Visits           1 month ago Submucous leiomyoma of uterus   [place] [facility] - A Dept Of [place]. [facility] Theotis Haze ORN, NP   2 months ago Primary hypertension   [place] [facility] [place] - A Dept Of [place]. [facility] Fleeta Tonia Garnette LITTIE, RPH-CPP   3 months ago Primary hypertension   [place] [facility] [place] - A Dept Of [place]. [facility] Theotis Haze ORN, NP   6 months ago Left-sided weakness   [place] [facility] [place] - A Dept Of [place]. [facility] Theotis Haze ORN, NP   9 months ago Encounter to establish care   [place] [facility] [place] - A Dept Of . [facility] Theotis Haze ORN, NP       Future Appointments             In 1 month Theotis Haze ORN, NP [facility] [place] - A Dept Of . [facility]

## 2023-04-14 ENCOUNTER — Other Ambulatory Visit (HOSPITAL_COMMUNITY): Payer: Self-pay

## 2023-04-22 ENCOUNTER — Ambulatory Visit (HOSPITAL_COMMUNITY): Admission: RE | Admit: 2023-04-22 | Payer: Medicaid Other | Source: Ambulatory Visit

## 2023-04-22 ENCOUNTER — Ambulatory Visit (HOSPITAL_COMMUNITY)
Admission: RE | Admit: 2023-04-22 | Discharge: 2023-04-22 | Disposition: A | Discharge status: Home / Self Care | Payer: Medicaid Other | Source: Ambulatory Visit | Attending: Nurse Practitioner | Admitting: Nurse Practitioner

## 2023-04-22 DIAGNOSIS — Z8673 Personal history of transient ischemic attack (TIA), and cerebral infarction without residual deficits: Secondary | ICD-10-CM

## 2023-04-25 ENCOUNTER — Telehealth: Payer: Medicaid Other | Admitting: Physician Assistant

## 2023-04-25 ENCOUNTER — Encounter: Payer: Self-pay | Admitting: Physician Assistant

## 2023-04-25 DIAGNOSIS — N760 Acute vaginitis: Secondary | ICD-10-CM

## 2023-04-25 DIAGNOSIS — B9689 Other specified bacterial agents as the cause of diseases classified elsewhere: Secondary | ICD-10-CM | POA: Diagnosis not present

## 2023-04-25 MED ORDER — METRONIDAZOLE 500 MG PO TABS
500.0000 mg | ORAL_TABLET | Freq: Two times a day (BID) | ORAL | 0 refills | Status: AC
  Filled 2023-04-25: qty 14, 7d supply, fill #0

## 2023-04-25 NOTE — Progress Notes (Signed)
E-Visit for Vaginal Symptoms  We are sorry that you are not feeling well. Here is how we plan to help! Based on what you shared with me it looks like you: May have a vaginosis due to bacteria  Vaginosis is an inflammation of the vagina that can result in discharge, itching and pain. The cause is usually a change in the normal balance of vaginal bacteria or an infection. Vaginosis can also result from reduced estrogen levels after menopause.  The most common causes of vaginosis are:   Bacterial vaginosis which results from an overgrowth of one on several organisms that are normally present in your vagina.   Yeast infections which are caused by a naturally occurring fungus called candida.   Vaginal atrophy (atrophic vaginosis) which results from the thinning of the vagina from reduced estrogen levels after menopause.   Trichomoniasis which is caused by a parasite and is commonly transmitted by sexual intercourse.  Factors that increase your risk of developing vaginosis include: Medications, such as antibiotics and steroids Uncontrolled diabetes Use of hygiene products such as bubble bath, vaginal spray or vaginal deodorant Douching Wearing damp or tight-fitting clothing Using an intrauterine device (IUD) for birth control Hormonal changes, such as those associated with pregnancy, birth control pills or menopause Sexual activity Having a sexually transmitted infection  Your treatment plan is Metronidazole or Flagyl 500mg twice a day for 7 days.  I have electronically sent this prescription into the pharmacy that you have chosen.  Be sure to take all of the medication as directed. Stop taking any medication if you develop a rash, tongue swelling or shortness of breath. Mothers who are breast feeding should consider pumping and discarding their breast milk while on these antibiotics. However, there is no consensus that infant exposure at these doses would be harmful.  Remember that  medication creams can weaken latex condoms. .   HOME CARE:  Good hygiene may prevent some types of vaginosis from recurring and may relieve some symptoms:  Avoid baths, hot tubs and whirlpool spas. Rinse soap from your outer genital area after a shower, and dry the area well to prevent irritation. Don't use scented or harsh soaps, such as those with deodorant or antibacterial action. Avoid irritants. These include scented tampons and pads. Wipe from front to back after using the toilet. Doing so avoids spreading fecal bacteria to your vagina.  Other things that may help prevent vaginosis include:  Don't douche. Your vagina doesn't require cleansing other than normal bathing. Repetitive douching disrupts the normal organisms that reside in the vagina and can actually increase your risk of vaginal infection. Douching won't clear up a vaginal infection. Use a latex condom. Both female and female latex condoms may help you avoid infections spread by sexual contact. Wear cotton underwear. Also wear pantyhose with a cotton crotch. If you feel comfortable without it, skip wearing underwear to bed. Yeast thrives in moist environments Your symptoms should improve in the next day or two.  GET HELP RIGHT AWAY IF:  You have pain in your lower abdomen ( pelvic area or over your ovaries) You develop nausea or vomiting You develop a fever Your discharge changes or worsens You have persistent pain with intercourse You develop shortness of breath, a rapid pulse, or you faint.  These symptoms could be signs of problems or infections that need to be evaluated by a medical provider now.  MAKE SURE YOU   Understand these instructions. Will watch your condition. Will get help right   away if you are not doing well or get worse.  Thank you for choosing an e-visit.  Your e-visit answers were reviewed by a board certified advanced clinical practitioner to complete your personal care plan. Depending upon the  condition, your plan could have included both over the counter or prescription medications.  Please review your pharmacy choice. Make sure the pharmacy is open so you can pick up prescription now. If there is a problem, you may contact your provider through [employer] and have the prescription routed to another pharmacy.  Your safety is important to [place]. If you have drug allergies check your prescription carefully.   For the next 24 hours you can use MyChart to ask questions about today's visit, request a non-urgent call back, or ask for a work or school excuse. You will get an email in the next two days asking about your experience. I hope that your e-visit has been valuable and will speed your recovery. I have spent 5 minutes in review of e-visit questionnaire, review and updating patient chart, medical decision making and response to patient.   Loraine Grip Mayers, PA-C

## 2023-04-26 ENCOUNTER — Other Ambulatory Visit (HOSPITAL_COMMUNITY): Payer: Self-pay

## 2023-05-03 ENCOUNTER — Other Ambulatory Visit (HOSPITAL_COMMUNITY): Payer: Self-pay

## 2023-05-03 MED ORDER — OXYCODONE-ACETAMINOPHEN 10-325 MG PO TABS
1.0000 | ORAL_TABLET | Freq: Two times a day (BID) | ORAL | 0 refills | Status: DC | PRN
  Filled 2023-05-03: qty 60, 30d supply, fill #0

## 2023-05-04 ENCOUNTER — Other Ambulatory Visit: Payer: Self-pay | Admitting: Interventional Radiology

## 2023-05-04 ENCOUNTER — Other Ambulatory Visit: Payer: Self-pay | Admitting: Nurse Practitioner

## 2023-05-04 ENCOUNTER — Encounter: Payer: Self-pay | Admitting: Interventional Radiology

## 2023-05-04 DIAGNOSIS — D259 Leiomyoma of uterus, unspecified: Secondary | ICD-10-CM

## 2023-05-04 DIAGNOSIS — N920 Excessive and frequent menstruation with regular cycle: Secondary | ICD-10-CM

## 2023-05-06 ENCOUNTER — Ambulatory Visit (HOSPITAL_COMMUNITY)
Admission: RE | Admit: 2023-05-06 | Discharge: 2023-05-06 | Disposition: A | Discharge status: Home / Self Care | Payer: Medicaid Other | Source: Ambulatory Visit | Attending: Interventional Radiology | Admitting: Interventional Radiology

## 2023-05-06 DIAGNOSIS — N920 Excessive and frequent menstruation with regular cycle: Secondary | ICD-10-CM | POA: Insufficient documentation

## 2023-05-06 DIAGNOSIS — D259 Leiomyoma of uterus, unspecified: Secondary | ICD-10-CM | POA: Insufficient documentation

## 2023-05-06 MED ORDER — GADOBUTROL 1 MMOL/ML IV SOLN
10.0000 mL | Freq: Once | INTRAVENOUS | Status: AC | PRN
  Administered 2023-05-06: 10 mL via INTRAVENOUS

## 2023-05-12 NOTE — Therapy (Signed)
 [facility] [address] [place], [place], [address] Phone: [phone]   Fax:  [phone]  Patient Details  Name: Connie Herrera MRN: [id number] Date of Birth: February 05, 1976 Referring Provider:  No ref. provider found  Encounter Date: 05/12/2023  SPEECH THERAPY DISCHARGE SUMMARY  Visits from Start of Care: 1  Current functional level related to goals / functional outcomes: Unknown as pt did not return after eval   Remaining deficits: Unknown   Education / Equipment: POC, recommendations   Patient agrees to discharge. Patient goals were not met. Patient is being discharged due to not returning since the last visit.SABRA Comer LILLETTE Vicci, CCC-SLP 05/12/2023, 10:09 AM  [facility] [address] [place], [place], [address] Phone: [phone]   Fax:  [phone]

## 2023-05-13 ENCOUNTER — Inpatient Hospital Stay: Admission: RE | Admit: 2023-05-13 | Payer: Medicaid Other | Source: Ambulatory Visit

## 2023-05-15 ENCOUNTER — Other Ambulatory Visit: Payer: Self-pay | Admitting: Nurse Practitioner

## 2023-05-15 DIAGNOSIS — K219 Gastro-esophageal reflux disease without esophagitis: Secondary | ICD-10-CM

## 2023-05-17 ENCOUNTER — Encounter: Payer: Self-pay | Admitting: Nurse Practitioner

## 2023-05-17 ENCOUNTER — Ambulatory Visit: Payer: Medicaid Other | Attending: Nurse Practitioner | Admitting: Nurse Practitioner

## 2023-05-17 VITALS — BP 124/81 | HR 88 | Resp 19 | Ht 67.0 in | Wt 237.6 lb

## 2023-05-17 DIAGNOSIS — I10 Essential (primary) hypertension: Secondary | ICD-10-CM

## 2023-05-17 DIAGNOSIS — E559 Vitamin D deficiency, unspecified: Secondary | ICD-10-CM | POA: Diagnosis not present

## 2023-05-17 DIAGNOSIS — D500 Iron deficiency anemia secondary to blood loss (chronic): Secondary | ICD-10-CM

## 2023-05-17 NOTE — Telephone Encounter (Signed)
 Change in pharmacy Requested Prescriptions  Pending Prescriptions Disp Refills   pantoprazole  (PROTONIX ) 40 MG tablet [Pharmacy Med Name: PANTOPRAZOLE  SOD DR 40 MG TAB] 90 tablet 2    Sig: TAKE 1 TABLET BY MOUTH EVERY DAY     Gastroenterology: Proton Pump Inhibitors Passed - 05/17/2023 12:31 PM      Passed - Valid encounter within last 12 months    Recent Outpatient Visits           3 months ago Submucous leiomyoma of uterus   [place] [facility] [place] - A Dept Of [place]. [facility] Collins Dean, NP   3 months ago Primary hypertension   [place] [facility] [place] - A Dept Of [place]. [facility] Freada Jacobs, Jonathon Neighbors, RPH-CPP   4 months ago Primary hypertension   [place] [facility] [place] - A Dept Of [place]. [facility] Collins Dean, NP   7 months ago Left-sided weakness   [place] [facility] [place] - A Dept Of [place]. [facility] Collins Dean, NP   11 months ago Encounter to establish care   [place] [facility] [place] - A Dept Of [place]. [facility] Collins Dean, NP       Future Appointments             Today Collins Dean, NP [place] [facility] [place] [place] - A Dept Of [place]. [facility]

## 2023-05-17 NOTE — Progress Notes (Signed)
 Assessment & Plan:  Connie Herrera was seen today for hypertension.  Diagnoses and all orders for this visit:  Primary hypertension -     CMP14+EGFR Continue all antihypertensives as prescribed.  Reminded to bring in blood pressure log for follow  up appointment.  RECOMMENDATIONS: DASH/Mediterranean Diets are healthier choices for HTN.    Vitamin D  deficiency disease -     VITAMIN D  25 Hydroxy (Vit-D Deficiency, Fractures)  Iron deficiency anemia due to chronic blood loss -     CBC with Differential -     Iron, TIBC and Ferritin Panel    Patient has been counseled on age-appropriate routine health concerns for screening and prevention. These are reviewed and up-to-date. Referrals have been placed accordingly. Immunizations are up-to-date or declined.    Subjective:   Chief Complaint  Patient presents with   Hypertension    Connie Herrera 48 y.o. female presents to office today for follow up to HTN  She has an upcoming appointment with IR. She has uterine fibroids that are contributing to her chronic blood loss anemia. Has history of stroke/thrombus. She is concerned she may have another stroke. Has questions I can not answer for her and I have deferred her to IR next week to discuss.    HTN Blood pressure is well controlled. She is taking amlodipine  10 mg daily and losartan  50 mg daily.  BP Readings from Last 3 Encounters:  05/17/23 124/81  02/16/23 136/84  02/02/23 (!) 155/94     Review of Systems  Constitutional:  Negative for fever, malaise/fatigue and weight loss.  HENT: Negative.  Negative for nosebleeds.   Eyes: Negative.  Negative for blurred vision, double vision and photophobia.  Respiratory: Negative.  Negative for cough and shortness of breath.   Cardiovascular: Negative.  Negative for chest pain, palpitations and leg swelling.  Gastrointestinal: Negative.  Negative for heartburn, nausea and vomiting.  Musculoskeletal: Negative.  Negative for myalgias.   Neurological: Negative.  Negative for dizziness, focal weakness, seizures and headaches.  Psychiatric/Behavioral: Negative.  Negative for suicidal ideas.     Past Medical History:  Diagnosis Date   Brain lesion    COVID-19 04/2020   CVA (cerebral vascular accident) (HCC) 04/2022   Diplopia    Hypertension    Iron deficiency anemia     Past Surgical History:  Procedure Laterality Date   AORTOGRAM N/A 04/14/2022   Procedure: DIAGNOSTIC AORTOGRAM;  Surgeon: Kayla Part, MD;  Location: [facility] OR;  Service: Vascular;  Laterality: N/A;   BUBBLE STUDY  06/15/2022   Procedure: BUBBLE STUDY;  Surgeon: Bridgette Campus, MD;  Location[facility] ENDOSCOPY;  Service: Cardiovascular;;   ENDARTERECTOMY Right 04/14/2022   Procedure: CAROTID CUTDOWN WITH BRACHIOCEPHALIC STENTING, WITH ULTRASOUND OF THE RIGHT FEMORAL VEIN;  Surgeon: Kayla Part, MD;  Location: [facility] OR;  Service: Vascular;  Laterality: Right;   TEE WITHOUT CARDIOVERSION N/A 06/15/2022   Procedure: TRANSESOPHAGEAL ECHOCARDIOGRAM (TEE);  Surgeon: Bridgette Campus, MD;  Location: [facility] ENDOSCOPY;  Service: Cardiovascular;  Laterality: N/A;   TUBAL LIGATION      Family History  Problem Relation Age of Onset   Healthy Mother    Cirrhosis Father    Cancer Maternal Grandmother 77       breast   Breast cancer Maternal Grandmother     Social History Reviewed with no changes to be made today.   Outpatient Medications Prior to Visit  Medication Sig Dispense Refill   acyclovir  (ZOVIRAX ) 400 MG tablet TAKE  1 TABLET BY MOUTH 3 TIMES DAILY FOR 5 DAYS. 15 tablet 1   amLODipine  (NORVASC ) 10 MG tablet Take 1 tablet (10 mg total) by mouth daily. 90 tablet 3   aspirin  EC 81 MG tablet Take 1 tablet (81 mg total) by mouth daily. Take daily at 6 am. Swallow whole. 90 tablet 3   Blood Pressure Monitor DEVI Please provide patient with insurance approved blood pressure monitor 1 each 0   cyclobenzaprine  (FLEXERIL ) 10 MG tablet Take 10 mg by mouth at bedtime.      ferrous sulfate  325 (65 FE) MG EC tablet Take 1 tablet (325 mg total) by mouth 2 (two) times daily. (Patient taking differently: Take 1 tablet by mouth daily with breakfast.) 60 tablet 3   gabapentin  (NEURONTIN ) 100 MG capsule Take 1-4 capsules (100-400 mg total) by mouth at bedtime as needed. 50 capsule 0   losartan  (COZAAR ) 50 MG tablet Take 1 tablet (50 mg total) by mouth daily. 90 tablet 1   naloxone  (NARCAN ) nasal spray 4 mg/0.1 mL Place 1 spray into the nose as needed.     oxyCODONE -acetaminophen  (PERCOCET) 10-325 MG tablet Take 1 tablet by mouth 2 (two) times daily as needed. 60 tablet 0   pantoprazole  (PROTONIX ) 40 MG tablet TAKE 1 TABLET BY MOUTH EVERY DAY 90 tablet 1   rosuvastatin  (CRESTOR ) 10 MG tablet Take 1 tablet (10 mg total) by mouth daily. 90 tablet 3   triamcinolone  ointment (KENALOG ) 0.1 % APPLY TO AFFECTED AREA TWICE A DAY 30 g 0   benzonatate  (TESSALON ) 200 MG capsule Take 1 capsule (200 mg total) by mouth 2 (two) times daily as needed for cough. (Patient not taking: Reported on 05/17/2023) 30 capsule 1   Vitamin D , Ergocalciferol , (DRISDOL ) 1.25 MG (50000 UNIT) CAPS capsule Take 50,000 Units by mouth every Thursday. (Patient not taking: Reported on 12/23/2022)     oxyCODONE -acetaminophen  (PERCOCET) 10-325 MG tablet Take 1 tablet by mouth every 8 (eight) hours as needed for pain. (Patient not taking: Reported on 05/17/2023)     oxyCODONE -acetaminophen  (PERCOCET) 10-325 MG tablet Take 1 tablet by mouth 2 (two) times daily as needed. (Patient not taking: Reported on 05/17/2023) 14 tablet 0   No facility-administered medications prior to visit.    No Known Allergies     Objective:    BP 124/81 (BP Location: Left Arm, Patient Position: Sitting, Cuff Size: Normal)   Pulse 88   Resp 19   Ht 5\' 7"  (1.702 m)   Wt 237 lb 9.6 oz (107.8 kg)   LMP 05/05/2023   SpO2 100%   BMI 37.21 kg/m  Wt Readings from Last 3 Encounters:  05/17/23 237 lb 9.6 oz (107.8 kg)  02/16/23 235 lb 9.6  oz (106.9 kg)  12/23/22 233 lb 9.6 oz (106 kg)    Physical Exam Vitals and nursing note reviewed.  Constitutional:      Appearance: She is well-developed.  HENT:     Head: Normocephalic and atraumatic.  Cardiovascular:     Rate and Rhythm: Normal rate and regular rhythm.     Heart sounds: Normal heart sounds. No murmur heard.    No friction rub. No gallop.  Pulmonary:     Effort: Pulmonary effort is normal. No tachypnea or respiratory distress.     Breath sounds: Normal breath sounds. No decreased breath sounds, wheezing, rhonchi or rales.  Chest:     Chest wall: No tenderness.  Abdominal:     General: Bowel sounds are normal.  Palpations: Abdomen is soft.  Musculoskeletal:        General: Normal range of motion.     Cervical back: Normal range of motion.  Skin:    General: Skin is warm and dry.  Neurological:     Mental Status: She is alert and oriented to person, place, and time.     Coordination: Coordination normal.  Psychiatric:        Behavior: Behavior normal. Behavior is cooperative.        Thought Content: Thought content normal.        Judgment: Judgment normal.          Patient has been counseled extensively about nutrition and exercise as well as the importance of adherence with medications and regular follow-up. The patient was given clear instructions to go to ER or return to medical center if symptoms don't improve, worsen or new problems develop. The patient verbalized understanding.   Follow-up: No follow-ups on file.   Collins Dean, FNP-BC [facility] [place], [place] [phone]   05/17/2023, 8:27 PM

## 2023-05-18 LAB — CBC WITH DIFFERENTIAL/PLATELET
Basophils Absolute: 0 10*3/uL (ref 0.0–0.2)
Basos: 0 %
EOS (ABSOLUTE): 0.1 10*3/uL (ref 0.0–0.4)
Eos: 2 %
Hematocrit: 38.9 % (ref 34.0–46.6)
Hemoglobin: 12.7 g/dL (ref 11.1–15.9)
Immature Grans (Abs): 0 10*3/uL (ref 0.0–0.1)
Immature Granulocytes: 0 %
Lymphocytes Absolute: 2.1 10*3/uL (ref 0.7–3.1)
Lymphs: 29 %
MCH: 28.4 pg (ref 26.6–33.0)
MCHC: 32.6 g/dL (ref 31.5–35.7)
MCV: 87 fL (ref 79–97)
Monocytes Absolute: 0.6 10*3/uL (ref 0.1–0.9)
Monocytes: 8 %
Neutrophils Absolute: 4.5 10*3/uL (ref 1.4–7.0)
Neutrophils: 61 %
Platelets: 434 10*3/uL (ref 150–450)
RBC: 4.47 x10E6/uL (ref 3.77–5.28)
RDW: 13 % (ref 11.7–15.4)
WBC: 7.3 10*3/uL (ref 3.4–10.8)

## 2023-05-18 LAB — CMP14+EGFR
ALT: 22 [IU]/L (ref 0–32)
AST: 14 [IU]/L (ref 0–40)
Albumin: 3.9 g/dL (ref 3.9–4.9)
Alkaline Phosphatase: 79 [IU]/L (ref 44–121)
BUN/Creatinine Ratio: 15 (ref 9–23)
BUN: 10 mg/dL (ref 6–24)
Bilirubin Total: <0.2 mg/dL (ref 0.0–1.2)
CO2: 24 mmol/L (ref 20–29)
Calcium: 9.3 mg/dL (ref 8.7–10.2)
Chloride: 102 mmol/L (ref 96–106)
Creatinine, Ser: 0.67 mg/dL (ref 0.57–1.00)
Globulin, Total: 3 g/dL (ref 1.5–4.5)
Glucose: 120 mg/dL — ABNORMAL HIGH (ref 70–99)
Potassium: 3.6 mmol/L (ref 3.5–5.2)
Sodium: 141 mmol/L (ref 134–144)
Total Protein: 6.9 g/dL (ref 6.0–8.5)
eGFR: 108 mL/min/{1.73_m2} (ref >59)

## 2023-05-18 LAB — IRON,TIBC AND FERRITIN PANEL
Ferritin: 31 ng/mL (ref 15–150)
Iron Saturation: 18 % (ref 15–55)
Iron: 59 ug/dL (ref 27–159)
Total Iron Binding Capacity: 332 ug/dL (ref 250–450)
UIBC: 273 ug/dL (ref 131–425)

## 2023-05-18 LAB — VITAMIN D 25 HYDROXY (VIT D DEFICIENCY, FRACTURES): Vit D, 25-Hydroxy: 20.6 ng/mL — ABNORMAL LOW (ref 30.0–100.0)

## 2023-05-20 ENCOUNTER — Ambulatory Visit
Admission: RE | Admit: 2023-05-20 | Discharge: 2023-05-20 | Disposition: A | Discharge status: Home / Self Care | Payer: Medicaid Other | Source: Ambulatory Visit | Attending: Nurse Practitioner | Admitting: Nurse Practitioner

## 2023-05-20 ENCOUNTER — Other Ambulatory Visit: Payer: Self-pay | Admitting: Interventional Radiology

## 2023-05-20 DIAGNOSIS — D259 Leiomyoma of uterus, unspecified: Secondary | ICD-10-CM

## 2023-05-20 DIAGNOSIS — N920 Excessive and frequent menstruation with regular cycle: Secondary | ICD-10-CM

## 2023-05-20 HISTORY — PX: IR RADIOLOGIST EVAL & MGMT: IMG5224

## 2023-05-20 NOTE — Consult Note (Signed)
Chief Complaint: Patient was seen in consultation today for uterine fibroids at the request of Yesika Rispoli K  Referring Physician(s): Ulas Zuercher K  History of Present Illness: Connie Herrera is a 48 y.o. female (G4, P4) with a long history of bulky and highly symptomatic uterine fibroids.  She was initially diagnosed with fibroids approximately 8 to 10 years ago.  Unfortunately, she did not seek treatment until her symptoms have become quite severe.  She is quick scratch that her cycles are fairly regular although they have been occurring at shorter intervals.  She requires use of superabsorbent pads and must change them every 2 hours.  She is mildly anemic, her most recent hemoglobin was 12.7.  She is taking iron supplementation.  Her most recent Pap smear in 2024 was negative.  MRI demonstrates numerous large bulky uterine fibroids.  Unfortunately, she had a stroke earlier this year.  While she has largely recovered, she has been told that she is not a candidate for general anesthesia and surgery.  Given the severity of her symptoms, her physician was hopeful that she might be a candidate for uterine artery embolization as a minimally invasive method to help her with her severe symptoms.  Past Medical History:  Diagnosis Date   Brain lesion    COVID-19 04/2020   CVA (cerebral vascular accident) (HCC) 04/2022   Diplopia    Hypertension    Iron deficiency anemia     Past Surgical History:  Procedure Laterality Date   AORTOGRAM N/A 04/14/2022   Procedure: DIAGNOSTIC AORTOGRAM;  Surgeon: Victorino Sparrow, MD;  Location: [facility] OR;  Service: Vascular;  Laterality: N/A;   BUBBLE STUDY  06/15/2022   Procedure: BUBBLE STUDY;  Surgeon: Maisie Fus, MD;  Location: [facility] ENDOSCOPY;  Service: Cardiovascular;;   ENDARTERECTOMY Right 04/14/2022   Procedure: CAROTID CUTDOWN WITH BRACHIOCEPHALIC STENTING, WITH ULTRASOUND OF THE RIGHT FEMORAL VEIN;  Surgeon: Victorino Sparrow, MD;   Location: [facility] OR;  Service: Vascular;  Laterality: Right;   IR RADIOLOGIST EVAL & MGMT  05/20/2023   TEE WITHOUT CARDIOVERSION N/A 06/15/2022   Procedure: TRANSESOPHAGEAL ECHOCARDIOGRAM (TEE);  Surgeon: Maisie Fus, MD;  Location: [facility] ENDOSCOPY;  Service: Cardiovascular;  Laterality: N/A;   TUBAL LIGATION      Allergies: Patient has no known allergies.  Medications: Prior to Admission medications   Medication Sig Start Date End Date Taking? Authorizing Provider  acyclovir (ZOVIRAX) 400 MG tablet TAKE 1 TABLET BY MOUTH 3 TIMES DAILY FOR 5 DAYS. 04/11/23   Claiborne Rigg, NP  amLODipine (NORVASC) 10 MG tablet Take 1 tablet (10 mg total) by mouth daily. 05/19/22   Maisie Fus, MD  aspirin EC 81 MG tablet Take 1 tablet (81 mg total) by mouth daily. Take daily at 6 am. Swallow whole. 06/16/22   Claiborne Rigg, NP  Blood Pressure Monitor DEVI Please provide patient with insurance approved blood pressure monitor 12/23/22   Claiborne Rigg, NP  cyclobenzaprine (FLEXERIL) 10 MG tablet Take 10 mg by mouth at bedtime. 06/05/22   [provider]  ferrous sulfate 325 (65 FE) MG EC tablet Take 1 tablet (325 mg total) by mouth 2 (two) times daily. Patient taking differently: Take 1 tablet by mouth daily with breakfast. 01/08/22   Rachel Moulds, MD  gabapentin (NEURONTIN) 100 MG capsule Take 1-4 capsules (100-400 mg total) by mouth at bedtime as needed. 11/27/22   Richardean Sale, DO  losartan (COZAAR) 50 MG tablet Take 1 tablet (50  mg total) by mouth daily. 12/23/22   Claiborne Rigg, NP  naloxone [facility]) nasal spray 4 mg/0.1 mL Place 1 spray into the nose as needed. 04/30/22   [provider]  oxyCODONE-acetaminophen (PERCOCET) 10-325 MG tablet Take 1 tablet by mouth 2 (two) times daily as needed. 05/03/23     pantoprazole (PROTONIX) 40 MG tablet TAKE 1 TABLET BY MOUTH EVERY DAY 05/17/23   Claiborne Rigg, NP  rosuvastatin (CRESTOR) 10 MG tablet Take 1 tablet (10 mg total) by mouth  daily. 06/16/22   Claiborne Rigg, NP  triamcinolone ointment (KENALOG) 0.1 % APPLY TO AFFECTED AREA TWICE A DAY 12/21/22   Hoy Register, MD  Vitamin D, Ergocalciferol, (DRISDOL) 1.25 MG (50000 UNIT) CAPS capsule Take 50,000 Units by mouth every Thursday. Patient not taking: Reported on 12/23/2022 05/07/22   [provider]  hydrochlorothiazide (HYDRODIURIL) 25 MG tablet Take 1 tablet (25 mg total) by mouth daily. Must have office visit for refills 06/20/20 08/13/20  Waldon Merl, PA-C     Family History  Problem Relation Age of Onset   Healthy Mother    Cirrhosis Father    Cancer Maternal Grandmother 79       breast   Breast cancer Maternal Grandmother     Social History   Socioeconomic History   Marital status: Single    Spouse name: Not on file   Number of children: 4   Years of education: 14   Highest education level: Some college, no degree  Occupational History   Occupation: Dietary Supervisor    Comment: [employer]  Tobacco Use   Smoking status: Former    Current packs/day: 0.00    Average packs/day: 0.5 packs/day for 4.0 years (2.0 ttl pk-yrs)    Types: Cigarettes    Start date: 06/04/2013    Quit date: 06/04/2017    Years since quitting: 5.9    Passive exposure: Past   Smokeless tobacco: Never  Vaping Use   Vaping status: Never Used  Substance and Sexual Activity   Alcohol use: No   Drug use: Yes    Types: Marijuana    Comment: 09/30/20 maybe once a week   Sexual activity: Yes    Birth control/protection: Surgical  Other Topics Concern   Not on file  Social History Narrative   Patient lives at home with children.    Patient has 4 children.    Patient is single.    Patient has 2 years of college.    Patient is right handed.    Patient is working full time. [employer].    Social Drivers of Corporate investment banker Strain: Not on file  Food Insecurity: No Food Insecurity (04/10/2022)   Hunger Vital Sign    Worried About Running  Out of Food in the Last Year: Never true    Ran Out of Food in the Last Year: Never true  Transportation Needs: No Transportation Needs (04/10/2022)   PRAPARE - Administrator, Civil Service (Medical): No    Lack of Transportation (Non-Medical): No  Physical Activity: Not on file  Stress: Not on file  Social Connections: Not on file    Review of Systems: A 12 point ROS discussed and pertinent positives are indicated in the HPI above.  All other systems are negative.  Review of Systems  Vital Signs: BP (!) 143/90   Pulse 86   Temp 98.8 F (37.1 C)   Resp 14  LMP 05/05/2023     Physical Exam Constitutional:      General: She is not in acute distress.    Appearance: Normal appearance. She is obese.  HENT:     Head: Normocephalic and atraumatic.  Eyes:     General: No scleral icterus. Cardiovascular:     Rate and Rhythm: Normal rate.  Pulmonary:     Effort: Pulmonary effort is normal.  Abdominal:     Tenderness: There is no abdominal tenderness. There is no guarding.  Skin:    General: Skin is warm and dry.  Neurological:     Mental Status: She is alert and oriented to person, place, and time.  Psychiatric:        Behavior: Behavior normal.       Imaging: IR Radiologist Eval & Mgmt Result Date: 05/20/2023 EXAM: NEW PATIENT OFFICE VISIT CHIEF COMPLAINT: SEE NOTE IN EPIC HISTORY OF PRESENT ILLNESS: SEE NOTE IN EPIC REVIEW OF SYSTEMS: SEE NOTE IN EPIC PHYSICAL EXAMINATION: SEE NOTE IN EPIC ASSESSMENT AND PLAN: SEE NOTE IN EPIC Electronically Signed   By: Malachy Moan M.D.   On: 05/20/2023 12:13   MR PELVIS W WO CONTRAST Result Date: 05/13/2023 CLINICAL DATA:  Uterine leiomyoma EXAM: MRI PELVIS WITHOUT AND WITH CONTRAST TECHNIQUE: Multiplanar multisequence MR imaging of the pelvis was performed both before and after administration of intravenous contrast. CONTRAST:  10mL GADAVIST GADOBUTROL 1 MMOL/ML IV SOLN COMPARISON:  Ultrasound pelvis dated 12/03/2022  FINDINGS: Urinary Tract:  No abnormality visualized. Bowel:  Unremarkable visualized pelvic bowel loops. Vascular/Lymphatic: No pathologically enlarged lymph nodes. No significant vascular abnormality seen. Reproductive: Enlarged uterus measures 17.4 cm in sagittal dimension. The uterine contour is distorted by multifocal enhancing T2 hypointense masses, some subserosal, some intramural, and others submucosal, for example: -10.3 x 10.1 x 8.2 cm (7:56, 9:26) dominant intramural left uterine body mass demonstrates approximately 25% submucosal component -3.3 x 3.2 x 3.0 cm (7:58, 9:7) submucosal right fundal mass demonstrates approximately 75% submucosal component -immediately inferior 3.4 x 3.3 x 3.1 cm (7:79, 9:20) submucosal right uterine body mass demonstrates approximately 50% submucosal component -3.8 x 3.4 x 3.1 cm (7:84, 2:42) submucosal anterior midline uterine body mass demonstrates 25-50% submucosal component -4.3 x 4.1 x 3.7 cm (7:43, 2:44) submucosal anterior midline fundal mass demonstrates approximately 50% submucosal component. Exophytic mass at the left anterior fundus measures 4.5 x 4.3 x 3.9 cm (7:10, 2:48). The endometrium is distorted and displaced to the anterior right secondary to these masses, measuring 8 mm. Normal right ovary along the right pelvic sidewall (3:30). Left ovary is seen superolaterally within the left lower quadrant anterior to the left psoas muscle (3:10) and contains a simple cyst measuring 3.7 x 2.3 x 2.6 cm (3:10, 5:37). Left adnexal T2 hyperintense cystic tubular structure (3:10). Other: 1.5 x 1.4 cm T2 hyperintense septated cystic focus along the left vaginal introitus (3:47). Musculoskeletal: No suspicious bone lesions identified. Degenerative change at the right anterior sacroiliac joint with osteophyte formation. Small fat containing right inguinal hernia. IMPRESSION: 1. Enlarged uterus with multifocal enhancing masses, some subserosal, some intramural, and others  submucosal, most likely uterine leiomyomata. 2. Left adnexal T2 hyperintense cystic tubular structure, likely hydrosalpinx. 3. Left ovarian simple cyst measuring up to 3.7 cm. No specific follow-up imaging recommended. 4. A 1.5 cm T2 hyperintense septated cystic focus along the left vaginal introitus, likely a Bartholin's gland cyst. 5. Small fat containing right inguinal hernia. Electronically Signed   By: Milus Height.D.  On: 05/13/2023 10:44    Labs:  CBC: Recent Labs    08/26/22 0027 12/18/22 1417 05/17/23 1551  WBC 8.6 6.7 7.3  HGB 11.8* 11.9* 12.7  HCT 37.2 35.2* 38.9  PLT 356 393 434    COAGS: Recent Labs    08/26/22 0027  INR 0.9    BMP: Recent Labs    06/16/22 1156 08/26/22 0027 12/31/22 1116 05/17/23 1551  NA 138 136 138 141  K 4.2 3.5 3.7 3.6  CL 101 103 102 102  CO2 23 22 23 24   GLUCOSE 93 104* 115* 120*  BUN 8 13 9 10   CALCIUM 8.9 8.8* 9.0 9.3  CREATININE 0.72 0.70 0.76 0.67  GFRNONAA  --  >60  --   --     LIVER FUNCTION TESTS: Recent Labs    06/16/22 1156 08/26/22 0027 12/31/22 1116 05/17/23 1551  BILITOT <0.2 0.2* 0.2 <0.2  AST 19 14* 17 14  ALT 28 23 24 22   ALKPHOS 82 56 73 79  PROT 6.6 6.8 6.6 6.9  ALBUMIN 3.9 4.1 4.1 3.9    TUMOR MARKERS: No results for input(s): "AFPTM", "CEA", "CA199", "CHROMGRNA" in the last 8760 hours.  Assessment and Plan:  Very pleasant 48 year old female with highly symptomatic uterine fibroids.  Her symptoms are severe as shown by her score of 72 out of 100 on the uterine fibroid symptom severity questionnaire.  Her symptoms are also significantly debilitating.  She scored an abysmal 4 out of 100 on the uterine fibroid health-related quality of life questionnaire.  She is also mildly anemic requiring iron supplementation.  Unfortunately, she is not a surgical candidate at this time given her recent history of stroke.  Therefore, she is an optimal candidate for minimally invasive uterine artery embolization.   The risks, benefits and alternatives to the procedure were discussed in detail and all of her questions were answered.  She strongly desires to proceed with uterine artery embolization as soon as possible.  1.) Please schedule for out-pt superior hypogastric nerve block and uterine artery embolization.  2.) Do not hold ASA prior to procedure.   Thank you for this interesting consult.  I greatly enjoyed meeting RAMEY KETCHERSIDE and look forward to participating in their care.  A copy of this report was sent to the requesting provider on this date.  Electronically Signed: Sterling Big 05/20/2023, 12:51 PM   I spent a total of 40 Minutes  in face to face in clinical consultation, greater than 50% of which was counseling/coordinating care for uterine fibroids.

## 2023-05-23 ENCOUNTER — Encounter: Payer: Self-pay | Admitting: Nurse Practitioner

## 2023-05-23 ENCOUNTER — Other Ambulatory Visit: Payer: Self-pay | Admitting: Nurse Practitioner

## 2023-05-23 DIAGNOSIS — E559 Vitamin D deficiency, unspecified: Secondary | ICD-10-CM

## 2023-05-23 MED ORDER — VITAMIN D (ERGOCALCIFEROL) 1.25 MG (50000 UNIT) PO CAPS
50000.0000 [IU] | ORAL_CAPSULE | ORAL | 0 refills | Status: DC
  Filled 2023-05-23: qty 12, 84d supply, fill #0

## 2023-05-23 MED ORDER — VITAMIN D (ERGOCALCIFEROL) 1.25 MG (50000 UNIT) PO CAPS: 50000.0000 [IU] | ORAL_CAPSULE | ORAL | 0 refills | Status: DC

## 2023-05-24 ENCOUNTER — Other Ambulatory Visit (HOSPITAL_COMMUNITY): Payer: Self-pay

## 2023-05-26 ENCOUNTER — Other Ambulatory Visit (HOSPITAL_COMMUNITY): Payer: Self-pay

## 2023-05-26 MED ORDER — OXYCODONE-ACETAMINOPHEN 10-325 MG PO TABS
1.0000 | ORAL_TABLET | Freq: Two times a day (BID) | ORAL | 0 refills | Status: DC | PRN
  Filled 2023-05-31: qty 60, 30d supply, fill #0

## 2023-05-27 ENCOUNTER — Other Ambulatory Visit (HOSPITAL_COMMUNITY): Payer: Self-pay

## 2023-05-31 ENCOUNTER — Other Ambulatory Visit (HOSPITAL_COMMUNITY): Payer: Self-pay

## 2023-06-07 ENCOUNTER — Telehealth: Payer: Self-pay

## 2023-06-07 ENCOUNTER — Other Ambulatory Visit: Payer: Self-pay

## 2023-06-07 ENCOUNTER — Other Ambulatory Visit (HOSPITAL_COMMUNITY): Payer: Self-pay

## 2023-06-07 MED ORDER — PROMETHAZINE HCL 12.5 MG PO TABS
12.5000 mg | ORAL_TABLET | ORAL | 0 refills | Status: AC | PRN
  Filled 2023-06-07: qty 30, 5d supply, fill #0

## 2023-06-07 MED ORDER — NAPROXEN 500 MG PO TABS
500.0000 mg | ORAL_TABLET | Freq: Two times a day (BID) | ORAL | 0 refills | Status: AC
  Filled 2023-06-07: qty 14, 7d supply, fill #0

## 2023-06-07 MED ORDER — DOCUSATE SODIUM 100 MG PO CAPS
100.0000 mg | ORAL_CAPSULE | Freq: Two times a day (BID) | ORAL | 0 refills | Status: AC
  Filled 2023-06-07 – 2023-06-18 (×2): qty 14, 7d supply, fill #0

## 2023-06-07 MED ORDER — ONDANSETRON HCL 8 MG PO TABS
8.0000 mg | ORAL_TABLET | Freq: Three times a day (TID) | ORAL | 0 refills | Status: AC | PRN
  Filled 2023-06-07: qty 30, 10d supply, fill #0

## 2023-06-07 NOTE — Discharge Instructions (Signed)
 Uterine Artery Embolization After Care   What can I expect after the procedure?   After the procedure, it is common to have:   Mild pain or discomfort at the arterial entry site   Uterine Cramping   Cramps can vary in strength from what you would consider to be a bad menstrual cycle all the way up to as severe as labor pains.   The cramping is typically the most severe the afternoon and evening the day of the procedure and begin to improve the next day and each day thereafter.   Vaginal bleeding. Your cycle may become irregular the first several months.   Vaginal discharge. We recommend you wear a panty liner for first 4-6 weeks following your procedure.    Nausea and vomiting.      Follow these instructions at home:   Medicines   Take your medicine exactly as told, at the same time every day. This is vital to helping you with a smooth recovery.   Zofran (ondansetron) is used to prevent nausea before it starts.  You will have a prescription to take 8 mg of this medication every 8 hours.  You should take this even if you don't feel nauseated because it is meant to prevent the nausea from occurring.  Once you get nauseated and start to vomit, you may not be able to keep your other medicines down and your pain can be left untreated.  You can take this with every other dose of the oxycodone/acetaminophen   Naproxen is a non-steroidal anti-inflammatory medicine called and is critical in keeping your inflammation and pain under control.  You must take this every 12 hrs. We recommend 8 am and 8 pm.    Percocet (oxycodone/acetaminophen) is a combination narcotic pain medication with Tylenol.  This is to help with your pain.  Take it every 4 hours regardless of your pain level the first 2 days.  Set an alarm to wake up so you don't miss a dose overnight and get behind on your pain control.  After the first 48 hrs, if your pain is minimal you can take only as needed.    Colace (docusate  sodium) is a stool softener to help prevent constipation.  The pain medications often cause constipation which can be particularly uncomfortable after [place].  We recommend you take this at 8 am and 8 pm with your naproxen.    Phenergan (promethazine) is another medication for nausea.  If you still feel sick to your stomach or vomit despite the Zofran (ondansetron) take this medicine every 8 hours as needed.    Incision care   Leave your bandage on for 24 hrs and keep the area dry   You may remove the bandage after 24 hrs and then shower as normal.    Do not submerge in a bath, pool or hot tub until the small incision is completely healed (5-7 days).   Activity   Do not lift anything that is heavier than 5 lb (2.3 kg)for the first 3 days.     Return to your normal activities after day 5.  Take it slowly and listen to your body. Ask your health care provider what activities are safe for you.   General instructions   Many women find a hot water bottle or heating pad helpful when placed on the lower abdomen.  This is fine to do if it helps your cramps.    Do not use any products that contain nicotine or tobacco.  These products include cigarettes, chewing tobacco, and vaping devices, such as e-cigarettes. These can delay incision healing. If you need help quitting, ask your health care provider.   Do not have sex or put anything in your vagina for at least 14 days.    Do not drink alcohol until your health care provider says it is okay.   Keep all follow-up visits. This is important.      Please contact our office at [phone] for the following symptoms:   You have a fever.   You have more redness, swelling, or pain around your incision.   You have more fluid or blood coming from your incision site.   Your incision feels warm to the touch.   You have pus or a bad smell coming from your incision or vagina.   You have a rash.   You have nausea, or you cannot eat or drink  anything without vomiting.   You have a vaginal discharge that is not getting lighter.   Get help right away if:   You have trouble breathing.   You have chest pain.   You have severe pain in your abdomen, and it does not get better with medicine.   You have leg pain or leg swelling.   You feel dizzy, or you faint.   Do not wait to see if the symptoms will go away.   Do not drive yourself to the hospital.      These symptoms may be an emergency.    Get help right away. Call 911.   Summary   After the procedure, it is common to have cramps, or pain or discomfort at the incision site. You will be given pain medicine.   Follow instructions from your health care provider about how to take care of your incision. Check your incision area every day for signs of infection.   Take over-the-counter and prescription medicines only as told by your health care provider.   Contact your health care provider if you have symptoms of infection or other symptoms that do not get better with treatment.   IF YOU NEED TO SPEAK TO SOMEONE AFTER HOURS, PLEASE CALL THE ON-CALL IR PHYSICIAN AT [phone].  TELL THEM THAT YOU ARE A PATIENT OF DR. MCCULLOUGH AND THAT YOU HAD A [place] TODAY AND THE ISSUES THAT YOU ARE EXPERIENCING.  Thanks for visiting [facility] [place]!

## 2023-06-08 ENCOUNTER — Ambulatory Visit
Admission: RE | Admit: 2023-06-08 | Discharge: 2023-06-08 | Disposition: A | Discharge status: Home / Self Care | Payer: Medicaid Other | Source: Ambulatory Visit | Attending: Interventional Radiology | Admitting: Interventional Radiology

## 2023-06-08 DIAGNOSIS — D259 Leiomyoma of uterus, unspecified: Secondary | ICD-10-CM

## 2023-06-08 HISTORY — PX: IR EMBO TUMOR ORGAN ISCHEMIA INFARCT INC GUIDE ROADMAPPING: IMG5449

## 2023-06-08 MED ORDER — ACETAMINOPHEN 10 MG/ML IV SOLN
1000.0000 mg | Freq: Once | INTRAVENOUS | Status: AC
  Administered 2023-06-08: 1000 mg via INTRAVENOUS

## 2023-06-08 MED ORDER — KETOROLAC TROMETHAMINE 30 MG/ML IJ SOLN
30.0000 mg | Freq: Once | INTRAMUSCULAR | Status: AC
  Administered 2023-06-08: 30 mg via INTRAVENOUS

## 2023-06-08 MED ORDER — DEXAMETHASONE SODIUM PHOSPHATE 10 MG/ML IJ SOLN
10.0000 mg | Freq: Once | INTRAMUSCULAR | Status: AC
  Administered 2023-06-08: 10 mg via INTRAVENOUS

## 2023-06-08 MED ORDER — PANTOPRAZOLE SODIUM 40 MG IV SOLR
40.0000 mg | Freq: Once | INTRAVENOUS | Status: AC
  Administered 2023-06-08: 40 mg via INTRAVENOUS

## 2023-06-08 MED ORDER — SODIUM CHLORIDE 0.9 % IV SOLN
8.0000 mg | Freq: Once | INTRAVENOUS | Status: AC
  Administered 2023-06-08: 8 mg via INTRAVENOUS

## 2023-06-08 MED ORDER — FENTANYL CITRATE PF 50 MCG/ML IJ SOSY
25.0000 ug | PREFILLED_SYRINGE | INTRAMUSCULAR | Status: DC | PRN
  Administered 2023-06-08 (×5): 50 ug via INTRAVENOUS

## 2023-06-08 MED ORDER — IOPAMIDOL (ISOVUE-300) INJECTION 61%
130.0000 mL | Freq: Once | INTRAVENOUS | Status: AC | PRN
  Administered 2023-06-08: 130 mL via INTRA_ARTERIAL

## 2023-06-08 MED ORDER — MIDAZOLAM HCL 2 MG/2ML IJ SOLN
1.0000 mg | INTRAMUSCULAR | Status: DC | PRN
  Administered 2023-06-08 (×2): 2 mg via INTRAVENOUS
  Administered 2023-06-08: 1 mg via INTRAVENOUS

## 2023-06-08 MED ORDER — CEFAZOLIN SODIUM-DEXTROSE 2-4 GM/100ML-% IV SOLN
2.0000 g | INTRAVENOUS | Status: AC
  Administered 2023-06-08: 2 g via INTRAVENOUS

## 2023-06-08 MED ORDER — HYDROMORPHONE HCL 1 MG/ML IJ SOLN
0.5000 mg | INTRAMUSCULAR | Status: AC
  Administered 2023-06-08: 1 mg via INTRAVENOUS

## 2023-06-08 MED ORDER — SODIUM CHLORIDE 0.9 % IV SOLN: INTRAVENOUS | Status: DC

## 2023-06-08 MED ORDER — KETOROLAC TROMETHAMINE 30 MG/ML IJ SOLN
30.0000 mg | Freq: Once | INTRAMUSCULAR | Status: AC
  Administered 2023-06-08: 30 mg via INTRAMUSCULAR

## 2023-06-08 MED ORDER — PROMETHAZINE HCL 12.5 MG PO TABS
12.5000 mg | ORAL_TABLET | Freq: Once | ORAL | Status: AC
  Administered 2023-06-08: 25 mg via ORAL

## 2023-06-09 ENCOUNTER — Telehealth: Payer: Self-pay

## 2023-06-10 ENCOUNTER — Other Ambulatory Visit: Payer: Self-pay | Admitting: Interventional Radiology

## 2023-06-10 DIAGNOSIS — D259 Leiomyoma of uterus, unspecified: Secondary | ICD-10-CM

## 2023-06-11 ENCOUNTER — Other Ambulatory Visit: Payer: Self-pay | Admitting: Internal Medicine

## 2023-06-11 ENCOUNTER — Other Ambulatory Visit: Payer: Self-pay | Admitting: Nurse Practitioner

## 2023-06-11 DIAGNOSIS — I10 Essential (primary) hypertension: Secondary | ICD-10-CM

## 2023-06-11 NOTE — Telephone Encounter (Signed)
 Copied from CRM [id number]. Topic: Clinical - Medication Refill >> Jun 11, 2023 12:57 PM Shelah Lewandowsky wrote: Most Recent Primary Care Visit:  Provider: Bertram Denver W  Department: [facility]  Visit Type: OFFICE VISIT  Date: 05/17/2023  Medication: amLODipine (NORVASC) 10 MG tablet  Has the patient contacted their pharmacy? Yes (Agent: If no, request that the patient contact the pharmacy for the refill. If patient does not wish to contact the pharmacy document the reason why and proceed with request.) (Agent: If yes, when and what did the pharmacy advise?)  Is this the correct pharmacy for this prescription? Yes If no, delete pharmacy and type the correct one.  This is the patient's preferred pharmacy:    [id number] [place], [place] - [address] AT [facility] [address] [place] [address] Phone: [phone] Fax: [phone]     Has the prescription been filled recently? Yes  Is the patient out of the medication? Yes  Has the patient been seen for an appointment in the last year OR does the patient have an upcoming appointment? Yes  Can we respond through MyChart? Yes  Agent: Please be advised that Rx refills may take up to 3 business days. We ask that you follow-up with your pharmacy.

## 2023-06-11 NOTE — Telephone Encounter (Signed)
 Last Fill: Today  Confirmed rcvd by pharmacy, no further action needed

## 2023-06-15 ENCOUNTER — Other Ambulatory Visit: Payer: Self-pay | Admitting: Interventional Radiology

## 2023-06-15 ENCOUNTER — Telehealth: Payer: Self-pay

## 2023-06-15 DIAGNOSIS — D259 Leiomyoma of uterus, unspecified: Secondary | ICD-10-CM

## 2023-06-16 NOTE — Progress Notes (Signed)
 Returned patient call concerning abdominal bloating. Connie Herrera states she is having abdominal bloating and back pain she reports she took a laxative last night and she is having results but she still feels bloated. I asked if she took the stool softener that was prescribed she stated sometimes, I stressed the importance of taking the stool softener especially while taking her pain medications. I advised her to increase her water intake and continue to her prescribed colace twice a day. She verbalized understanding, I also advised her to call back with any concerns.

## 2023-06-18 ENCOUNTER — Other Ambulatory Visit (HOSPITAL_COMMUNITY): Payer: Self-pay

## 2023-06-22 ENCOUNTER — Ambulatory Visit
Admission: RE | Admit: 2023-06-22 | Discharge: 2023-06-22 | Disposition: A | Discharge status: Home / Self Care | Source: Ambulatory Visit | Attending: Interventional Radiology | Admitting: Interventional Radiology

## 2023-06-22 DIAGNOSIS — D259 Leiomyoma of uterus, unspecified: Secondary | ICD-10-CM

## 2023-06-22 HISTORY — PX: IR RADIOLOGIST EVAL & MGMT: IMG5224

## 2023-06-22 NOTE — Progress Notes (Signed)
 Chief Complaint: Patient was consulted remotely today (TeleHealth) for uterine fibroids at the request of Brandonlee Navis K.    Referring Physician(s): Shatyra Becka K  History of Present Illness: Connie Herrera is a 48 y.o. female With a history of highly symptomatic uterine fibroids.  She underwent uterine artery embolization on 06/08/2023.  We had a video conference visit today via epic MyChart for her 2-week follow-up evaluation.  Connie Herrera looks and feels good.  Her cramping and recovery were as expected.  She has had no fever, chills, nausea or vomiting.  She did have some constipation which is resolving with Colace.  No significant discharge at this time.  We discussed what to expect over the coming months.  It likely will take 2 or 3 cycles for her bleeding to improve noticeably.  Her fibroids will continue to decrease in size over the next 6 months.  She understands that we will have a follow-up at 3 months and again at 6 months.  Past Medical History:  Diagnosis Date   Brain lesion    COVID-19 04/2020   CVA (cerebral vascular accident) (HCC) 04/2022   Diplopia    Hypertension    Iron deficiency anemia     Past Surgical History:  Procedure Laterality Date   AORTOGRAM N/A 04/14/2022   Procedure: DIAGNOSTIC AORTOGRAM;  Surgeon: Victorino Sparrow, MD;  Location: [facility] OR;  Service: Vascular;  Laterality: N/A;   BUBBLE STUDY  06/15/2022   Procedure: BUBBLE STUDY;  Surgeon: Maisie Fus, MD;  Location: [facility] ENDOSCOPY;  Service: Cardiovascular;;   ENDARTERECTOMY Right 04/14/2022   Procedure: CAROTID CUTDOWN WITH BRACHIOCEPHALIC STENTING, WITH ULTRASOUND OF THE RIGHT FEMORAL VEIN;  Surgeon: Victorino Sparrow, MD;  Location: [facility] OR;  Service: Vascular;  Laterality: Right;   IR EMBO TUMOR ORGAN ISCHEMIA INFARCT INC GUIDE ROADMAPPING  06/08/2023   IR RADIOLOGIST EVAL & MGMT  05/20/2023   IR RADIOLOGIST EVAL & MGMT  06/22/2023   TEE WITHOUT CARDIOVERSION N/A 06/15/2022    Procedure: TRANSESOPHAGEAL ECHOCARDIOGRAM (TEE);  Surgeon: Maisie Fus, MD;  Location: [facility] ENDOSCOPY;  Service: Cardiovascular;  Laterality: N/A;   TUBAL LIGATION      Allergies: Patient has no known allergies.  Medications: Prior to Admission medications   Medication Sig Start Date End Date Taking? Authorizing Provider  acyclovir (ZOVIRAX) 400 MG tablet TAKE 1 TABLET BY MOUTH 3 TIMES DAILY FOR 5 DAYS. 04/11/23   Claiborne Rigg, NP  amLODipine (NORVASC) 10 MG tablet TAKE 1 TABLET BY MOUTH EVERY DAY 06/11/23   Maisie Fus, MD  aspirin EC 81 MG tablet Take 1 tablet (81 mg total) by mouth daily. Take daily at 6 am. Swallow whole. 06/16/22   Claiborne Rigg, NP  Blood Pressure Monitor DEVI Please provide patient with insurance approved blood pressure monitor 12/23/22   Claiborne Rigg, NP  cyclobenzaprine (FLEXERIL) 10 MG tablet Take 10 mg by mouth at bedtime. 06/05/22   [provider]  docusate sodium (COLACE) 100 MG capsule Take 1 capsule (100 mg total) by mouth 2 (two) times daily for 7 days. 06/07/23 06/25/23  Sterling Big, MD  ferrous sulfate 325 (65 FE) MG EC tablet Take 1 tablet (325 mg total) by mouth 2 (two) times daily. Patient taking differently: Take 1 tablet by mouth daily with breakfast. 01/08/22   Rachel Moulds, MD  gabapentin (NEURONTIN) 100 MG capsule Take 1-4 capsules (100-400 mg total) by mouth at bedtime as needed. 11/27/22   Richardean Sale,  DO  losartan (COZAAR) 50 MG tablet Take 1 tablet (50 mg total) by mouth daily. 12/23/22   Claiborne Rigg, NP  naloxone [facility]) nasal spray 4 mg/0.1 mL Place 1 spray into the nose as needed. 04/30/22   [provider]  ondansetron (ZOFRAN) 8 MG tablet Take 1 tablet (8 mg total) by mouth every 8 (eight) hours as needed for nausea or vomiting. 06/07/23   Sterling Big, MD  oxyCODONE-acetaminophen (PERCOCET) 10-325 MG tablet Take 1 tablet by mouth 2 (two) times daily as needed. 05/26/23     pantoprazole (PROTONIX)  40 MG tablet TAKE 1 TABLET BY MOUTH EVERY DAY 05/17/23   Claiborne Rigg, NP  promethazine (PHENERGAN) 12.5 MG tablet Take 1 tablet (12.5 mg total) by mouth every 4 (four) hours as needed for nausea or vomiting. 06/07/23   Sterling Big, MD  rosuvastatin (CRESTOR) 10 MG tablet Take 1 tablet (10 mg total) by mouth daily. 06/16/22   Claiborne Rigg, NP  triamcinolone ointment (KENALOG) 0.1 % APPLY TO AFFECTED AREA TWICE A DAY 12/21/22   Hoy Register, MD  Vitamin D, Ergocalciferol, (DRISDOL) 1.25 MG (50000 UNIT) CAPS capsule Take 1 capsule (50,000 Units total) by mouth once a week. 05/23/23   Claiborne Rigg, NP  hydrochlorothiazide (HYDRODIURIL) 25 MG tablet Take 1 tablet (25 mg total) by mouth daily. Must have office visit for refills 06/20/20 08/13/20  Waldon Merl, PA-C     Family History  Problem Relation Age of Onset   Healthy Mother    Cirrhosis Father    Cancer Maternal Grandmother 48       breast   Breast cancer Maternal Grandmother     Social History   Socioeconomic History   Marital status: Single    Spouse name: Not on file   Number of children: 4   Years of education: 14   Highest education level: Some college, no degree  Occupational History   Occupation: Dietary Supervisor    Comment: [employer]  Tobacco Use   Smoking status: Former    Current packs/day: 0.00    Average packs/day: 0.5 packs/day for 4.0 years (2.0 ttl pk-yrs)    Types: Cigarettes    Start date: 06/04/2013    Quit date: 06/04/2017    Years since quitting: 6.0    Passive exposure: Past   Smokeless tobacco: Never  Vaping Use   Vaping status: Never Used  Substance and Sexual Activity   Alcohol use: No   Drug use: Yes    Types: Marijuana    Comment: 09/30/20 maybe once a week   Sexual activity: Yes    Birth control/protection: Surgical  Other Topics Concern   Not on file  Social History Narrative   Patient lives at home with children.    Patient has 4 children.    Patient is single.     Patient has 2 years of college.    Patient is right handed.    Patient is working full time. [employer].    Social Drivers of Corporate investment banker Strain: Not on file  Food Insecurity: No Food Insecurity (04/10/2022)   Hunger Vital Sign    Worried About Running Out of Food in the Last Year: Never true    Ran Out of Food in the Last Year: Never true  Transportation Needs: No Transportation Needs (04/10/2022)   PRAPARE - Administrator, Civil Service (Medical): No    Lack of Transportation (  Non-Medical): No  Physical Activity: Not on file  Stress: Not on file  Social Connections: Not on file    Review of Systems  Review of Systems: A 12 point ROS discussed and pertinent positives are indicated in the HPI above.  All other systems are negative.    Physical Exam No direct physical exam was performed (except for noted visual exam findings with Video Visits).    Vital Signs: There were no vitals taken for this visit.  Imaging: IR Radiologist Eval & Mgmt Result Date: 06/22/2023 EXAM: ESTABLISHED PATIENT OFFICE VISIT CHIEF COMPLAINT: SEE NOTE IN EPIC HISTORY OF PRESENT ILLNESS: SEE NOTE IN EPIC REVIEW OF SYSTEMS: SEE NOTE IN EPIC PHYSICAL EXAMINATION: SEE NOTE IN EPIC ASSESSMENT AND PLAN: SEE NOTE IN EPIC Electronically Signed   By: Malachy Moan M.D.   On: 06/22/2023 08:45   IR EMBO TUMOR ORGAN ISCHEMIA INFARCT INC GUIDE ROADMAPPING Result Date: 06/08/2023 INDICATION: Symptomatic uterine fibroids. EXAM: IR EMBO TUMOR ORGAN ISCHEMIA INFARCT INC GUIDE ROADMAPPING 1.)  bilateral [place] MEDICATIONS: PRE PROCEDURE: 1000 mg Tylenol IV, 10 mg Decadron IV, 2 g Ancef IV, Protonix 40 mg IV, Zofran 8 mg IV administered by radiology nursing under my supervision. INTRA PROCEDURE: Toradol 30 mg IV, Toradol 30 mg IM administered by radiology nursing under my supervision. POST PROCEDURE: Dilaudid 1 mg IV and Phenergan 25 mg p.o. administered by radiology nursing under my  supervision. ANESTHESIA/SEDATION: Moderate (conscious) sedation was employed during this procedure. A total of Versed 5 mg and Fentanyl 250 mcg was administered intravenously by radiology nursing under my supervision. Moderate Sedation Time: 65 minutes. The patient's level of consciousness and vital signs were monitored continuously by radiology nursing throughout the procedure under my direct supervision. CONTRAST:  ISOVUE-300 IOPAMIDOL (ISOVUE-300) INJECTION 61% FLUOROSCOPY: Radiation Exposure Index (as provided by the fluoroscopic device): 135.2 mGy Kerma COMPLICATIONS: None immediate. PROCEDURE: Informed consent was obtained from the patient following explanation of the procedure, risks, benefits and alternatives. The patient understands, agrees and consents for the procedure. All questions were addressed. A time out was performed prior to the initiation of the procedure. Maximal barrier sterile technique utilized including caps, mask, sterile gowns, sterile gloves, large sterile drape, hand hygiene, and Betadine prep. The right common femoral artery was interrogated with ultrasound and found to be widely patent. An image was obtained and stored for the medical record. Local anesthesia was attained by infiltration with 1% lidocaine. A small dermatotomy was made. Under real-time sonographic guidance, the vessel was punctured with a 21 gauge micropuncture needle. Using standard technique, the initial micro needle was exchanged over a 0.018 micro wire for a transitional 4 [place] micro sheath. The micro sheath was then exchanged over a 0.035 wire for a 5 French vascular sheath. A C2 cobra catheter was advanced over a Bentson wire up in over the aortic bifurcation and into the left internal iliac artery. An attempt was made at superior hypogastric nerve block, however the Chiba needle could not be advanced through the enlarged fibroid uterus. Therefore, the nerve block was abandoned. A left internal iliac  arteriogram was performed. The origin of the left uterine artery was identified. A renegade hiFlo microcatheter was advanced over a Fathom 16 wire into the horizontal segment of the left uterine artery. Arteriography was performed confirming opacification of the intramural uterine branches. No evidence of cervical vaginal branch. Particle embolization was performed utilizing 1 vial of 500 micron Embozene and 1 vial of 700 micron Embozene and 1.5 vials 900 micron  Embozene. Postembolization arteriography confirms near stasis. The microcatheter was removed. The C2 cobra catheter was formed into a Waltman's loop and used to select the ipsilateral right internal iliac artery. Contrast injection was performed with digital subtraction angiography and no right renal artery was visualized. Additional angiography was performed from more proximally in the hypogastric artery in order to identify a high origin of the uterine artery. No uterine artery was identified. The C2 cobra catheter was exchanged for an Omni Flush catheter and a flush aortogram was performed. Good visualization of both renal arteries. No hypertrophic ovarian arteries were identified. No evidence of replaced uterine artery. Hemostasis was then attained with the assistance of a Celt arterial closure device (ACD). IMPRESSION: 1. Technically successful uterine artery embolization. 2. 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 Electronically Signed   By: Malachy Moan M.D.   On: 06/08/2023 11:42    Labs:  CBC: Recent Labs    08/26/22 0027 12/18/22 1417 05/17/23 1551  WBC 8.6 6.7 7.3  HGB 11.8* 11.9* 12.7  HCT 37.2 35.2* 38.9  PLT 356 393 434    COAGS: Recent Labs    08/26/22 0027  INR 0.9    BMP: Recent Labs    08/26/22 0027 12/31/22 1116 05/17/23 1551  NA 136 138 141  K 3.5 3.7 3.6  CL 103 102 102  CO2 22 23 24   GLUCOSE  104* 115* 120*  BUN 13 9 10   CALCIUM 8.8* 9.0 9.3  CREATININE 0.70 0.76 0.67  GFRNONAA >60  --   --     LIVER FUNCTION TESTS: Recent Labs    08/26/22 0027 12/31/22 1116 05/17/23 1551  BILITOT 0.2* 0.2 <0.2  AST 14* 17 14  ALT 23 24 22   ALKPHOS 56 73 79  PROT 6.8 6.6 6.9  ALBUMIN 4.1 4.1 3.9    TUMOR MARKERS: No results for input(s): "AFPTM", "CEA", "CA199", "CHROMGRNA" in the last 8760 hours.  Assessment and Plan:  Very pleasant 48 year old female with highly symptomatic uterine fibroids.  She is now 2 weeks status post uterine fibroid embolization and is doing very well.  She has completely recovered.  She has no additional cramping.  No issues or pain at her right common femoral artery access site.  We discussed what to expect over the next several months and I was able to answer all of her questions.  1.) next follow-up appointment with me in 3 months.    Electronically Signed: Sterling Big 06/22/2023, 9:01 AM   I spent a total of  15 Minutes in remote  clinical consultation, greater than 50% of which was counseling/coordinating care for uterine fibroids.    Visit type: Audio and video Financial planner).   Alternative for in-person consultation at [facility], [address] [place], [place], [place]. This visit type was conducted due to national recommendations for restrictions regarding the COVID-19 Pandemic (e.g. social distancing).  This format is felt to be most appropriate for this patient at this time.  All issues noted in this document were discussed and addressed.

## 2023-06-28 ENCOUNTER — Other Ambulatory Visit (HOSPITAL_COMMUNITY): Payer: Self-pay

## 2023-06-28 MED ORDER — OXYCODONE-ACETAMINOPHEN 10-325 MG PO TABS
1.0000 | ORAL_TABLET | Freq: Two times a day (BID) | ORAL | 0 refills | Status: DC | PRN
  Filled 2023-06-28: qty 60, 30d supply, fill #0

## 2023-07-03 ENCOUNTER — Other Ambulatory Visit: Payer: Self-pay | Admitting: Nurse Practitioner

## 2023-07-03 DIAGNOSIS — B009 Herpesviral infection, unspecified: Secondary | ICD-10-CM

## 2023-07-04 ENCOUNTER — Other Ambulatory Visit: Payer: Self-pay | Admitting: Nurse Practitioner

## 2023-07-04 DIAGNOSIS — Z8673 Personal history of transient ischemic attack (TIA), and cerebral infarction without residual deficits: Secondary | ICD-10-CM

## 2023-07-30 ENCOUNTER — Other Ambulatory Visit: Payer: Self-pay

## 2023-07-30 ENCOUNTER — Other Ambulatory Visit (HOSPITAL_COMMUNITY): Payer: Self-pay

## 2023-07-30 MED ORDER — NALOXONE HCL 4 MG/0.1ML NA LIQD
1.0000 | NASAL | 0 refills | Status: AC
  Filled 2023-07-30: qty 2, 30d supply, fill #0

## 2023-07-30 MED ORDER — OXYCODONE-ACETAMINOPHEN 10-325 MG PO TABS
1.0000 | ORAL_TABLET | Freq: Two times a day (BID) | ORAL | 0 refills | Status: DC | PRN
  Filled 2023-07-30 (×2): qty 60, 30d supply, fill #0

## 2023-07-30 MED ORDER — CYCLOBENZAPRINE HCL 10 MG PO TABS
10.0000 mg | ORAL_TABLET | Freq: Every evening | ORAL | 0 refills | Status: DC | PRN
  Filled 2023-07-30: qty 30, 30d supply, fill #0

## 2023-08-02 ENCOUNTER — Other Ambulatory Visit (HOSPITAL_COMMUNITY): Payer: Self-pay

## 2023-08-04 ENCOUNTER — Other Ambulatory Visit (HOSPITAL_COMMUNITY): Payer: Self-pay

## 2023-08-16 ENCOUNTER — Ambulatory Visit: Payer: Self-pay | Admitting: Nurse Practitioner

## 2023-09-01 ENCOUNTER — Other Ambulatory Visit: Payer: Self-pay | Admitting: Family Medicine

## 2023-09-01 ENCOUNTER — Other Ambulatory Visit: Payer: Self-pay | Admitting: Family

## 2023-09-01 ENCOUNTER — Other Ambulatory Visit: Payer: Self-pay | Admitting: Nurse Practitioner

## 2023-09-01 DIAGNOSIS — L853 Xerosis cutis: Secondary | ICD-10-CM

## 2023-09-01 DIAGNOSIS — L309 Dermatitis, unspecified: Secondary | ICD-10-CM

## 2023-09-01 DIAGNOSIS — L299 Pruritus, unspecified: Secondary | ICD-10-CM

## 2023-09-01 DIAGNOSIS — B009 Herpesviral infection, unspecified: Secondary | ICD-10-CM

## 2023-09-08 ENCOUNTER — Ambulatory Visit: Admitting: Nurse Practitioner

## 2023-09-08 ENCOUNTER — Other Ambulatory Visit: Payer: Self-pay | Admitting: Nurse Practitioner

## 2023-09-08 ENCOUNTER — Other Ambulatory Visit: Payer: Self-pay

## 2023-09-08 DIAGNOSIS — I10 Essential (primary) hypertension: Secondary | ICD-10-CM

## 2023-09-08 MED ORDER — AMLODIPINE BESYLATE 10 MG PO TABS: 10.0000 mg | ORAL_TABLET | Freq: Every day | ORAL | 0 refills | Status: DC

## 2023-09-21 ENCOUNTER — Other Ambulatory Visit: Payer: Self-pay | Admitting: Nurse Practitioner

## 2023-09-21 ENCOUNTER — Telehealth: Payer: Self-pay | Admitting: Nurse Practitioner

## 2023-09-21 DIAGNOSIS — Z8673 Personal history of transient ischemic attack (TIA), and cerebral infarction without residual deficits: Secondary | ICD-10-CM

## 2023-09-21 DIAGNOSIS — I10 Essential (primary) hypertension: Secondary | ICD-10-CM

## 2023-09-21 NOTE — Telephone Encounter (Signed)
 Copied from CRM [id number]. Topic: Clinical - Medication Question >> Sep 21, 2023  9:25 AM Marissa P wrote:  Reason for CRM: This patient needs this med approved: losartan  (COZAAR ) 50 MG tablet Please for her blood pressure she stated but soonest doctor had that she could see her is July 18th to get scheduled please advise and patient does use mychart or you can call her.

## 2023-09-21 NOTE — Telephone Encounter (Signed)
 Pt states she is out of her losartan  50 mg and is requesting refill as soon as possible. Pt states she has been out of her medication for a few days as she lost the bottle and patient has not had it in about 3 days.   Pt requesting this be filled as soon as possible.

## 2023-09-21 NOTE — Telephone Encounter (Signed)
 Copied from CRM [id number]. Topic: Clinical - Medication Refill >> Sep 21, 2023  9:21 AM Marissa P wrote: Medication: rosuvastatin  (CRESTOR ) 10 MG tablet losartan  (COZAAR ) 50 MG tablet  Has the patient contacted their pharmacy? No (Agent: If no, request that the patient contact the pharmacy for the refill. If patient does not wish to contact the pharmacy document the reason why and proceed with request.) (Agent: If yes, when and what did the pharmacy advise?)  This is the patient's preferred pharmacy:   [id number] [place], [place] - [address] AT [facility] [address] [place] [address] Phone: [phone] Fax: [phone]   Is this the correct pharmacy for this prescription? Yes If no, delete pharmacy and type the correct one.   Has the prescription been filled recently? Yes  Is the patient out of the medication? Yes  Has the patient been seen for an appointment in the last year OR does the patient have an upcoming appointment? Yes  Can we respond through MyChart? Yes  Agent: Please be advised that Rx refills may take up to 3 business days. We ask that you follow-up with your pharmacy.

## 2023-09-21 NOTE — Telephone Encounter (Signed)
 Losartan was sent today.

## 2023-09-23 NOTE — Telephone Encounter (Signed)
 Noted. Patient is aware.

## 2023-09-23 NOTE — Telephone Encounter (Signed)
 Requested Prescriptions  Refused Prescriptions Disp Refills   losartan  (COZAAR ) 50 MG tablet 90 tablet 1    Sig: Take 1 tablet (50 mg total) by mouth daily.     Cardiovascular:  Angiotensin Receptor Blockers Failed - 09/23/2023 11:56 AM      Failed - Last BP in normal range    BP Readings from Last 1 Encounters:  06/08/23 (!) 154/79         Passed - Cr in normal range and within 180 days    Creatinine, Ser  Date Value Ref Range Status  05/17/2023 0.67 0.57 - 1.00 mg/dL Final         Passed - K in normal range and within 180 days    Potassium  Date Value Ref Range Status  05/17/2023 3.6 3.5 - 5.2 mmol/L Final         Passed - Patient is not pregnant      Passed - Valid encounter within last 6 months    Recent Outpatient Visits           4 months ago Primary hypertension   [facility] [place]. [facility] Collins Dean, NP   7 months ago Submucous leiomyoma of uterus   [place] [facility] [place] [facility] [place]. [facility] Collins Dean, NP   8 months ago Primary hypertension   [place] [facility] [place]. [facility] Valente Gaskin, RPH-CPP   9 months ago Primary hypertension   [place] [facility] [place] [facility] Of [place]. [facility] Collins Dean, NP   1 year ago Left-sided weakness   [place] [facility] [place] [facility] Of [place]. [facility] [place], [place] W, NP               rosuvastatin  (CRESTOR ) 10 MG tablet 90 tablet 3    Sig: Take 1 tablet (10 mg total) by mouth daily.     Cardiovascular:  Antilipid - Statins 2 Failed - 09/23/2023 11:56 AM      Failed - Lipid Panel in normal range within the last 12 months    Cholesterol, Total  Date Value Ref Range Status  03/07/2019 138 100 - 199 mg/dL Final   Cholesterol  Date Value Ref Range Status  04/10/2022 138 0 - 200 mg/dL Final   LDL Chol Calc (NIH)  Date  Value Ref Range Status  03/07/2019 87 0 - 99 mg/dL Final   LDL Cholesterol  Date Value Ref Range Status  04/10/2022 68 0 - 99 mg/dL Final    Comment:           Total Cholesterol/HDL:CHD Risk Coronary Heart Disease Risk Table                     Men   Women  1/2 Average Risk   3.4   3.3  Average Risk       5.0   4.4  2 X Average Risk   9.6   7.1  3 X Average Risk  23.4   11.0        Use the calculated Patient Ratio above and the CHD Risk Table to determine the patient's CHD Risk.        ATP III CLASSIFICATION (LDL):  <100     mg/dL   Optimal  782-956  mg/dL   Near or Above                    Optimal  130-159  mg/dL   Borderline  161-096  mg/dL   High  >045     mg/dL   Very High Performed at [facility], [address] [place], [place] [address]    HDL  Date Value Ref Range Status  04/10/2022 36 (L) >40 mg/dL Final  19/14/7829 30 (L) >39 mg/dL Final   Triglycerides  Date Value Ref Range Status  04/10/2022 171 (H) <150 mg/dL Final         Passed - Cr in normal range and within 360 days    Creatinine, Ser  Date Value Ref Range Status  05/17/2023 0.67 0.57 - 1.00 mg/dL Final         Passed - Patient is not pregnant      Passed - Valid encounter within last 12 months    Recent Outpatient Visits           4 months ago Primary hypertension   [facility] [place]. [facility] Dean, NP   7 months ago Submucous leiomyoma of uterus   [place] [facility] [place]. [facility] Collins Dean, NP   8 months ago Primary hypertension   [place] [facility] [place]. [facility] Valente Gaskin, RPH-CPP   9 months ago Primary hypertension   [place] [facility] [place] [facility] [place]. [facility] Collins Dean, NP   1 year ago Left-sided weakness    [facility] [place] - [facility] Dept Of . [facility] Collins Dean, [place]

## 2023-10-14 ENCOUNTER — Other Ambulatory Visit: Payer: Self-pay | Admitting: Family Medicine

## 2023-10-14 DIAGNOSIS — I10 Essential (primary) hypertension: Secondary | ICD-10-CM

## 2023-10-18 ENCOUNTER — Other Ambulatory Visit: Payer: Self-pay | Admitting: Family Medicine

## 2023-10-18 DIAGNOSIS — I10 Essential (primary) hypertension: Secondary | ICD-10-CM

## 2023-10-21 ENCOUNTER — Telehealth: Payer: Self-pay | Admitting: Nurse Practitioner

## 2023-10-21 NOTE — Telephone Encounter (Signed)
Contacted pt confirmed appt

## 2023-10-22 ENCOUNTER — Encounter: Payer: Self-pay | Admitting: Nurse Practitioner

## 2023-10-22 ENCOUNTER — Ambulatory Visit: Attending: Nurse Practitioner | Admitting: Nurse Practitioner

## 2023-10-22 ENCOUNTER — Other Ambulatory Visit (HOSPITAL_COMMUNITY): Payer: Self-pay

## 2023-10-22 VITALS — BP 131/84 | HR 95 | Resp 20 | Ht 67.0 in | Wt 244.0 lb

## 2023-10-22 DIAGNOSIS — I10 Essential (primary) hypertension: Secondary | ICD-10-CM

## 2023-10-22 DIAGNOSIS — Z1211 Encounter for screening for malignant neoplasm of colon: Secondary | ICD-10-CM

## 2023-10-22 DIAGNOSIS — L309 Dermatitis, unspecified: Secondary | ICD-10-CM | POA: Diagnosis not present

## 2023-10-22 DIAGNOSIS — Z8673 Personal history of transient ischemic attack (TIA), and cerebral infarction without residual deficits: Secondary | ICD-10-CM

## 2023-10-22 DIAGNOSIS — E559 Vitamin D deficiency, unspecified: Secondary | ICD-10-CM

## 2023-10-22 DIAGNOSIS — D500 Iron deficiency anemia secondary to blood loss (chronic): Secondary | ICD-10-CM

## 2023-10-22 MED ORDER — TRIAMCINOLONE ACETONIDE 0.1 % EX OINT: TOPICAL_OINTMENT | Freq: Two times a day (BID) | CUTANEOUS | 3 refills | Status: AC

## 2023-10-22 MED ORDER — TRIAMCINOLONE ACETONIDE 0.1 % EX OINT
TOPICAL_OINTMENT | Freq: Two times a day (BID) | CUTANEOUS | 3 refills | Status: DC
  Filled 2023-10-22: qty 30, 30d supply, fill #0

## 2023-10-22 MED ORDER — ASPIRIN 81 MG PO TBEC: 81.0000 mg | DELAYED_RELEASE_TABLET | Freq: Every day | ORAL | 3 refills | Status: AC

## 2023-10-22 MED ORDER — FERROUS SULFATE 325 (65 FE) MG PO TBEC
1.0000 | DELAYED_RELEASE_TABLET | Freq: Two times a day (BID) | ORAL | 3 refills | Status: AC
  Filled 2023-10-22: qty 180, 90d supply, fill #0
  Filled 2023-10-22: qty 60, 30d supply, fill #0

## 2023-10-22 MED ORDER — LOSARTAN POTASSIUM 50 MG PO TABS
50.0000 mg | ORAL_TABLET | Freq: Every day | ORAL | 1 refills | Status: DC
  Filled 2023-10-22: qty 90, 90d supply, fill #0
  Filled 2024-01-17 – 2024-01-29 (×2): qty 90, 90d supply, fill #1

## 2023-10-22 NOTE — Progress Notes (Signed)
 Assessment & Plan:  [name] was seen today for medication refill and hypertension.  Diagnoses and all orders for this visit:  Primary hypertension -     losartan  (COZAAR ) 50 MG tablet; Take 1 tablet (50 mg total) by mouth daily. Continue all antihypertensives as prescribed.  Reminded to bring in blood pressure log for follow  up appointment.  RECOMMENDATIONS: DASH/Mediterranean Diets are healthier choices for HTN.    History of CVA  -     aspirin  EC 81 MG tablet; Take 1 tablet (81 mg total) by mouth daily. Take daily at 6 am. Swallow whole.  Eczema, unspecified type Symptoms well controlled.  -     triamcinolone  ointment (KENALOG ) 0.1 %; Apply topically 2 (two) times daily.  Colon cancer screening -     Ambulatory referral to Gastroenterology  Iron deficiency anemia due to chronic blood loss -     ferrous sulfate  325 (65 FE) MG EC tablet; Take 1 tablet (325 mg total) by mouth 2 (two) times daily. -     CBC with Differential -     Iron, TIBC and Ferritin Panel  Vitamin D  deficiency disease She is not taking vitamin d  as prescribed.  -     VITAMIN D  25 Hydroxy (Vit-D Deficiency, Fractures)    Patient has been counseled on age-appropriate routine health concerns for screening and prevention. These are reviewed and up-to-date. Referrals have been placed accordingly. Immunizations are up-to-date or declined.    Subjective:   Chief Complaint  Patient presents with   Medication Refill   Hypertension    Connie Herrera 48 y.o. 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 She is taking amlodipine  10 mg daily and losartan  50 mg daily.  BP Readings from Last 3 Encounters:  10/22/23 131/84  06/08/23 (!) 154/79  05/20/23 (!) 143/90    She endorses lack of energy and increased fatigue along  with hypersomnia. She does have a history of anemia.    Review of Systems  Constitutional:  Positive for malaise/fatigue. Negative for fever and weight loss.  HENT: Negative.  Negative for nosebleeds.   Eyes: Negative.  Negative for blurred vision, double vision and photophobia.  Respiratory: Negative.  Negative for cough and shortness of breath.   Cardiovascular: Negative.  Negative for chest pain, palpitations and leg swelling.  Gastrointestinal: Negative.  Negative for heartburn, nausea and vomiting.  Musculoskeletal: Negative.  Negative for myalgias.  Neurological: Negative.  Negative for dizziness, focal weakness, seizures and headaches.  Psychiatric/Behavioral: Negative.  Negative for suicidal ideas.     Past Medical History:  Diagnosis Date   Brain lesion    COVID-19 04/2020   CVA (cerebral vascular accident) (HCC) 04/2022   Diplopia    Hypertension    Iron deficiency anemia     Past Surgical History:  Procedure Laterality Date   AORTOGRAM N/A 04/14/2022   Procedure: DIAGNOSTIC AORTOGRAM;  Surgeon: Lanis Fonda BRAVO, MD;  Location: [facility] OR;  Service: Vascular;  Laterality: N/A;   BUBBLE STUDY  06/15/2022   Procedure: BUBBLE STUDY;  Surgeon: Alvan Ronal BRAVO, MD;  Location: [facility] ENDOSCOPY;  Service: Cardiovascular;;   ENDARTERECTOMY Right 04/14/2022   Procedure: CAROTID CUTDOWN WITH BRACHIOCEPHALIC STENTING, WITH ULTRASOUND OF THE RIGHT FEMORAL VEIN;  Surgeon: Lanis Fonda BRAVO, MD;  Location: [facility] OR;  Service: Vascular;  Laterality: Right;   IR EMBO TUMOR ORGAN ISCHEMIA INFARCT INC GUIDE ROADMAPPING  06/08/2023   IR RADIOLOGIST EVAL & MGMT  05/20/2023   IR RADIOLOGIST EVAL & MGMT  06/22/2023   TEE WITHOUT CARDIOVERSION N/A 06/15/2022   Procedure: TRANSESOPHAGEAL ECHOCARDIOGRAM (TEE);  Surgeon: Alvan Ronal BRAVO, MD;  Location[facility]COPY;  Service: Cardiovascular;  Laterality: N/A;   TUBAL LIGATION      Family History  Problem Relation Age of Onset   Healthy Mother    Cirrhosis Father     Cancer Maternal Grandmother 47       breast   Breast cancer Maternal Grandmother     Social History Reviewed with no changes to be made today.   Outpatient Medications Prior to Visit  Medication Sig Dispense Refill   amLODipine  (NORVASC ) 10 MG tablet Take 1 tablet (10 mg total) by mouth daily. 90 tablet 0   Blood Pressure Monitor DEVI Please provide patient with insurance approved blood pressure monitor 1 each 0   cyclobenzaprine  (FLEXERIL ) 10 MG tablet Take 1 tablet (10 mg total) by mouth at bedtime as needed. 30 tablet 0   gabapentin  (NEURONTIN ) 100 MG capsule Take 1-4 capsules (100-400 mg total) by mouth at bedtime as needed. 50 capsule 0   naloxone  (NARCAN ) nasal spray 4 mg/0.1 mL Use 1 spray every 3 minutes, spray 1 dose into ONE nostril, alternate nostrils with each dose until help arrives 2 each 0   ondansetron  (ZOFRAN ) 8 MG tablet Take 1 tablet (8 mg total) by mouth every 8 (eight) hours as needed for nausea or vomiting. 30 tablet 0   oxyCODONE -acetaminophen  (PERCOCET) 10-325 MG tablet Take 1 tablet by mouth 2 (two) times daily as needed. 60 tablet 0   pantoprazole  (PROTONIX ) 40 MG tablet TAKE 1 TABLET BY MOUTH EVERY DAY 90 tablet 1   promethazine  (PHENERGAN ) 12.5 MG tablet Take 1 tablet (12.5 mg total) by mouth every 4 (four) hours as needed for nausea or vomiting. 30 tablet 0   rosuvastatin  (CRESTOR ) 10 MG tablet TAKE 1 TABLET BY MOUTH EVERY DAY 90 tablet 3   aspirin  EC 81 MG tablet Take 1 tablet (81 mg total) by mouth daily. Take daily at 6 am. Swallow whole. 90 tablet 3   ferrous sulfate  325 (65 FE) MG EC tablet Take 1 tablet (325 mg total) by mouth 2 (two) times daily. (Patient taking differently: Take 1 tablet by mouth daily with breakfast.) 60 tablet 3   losartan  (COZAAR ) 50 MG tablet TAKE 1 TABLET BY MOUTH EVERY DAY 30 tablet 0   triamcinolone  ointment (KENALOG ) 0.1 % APPLY TO AFFECTED AREA TWICE A DAY 30 g 0   acyclovir  (ZOVIRAX ) 400 MG tablet TAKE 1 TABLET BY MOUTH 3 TIMES  DAILY FOR 5 DAYS. (Patient not taking: Reported on 10/22/2023) 15 tablet 1   cyclobenzaprine  (FLEXERIL ) 10 MG tablet Take 10 mg by mouth at bedtime.     naloxone  (NARCAN ) nasal spray 4 mg/0.1 mL Place 1 spray into the nose as needed.     Vitamin D , Ergocalciferol , (DRISDOL ) 1.25 MG (50000 UNIT) CAPS capsule Take 1 capsule (50,000 Units total) by mouth once a week. 12 capsule 0   No facility-administered medications prior to visit.    No Known Allergies     Objective:    BP 131/84 (BP Location: Left Arm, Patient Position: Sitting, Cuff Size: Normal)   Pulse 95   Resp 20   Ht 5' 7 (1.702 m)   Wt 244 lb (110.7 kg)  LMP 10/11/2023 (Exact Date)   SpO2 100%   BMI 38.22 kg/m  Wt Readings from Last 3 Encounters:  10/22/23 244 lb (110.7 kg)  05/17/23 237 lb 9.6 oz (107.8 kg)  02/16/23 235 lb 9.6 oz (106.9 kg)    Physical Exam Vitals and nursing note reviewed.  Constitutional:      Appearance: She is well-developed.  HENT:     Head: Normocephalic and atraumatic.  Cardiovascular:     Rate and Rhythm: Normal rate and regular rhythm.     Heart sounds: Normal heart sounds. No murmur heard.    No friction rub. No gallop.  Pulmonary:     Effort: Pulmonary effort is normal. No tachypnea or respiratory distress.     Breath sounds: Normal breath sounds. No decreased breath sounds, wheezing, rhonchi or rales.  Chest:     Chest wall: No tenderness.  Abdominal:     General: Bowel sounds are normal.     Palpations: Abdomen is soft.  Musculoskeletal:        General: Normal range of motion.     Cervical back: Normal range of motion.  Skin:    General: Skin is warm and dry.  Neurological:     Mental Status: She is alert and oriented to person, place, and time.     Coordination: Coordination normal.  Psychiatric:        Behavior: Behavior normal. Behavior is cooperative.        Thought Content: Thought content normal.        Judgment: Judgment normal.          Patient has been  counseled extensively about nutrition and exercise as well as the importance of adherence with medications and regular follow-up. The patient was given clear instructions to go to ER or return to medical center if symptoms don't improve, worsen or new problems develop. The patient verbalized understanding.   Follow-up: Return in about 3 months (around 01/22/2024).   Haze LELON Servant, FNP-BC [facility] [place], [place] [phone]   10/22/2023, 2:49 PM

## 2023-10-25 ENCOUNTER — Other Ambulatory Visit: Payer: Self-pay

## 2023-10-25 ENCOUNTER — Other Ambulatory Visit (HOSPITAL_COMMUNITY): Payer: Self-pay

## 2023-10-26 LAB — CBC WITH DIFFERENTIAL/PLATELET
Basophils Absolute: 0 x10E3/uL (ref 0.0–0.2)
Basos: 0 %
EOS (ABSOLUTE): 0.1 x10E3/uL (ref 0.0–0.4)
Eos: 1 %
Hematocrit: 39.1 % (ref 34.0–46.6)
Hemoglobin: 12.6 g/dL (ref 11.1–15.9)
Immature Grans (Abs): 0 x10E3/uL (ref 0.0–0.1)
Immature Granulocytes: 0 %
Lymphocytes Absolute: 1.8 x10E3/uL (ref 0.7–3.1)
Lymphs: 25 %
MCH: 27.8 pg (ref 26.6–33.0)
MCHC: 32.2 g/dL (ref 31.5–35.7)
MCV: 86 fL (ref 79–97)
Monocytes Absolute: 0.5 x10E3/uL (ref 0.1–0.9)
Monocytes: 8 %
Neutrophils Absolute: 4.7 x10E3/uL (ref 1.4–7.0)
Neutrophils: 66 %
Platelets: 399 x10E3/uL (ref 150–450)
RBC: 4.53 x10E6/uL (ref 3.77–5.28)
RDW: 13.8 % (ref 11.7–15.4)
WBC: 7.1 x10E3/uL (ref 3.4–10.8)

## 2023-10-26 LAB — VITAMIN D 25 HYDROXY (VIT D DEFICIENCY, FRACTURES): Vit D, 25-Hydroxy: 28.5 ng/mL — ABNORMAL LOW (ref 30.0–100.0)

## 2023-10-26 LAB — IRON,TIBC AND FERRITIN PANEL
Ferritin: 40 ng/mL (ref 15–150)
Iron Saturation: 10 % — ABNORMAL LOW (ref 15–55)
Iron: 35 ug/dL (ref 27–159)
Total Iron Binding Capacity: 344 ug/dL (ref 250–450)
UIBC: 309 ug/dL (ref 131–425)

## 2023-10-29 ENCOUNTER — Other Ambulatory Visit: Payer: Self-pay | Admitting: Nurse Practitioner

## 2023-10-29 DIAGNOSIS — E559 Vitamin D deficiency, unspecified: Secondary | ICD-10-CM

## 2023-11-02 ENCOUNTER — Ambulatory Visit: Payer: Self-pay

## 2023-11-02 ENCOUNTER — Ambulatory Visit: Attending: Internal Medicine | Admitting: Internal Medicine

## 2023-11-02 VITALS — BP 129/86 | HR 97 | Temp 98.1°F | Ht 67.0 in | Wt 249.0 lb

## 2023-11-02 DIAGNOSIS — R3589 Other polyuria: Secondary | ICD-10-CM | POA: Diagnosis not present

## 2023-11-02 DIAGNOSIS — M62838 Other muscle spasm: Secondary | ICD-10-CM | POA: Diagnosis not present

## 2023-11-02 DIAGNOSIS — R7303 Prediabetes: Secondary | ICD-10-CM

## 2023-11-02 LAB — POCT URINALYSIS DIP (CLINITEK)
Bilirubin, UA: NEGATIVE
Blood, UA: NEGATIVE
Glucose, UA: NEGATIVE mg/dL
Ketones, POC UA: NEGATIVE mg/dL
Leukocytes, UA: NEGATIVE
Nitrite, UA: NEGATIVE
Spec Grav, UA: >=1.03 — AB (ref 1.010–1.025)
Urobilinogen, UA: 1 U/dL
pH, UA: 6 (ref 5.0–8.0)

## 2023-11-02 LAB — POCT GLYCOSYLATED HEMOGLOBIN (HGB A1C): HbA1c, POC (prediabetic range): 6.3 % (ref 5.7–6.4)

## 2023-11-02 LAB — GLUCOSE, POCT (MANUAL RESULT ENTRY): POC Glucose: 104 mg/dL — AB (ref 70–99)

## 2023-11-02 NOTE — Progress Notes (Signed)
 Patient ID: Connie Herrera, female    DOB: January 17, 1976  MRN: [id number]  CC: Urinary Frequency (Urinary frequency X3 days - suspects losartan  may be cause/Bilateral leg pain X3 days/)   Subjective: Connie Herrera is a 48 y.o. female who presents for chronic ds management. Her concerns today include:  Pt with hx of HTN, CVA, eczema, IDA, obesity  Discussed the use of AI scribe software for clinical note transcription with the patient, who gave verbal consent to proceed.  History of Present Illness Connie Herrera is a 48 year old female with hypertension who presents with frequent urination and leg spasms.  She has been experiencing frequent urination for the past three days, characterized by an urgent need to urinate at times, but without burning sensation, pain, or discoloration. There is no incontinence, but she notes occasional increased thirst. She attributes her symptoms to change in manufacturer for her Cozaar .  Usually gets her medication filled at a different pharmacy but states that recently her PCP had sent it to our pharmacy downstairs with the new pills being white instead of blue. Since this change, she has noticed increased urination and leg spasms.  She has history of prediabetes.  Her current medications include losartan  and rosuvastatin . She experiences spasms in both legs, particularly when lying in bed and lifting her legs, but not while walking. Her legs feel weak and 'like jelly,' impacting her ability to work. She has not used her prescribed muscle relaxant, cyclobenzaprine , due to concerns about its effects on her work ability.  She works night shift.  She does not feel that the rosuvastatin  is causing the spasms because she has been on the rosuvastatin  for a while and symptoms just started 3 days ago  She has a history of leg pain associated with frequent urination, which she attributes to low potassium levels. She notes that when she urinates frequently,  her legs start to hurt. Bruising on her legs has been occurring intermittently for the past couple of years.    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   Left arm weakness 04/09/2022   Iron deficiency anemia 04/09/2022   Hypokalemia 04/09/2022   Muscle spasms of both lower extremities 08/14/2020   Fibroid uterus 08/14/2020   Anemia due to blood loss, chronic 01/10/2020   Menorrhagia with regular cycle 01/09/2020   Class 1 obesity due to excess calories with body mass index (BMI) of 34.0 to 34.9 in adult 01/09/2020   Screening breast examination 02/07/2019   Breast pain 02/07/2019   Hypertension 01/26/2019   Paresthesias 08/04/2013   Breast pain, right 03/11/2012     Current Outpatient Medications on File Prior to Visit  Medication Sig Dispense Refill   amLODipine  (NORVASC ) 10 MG tablet Take 1 tablet (10 mg total) by mouth daily. 90 tablet 0   aspirin  EC 81 MG tablet Take 1 tablet (81 mg total) by mouth daily. Take daily at 6 am. Swallow whole. 90 tablet 3   Blood Pressure Monitor DEVI Please provide patient with insurance approved blood pressure monitor 1 each 0   ferrous sulfate  325 (65 FE) MG EC tablet Take 1 tablet (325 mg total) by mouth 2 (two) times daily. 180 tablet 3   gabapentin  (NEURONTIN ) 100 MG capsule Take 1-4 capsules (100-400 mg total) by mouth at bedtime as needed. 50 capsule 0   losartan  (COZAAR ) 50 MG tablet Take 1 tablet (  50 mg total) by mouth daily. 90 tablet 1   naloxone  (NARCAN ) nasal spray 4 mg/0.1 mL Use 1 spray every 3 minutes, spray 1 dose into ONE nostril, alternate nostrils with each dose until help arrives 2 each 0   ondansetron  (ZOFRAN ) 8 MG tablet Take 1 tablet (8 mg total) by mouth every 8 (eight) hours as needed for nausea or vomiting. 30 tablet 0   pantoprazole  (PROTONIX ) 40 MG tablet TAKE 1 TABLET BY MOUTH EVERY DAY 90 tablet 1   promethazine   (PHENERGAN ) 12.5 MG tablet Take 1 tablet (12.5 mg total) by mouth every 4 (four) hours as needed for nausea or vomiting. 30 tablet 0   rosuvastatin  (CRESTOR ) 10 MG tablet TAKE 1 TABLET BY MOUTH EVERY DAY 90 tablet 3   triamcinolone  ointment (KENALOG ) 0.1 % Apply topically 2 (two) times daily. 30 g 3   acyclovir  (ZOVIRAX ) 400 MG tablet TAKE 1 TABLET BY MOUTH 3 TIMES DAILY FOR 5 DAYS. (Patient not taking: Reported on 11/02/2023) 15 tablet 1   cyclobenzaprine  (FLEXERIL ) 10 MG tablet Take 1 tablet (10 mg total) by mouth at bedtime as needed. (Patient not taking: Reported on 11/02/2023) 30 tablet 0   oxyCODONE -acetaminophen  (PERCOCET) 10-325 MG tablet Take 1 tablet by mouth 2 (two) times daily as needed. (Patient not taking: Reported on 11/02/2023) 60 tablet 0   [DISCONTINUED] hydrochlorothiazide  (HYDRODIURIL ) 25 MG tablet Take 1 tablet (25 mg total) by mouth daily. Must have office visit for refills 30 tablet 0   No current facility-administered medications on file prior to visit.    No Known Allergies  Social History   Socioeconomic History   Marital status: Single    Spouse name: Not on file   Number of children: 4   Years of education: 14   Highest education level: Some college, no degree  Occupational History   Occupation: Dietary Supervisor    Comment: [employer]  Tobacco Use   Smoking status: Former    Current packs/day: 0.00    Average packs/day: 0.5 packs/day for 4.0 years (2.0 ttl pk-yrs)    Types: Cigarettes    Start date: 06/04/2013    Quit date: 06/04/2017    Years since quitting: 6.4    Passive exposure: Past   Smokeless tobacco: Never  Vaping Use   Vaping status: Never Used  Substance and Sexual Activity   Alcohol use: No   Drug use: Yes    Types: Marijuana    Comment: 09/30/20 maybe once a week   Sexual activity: Yes    Birth control/protection: Surgical  Other Topics Concern   Not on file  Social History Narrative   Patient lives at home with children.     Patient has 4 children.    Patient is single.    Patient has 2 years of college.    Patient is right handed.    Patient is working full time. [employer].    Social Drivers of Corporate investment banker Strain: Not on file  Food Insecurity: No Food Insecurity (04/10/2022)   Hunger Vital Sign    Worried About Running Out of Food in the Last Year: Never true    Ran Out of Food in the Last Year: Never true  Transportation Needs: No Transportation Needs (04/10/2022)   PRAPARE - Administrator, Civil Service (Medical): No    Lack of Transportation (Non-Medical): No  Physical Activity: Not on file  Stress: Not on file  Social Connections: Not on  file  Intimate Partner Violence: Not At Risk (04/10/2022)   Humiliation, Afraid, Rape, and Kick questionnaire    Fear of Current or Ex-Partner: No    Emotionally Abused: No    Physically Abused: No    Sexually Abused: No    Family History  Problem Relation Age of Onset   Healthy Mother    Cirrhosis Father    Cancer Maternal Grandmother 83       breast   Breast cancer Maternal Grandmother     Past Surgical History:  Procedure Laterality Date   AORTOGRAM N/A 04/14/2022   Procedure: DIAGNOSTIC AORTOGRAM;  Surgeon: Lanis Fonda BRAVO, MD;  Location: [facility] OR;  Service: Vascular;  Laterality: N/A;   BUBBLE STUDY  06/15/2022   Procedure: BUBBLE STUDY;  Surgeon: Alvan Ronal BRAVO, MD;  Location: [facility] ENDOSCOPY;  Service: Cardiovascular;;   ENDARTERECTOMY Right 04/14/2022   Procedure: CAROTID CUTDOWN WITH BRACHIOCEPHALIC STENTING, WITH ULTRASOUND OF THE RIGHT FEMORAL VEIN;  Surgeon: Lanis Fonda BRAVO, MD;  Location: [facility] OR;  Service: Vascular;  Laterality: Right;   IR EMBO TUMOR ORGAN ISCHEMIA INFARCT INC GUIDE ROADMAPPING  06/08/2023   IR RADIOLOGIST EVAL & MGMT  05/20/2023   IR RADIOLOGIST EVAL & MGMT  06/22/2023   TEE WITHOUT CARDIOVERSION N/A 06/15/2022   Procedure: TRANSESOPHAGEAL ECHOCARDIOGRAM (TEE);  Surgeon: Alvan Ronal BRAVO, MD;  Location:  [facility] ENDOSCOPY;  Service: Cardiovascular;  Laterality: N/A;   TUBAL LIGATION      ROS: Review of Systems Negative except as stated above  PHYSICAL EXAM: BP 129/86 (BP Location: Left Arm, Patient Position: Sitting, Cuff Size: Large)   Pulse 97   Temp 98.1 F (36.7 C) (Oral)   Ht 5' 7 (1.702 m)   Wt 249 lb (112.9 kg)   LMP 10/11/2023 (Exact Date)   SpO2 97%   BMI 39.00 kg/m   Physical Exam  General appearance - alert, well appearing, middle-age obese African-American female and in no distress Mental status - normal mood, behavior, speech, dress, motor activity, and thought processes Musculoskeletal -legs: No edema or erythema.  Dorsalis pedis, posterior tibialis and popliteal pulses are 3+ bilaterally.  Legs are warm.  Power in the legs are 5/5 bilaterally proximally and distally.  She ambulates without difficulty      Latest Ref Rng & Units 05/17/2023    3:51 PM 12/31/2022   11:16 AM 08/26/2022   12:27 AM  CMP  Glucose 70 - 99 mg/dL 879  884  895   BUN 6 - 24 mg/dL 10  9  13    Creatinine 0.57 - 1.00 mg/dL 9.32  9.23  9.29   Sodium 134 - 144 mmol/L 141  138  136   Potassium 3.5 - 5.2 mmol/L 3.6  3.7  3.5   Chloride 96 - 106 mmol/L 102  102  103   CO2 20 - 29 mmol/L 24  23  22    Calcium  8.7 - 10.2 mg/dL 9.3  9.0  8.8   Total Protein 6.0 - 8.5 g/dL 6.9  6.6  6.8   Total Bilirubin 0.0 - 1.2 mg/dL <9.7  0.2  0.2   Alkaline Phos 44 - 121 IU/L 79  73  56   AST 0 - 40 IU/L 14  17  14    ALT 0 - 32 IU/L 22  24  23     Lipid Panel     Component Value Date/Time   CHOL 138 04/10/2022 0443   CHOL 138 03/07/2019 0831   TRIG 171 (H)  04/10/2022 0443   HDL 36 (L) 04/10/2022 0443   HDL 30 (L) 03/07/2019 0831   CHOLHDL 3.8 04/10/2022 0443   VLDL 34 04/10/2022 0443   LDLCALC 68 04/10/2022 0443   LDLCALC 87 03/07/2019 0831    CBC    Component Value Date/Time   WBC 7.1 10/22/2023 1424   WBC 6.7 12/18/2022 1417   RBC 4.53 10/22/2023 1424   RBC 4.14 12/18/2022 1417   HGB 12.6  10/22/2023 1424   HCT 39.1 10/22/2023 1424   PLT 399 10/22/2023 1424   MCV 86 10/22/2023 1424   MCH 27.8 10/22/2023 1424   MCH 28.7 12/18/2022 1417   MCHC 32.2 10/22/2023 1424   MCHC 33.8 12/18/2022 1417   RDW 13.8 10/22/2023 1424   LYMPHSABS 1.8 10/22/2023 1424   MONOABS 0.4 12/18/2022 1417   EOSABS 0.1 10/22/2023 1424   BASOSABS 0.0 10/22/2023 1424   Results for orders placed or performed in visit on 11/02/23  POCT URINALYSIS DIP (CLINITEK)   Collection Time: 11/02/23  4:42 PM  Result Value Ref Range   Color, UA yellow yellow   Clarity, UA clear clear   Glucose, UA negative negative mg/dL   Bilirubin, UA negative negative   Ketones, POC UA negative negative mg/dL   Spec Grav, UA >=8.969 (A) 1.010 - 1.025   Blood, UA negative negative   pH, UA 6.0 5.0 - 8.0   POC PROTEIN,UA trace negative, trace   Urobilinogen, UA 1.0 0.2 or 1.0 E.U./dL   Nitrite, UA Negative Negative   Leukocytes, UA Negative Negative  POCT glycosylated hemoglobin (Hb A1C)   Collection Time: 11/02/23  4:44 PM  Result Value Ref Range   Hemoglobin A1C     HbA1c POC (<> result, manual entry)     HbA1c, POC (prediabetic range) 6.3 5.7 - 6.4 %   HbA1c, POC (controlled diabetic range)    POCT glucose (manual entry)   Collection Time: 11/02/23  4:44 PM  Result Value Ref Range   POC Glucose 104 (A) 70 - 99 mg/dl     ASSESSMENT AND PLAN: 1. Muscle spasms of both lower extremities (Primary) We will check electrolytes and a CK level.  In the meantime advised that she can use the cyclobenzaprine  as needed.  She can use it prior to her bedtime when she gets off work since the cramps tend to occur mainly at rest.  Advised patient that statins can also cause leg cramps but she does not feel that this is the cause at this time - Basic Metabolic Panel - CK  2. Prediabetes Patient advised to eliminate sugary drinks from the diet, cut back on portion sizes especially of white carbohydrates, eat more white lean meat  like chicken [place] and seafood instead of beef or pork and incorporate fresh fruits and vegetables into the diet daily.  - POCT glycosylated hemoglobin (Hb A1C) - POCT glucose (manual entry)  3. Polyuria UA negative for UTI.  I have not known Losartan  to cause increased urination.  I told the patient that I can send the prescription to the pharmacy where she used to get it before but it is unlikely that her insurance will pay for new prescription on this medication until a month from now due to the recent fill - POCT URINALYSIS DIP (CLINITEK)    Patient was given the opportunity to ask questions.  Patient verbalized understanding of the plan and was able to repeat key elements of the plan.   This documentation was completed  using Paediatric nurse.  Any transcriptional errors are unintentional.  Orders Placed This Encounter  Procedures   Basic Metabolic Panel   CK   POCT URINALYSIS DIP (CLINITEK)   POCT glycosylated hemoglobin (Hb A1C)   POCT glucose (manual entry)     Requested Prescriptions    No prescriptions requested or ordered in this encounter    No follow-ups on file.  Barnie Louder, MD, FACP

## 2023-11-02 NOTE — Patient Instructions (Signed)
 Use the muscle relaxant as prescribed.  Prediabetes: Eating Plan Prediabetes is when your levels of blood sugar, also called glucose, are higher than normal. This can put you at risk for getting type 2 diabetes. When you have prediabetes, making healthy changes can help keep you from getting diabetes. This includes changes in your diet. Work with your health care provider or an expert in healthy eating called a dietitian. They can help you create a healthy eating plan. This plan can help you: Control your blood sugar levels. Improve your cholesterol levels. Manage your blood pressure. What are tips for following this plan? Reading food labels Read food labels to check the amount of fat and sugar in prepackaged foods. Avoid foods that have: Saturated fats. Trans fats. Added sugars. Check food labels for the amount of salt (sodium). Avoid foods that have more than 300 milligrams (mg) of salt per serving. Limit your salt intake to less than 2,300 mg each day. Shopping Avoid buying pre-made and processed foods. Avoid buying drinks with added sugar. Cooking Cook with olive oil. Do not use: Butter. Lard. Ghee. Bake, broil, grill, steam, or boil foods. Avoid frying. Meal planning  Work with your dietitian to create an eating plan that's right for you. This may include tracking how many calories you take in each day. Use a food diary, notebook, or mobile app to track what you eat at each meal. Consider following a Mediterranean diet. This includes: Eating many servings of fresh fruits and vegetables each day. Eating fish at least twice a week. Eating one serving each day of whole grains, beans, nuts, and seeds. Using olive oil instead of other fats. Limiting alcohol. Limiting red meat. Using nonfat or low-fat dairy products. Consider following a plant-based diet. This means eating mostly: Vegetables and fruit. Grains. Beans. Nuts and seeds. If you have high blood pressure, you may  need to limit your salt intake or follow a diet called the DASH eating plan. The DASH eating plan can help lower high blood pressure. Lifestyle Set weight loss goals with help from your health care team. Losing 7% of your body weight is a good goal for most people with prediabetes. Exercise for at least 30 minutes, 5 or more days a week. For support, think about joining a support group or talking with a mental health counselor. Take medicines only as told. What foods are recommended? Fruits Berries. Bananas. Apples. Oranges. Grapes. Papaya. Mango. Pomegranate. Kiwi. Grapefruit. Cherries. Vegetables Lettuce. Spinach. Peas. Beets. Cauliflower. Cabbage. Broccoli. Carrots. Tomatoes. Squash. Eggplant. Herbs. Peppers. Onions. Cucumbers. Brussels sprouts. Grains Whole grains, such as whole-wheat or whole-grain breads or pasta. Unsweetened oatmeal. Bulgur. Barley. Quinoa. Brown rice. Corn or whole-wheat flour tortillas or taco shells. Meats and other proteins Seafood. Poultry without skin. Lean cuts of pork and beef. Tofu. Eggs. Nuts. Beans. Dairy Low-fat or fat-free dairy products, such as yogurt, cottage cheese, and cheese. Beverages Water . Tea. Coffee. Sugar-free or diet soda. Seltzer water . Low-fat or nonfat milk. Milk alternatives, such as soy or almond milk. Fats and oils Olive oil. Canola oil. Sunflower oil. Grapeseed oil. Avocado. Walnuts. Sweets and desserts Sugar-free or low-fat pudding. Sugar-free or low-fat ice cream and other frozen treats. Seasonings and condiments Herbs. Salt-free spices. Mustard. Relish. Low-salt, low-sugar ketchup. Low-salt, low-sugar barbecue sauce. Low-fat or fat-free mayonnaise. The items listed above may not be all the foods and drinks you can have. Talk with a dietitian to learn more. What foods are not recommended? Fruits Fruits canned with syrup. Vegetables  Canned vegetables. Frozen vegetables with butter or cream sauce. Grains Refined white flour and  flour products, such as bread, pasta, snack foods, and cereals. Meats and other proteins Fatty cuts of meat. Poultry with skin. Breaded or fried meat. Processed meats. Dairy Full-fat yogurt, cheese, or milk. Beverages Sweetened drinks, such as iced tea and soda. Fats and oils Butter. Lard. Ghee. Sweets and desserts Baked goods, such as cake, cupcakes, pastries, cookies, and cheesecake. Seasonings and condiments Spice mixes with added salt. Ketchup. Barbecue sauce. Mayonnaise. The items listed above may not be all the foods and drinks you should avoid. Talk with a dietitian to learn more. Where to find more information American Diabetes Association: diabetes.org/food-nutrition This information is not intended to replace advice given to you by your health care provider. Make sure you discuss any questions you have with your health care provider. Document Revised: 10/25/2022 Document Reviewed: 10/25/2022 Elsevier Patient Education  2024 [employer].

## 2023-11-02 NOTE — Telephone Encounter (Signed)
 FYI Only or Action Required?: Action required by provider: clinical question for provider.  Patient was last seen in primary care on 10/22/2023 by Theotis Haze ORN, NP.  Called Nurse Triage reporting Urinary Frequency.  Symptoms began at age x3 days.  Interventions attempted: Nothing.  Symptoms are: gradually worsening.  Triage Disposition: Call PCP Within 24 Hours  Patient/caregiver understands and will follow disposition?: Yes     Copied from CRM #[id number]. Topic: Clinical - Red Word Triage >> Nov 02, 2023  2:07 PM Charlet HERO wrote: Red Word that prompted transfer to Nurse Triage: Patient is calling about losartan  (COZAAR ) 50 MG tablet she is stating that the pharmacy she got last from the pill is a different color than what she is use to getting and she has been urinating more frequently and now her legs  are hurting Reason for Disposition  [1] MODERATE pain (e.g., interferes with normal activities, limping) AND [2] present > 3 days  [1] Taking medicine for urinary symptoms (e.g., incontinence, overactive bladder) AND [2] feels is having side effects (e.g., dry mouth, GI symptoms, difficulty urinating)  Answer Assessment - Initial Assessment Questions 1. SYMPTOM: What's the main symptom you're concerned about? (e.g., frequency, incontinence)     Increase in frequency 2. ONSET: When did the    start?     X 3 day 3. PAIN: Is there any pain? If Yes, ask: How bad is it? (Scale: 1-10; mild, moderate, severe)     10/10 can barely walk 4. CAUSE: What do you think is causing the symptoms?     na 5. OTHER SYMPTOMS: Do you have any other symptoms? (e.g., blood in urine, fever, flank pain, pain with urination)     no 6. PREGNANCY: Is there any chance you are pregnant? When was your last menstrual period?     na Bilateral leg pain and spasms.  Patient is unsure if the new BP Rx (COZAAR ) 50 MG tablet.  Legs cramping: pt states increase in urination occurs with taking BP  meds  Answer Assessment - Initial Assessment Questions 1. ONSET: When did the pain start?      X 3 days 2. LOCATION: Where is the pain located?      Bilateral leg 3. PAIN: How bad is the pain?    (Scale 1-10; or mild, moderate, severe)     10/10  4. WORK OR EXERCISE: Has there been any recent work or exercise that involved this part of the body?      no 5. CAUSE: What do you think is causing the leg pain?     Pt thinks she maybe taking the wrong medication & wonders if this is causing a medication reaction resulting in pain 6. OTHER SYMPTOMS: Do you have any other symptoms? (e.g., chest pain, back pain, breathing difficulty, swelling, rash, fever, numbness, weakness)     Increase in urinating, pain in fingertips 7. PREGNANCY: Is there any chance you are pregnant? When was your last menstrual period?     Na  Pt is concerned she may have been prescribed wrong medication of losartan : pt stated she remembers taking the kind with potassium in it & the new medication does not include potassium.  Pt would like to this information routed to PCP to review & call pt back to update on this matter.  Protocols used: Urinary Symptoms-A-AH, Leg Pain-A-AH

## 2023-11-03 ENCOUNTER — Ambulatory Visit: Payer: Self-pay | Admitting: Internal Medicine

## 2023-11-03 LAB — BASIC METABOLIC PANEL WITH GFR
BUN/Creatinine Ratio: 12 (ref 9–23)
BUN: 13 mg/dL (ref 6–24)
CO2: 23 mmol/L (ref 20–29)
Calcium: 9.4 mg/dL (ref 8.7–10.2)
Chloride: 103 mmol/L (ref 96–106)
Creatinine, Ser: 1.06 mg/dL — ABNORMAL HIGH (ref 0.57–1.00)
Glucose: 82 mg/dL (ref 70–99)
Potassium: 4.3 mmol/L (ref 3.5–5.2)
Sodium: 140 mmol/L (ref 134–144)
eGFR: 65 mL/min/1.73 (ref >59)

## 2023-11-03 LAB — CK: Total CK: 112 U/L (ref 32–182)

## 2023-11-04 NOTE — Telephone Encounter (Signed)
 It looks like she has seen Dr. Vicci for this already

## 2023-11-05 NOTE — Telephone Encounter (Signed)
 Nothed. Patient has been seen by provider for this already.

## 2023-11-13 ENCOUNTER — Ambulatory Visit: Payer: Self-pay | Admitting: Nurse Practitioner

## 2023-11-15 ENCOUNTER — Telehealth: Admitting: Physician Assistant

## 2023-11-15 ENCOUNTER — Encounter

## 2023-11-15 DIAGNOSIS — N760 Acute vaginitis: Secondary | ICD-10-CM

## 2023-11-15 DIAGNOSIS — B9689 Other specified bacterial agents as the cause of diseases classified elsewhere: Secondary | ICD-10-CM | POA: Diagnosis not present

## 2023-11-15 MED ORDER — METRONIDAZOLE 500 MG PO TABS
500.0000 mg | ORAL_TABLET | Freq: Two times a day (BID) | ORAL | 0 refills | Status: AC
Start: 2023-11-15 — End: 2023-11-22

## 2023-11-15 NOTE — Progress Notes (Signed)
 E-Visit for Vaginal Symptoms  We are sorry that you are not feeling well. Here is how we plan to help! Based on what you shared with me it looks like you: May have a vaginosis due to bacteria  Vaginosis is an inflammation of the vagina that can result in discharge, itching and pain. The cause is usually a change in the normal balance of vaginal bacteria or an infection. Vaginosis can also result from reduced estrogen levels after menopause.  The most common causes of vaginosis are:   Bacterial vaginosis which results from an overgrowth of one on several organisms that are normally present in your vagina.   Yeast infections which are caused by a naturally occurring fungus called candida.   Vaginal atrophy (atrophic vaginosis) which results from the thinning of the vagina from reduced estrogen levels after menopause.   Trichomoniasis which is caused by a parasite and is commonly transmitted by sexual intercourse.  Factors that increase your risk of developing vaginosis include: Medications, such as antibiotics and steroids Uncontrolled diabetes Use of hygiene products such as bubble bath, vaginal spray or vaginal deodorant Douching Wearing damp or tight-fitting clothing Using an intrauterine device (IUD) for birth control Hormonal changes, such as those associated with pregnancy, birth control pills or menopause Sexual activity Having a sexually transmitted infection  Your treatment plan is Metronidazole or Flagyl 500mg  twice a day for 7 days.  I have electronically sent this prescription into the pharmacy that you have chosen.  Be sure to take all of the medication as directed. Stop taking any medication if you develop a rash, tongue swelling or shortness of breath. Mothers who are breast feeding should consider pumping and discarding their breast milk while on these antibiotics. However, there is no consensus that infant exposure at these doses would be harmful.  Remember that  medication creams can weaken latex condoms. SABRA   HOME CARE:  Good hygiene may prevent some types of vaginosis from recurring and may relieve some symptoms:  Avoid baths, hot tubs and whirlpool spas. Rinse soap from your outer genital area after a shower, and dry the area well to prevent irritation. Don't use scented or harsh soaps, such as those with deodorant or antibacterial action. Avoid irritants. These include scented tampons and pads. Wipe from front to back after using the toilet. Doing so avoids spreading fecal bacteria to your vagina.  Other things that may help prevent vaginosis include:  Don't douche. Your vagina doesn't require cleansing other than normal bathing. Repetitive douching disrupts the normal organisms that reside in the vagina and can actually increase your risk of vaginal infection. Douching won't clear up a vaginal infection. Use a latex condom. Both female and female latex condoms may help you avoid infections spread by sexual contact. Wear cotton underwear. Also wear pantyhose with a cotton crotch. If you feel comfortable without it, skip wearing underwear to bed. Yeast thrives in [employer] Your symptoms should improve in the next day or two.  GET HELP RIGHT AWAY IF:  You have pain in your lower abdomen ( pelvic area or over your ovaries) You develop nausea or vomiting You develop a fever Your discharge changes or worsens You have persistent pain with intercourse You develop shortness of breath, a rapid pulse, or you faint.  These symptoms could be signs of problems or infections that need to be evaluated by a medical provider now.  MAKE SURE YOU   Understand these instructions. Will watch your condition. Will get help right  away if you are not doing well or get worse.  Thank you for choosing an e-visit.  Your e-visit answers were reviewed by a board certified advanced clinical practitioner to complete your personal care plan. Depending upon the  condition, your plan could have included both over the counter or prescription medications.  Please review your pharmacy choice. Make sure the pharmacy is open so you can pick up prescription now. If there is a problem, you may contact your provider through [employer] and have the prescription routed to another pharmacy.  Your safety is important to us . If you have drug allergies check your prescription carefully.   For the next 24 hours you can use MyChart to ask questions about today's visit, request a non-urgent call back, or ask for a work or school excuse. You will get an email in the next two days asking about your experience. I hope that your e-visit has been valuable and will speed your recovery.  I have spent 5 minutes in review of e-visit questionnaire, review and updating patient chart, medical decision making and response to patient.   Delon CHRISTELLA Dickinson, PA-C

## 2023-11-26 ENCOUNTER — Other Ambulatory Visit: Payer: Self-pay

## 2023-11-26 DIAGNOSIS — I6529 Occlusion and stenosis of unspecified carotid artery: Secondary | ICD-10-CM

## 2023-12-05 ENCOUNTER — Emergency Department (HOSPITAL_COMMUNITY)

## 2023-12-05 ENCOUNTER — Other Ambulatory Visit: Payer: Self-pay

## 2023-12-05 ENCOUNTER — Emergency Department (HOSPITAL_COMMUNITY)
Admission: EM | Admit: 2023-12-05 | Discharge: 2023-12-05 | Disposition: A | Discharge status: Home / Self Care | Attending: Emergency Medicine | Admitting: Emergency Medicine

## 2023-12-05 ENCOUNTER — Encounter (HOSPITAL_COMMUNITY): Payer: Self-pay

## 2023-12-05 DIAGNOSIS — Z7982 Long term (current) use of aspirin: Secondary | ICD-10-CM | POA: Diagnosis not present

## 2023-12-05 DIAGNOSIS — R109 Unspecified abdominal pain: Secondary | ICD-10-CM | POA: Diagnosis present

## 2023-12-05 DIAGNOSIS — R1030 Lower abdominal pain, unspecified: Secondary | ICD-10-CM | POA: Insufficient documentation

## 2023-12-05 LAB — URINALYSIS, ROUTINE W REFLEX MICROSCOPIC
Bacteria, UA: NONE SEEN
Bilirubin Urine: NEGATIVE
Glucose, UA: NEGATIVE mg/dL
Ketones, ur: NEGATIVE mg/dL
Leukocytes,Ua: NEGATIVE
Nitrite: NEGATIVE
Protein, ur: NEGATIVE mg/dL
Specific Gravity, Urine: 1.014 (ref 1.005–1.030)
pH: 6 (ref 5.0–8.0)

## 2023-12-05 LAB — COMPREHENSIVE METABOLIC PANEL WITH GFR
ALT: 24 U/L (ref 0–44)
AST: 18 U/L (ref 15–41)
Albumin: 3.6 g/dL (ref 3.5–5.0)
Alkaline Phosphatase: 65 U/L (ref 38–126)
Anion gap: 10 (ref 5–15)
BUN: 11 mg/dL (ref 6–20)
CO2: 22 mmol/L (ref 22–32)
Calcium: 8.7 mg/dL — ABNORMAL LOW (ref 8.9–10.3)
Chloride: 106 mmol/L (ref 98–111)
Creatinine, Ser: 0.71 mg/dL (ref 0.44–1.00)
GFR, Estimated: >60 mL/min (ref >60)
Glucose, Bld: 110 mg/dL — ABNORMAL HIGH (ref 70–99)
Potassium: 3.8 mmol/L (ref 3.5–5.1)
Sodium: 138 mmol/L (ref 135–145)
Total Bilirubin: 0.4 mg/dL (ref 0.0–1.2)
Total Protein: 6.9 g/dL (ref 6.5–8.1)

## 2023-12-05 LAB — CBC
HCT: 39.8 % (ref 36.0–46.0)
Hemoglobin: 12.8 g/dL (ref 12.0–15.0)
MCH: 27.7 pg (ref 26.0–34.0)
MCHC: 32.2 g/dL (ref 30.0–36.0)
MCV: 86.1 fL (ref 80.0–100.0)
Platelets: 392 K/uL (ref 150–400)
RBC: 4.62 MIL/uL (ref 3.87–5.11)
RDW: 13 % (ref 11.5–15.5)
WBC: 7.8 K/uL (ref 4.0–10.5)
nRBC: 0 % (ref 0.0–0.2)

## 2023-12-05 LAB — LIPASE, BLOOD: Lipase: 37 U/L (ref 11–51)

## 2023-12-05 LAB — HCG, SERUM, QUALITATIVE: Preg, Serum: NEGATIVE

## 2023-12-05 MED ORDER — MORPHINE SULFATE (PF) 4 MG/ML IV SOLN
4.0000 mg | Freq: Once | INTRAVENOUS | Status: AC
  Administered 2023-12-05: 4 mg via INTRAVENOUS
  Filled 2023-12-05: qty 1

## 2023-12-05 MED ORDER — IOHEXOL 350 MG/ML SOLN
75.0000 mL | Freq: Once | INTRAVENOUS | Status: AC | PRN
  Administered 2023-12-05: 75 mL via INTRAVENOUS

## 2023-12-05 MED ORDER — ONDANSETRON HCL 4 MG/2ML IJ SOLN
4.0000 mg | Freq: Once | INTRAMUSCULAR | Status: AC
  Administered 2023-12-05: 4 mg via INTRAVENOUS
  Filled 2023-12-05: qty 2

## 2023-12-05 NOTE — ED Triage Notes (Signed)
 Pt c/o lower abdominal pain that started this morning. Pt's LMP stopped yesterday. Pt denies nausea, vomiting, diarrhea or constipation. Pt denies urinary symptoms or discharge.

## 2023-12-05 NOTE — Discharge Instructions (Addendum)
 Follow-up with your primary care provider if your symptoms persist, return to the emergency department if your symptoms worsen.

## 2023-12-05 NOTE — ED Provider Notes (Signed)
 [place] EMERGENCY DEPARTMENT AT [facility] Provider Note   CSN: [id number] Arrival date & time: 12/05/23  9160     Patient presents with: Abdominal Pain   Connie Herrera is a 48 y.o. female.   48 year old female presenting with abdominal pain.  Symptoms began this morning, patient describes this as a pressure sensation that is constant, pain is worse with ambulating I feel you have to hold onto something because of the pain.  She has not tried anything for relief of her symptoms.  She denies nausea/vomiting, chest pain/shortness of breath, worsening lower extremity edema, dysuria/hematuria.  History of tubal ligation, no other abdominal surgeries.  6 months ago she underwent uterine artery embolization for fibroid uterus, her LMP ended yesterday but she believes she is still having some mild vaginal spotting, no change in vaginal discharge/odor.   Abdominal Pain      Prior to Admission medications   Medication Sig Start Date End Date Taking? Authorizing Provider  acyclovir  (ZOVIRAX ) 400 MG tablet TAKE 1 TABLET BY MOUTH 3 TIMES DAILY FOR 5 DAYS. Patient not taking: Reported on 11/02/2023 09/01/23   Fleming, Zelda W, NP  amLODipine  (NORVASC ) 10 MG tablet Take 1 tablet (10 mg total) by mouth daily. 09/08/23   Lelon Hamilton T, PA-C  aspirin  EC 81 MG tablet Take 1 tablet (81 mg total) by mouth daily. Take daily at 6 am. Swallow whole. 10/22/23   Theotis Haze ORN, NP  Blood Pressure Monitor DEVI Please provide patient with insurance approved blood pressure monitor 12/23/22   Theotis Haze ORN, NP  cyclobenzaprine  (FLEXERIL ) 10 MG tablet Take 1 tablet (10 mg total) by mouth at bedtime as needed. Patient not taking: Reported on 11/02/2023 07/30/23     ferrous sulfate  325 (65 FE) MG EC tablet Take 1 tablet (325 mg total) by mouth 2 (two) times daily. 10/22/23   Fleming, Zelda W, NP  gabapentin  (NEURONTIN ) 100 MG capsule Take 1-4 capsules (100-400 mg total) by mouth at bedtime  as needed. 11/27/22   Leonce Katz, DO  losartan  (COZAAR ) 50 MG tablet Take 1 tablet (50 mg total) by mouth daily. 10/22/23   Fleming, Zelda W, NP  naloxone  (NARCAN ) nasal spray 4 mg/0.1 mL Use 1 spray every 3 minutes, spray 1 dose into ONE nostril, alternate nostrils with each dose until help arrives 07/30/23     ondansetron  (ZOFRAN ) 8 MG tablet Take 1 tablet (8 mg total) by mouth every 8 (eight) hours as needed for nausea or vomiting. 06/07/23   Karalee Wilkie POUR, MD  oxyCODONE -acetaminophen  (PERCOCET) 10-325 MG tablet Take 1 tablet by mouth 2 (two) times daily as needed. Patient not taking: Reported on 11/02/2023 07/30/23     pantoprazole  (PROTONIX ) 40 MG tablet TAKE 1 TABLET BY MOUTH EVERY DAY 05/17/23   Fleming, Zelda W, NP  promethazine  (PHENERGAN ) 12.5 MG tablet Take 1 tablet (12.5 mg total) by mouth every 4 (four) hours as needed for nausea or vomiting. 06/07/23   Karalee Wilkie POUR, MD  rosuvastatin  (CRESTOR ) 10 MG tablet TAKE 1 TABLET BY MOUTH EVERY DAY 07/05/23   Fleming, Zelda W, NP  triamcinolone  ointment (KENALOG ) 0.1 % Apply topically 2 (two) times daily. 10/22/23   Fleming, Zelda W, NP  hydrochlorothiazide  (HYDRODIURIL ) 25 MG tablet Take 1 tablet (25 mg total) by mouth daily. Must have office visit for refills 06/20/20 08/13/20  Gladis Elsie BROCKS, PA-C    Allergies: Patient has no known allergies.    Review of Systems  Gastrointestinal:  Positive for abdominal pain.    Updated Vital Signs  Vitals:   12/05/23 0842 12/05/23 0854 12/05/23 1015  BP: (!) 139/93  138/89  Pulse: 91  75  Resp: 18  (!) 22  Temp: 98.1 F (36.7 C)    SpO2: 95%  95%  Weight:  108.9 kg   Height:  5' 7 (1.702 m)      Physical Exam Vitals and nursing note reviewed.  HENT:     Head: Normocephalic.  Eyes:     Extraocular Movements: Extraocular movements intact.  Cardiovascular:     Rate and Rhythm: Normal rate and regular rhythm.  Pulmonary:     Effort: Pulmonary effort is normal.     Breath  sounds: Normal breath sounds.  Abdominal:     Palpations: Abdomen is soft.     Tenderness: There is abdominal tenderness (mild suprapubic). There is no right CVA tenderness, left CVA tenderness or guarding.  Musculoskeletal:     Cervical back: Normal range of motion.     Right lower leg: No edema.     Left lower leg: No edema.     Comments: Moves all extremities spontaneously without difficulty  Skin:    General: Skin is warm and dry.  Neurological:     Mental Status: She is alert and oriented to person, place, and time.     (all labs ordered are listed, but only abnormal results are displayed) Labs Reviewed  COMPREHENSIVE METABOLIC PANEL WITH GFR - Abnormal; Notable for the following components:      Result Value   Glucose, Bld 110 (*)    Calcium  8.7 (*)    All other components within normal limits  URINALYSIS, ROUTINE W REFLEX MICROSCOPIC - Abnormal; Notable for the following components:   Hgb urine dipstick MODERATE (*)    All other components within normal limits  LIPASE, BLOOD  CBC  HCG, SERUM, QUALITATIVE    EKG: None  Radiology: CT ABDOMEN PELVIS W CONTRAST Result Date: 12/05/2023 EXAM: CT ABDOMEN AND PELVIS WITH CONTRAST 12/05/2023 11:00:09 AM TECHNIQUE: CT of the abdomen and pelvis was performed with the administration of intravenous contrast. Multiplanar reformatted images are provided for review. Automated exposure control, iterative reconstruction, and/or weight-based adjustment of the mA/kV was utilized to reduce the radiation dose to as low as reasonably achievable. COMPARISON: MR of the pelvis 05/06/2023 status post uterine fibroid embolization 06/08/2023. CLINICAL HISTORY: Suprapubic abdominal pain. FINDINGS: LOWER CHEST: Visualized lung bases are clear. LIVER: Mild fatty infiltration of the liver is present. No discrete lesions are present. GALLBLADDER AND BILE DUCTS: No wall thickening. No cholelithiasis. No biliary ductal dilatation. SPLEEN: Normal size. No  focal lesion. PANCREAS: No mass. No ductal dilatation. ADRENAL GLANDS: Normal appearance. No mass. KIDNEYS, URETERS AND BLADDER: No stones in the kidneys or ureters. No hydronephrosis. No perinephric or periureteral stranding. Urinary bladder is unremarkable. GI AND BOWEL: Stomach demonstrates no acute abnormality. There is no bowel obstruction. No bowel wall thickening. PERITONEUM AND RETROPERITONEUM: No ascites. No free air. VASCULATURE: Aorta is normal in caliber. LYMPH NODES: No lymphadenopathy. REPRODUCTIVE ORGANS: A dominant uterine fibroid has decreased in size since the prior study. It measures 7.8 x 8.2 cm on sagittal images compared with 9.7 x 8.6 cm previously. Other smaller fibroids have also decreased in size. No definite new or enlarging fibroids are present. BONES AND SOFT TISSUES: No acute osseous abnormality. No focal soft tissue abnormality. IMPRESSION: 1. No acute findings. 2. Status post uterine fibroid embolization with decreased size of  the dominant fibroid and other smaller fibroids. No definite new or enlarging fibroids. Electronically signed by: Lonni Necessary MD 12/05/2023 11:11 AM EDT RP Workstation: [id number]     Procedures   Medications Ordered in the ED  morphine  (PF) 4 MG/ML injection 4 mg (4 mg Intravenous Given 12/05/23 1028)  ondansetron  (ZOFRAN ) injection 4 mg (4 mg Intravenous Given 12/05/23 1028)  iohexol  (OMNIPAQUE ) 350 MG/ML injection 75 mL (75 mLs Intravenous Contrast Given 12/05/23 1101)                                    Medical Decision Making This patient presents to the ED for concern of abdominal pain, this involves an extensive number of treatment options, and is a complaint that carries with it a high risk of complications and morbidity.  The differential diagnosis includes gastroenteritis, diverticulitis, appendicitis, pancreatitis, cholecystitis   Co morbidities that complicate the patient evaluation  History of CVA and arterial thrombosis,  not on blood thinners   Additional history obtained:  Additional history obtained from record review External records from outside source obtained and reviewed including most recent PCP note, hospital discharge summary from [facility] in 2024   Lab Tests:  I Ordered, and personally interpreted labs.  The pertinent results include: CBC within normal limits, no leukocytosis.  Urinalysis notable for moderate RBCs, no signs of infection.  CMP with borderline hypocalcemia, otherwise unremarkable.  Lipase within normal limits.  Serum hCG negative.   Imaging Studies ordered:  I ordered imaging studies including CT abdomen/pelvis with contrast I independently visualized and interpreted imaging which showed 1. No acute findings. 2. Status post uterine fibroid embolization with decreased size of the dominant fibroid and other smaller fibroids. No definite new or enlarging fibroids.  I agree with the radiologist interpretation   Cardiac Monitoring: / EKG:  The patient was maintained on a cardiac monitor.  I personally viewed and interpreted the cardiac monitored which showed an underlying rhythm of: NSR   Problem List / ED Course / Critical interventions / Medication management  I ordered medication including morphine  and zofran   for pain/nausea  Reevaluation of the patient after these medicines showed that the patient improved I have reviewed the patients home medicines and have made adjustments as needed   Social Determinants of Health:  Former tobacco use, depression   Test / Admission - Considered:  Physical exam is notable as above.  Will proceed with CT imaging given abdominal tenderness to palpation, with pain worse on ambulation.  See above for lab/CT imaging results.  Workup is reassuring, there is no obvious underlying acute/infectious etiology that is contributing to her symptoms today.  She received morphine /Zofran  for pain, symptoms have improved.  She is well-appearing and in no  acute distress throughout her stay in the emergency department today.  I advised her to follow-up with her primary care provider if her symptoms persist, return precautions discussed.  She is in agreement with this plan and is appropriate for discharge at this time.    Amount and/or Complexity of Data Reviewed Labs: ordered. Radiology: ordered.  Risk Prescription drug management.        Final diagnoses:  Abdominal pain, unspecified abdominal location    ED Discharge Orders     None          Connie Herrera 12/05/23 1149    Bernard Drivers, MD 12/06/23 1315

## 2023-12-08 ENCOUNTER — Other Ambulatory Visit: Payer: Self-pay | Admitting: Nurse Practitioner

## 2023-12-08 DIAGNOSIS — B009 Herpesviral infection, unspecified: Secondary | ICD-10-CM

## 2023-12-08 NOTE — Telephone Encounter (Signed)
 Requested medication (s) are due for refill today: yes  Requested medication (s) are on the active medication list: yes  Last refill:  09/01/23  Future visit scheduled: no  Notes to clinic:  Patient not taking: Reported on 11/02/2023,routing for review     Requested Prescriptions  Pending Prescriptions Disp Refills   acyclovir  (ZOVIRAX ) 400 MG tablet [Pharmacy Med Name: ACYCLOVIR  400 MG TABLET] 15 tablet 1    Sig: TAKE 1 TABLET BY MOUTH 3 TIMES DAILY FOR 5 DAYS.     Antimicrobials:  Antiviral Agents - Anti-Herpetic Passed - 12/08/2023  4:02 PM      Passed - Valid encounter within last 12 months    Recent Outpatient Visits           1 month ago Muscle spasms of both lower extremities   [facility] [place]. [facility] Vicci Barnie NOVAK, MD   1 month ago Primary hypertension   [facility] Of [place]. [facility] Theotis Haze ORN, NP   6 months ago Primary hypertension   [facility] Of [place]. [facility] Theotis Haze ORN, NP   9 months ago Submucous leiomyoma of uterus   [facility] Of [place]. [facility] Theotis Haze ORN, NP   10 months ago Primary hypertension   [facility] Dept Of [place]. [facility] Fleeta Tonia Garnette LITTIE, RPH-CPP

## 2023-12-14 ENCOUNTER — Encounter: Payer: Self-pay | Admitting: Gastroenterology

## 2023-12-16 ENCOUNTER — Other Ambulatory Visit: Payer: Self-pay | Admitting: Physician Assistant

## 2023-12-16 DIAGNOSIS — I10 Essential (primary) hypertension: Secondary | ICD-10-CM

## 2023-12-21 NOTE — Progress Notes (Unsigned)
 Office Note    HPI: Connie Herrera is a 48 y.o. (01/21/1976) female presenting s/p right carotid cutdown innominate artery stenting for acute thrombus.  On exam today, continue was doing well.  She had no complaints.  She denied TIA, stroke, amaurosis.  She denies right upper extremity weakness, sensorimotor deficits.  The pt is not on a statin for cholesterol management.  The pt is  on a daily aspirin .   Other AC:  none The pt is  on medication for hypertension.   The pt is not diabetic.  Tobacco hx:  -  Past Medical History:  Diagnosis Date   Brain lesion    COVID-19 04/2020   CVA (cerebral vascular accident) (HCC) 04/2022   Diplopia    Hypertension    Iron deficiency anemia     Past Surgical History:  Procedure Laterality Date   AORTOGRAM N/A 04/14/2022   Procedure: DIAGNOSTIC AORTOGRAM;  Surgeon: Lanis Fonda BRAVO, MD;  Location: [facility] OR;  Service: Vascular;  Laterality: N/A;   BUBBLE STUDY  06/15/2022   Procedure: BUBBLE STUDY;  Surgeon: Alvan Ronal BRAVO, MD;  Location: [facility] ENDOSCOPY;  Service: Cardiovascular;;   ENDARTERECTOMY Right 04/14/2022   Procedure: CAROTID CUTDOWN WITH BRACHIOCEPHALIC STENTING, WITH ULTRASOUND OF THE RIGHT FEMORAL VEIN;  Surgeon: Lanis Fonda BRAVO, MD;  Location: [facility] OR;  Service: Vascular;  Laterality: Right;   IR EMBO TUMOR ORGAN ISCHEMIA INFARCT INC GUIDE ROADMAPPING  06/08/2023   IR RADIOLOGIST EVAL & MGMT  05/20/2023   IR RADIOLOGIST EVAL & MGMT  06/22/2023   TEE WITHOUT CARDIOVERSION N/A 06/15/2022   Procedure: TRANSESOPHAGEAL ECHOCARDIOGRAM (TEE);  Surgeon: Alvan Ronal BRAVO, MD;  Location: [facility] ENDOSCOPY;  Service: Cardiovascular;  Laterality: N/A;   TUBAL LIGATION      Social History   Socioeconomic History   Marital status: Single    Spouse name: Not on file   Number of children: 4   Years of education: 14   Highest education level: Some college, no degree  Occupational History   Occupation: Dietary Supervisor    Comment: [employer]   Tobacco Use   Smoking status: Former    Current packs/day: 0.00    Average packs/day: 0.5 packs/day for 4.0 years (2.0 ttl pk-yrs)    Types: Cigarettes    Start date: 06/04/2013    Quit date: 06/04/2017    Years since quitting: 6.5    Passive exposure: Past   Smokeless tobacco: Never  Vaping Use   Vaping status: Never Used  Substance and Sexual Activity   Alcohol use: No   Drug use: Yes    Types: Marijuana    Comment: 09/30/20 maybe once a week   Sexual activity: Yes    Birth control/protection: Surgical  Other Topics Concern   Not on file  Social History Narrative   Patient lives at home with children.    Patient has 4 children.    Patient is single.    Patient has 2 years of college.    Patient is right handed.    Patient is working full time. [employer].    Social Drivers of Corporate investment banker Strain: Not on file  Food Insecurity: No Food Insecurity (04/10/2022)   Hunger Vital Sign    Worried About Running Out of Food in the Last Year: Never true    Ran Out of Food in the Last Year: Never true  Transportation Needs: No Transportation Needs (04/10/2022)   PRAPARE - Transportation  Lack of Transportation (Medical): No    Lack of Transportation (Non-Medical): No  Physical Activity: Not on file  Stress: Not on file  Social Connections: Not on file  Intimate Partner Violence: Not At Risk (04/10/2022)   Humiliation, Afraid, Rape, and Kick questionnaire    Fear of Current or Ex-Partner: No    Emotionally Abused: No    Physically Abused: No    Sexually Abused: No   Family History  Problem Relation Age of Onset   Healthy Mother    Cirrhosis Father    Cancer Maternal Grandmother 1       breast   Breast cancer Maternal Grandmother     Current Outpatient Medications  Medication Sig Dispense Refill   acyclovir  (ZOVIRAX ) 400 MG tablet TAKE 1 TABLET BY MOUTH 3 TIMES DAILY FOR 5 DAYS. 15 tablet 1   amLODipine  (NORVASC ) 10 MG tablet Take 1 tablet (10 mg  total) by mouth daily. Please call [phone] to schedule an appointment with a new cardiologist for future refills. Thank you. 2nd attempt. 15 tablet 0   aspirin  EC 81 MG tablet Take 1 tablet (81 mg total) by mouth daily. Take daily at 6 am. Swallow whole. 90 tablet 3   Blood Pressure Monitor DEVI Please provide patient with insurance approved blood pressure monitor 1 each 0   cyclobenzaprine  (FLEXERIL ) 10 MG tablet Take 1 tablet (10 mg total) by mouth at bedtime as needed. (Patient not taking: Reported on 11/02/2023) 30 tablet 0   ferrous sulfate  325 (65 FE) MG EC tablet Take 1 tablet (325 mg total) by mouth 2 (two) times daily. 180 tablet 3   gabapentin  (NEURONTIN ) 100 MG capsule Take 1-4 capsules (100-400 mg total) by mouth at bedtime as needed. 50 capsule 0   losartan  (COZAAR ) 50 MG tablet Take 1 tablet (50 mg total) by mouth daily. 90 tablet 1   naloxone  (NARCAN ) nasal spray 4 mg/0.1 mL Use 1 spray every 3 minutes, spray 1 dose into ONE nostril, alternate nostrils with each dose until help arrives 2 each 0   ondansetron  (ZOFRAN ) 8 MG tablet Take 1 tablet (8 mg total) by mouth every 8 (eight) hours as needed for nausea or vomiting. 30 tablet 0   oxyCODONE -acetaminophen  (PERCOCET) 10-325 MG tablet Take 1 tablet by mouth 2 (two) times daily as needed. (Patient not taking: Reported on 11/02/2023) 60 tablet 0   pantoprazole  (PROTONIX ) 40 MG tablet TAKE 1 TABLET BY MOUTH EVERY DAY 90 tablet 1   promethazine  (PHENERGAN ) 12.5 MG tablet Take 1 tablet (12.5 mg total) by mouth every 4 (four) hours as needed for nausea or vomiting. 30 tablet 0   rosuvastatin  (CRESTOR ) 10 MG tablet TAKE 1 TABLET BY MOUTH EVERY DAY 90 tablet 3   triamcinolone  ointment (KENALOG ) 0.1 % Apply topically 2 (two) times daily. 30 g 3   No current facility-administered medications for this visit.    No Known Allergies   REVIEW OF SYSTEMS:  [X]  denotes positive finding, [ ]  denotes negative finding Cardiac  Comments:  Chest  pain or chest pressure:    Shortness of breath upon exertion:    Short of breath when lying flat:    Irregular heart rhythm:        Vascular    Pain in calf, thigh, or hip brought on by ambulation:    Pain in feet at night that wakes you up from your sleep:     Blood clot in your veins:    Leg swelling:  Pulmonary    Oxygen at home:    Productive cough:     Wheezing:         Neurologic    Sudden weakness in arms or legs:     Sudden numbness in arms or legs:     Sudden onset of difficulty speaking or slurred speech:    Temporary loss of vision in one eye:     Problems with dizziness:         Gastrointestinal    Blood in stool:     Vomited blood:         Genitourinary    Burning when urinating:     Blood in urine:        Psychiatric    Major depression:         Hematologic    Bleeding problems:    Problems with blood clotting too easily:        Skin    Rashes or ulcers:        Constitutional    Fever or chills:      PHYSICAL EXAMINATION:  There were no vitals filed for this visit.   General:  WDWN in NAD; vital signs documented above Gait: Not observed HENT: WNL, normocephalic Pulmonary: normal non-labored breathing , without wheezing Cardiac: regular HR Abdomen: soft, NT, no masses Skin: without rashes Vascular Exam/Pulses:  Right Left  Radial 2+ (normal) 2+ (normal)  Ulnar                     Extremities: without ischemic changes, without Gangrene , without cellulitis; without open wounds;  Musculoskeletal: no muscle wasting or atrophy  Neurologic: A&O X 3;  No focal weakness or paresthesias are detected Psychiatric:  The pt has Normal affect.   Non-Invasive Vascular Imaging:    IMPRESSION: Patent right innominate arterial stent. No residual thrombus is noted    ASSESSMENT/PLAN: Connie Herrera is a 48 y.o. female presenting status post right carotid cutdown, retrograde innominate stenting using a VBX stent.  On  exam,Connie Herrera was doing well, asymptomatic with no new history of TIA, stroke, amaurosis. Blood pressure higher in the right upper extremity than left. No signs of stenosis on bilateral carotid duplex ultrasound.  Off anticoagulation per hematology. Asked to continue aspirin  lifelong.  I asked that she call me should any questions or concerns arise. Plan for 1 year follow-up with carotid duplex ultrasound.   Fonda FORBES Rim, MD [facility] [phone]

## 2023-12-23 ENCOUNTER — Encounter: Payer: Self-pay | Admitting: Vascular Surgery

## 2023-12-23 ENCOUNTER — Ambulatory Visit (INDEPENDENT_AMBULATORY_CARE_PROVIDER_SITE_OTHER): Admitting: Vascular Surgery

## 2023-12-23 ENCOUNTER — Ambulatory Visit (HOSPITAL_COMMUNITY)
Admission: RE | Admit: 2023-12-23 | Discharge: 2023-12-23 | Disposition: A | Discharge status: Home / Self Care | Source: Ambulatory Visit | Attending: Vascular Surgery | Admitting: Vascular Surgery

## 2023-12-23 VITALS — BP 144/91 | HR 80 | Temp 98.0°F | Resp 18 | Wt 240.7 lb

## 2023-12-23 DIAGNOSIS — Z959 Presence of cardiac and vascular implant and graft, unspecified: Secondary | ICD-10-CM | POA: Insufficient documentation

## 2023-12-23 DIAGNOSIS — I6529 Occlusion and stenosis of unspecified carotid artery: Secondary | ICD-10-CM | POA: Diagnosis present

## 2023-12-28 ENCOUNTER — Other Ambulatory Visit: Payer: Self-pay | Admitting: Nurse Practitioner

## 2023-12-28 DIAGNOSIS — K219 Gastro-esophageal reflux disease without esophagitis: Secondary | ICD-10-CM

## 2023-12-28 NOTE — Telephone Encounter (Unsigned)
 Copied from CRM #[id number]. Topic: Clinical - Medication Refill >> Dec 28, 2023 11:33 AM Dedra B wrote: Medication: pantoprazole  (PROTONIX ) 40 MG tablet  Has the patient contacted their pharmacy? Yes, pt said pharmacy requested a refill 9/19.   This is the patient's preferred pharmacy:   [id number] [place], [place] - [address] [place] [address] Phone: [phone] Fax: [phone]  Is this the correct pharmacy for this prescription? Yes  Has the prescription been filled recently? No  Is the patient out of the medication? Yes  Has the patient been seen for an appointment in the last year OR does the patient have an upcoming appointment? Yes  Can we respond through MyChart? Yes  Agent: Please be advised that Rx refills may take up to 3 business days. We ask that you follow-up with your pharmacy.

## 2023-12-29 MED ORDER — PANTOPRAZOLE SODIUM 40 MG PO TBEC: 40.0000 mg | DELAYED_RELEASE_TABLET | Freq: Every day | ORAL | 0 refills | Status: DC

## 2023-12-29 NOTE — Telephone Encounter (Signed)
 Requested Prescriptions  Pending Prescriptions Disp Refills   pantoprazole  (PROTONIX ) 40 MG tablet 90 tablet 1    Sig: Take 1 tablet (40 mg total) by mouth daily.     Gastroenterology: Proton Pump Inhibitors Passed - 12/29/2023 12:46 PM      Passed - Valid encounter within last 12 months    Recent Outpatient Visits           1 month ago Muscle spasms of both lower extremities   [facility] - [facility] Dept Of [place]. [facility] Vicci Barnie NOVAK, MD   2 months ago Primary hypertension   [facility] - A Dept Of [place]. [facility] Theotis Haze ORN, NP   7 months ago Primary hypertension   [facility] [place] [facility] - A Dept Of [place]. [facility] Theotis Haze ORN, NP   10 months ago Submucous leiomyoma of uterus   [facility] [place] - A Dept Of [place]. [facility] Theotis Haze ORN, NP   11 months ago Primary hypertension   [facility] [facility] - A Dept Of [place]. [facility] Fleeta Tonia Garnette LITTIE, RPH-CPP

## 2024-01-01 ENCOUNTER — Other Ambulatory Visit: Payer: Self-pay | Admitting: Physician Assistant

## 2024-01-01 DIAGNOSIS — I10 Essential (primary) hypertension: Secondary | ICD-10-CM

## 2024-01-05 ENCOUNTER — Ambulatory Visit

## 2024-01-05 ENCOUNTER — Other Ambulatory Visit (HOSPITAL_COMMUNITY): Payer: Self-pay

## 2024-01-05 VITALS — Ht 67.0 in | Wt 240.0 lb

## 2024-01-05 DIAGNOSIS — Z1211 Encounter for screening for malignant neoplasm of colon: Secondary | ICD-10-CM

## 2024-01-05 MED ORDER — PEG 3350-KCL-NA BICARB-NACL 420 G PO SOLR
4000.0000 mL | Freq: Once | ORAL | 0 refills | Status: AC
  Filled 2024-01-05 – 2024-01-17 (×2): qty 4000, 1d supply, fill #0

## 2024-01-05 NOTE — Progress Notes (Signed)

## 2024-01-10 ENCOUNTER — Other Ambulatory Visit: Payer: Self-pay | Admitting: Physician Assistant

## 2024-01-10 DIAGNOSIS — I10 Essential (primary) hypertension: Secondary | ICD-10-CM

## 2024-01-11 ENCOUNTER — Other Ambulatory Visit: Payer: Self-pay | Admitting: Nurse Practitioner

## 2024-01-11 ENCOUNTER — Telehealth: Payer: Self-pay | Admitting: Cardiology

## 2024-01-11 DIAGNOSIS — I10 Essential (primary) hypertension: Secondary | ICD-10-CM

## 2024-01-11 MED ORDER — AMLODIPINE BESYLATE 10 MG PO TABS: 10.0000 mg | ORAL_TABLET | Freq: Every day | ORAL | 0 refills | Status: DC

## 2024-01-11 NOTE — Telephone Encounter (Signed)
 RX sent in

## 2024-01-11 NOTE — Telephone Encounter (Signed)
 Copied from CRM #[id number]. Topic: Clinical - Medication Refill >> Jan 11, 2024 10:13 AM Wess RAMAN wrote: Medication: amLODipine  (NORVASC ) 10 MG tablet   Has the patient contacted their pharmacy? Yes (Agent: If no, request that the patient contact the pharmacy for the refill. If patient does not wish to contact the pharmacy document the reason why and proceed with request.) (Agent: If yes, when and what did the pharmacy advise?) Call heart doctor  This is the patient's preferred pharmacy:   [id number] [place], [place] - [address] [place] [address] Phone: [phone] Fax: [phone]  Is this the correct pharmacy for this prescription? Yes If no, delete pharmacy and type the correct one.   Has the prescription been filled recently? Yes  Is the patient out of the medication? Yes  Has the patient been seen for an appointment in the last year OR does the patient have an upcoming appointment? Yes  Can we respond through MyChart? Yes  Agent: Please be advised that Rx refills may take up to 3 business days. We ask that you follow-up with your pharmacy.

## 2024-01-11 NOTE — Telephone Encounter (Signed)
*  STAT* If patient is at the pharmacy, call can be transferred to refill team.   1. Which medications need to be refilled? (please list name of each medication and dose if known)   amLODipine  (NORVASC ) 10 MG tablet    2. Which pharmacy/location (including street and city if local pharmacy) is medication to be sent to?  [id number] [place], [place] - [address]      3. Do they need a 30 day or 90 day supply? 90 day    Pt is out of medication and scheduled to come in office on 01/13/2024.

## 2024-01-13 ENCOUNTER — Ambulatory Visit: Admitting: Cardiology

## 2024-01-13 NOTE — Telephone Encounter (Signed)
 Duplicate request, LRF 01/11/24 for 30 days.  Requested Prescriptions  Pending Prescriptions Disp Refills   amLODipine  (NORVASC ) 10 MG tablet 15 tablet 0    Sig: Take 1 tablet (10 mg total) by mouth daily. Please call [phone] to schedule an appointment with a new cardiologist for future refills. Thank you. 2nd attempt.     Cardiovascular: Calcium  Channel Blockers 2 Failed - 01/13/2024  9:02 AM      Failed - Last BP in normal range    BP Readings from Last 1 Encounters:  12/23/23 (!) 144/91         Passed - Last Heart Rate in normal range    Pulse Readings from Last 1 Encounters:  12/23/23 80         Passed - Valid encounter within last 6 months    Recent Outpatient Visits           2 months ago Muscle spasms of both lower extremities   [facility] [place] [facility] - A Dept Of [place]. [facility] Vicci Barnie NOVAK, MD   2 months ago Primary hypertension   [facility] - A Dept Of [place]. [facility] Theotis Haze ORN, NP   8 months ago Primary hypertension   [facility] - A Dept Of [place]. [facility] Theotis Haze ORN, NP   11 months ago Submucous leiomyoma of uterus   [place] [facility] [place] - A Dept Of [place]. [facility] Theotis Haze ORN, NP   11 months ago Primary hypertension   [facility] - A Dept Of [place]. [facility] Fleeta Morris, Garnette CROME, RPH-CPP       Future Appointments             Today Michele Richardson, DO [facility] A Dept of [employer]. [employer], [employer]

## 2024-01-14 ENCOUNTER — Other Ambulatory Visit (HOSPITAL_COMMUNITY): Payer: Self-pay

## 2024-01-14 ENCOUNTER — Ambulatory Visit: Attending: Cardiology | Admitting: Cardiology

## 2024-01-17 ENCOUNTER — Other Ambulatory Visit: Payer: Self-pay

## 2024-01-17 ENCOUNTER — Other Ambulatory Visit (HOSPITAL_COMMUNITY): Payer: Self-pay

## 2024-01-17 ENCOUNTER — Other Ambulatory Visit: Payer: Self-pay | Admitting: Interventional Radiology

## 2024-01-17 DIAGNOSIS — D259 Leiomyoma of uterus, unspecified: Secondary | ICD-10-CM

## 2024-01-18 ENCOUNTER — Ambulatory Visit: Payer: Self-pay

## 2024-01-18 NOTE — Telephone Encounter (Addendum)
 Patient going to [facility] tomorrow. Patient advised the if s/s worsen to go UC, ED or Call 911. Patient voiced understanding

## 2024-01-18 NOTE — Telephone Encounter (Signed)
 Patient reports possible nerve jumping on the side of her head. Patient states this has been going on for four days and is concerned with her history of stroke. Patient is asking for an appointment to be evaluated. No availability per DT-needing a follow up call from office for scheduling   FYI Only or Action Required?: Action required by provider: request for appointment.  Patient was last seen in primary care on 11/02/2023 by Vicci Barnie NOVAK, MD.  Called Nurse Triage reporting Spasms.  Symptoms began 4 days ago.  Interventions attempted: Rest, hydration, or home remedies.  Symptoms are: unchanged.  Triage Disposition: See PCP When Office is Open (Within 3 Days)  Patient/caregiver understands and will follow disposition?:  Copied from CRM #[id number]. Topic: Clinical - Red Word Triage >> Jan 18, 2024  3:56 PM Wess RAMAN wrote: Red Word that prompted transfer to Nurse Triage: Patient not feeling good. Nerve on side of head keeps jumping and worrying patient. Patient has been experiencing this for about 4 days and had a stroke last year.  Would like to come in for an appt. Reason for Disposition  [1] Muscle jerks or tics without an obvious cause AND [2] brief (seconds, gone now) AND [3] otherwise feels normal (well)  Answer Assessment - Initial Assessment Questions 1. APPEARANCE of MOVEMENT: What did the jerking or twitching look like? (e.g., body area)     Patient reports feels like the nerve on the left side of head keeps jumping.  2. ONSET: When did this start happening? (e.g., hours, days, weeks, months ago)     Started about 4 days ago 3. DURATION: How long does the jerk, twitch, or spasm last?     Last a couple of seconds 4. FREQUENCY:  How often does this happen?      Happened twice today-patient states this has been happening for 2-4 times a day 5. WHEN: When does this happen? (e.g., while awake, while falling asleep, while sleeping)     Different times of the  day 6. CAUSE: What do you think caused the muscle jerking?     unsure 7. OTHER SYMPTOMS: Are there any other symptoms? (e.g., fever, headache)     no 8. PREGNANCY: Is there any chance you are pregnant? When was your last menstrual period?     no  Protocols used: Muscle Jerks - Tics - [facility]

## 2024-01-19 ENCOUNTER — Telehealth: Payer: Self-pay | Admitting: *Deleted

## 2024-01-19 ENCOUNTER — Encounter: Admitting: Gastroenterology

## 2024-01-19 NOTE — Telephone Encounter (Signed)
 [phone]- attempted to reach pt- no answer and no voicemail to leave a message  800- attempted to reach pt again- no answer, no voicemail to leave message

## 2024-01-20 ENCOUNTER — Encounter: Payer: Self-pay | Admitting: Cardiology

## 2024-01-20 ENCOUNTER — Ambulatory Visit: Attending: Cardiology | Admitting: Cardiology

## 2024-01-20 VITALS — BP 118/81 | HR 89 | Resp 16 | Ht 67.0 in | Wt 236.2 lb

## 2024-01-20 DIAGNOSIS — E782 Mixed hyperlipidemia: Secondary | ICD-10-CM | POA: Insufficient documentation

## 2024-01-20 DIAGNOSIS — I10 Essential (primary) hypertension: Secondary | ICD-10-CM | POA: Diagnosis present

## 2024-01-20 DIAGNOSIS — I639 Cerebral infarction, unspecified: Secondary | ICD-10-CM | POA: Diagnosis not present

## 2024-01-20 MED ORDER — AMLODIPINE BESYLATE 10 MG PO TABS: 10.0000 mg | ORAL_TABLET | Freq: Every day | ORAL | 3 refills | Status: AC

## 2024-01-20 NOTE — Progress Notes (Signed)
 Cardiology Office Note:  .    ID:  Connie, Herrera [birth year], MRN [id number] PCP:  Theotis Haze ORN, NP  Former Cardiology Providers: Dr. Ronal Ross [facility] Providers Cardiologist:  Madonna Large, DO , [facility] (established care 01/20/24) Electrophysiologist:  None  Click to update primary MD,subsp 1 ecialty MD or APP then REFRESH:1}    Chief Complaint  Patient presents with   Follow-up    > 1 Year follow-up    History of Present Illness: .   Connie Herrera is a 48 y.o. 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  Formally under the care of Dr. Ronal Ross who last saw Connie Herrera back in May 2024. I am seeing her for the first time to re-establishing care.   In January 2024 patient presented to the hospital with left arm weakness and numbness.  She underwent MRI which noted multiple small areas of diffusion signal abnormalities in occipital/parietal lobes bilaterally.  She was evaluated extensively by multiple disciplines of medicine.  Hypercoagulable workup in the past was reported to be negative.  Transesophageal echocardiogram was also performed to rule out embolic source.  Patient was also recommended to have a cardiac monitor to rule out atrial fibrillation.  I am seeing her for the first time to reestablish care.  Patient states that her home blood pressures are well-controlled. Patient requesting a refill on amlodipine . No reoccurrence of stroke since last office visit. It appears the patient was suppose have a 30-day cardiac monitor but the  device was turned in sooner due to skin irritation and itching.  Review of Systems: .   Review of Systems  Cardiovascular:  Negative for chest pain, claudication, irregular heartbeat, leg swelling, near-syncope, orthopnea, palpitations, paroxysmal nocturnal dyspnea and syncope.  Respiratory:  Positive for shortness of breath.   Hematologic/Lymphatic: Negative for bleeding problem.    Studies Reviewed:   EKG: EKG Interpretation Date/Time:  Thursday January 20 2024 09:43:07 EDT Ventricular Rate:  92 PR Interval:  182 QRS Duration:  78 QT Interval:  358 QTC Calculation: 442 R Axis:   2  Text Interpretation: Normal sinus rhythm Minimal voltage criteria for LVH, may be normal variant ( R in aVL ) Nonspecific T wave abnormality When compared with ECG of 07-Sep-2022 11:14, Nonspecific T wave abnormality , new Since last tracing Confirmed by Large Madonna [phone]) on 01/20/2024 10:09:44 AM  Cardiac monitor: 09/2022 30 day Monitor was worn for 2 days. 2:1 AV block in the early AM, asymptomatic. Otherwise, no significant tachyarrhythmia or bradyarrhythmia. No atrial fibrillation or flutter.   Carotid duplex: 90/81/7974 RICA & LICA near-normal with minimal wall thickening or plaque.  Vertebrals:  Bilateral vertebral arteries demonstrate antegrade flow.  Subclavians: Normal flow hemodynamics were seen in bilateral subclavian arteries. The innominate artery stent not well viualized.   RADIOLOGY: MRI brain without contrast: April 09, 2022 1. Multiple small areas of diffusion hyperintense signal in the occipital parietal lobes bilaterally. Some of these lesions do mild enhancement. Appearance is nonspecific but could be seen with subacute infarct. Demyelinating disease a consideration but not typical. Also consider inflammatory process such as sarcoid, vasculitis, lupus and PRES. Recommend lumbar puncture for further evaluation. 2. 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 Enhancement is somewhat atypical however is stable. No other areas of hemorrhage. 3. Mild white matter changes in the cerebral hemispheres bilaterally. This may be due to chronic microvascular ischemia. Demyelinating disease not excluded. Correlate with risk factors for small vessel disease.  MRI brain without contrast 08/26/2022 1. No acute finding. 2. 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 3. Mild chronic small vessel disease. Small chronic right cerebellar infarct since prior.  Risk Assessment/Calculations:   NA   Labs:       Latest Ref Rng & Units 12/05/2023    8:55 AM 10/22/2023    2:24 PM 05/17/2023    3:51 PM  CBC  WBC 4.0 - 10.5 K/uL 7.8  7.1  7.3   Hemoglobin 12.0 - 15.0 g/dL 87.1  87.3  87.2   Hematocrit 36.0 - 46.0 % 39.8  39.1  38.9   Platelets 150 - 400 K/uL 392  399  434        Latest Ref Rng & Units 12/05/2023    8:55 AM 11/02/2023    4:54 PM 05/17/2023    3:51 PM  BMP  Glucose 70 - 99 mg/dL 889  82  879   BUN 6 - 20 mg/dL 11  13  10    Creatinine 0.44 - 1.00 mg/dL 9.28  8.93  9.32   BUN/Creat Ratio 9 - 23  12  15    Sodium 135 - 145 mmol/L 138  140  141   Potassium 3.5 - 5.1 mmol/L 3.8  4.3  3.6   Chloride 98 - 111 mmol/L 106  103  102   CO2 22 - 32 mmol/L 22  23  24    Calcium  8.9 - 10.3 mg/dL 8.7  9.4  9.3       Latest Ref Rng & Units 12/05/2023    8:55 AM 11/02/2023    4:54 PM 05/17/2023    3:51 PM  CMP  Glucose 70 - 99 mg/dL 889  82  879   BUN 6 - 20 mg/dL 11  13  10    Creatinine 0.44 - 1.00 mg/dL 9.28  8.93  9.32   Sodium 135 - 145 mmol/L 138  140  141   Potassium 3.5 - 5.1 mmol/L 3.8  4.3  3.6   Chloride 98 - 111 mmol/L 106  103  102   CO2 22 - 32 mmol/L 22  23  24    Calcium  8.9 - 10.3 mg/dL 8.7  9.4  9.3   Total Protein 6.5 - 8.1 g/dL 6.9   6.9   Total Bilirubin 0.0 - 1.2 mg/dL 0.4    <9.7   Alkaline Phos 38 - 126 U/L 65   79   AST 15 - 41 U/L 18   14   ALT 0 - 44 U/L 24   22     Lab Results  Component Value Date   CHOL 138 04/10/2022   HDL 36 (L) 04/10/2022   LDLCALC 68 04/10/2022   TRIG 171 (H) 04/10/2022   CHOLHDL 3.8 04/10/2022   No results for input(s): LIPOA in the last 8760 hours. No components found for: NTPROBNP No results for input(s): PROBNP in the last 8760 hours. No results for input(s): TSH in the last 8760 hours.  Physical Exam:    Today's Vitals   01/20/24 0940  BP: 118/81  Pulse: 89  Resp: 16  SpO2: 95%  Weight: 236 lb 3.2 oz (107.1 kg)  Height: 5' 7 (1.702 m)   Body mass index is 36.99 kg/m. Wt Readings from Last 3 Encounters:  01/20/24 236 lb 3.2 oz (107.1 kg)  01/05/24 240 lb (108.9 kg)  12/23/23 240 lb 11.2 oz (109.2 kg)    Physical Exam  Constitutional: No distress.  hemodynamically stable  Neck: No JVD present.  Cardiovascular: Normal rate, regular rhythm, S1 normal and S2 normal. Exam reveals no gallop, no S3 and no S4.  No murmur heard. Prior right sided neck surgery site is well healed.   Pulmonary/Chest: Effort normal and breath sounds normal. No stridor. She has no wheezes. She has no rales.  Musculoskeletal:        General: No edema.     Cervical back: Neck supple.  Skin: Skin is warm.     Impression & Recommendation(s):  Impression:   ICD-10-CM   1. Hx of Cerebrovascular accident (CVA)  I63.9 EKG 12-Lead    Cardiac event monitor    2. Benign hypertension  I10     3. Mixed hyperlipidemia  E78.2     4. Hypertension, unspecified type  I10 amLODipine  (NORVASC ) 10 MG tablet       Recommendation(s):  Benign hypertension Office blood pressures are very well-controlled. Home blood pressures are also well-controlled. Will refill amlodipine  10 mg p.o. daily. Continue losartan  50 mg p.o. daily. Recommend that she have her amlodipine  filled by PCP to present longitudinally for continuity of  care  Hx of Cerebrovascular accident (CVA) Medical records reviewed. Her prior 30-day cardiac event monitor was turned in sooner.  Recommend repeating a cardiac monitor for 30 days to evaluate for dysrhythmias or A-fib given her history of stroke  Mixed hyperlipidemia Continue Crestor  10 mg p.o. daily  From cardiovascular standpoint as long as the cardiac monitor is overall unremarkable no additional testing is warranted at this time. Patient is advised to continue improving her modifiable cardiovascular risk factors. Would like to see her back on appearing basis sooner if needed.  Patient agreeable with the plan of care.  Orders Placed:  Orders Placed This Encounter  Procedures   Cardiac event monitor    Standing Status:   Future    Expected Date:   01/21/2024    Expiration Date:   01/19/2025    Scheduling Instructions:     30 day event monitor   Patient is skin sensitive    Where should this test be performed?:   [facility]   EKG 12-Lead     Final Medication List:    Meds ordered this encounter  Medications   amLODipine  (NORVASC ) 10 MG tablet    Sig: Take 1 tablet (10 mg total) by mouth daily.    Dispense:  90 tablet    Refill:  3    Medications Discontinued During This Encounter  Medication Reason   oxyCODONE -acetaminophen  (PERCOCET) 10-325 MG tablet Patient Preference   amLODipine  (NORVASC ) 10 MG tablet Reorder     Current Outpatient Medications:    acyclovir  (ZOVIRAX ) 400 MG tablet, TAKE 1 TABLET BY MOUTH 3 TIMES DAILY FOR 5 DAYS., Disp: 15 tablet, Rfl: 1   aspirin  EC 81 MG tablet, Take 1 tablet (81 mg total) by mouth daily. Take daily at 6 am. Swallow whole., Disp: 90 tablet, Rfl: 3   Blood Pressure Monitor DEVI, Please provide patient with insurance approved blood pressure monitor, Disp: 1 each, Rfl: 0  ferrous sulfate  325 (65 FE) MG EC tablet, Take 1 tablet (325 mg total) by mouth 2 (two) times daily., Disp: 180 tablet, Rfl: 3   losartan  (COZAAR ) 50 MG  tablet, Take 1 tablet (50 mg total) by mouth daily., Disp: 90 tablet, Rfl: 1   naloxone  (NARCAN ) nasal spray 4 mg/0.1 mL, Use 1 spray every 3 minutes, spray 1 dose into ONE nostril, alternate nostrils with each dose until help arrives, Disp: 2 each, Rfl: 0   ondansetron  (ZOFRAN ) 8 MG tablet, Take 1 tablet (8 mg total) by mouth every 8 (eight) hours as needed for nausea or vomiting., Disp: 30 tablet, Rfl: 0   pantoprazole  (PROTONIX ) 40 MG tablet, Take 1 tablet (40 mg total) by mouth daily., Disp: 90 tablet, Rfl: 0   promethazine  (PHENERGAN ) 12.5 MG tablet, Take 1 tablet (12.5 mg total) by mouth every 4 (four) hours as needed for nausea or vomiting., Disp: 30 tablet, Rfl: 0   rosuvastatin  (CRESTOR ) 10 MG tablet, TAKE 1 TABLET BY MOUTH EVERY DAY, Disp: 90 tablet, Rfl: 3   triamcinolone  ointment (KENALOG ) 0.1 %, Apply topically 2 (two) times daily., Disp: 30 g, Rfl: 3   amLODipine  (NORVASC ) 10 MG tablet, Take 1 tablet (10 mg total) by mouth daily., Disp: 90 tablet, Rfl: 3   gabapentin  (NEURONTIN ) 100 MG capsule, Take 1-4 capsules (100-400 mg total) by mouth at bedtime as needed. (Patient not taking: Reported on 01/20/2024), Disp: 50 capsule, Rfl: 0  Consent:   NA  Disposition:   .  Her questions and concerns were addressed to her satisfaction. She voices understanding of the recommendations provided during this encounter.    Signed, Madonna Michele HAS, [facility]  A Division of [facility] [address] [place], [place] [address]

## 2024-01-20 NOTE — Patient Instructions (Addendum)
 Medication Instructions:  Continue same medications  Lab Work: None ordered  Testing/Procedures: 30 day heart monitor will be mailed to your home with instructions  Follow-Up: At [facility], you and your health needs are our priority.  As part of our continuing mission to provide you with exceptional heart care, our providers are all part of one team.  This team includes your primary Cardiologist (physician) and Advanced Practice Providers or APPs (Physician Assistants and Nurse Practitioners) who all work together to provide you with the care you need, when you need it.  Your next appointment:  As Needed    Provider:  Dr.Tolia    We recommend signing up for the patient portal called MyChart.  Sign up information is provided on this After Visit Summary.  MyChart is used to connect with patients for Virtual Visits (Telemedicine).  Patients are able to view lab/test results, encounter notes, upcoming appointments, etc.  Non-urgent messages can be sent to your provider as well.   To learn more about what you can do with MyChart, go to [email or web address].

## 2024-01-24 ENCOUNTER — Other Ambulatory Visit: Payer: Self-pay | Admitting: Family Medicine

## 2024-01-24 DIAGNOSIS — B009 Herpesviral infection, unspecified: Secondary | ICD-10-CM

## 2024-01-25 ENCOUNTER — Encounter: Payer: Self-pay | Admitting: Cardiology

## 2024-01-27 ENCOUNTER — Other Ambulatory Visit: Payer: Self-pay

## 2024-01-27 ENCOUNTER — Ambulatory Visit
Admission: RE | Admit: 2024-01-27 | Discharge: 2024-01-27 | Disposition: A | Discharge status: Home / Self Care | Source: Ambulatory Visit | Attending: Interventional Radiology | Admitting: Interventional Radiology

## 2024-01-27 DIAGNOSIS — D259 Leiomyoma of uterus, unspecified: Secondary | ICD-10-CM

## 2024-01-29 ENCOUNTER — Other Ambulatory Visit (HOSPITAL_COMMUNITY): Payer: Self-pay

## 2024-02-03 ENCOUNTER — Ambulatory Visit: Payer: Self-pay

## 2024-02-03 NOTE — Telephone Encounter (Signed)
 FYI Only or Action Required?: FYI only for provider: appointment scheduled on 02/04/24.  Patient was last seen in primary care on 11/02/2023 by Vicci Barnie NOVAK, MD.  Called Nurse Triage reporting Abdominal Pain.  Symptoms began several days ago.  Interventions attempted: Prescription medications: pantoprazole .  Symptoms are: unchanged.  Triage Disposition: See Physician Within 24 Hours  Patient/caregiver understands and will follow disposition?: Yes   Copied from CRM #[id number]. Topic: Clinical - Red Word Triage >> Feb 03, 2024  1:26 PM Pinkey ORN wrote: Red Word that prompted transfer to Nurse Triage: Nausea >> Feb 03, 2024  1:27 PM Pinkey ORN wrote: Patient states she's experiencing weight loss, as well as not being able to keep anything down but mentioned no vomiting. Patient also states she's having some stomach discomfort.  Reason for Disposition  [1] MODERATE pain (e.g., interferes with normal activities) AND [2] pain comes and goes (cramps) AND [3] present > 24 hours  (Exception: Pain with Vomiting or Diarrhea - see that Guideline.)  Answer Assessment - Initial Assessment Questions No available appts today. Scheduled with alternative provider, 02/04/24.  Advised UC/ED if symptoms worsen.  1. LOCATION: Where does it hurt?      Above belly button, all over, nausea and cramping, no vomiting, lost 10lbs Last BM today; normal 2. RADIATION: Does the pain shoot anywhere else? (e.g., chest, back)     Only in abd  3. ONSET: When did the pain begin? (e.g., minutes, hours or days ago)      Months ago, but getting worse last 4 days 5. PATTERN Does the pain come and go, or is it constant?     Coming and going 6. SEVERITY: How bad is the pain?  (e.g., Scale 1-10; mild, moderate, or severe)     10/10 8. CAUSE: What do you think is causing the stomach pain? (e.g., gallstones, recent abdominal surgery)     unsure 9. RELIEVING/AGGRAVATING FACTORS: What makes it better  or worse? (e.g., antacids, bending or twisting motion, bowel movement)     Pantozole takes this morning; not sure 10. OTHER SYMPTOMS: Do you have any other symptoms? (e.g., back pain, diarrhea, fever, urination pain, vomiting) Denies chest pain, fever, chills, diarrhea, back/ urination pain  Protocols used: Abdominal Pain - Female-A-AH

## 2024-02-03 NOTE — Telephone Encounter (Signed)
 Noted, appointment scheduled with different provider 10/31.

## 2024-02-04 ENCOUNTER — Ambulatory Visit: Payer: Self-pay | Admitting: Family Medicine

## 2024-02-07 ENCOUNTER — Encounter: Payer: Self-pay | Admitting: Internal Medicine

## 2024-02-07 ENCOUNTER — Ambulatory Visit: Attending: Nurse Practitioner | Admitting: Internal Medicine

## 2024-02-07 VITALS — BP 128/85 | HR 82 | Wt 236.4 lb

## 2024-02-07 DIAGNOSIS — R1013 Epigastric pain: Secondary | ICD-10-CM

## 2024-02-07 DIAGNOSIS — K219 Gastro-esophageal reflux disease without esophagitis: Secondary | ICD-10-CM

## 2024-02-07 NOTE — Progress Notes (Signed)
 Patient ID: Connie Herrera, female    DOB: 12/21/1975  MRN: [id number]  CC: Abdominal Pain and Back Pain   Subjective: Connie Herrera is a 48 y.o. female who presents for UC visit. 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   Discussed the use of AI scribe software for clinical note transcription with the patient, who gave verbal consent to proceed.  History of Present Illness Connie Herrera is a 48 year old female who presents with bloating and indigestion.  She has been experiencing significant gas buildup in her chest and back, particularly after meals, and finds it difficult to belch adequately. The bloating sensation began after undergoing fibroid surgery (sounds like embolization procedure) in February.  Indigestion occurs after every meal, described as a sensation of gas and air in her stomach and chest, accompanied by upper back pain. The indigestion began about a month ago, following a period of stomach queasiness that has since resolved. She has attempted dietary changes, eliminating sugar and bread, but has not pinpointed specific foods that exacerbate her symptoms, although she suspects spicy foods may worsen her condition.  She is currently taking pantoprazole  once daily for acid reflux. She also takes aspirin  daily due to a history of stroke and blood clot, and she takes an iron pill, which she notes causes her stools to be dark and may contribute to indigestion. No constipation, normal bowel movements, no current stomach pain, but bloating after eating.     Patient Active Problem List   Diagnosis  Date Noted   Acute CVA (cerebrovascular accident) (HCC) 04/11/2022   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   Left arm weakness 04/09/2022   Iron deficiency anemia 04/09/2022   Hypokalemia 04/09/2022   Muscle spasms of both lower extremities 08/14/2020   Fibroid uterus 08/14/2020   Anemia due to blood loss, chronic 01/10/2020   Menorrhagia with regular cycle 01/09/2020   Class 1 obesity due to excess calories with body mass index (BMI) of 34.0 to 34.9 in adult 01/09/2020   Screening breast examination 02/07/2019   Breast pain 02/07/2019   Hypertension 01/26/2019   Paresthesias 08/04/2013   Breast pain, right 03/11/2012     Current Outpatient Medications on File Prior to Visit  Medication Sig Dispense Refill   acyclovir  (ZOVIRAX ) 400 MG tablet TAKE 1 TABLET BY MOUTH 3 TIMES DAILY FOR 5 DAYS. 15 tablet 1   amLODipine  (NORVASC ) 10 MG tablet Take 1 tablet (10 mg total) by mouth daily. 90 tablet 3   aspirin  EC 81 MG tablet Take 1 tablet (81 mg total) by mouth daily. Take daily at 6 am. Swallow whole. 90 tablet 3   Blood Pressure Monitor DEVI Please provide patient with insurance approved blood pressure monitor 1 each 0   ferrous sulfate  325 (65 FE) MG EC tablet Take 1 tablet (325 mg total) by mouth 2 (two) times daily. 180 tablet 3   gabapentin  (NEURONTIN ) 100 MG capsule Take 1-4 capsules (100-400 mg total) by mouth at bedtime as needed. 50 capsule 0   losartan  (COZAAR ) 50 MG tablet Take 1 tablet (50 mg total) by mouth daily. 90 tablet 1   naloxone  (NARCAN ) nasal spray 4 mg/0.1 mL Use 1 spray every 3  minutes, spray 1 dose into ONE nostril, alternate nostrils with each dose until help arrives 2 each 0   ondansetron  (ZOFRAN ) 8 MG tablet Take 1 tablet (8 mg total) by mouth every 8 (eight) hours as needed for nausea or vomiting. 30 tablet 0   pantoprazole  (PROTONIX ) 40 MG tablet Take 1 tablet (40 mg total) by mouth daily. 90 tablet 0   promethazine  (PHENERGAN ) 12.5 MG tablet Take 1 tablet (12.5 mg  total) by mouth every 4 (four) hours as needed for nausea or vomiting. 30 tablet 0   rosuvastatin  (CRESTOR ) 10 MG tablet TAKE 1 TABLET BY MOUTH EVERY DAY 90 tablet 3   triamcinolone  ointment (KENALOG ) 0.1 % Apply topically 2 (two) times daily. 30 g 3   [DISCONTINUED] hydrochlorothiazide  (HYDRODIURIL ) 25 MG tablet Take 1 tablet (25 mg total) by mouth daily. Must have office visit for refills 30 tablet 0   No current facility-administered medications on file prior to visit.    No Known Allergies  Social History   Socioeconomic History   Marital status: Single    Spouse name: Not on file   Number of children: 4   Years of education: 14   Highest education level: Some college, no degree  Occupational History   Occupation: Dietary Supervisor    Comment: [employer]  Tobacco Use   Smoking status: Former    Current packs/day: 0.00    Average packs/day: 0.5 packs/day for 4.0 years (2.0 ttl pk-yrs)    Types: Cigarettes    Start date: 06/04/2013    Quit date: 06/04/2017    Years since quitting: 6.6    Passive exposure: Past   Smokeless tobacco: Former  Building Services Engineer status: Never Used  Substance and Sexual Activity   Alcohol use: No   Drug use: Yes    Types: Marijuana    Comment: 09/30/20 maybe once a week   Sexual activity: Yes    Birth control/protection: Surgical  Other Topics Concern   Not on file  Social History Narrative   Patient lives at home with children.    Patient has 4 children.    Patient is single.    Patient has 2 years of college.    Patient is right handed.    Patient is working full time. [employer].    Social Drivers of Corporate Investment Banker Strain: Not on file  Food Insecurity: No Food Insecurity (04/10/2022)   Hunger Vital Sign    Worried About Running Out of Food in the Last Year: Never true    Ran Out of Food in the Last Year: Never true  Transportation Needs: No Transportation Needs (04/10/2022)   PRAPARE - Therapist, Art (Medical): No    Lack of Transportation (Non-Medical): No  Physical Activity: Not on file  Stress: Not on file  Social Connections: Not on file  Intimate Partner Violence: Not At Risk (04/10/2022)   Humiliation, Afraid, Rape, and Kick questionnaire    Fear of Current or Ex-Partner: No    Emotionally Abused: No    Physically Abused: No    Sexually Abused: No    Family History  Problem Relation Age of Onset   Colon polyps Mother    Cirrhosis Father    Cancer Maternal Grandmother 74       breast   Breast cancer Maternal Grandmother    Colon cancer Neg Hx    Esophageal cancer Neg Hx    Rectal  cancer Neg Hx    Stomach cancer Neg Hx     Past Surgical History:  Procedure Laterality Date   AORTOGRAM N/A 04/14/2022   Procedure: DIAGNOSTIC AORTOGRAM;  Surgeon: Lanis Fonda BRAVO, MD;  Location: [facility] OR;  Service: Vascular;  Laterality: N/A;   BUBBLE STUDY  06/15/2022   Procedure: BUBBLE STUDY;  Surgeon: Alvan Ronal BRAVO, MD;  Location: [facility] ENDOSCOPY;  Service: Cardiovascular;;   ENDARTERECTOMY Right 04/14/2022   Procedure: CAROTID CUTDOWN WITH BRACHIOCEPHALIC STENTING, WITH ULTRASOUND OF THE RIGHT FEMORAL VEIN;  Surgeon: Lanis Fonda BRAVO, MD;  Location: [facility] OR;  Service: Vascular;  Laterality: Right;   IR EMBO TUMOR ORGAN ISCHEMIA INFARCT INC GUIDE ROADMAPPING  06/08/2023   IR RADIOLOGIST EVAL & MGMT  05/20/2023   IR RADIOLOGIST EVAL & MGMT  06/22/2023   TEE WITHOUT CARDIOVERSION N/A 06/15/2022   Procedure: TRANSESOPHAGEAL ECHOCARDIOGRAM (TEE);  Surgeon: Alvan Ronal BRAVO, MD;  Location: [facility] ENDOSCOPY;  Service: Cardiovascular;  Laterality: N/A;   TUBAL LIGATION      ROS: Review of Systems Negative except as stated above  PHYSICAL EXAM: BP 128/85 (BP Location: Right Arm, Patient Position: Sitting, Cuff Size: Large)   Pulse 82   Wt 236 lb 6.4 oz (107.2 kg)   SpO2 96%   BMI 37.03 kg/m   Physical Exam  General appearance - alert, well appearing, middle age AAF and in no  distress Mental status - normal mood, behavior, speech, dress, motor activity, and thought processes Abdomen - soft, nontender, nondistended, no masses or organomegaly      Latest Ref Rng & Units 12/05/2023    8:55 AM 11/02/2023    4:54 PM 05/17/2023    3:51 PM  CMP  Glucose 70 - 99 mg/dL 889  82  879   BUN 6 - 20 mg/dL 11  13  10    Creatinine 0.44 - 1.00 mg/dL 9.28  8.93  9.32   Sodium 135 - 145 mmol/L 138  140  141   Potassium 3.5 - 5.1 mmol/L 3.8  4.3  3.6   Chloride 98 - 111 mmol/L 106  103  102   CO2 22 - 32 mmol/L 22  23  24    Calcium  8.9 - 10.3 mg/dL 8.7  9.4  9.3   Total Protein 6.5 - 8.1 g/dL 6.9   6.9   Total Bilirubin 0.0 - 1.2 mg/dL 0.4   <9.7   Alkaline Phos 38 - 126 U/L 65   79   AST 15 - 41 U/L 18   14   ALT 0 - 44 U/L 24   22    Lipid Panel     Component Value Date/Time   CHOL 138 04/10/2022 0443   CHOL 138 03/07/2019 0831   TRIG 171 (H) 04/10/2022 0443   HDL 36 (L) 04/10/2022 0443   HDL 30 (L) 03/07/2019 0831   CHOLHDL 3.8 04/10/2022 0443   VLDL 34 04/10/2022 0443   LDLCALC 68 04/10/2022 0443   LDLCALC 87 03/07/2019 0831    CBC    Component Value Date/Time   WBC 7.8 12/05/2023 0855   RBC 4.62 12/05/2023 0855   HGB 12.8 12/05/2023 0855   HGB 12.6 10/22/2023 1424   HCT 39.8 12/05/2023 0855   HCT 39.1 10/22/2023 1424   PLT 392 12/05/2023 0855   PLT 399 10/22/2023 1424   MCV 86.1 12/05/2023 0855   MCV 86 10/22/2023 1424   MCH 27.7 12/05/2023 0855   MCHC 32.2 12/05/2023 0855   RDW  13.0 12/05/2023 0855   RDW 13.8 10/22/2023 1424   LYMPHSABS 1.8 10/22/2023 1424   MONOABS 0.4 12/18/2022 1417   EOSABS 0.1 10/22/2023 1424   BASOSABS 0.0 10/22/2023 1424    ASSESSMENT AND PLAN: 1. Gastroesophageal reflux disease without esophagitis (Primary) 2. Dyspepsia GERD precautions discussed.  Advised to avoid certain foods like spicy foods, tomato-based foods, juices and excessive caffeine .  Advised to eat his last meal at least 2 to 3 hours before laying down  at nights and to sleep with his head slightly elevated.  No NSAIDS Patient currently on pantoprazole  40 mg once a day.  Advised to increase it to twice a day for 1 month then go back to once a day. - H. pylori breath test     Patient was given the opportunity to ask questions.  Patient verbalized understanding of the plan and was able to repeat key elements of the plan.   This documentation was completed using Paediatric nurse.  Any transcriptional errors are unintentional.  Orders Placed This Encounter  Procedures   H. pylori breath test     Requested Prescriptions    No prescriptions requested or ordered in this encounter    Return in about 4 weeks (around 03/06/2024) for Give her f/u with her PCP for chronic ds management.  Barnie Louder, MD, FACP

## 2024-02-07 NOTE — Patient Instructions (Signed)
  VISIT SUMMARY: Today, you were seen for bloating and indigestion that you have been experiencing, particularly after meals. These symptoms began after your fibroid surgery in February. You have tried dietary changes but have not identified specific foods that worsen your symptoms, although you suspect spicy foods may be a trigger. You are currently taking pantoprazole  for acid reflux and have noted that your iron supplement may be contributing to your symptoms.  YOUR PLAN: -INDIGESTION AND BLOATING WITH EPIGASTRIC PAIN AND HEARTBURN: Indigestion and bloating can cause discomfort in your stomach and chest, often after eating. To help manage these symptoms, you should increase your pantoprazole  to twice daily for one month, then reduce it to once daily. Avoid spicy foods, juices, and foods with tomato paste or sauce. Eat your last meal 2-3 hours before lying down and elevate your head while sleeping. Also, avoid taking ibuprofen  and Aleve . A breath test will be ordered to check for a bacterial infection in your stomach.  -IRON SUPPLEMENTATION SIDE EFFECTS: Iron supplements can cause dark stools and may worsen gastrointestinal symptoms. This could be contributing to your indigestion and bloating.  INSTRUCTIONS: Please schedule a follow-up appointment with Dr. Theotis in one month to evaluate the effectiveness of your treatment and manage your chronic condition.                      Contains text generated by Abridge.                                 Contains text generated by Abridge.

## 2024-02-09 ENCOUNTER — Ambulatory Visit: Payer: Self-pay | Admitting: Internal Medicine

## 2024-02-09 LAB — H. PYLORI BREATH TEST: H pylori Breath Test: NEGATIVE

## 2024-02-09 LAB — H. PYLORI BREATH COLLECTION

## 2024-02-10 ENCOUNTER — Ambulatory Visit
Admission: RE | Admit: 2024-02-10 | Discharge: 2024-02-10 | Disposition: A | Discharge status: Home / Self Care | Source: Ambulatory Visit | Attending: Interventional Radiology | Admitting: Interventional Radiology

## 2024-02-10 NOTE — Progress Notes (Signed)
 Chief Complaint: Patient was seen in consultation today for uterine fibroids at the request of Lamontae Ricardo K  Referring Physician(s): Jathen Sudano K  History of Present Illness: Connie Herrera is a 48 y.o. female with a history of highly symptomatic uterine fibroids.  She underwent uterine artery embolization on 06/08/2023.  We had a video conference visit today via epic MyChart for her 6 month follow-up evaluation.    She is pleased to report that she feels 100% better.  She has some issues with mild bloating occasionally but is otherwise well.    Past Medical History:  Diagnosis Date   Brain lesion    Carotid artery occlusion    COVID-19 04/2020   CVA (cerebral vascular accident) (HCC) 04/2022   Diplopia    Hypertension    Iron deficiency anemia     Past Surgical History:  Procedure Laterality Date   AORTOGRAM N/A 04/14/2022   Procedure: DIAGNOSTIC AORTOGRAM;  Surgeon: Lanis Fonda BRAVO, MD;  Location: [facility] OR;  Service: Vascular;  Laterality: N/A;   BUBBLE STUDY  06/15/2022   Procedure: BUBBLE STUDY;  Surgeon: Alvan Ronal BRAVO, MD;  Location: [facility] ENDOSCOPY;  Service: Cardiovascular;;   ENDARTERECTOMY Right 04/14/2022   Procedure: CAROTID CUTDOWN WITH BRACHIOCEPHALIC STENTING, WITH ULTRASOUND OF THE RIGHT FEMORAL VEIN;  Surgeon: Lanis Fonda BRAVO, MD;  Location: [facility] OR;  Service: Vascular;  Laterality: Right;   IR EMBO TUMOR ORGAN ISCHEMIA INFARCT INC GUIDE ROADMAPPING  06/08/2023   IR RADIOLOGIST EVAL & MGMT  05/20/2023   IR RADIOLOGIST EVAL & MGMT  06/22/2023   TEE WITHOUT CARDIOVERSION N/A 06/15/2022   Procedure: TRANSESOPHAGEAL ECHOCARDIOGRAM (TEE);  Surgeon: Alvan Ronal BRAVO, MD;  Location: [facility] ENDOSCOPY;  Service: Cardiovascular;  Laterality: N/A;   TUBAL LIGATION      Allergies: Patient has no known allergies.  Medications: Prior to Admission medications   Medication Sig Start Date End Date Taking? Authorizing Provider  acyclovir  (ZOVIRAX ) 400 MG tablet TAKE 1  TABLET BY MOUTH 3 TIMES DAILY FOR 5 DAYS. 01/25/24   Fleming, Zelda W, NP  amLODipine  (NORVASC ) 10 MG tablet Take 1 tablet (10 mg total) by mouth daily. 01/20/24   Tolia, Sunit, DO  aspirin  EC 81 MG tablet Take 1 tablet (81 mg total) by mouth daily. Take daily at 6 am. Swallow whole. 10/22/23   Theotis Haze ORN, NP  Blood Pressure Monitor DEVI Please provide patient with insurance approved blood pressure monitor 12/23/22   Theotis Haze ORN, NP  ferrous sulfate  325 (65 FE) MG EC tablet Take 1 tablet (325 mg total) by mouth 2 (two) times daily. 10/22/23   Fleming, Zelda W, NP  gabapentin  (NEURONTIN ) 100 MG capsule Take 1-4 capsules (100-400 mg total) by mouth at bedtime as needed. 11/27/22   Leonce Katz, DO  losartan  (COZAAR ) 50 MG tablet Take 1 tablet (50 mg total) by mouth daily. 10/22/23   Fleming, Zelda W, NP  naloxone  (NARCAN ) nasal spray 4 mg/0.1 mL Use 1 spray every 3 minutes, spray 1 dose into ONE nostril, alternate nostrils with each dose until help arrives 07/30/23     ondansetron  (ZOFRAN ) 8 MG tablet Take 1 tablet (8 mg total) by mouth every 8 (eight) hours as needed for nausea or vomiting. 06/07/23   Karalee Wilkie POUR, MD  pantoprazole  (PROTONIX ) 40 MG tablet Take 1 tablet (40 mg total) by mouth daily. 12/29/23   Fleming, Zelda W, NP  promethazine  (PHENERGAN ) 12.5 MG tablet Take 1 tablet (12.5 mg total) by mouth every  4 (four) hours as needed for nausea or vomiting. 06/07/23   Karalee Wilkie POUR, MD  rosuvastatin  (CRESTOR ) 10 MG tablet TAKE 1 TABLET BY MOUTH EVERY DAY 07/05/23   Fleming, Zelda W, NP  triamcinolone  ointment (KENALOG ) 0.1 % Apply topically 2 (two) times daily. 10/22/23   Fleming, Zelda W, NP  hydrochlorothiazide  (HYDRODIURIL ) 25 MG tablet Take 1 tablet (25 mg total) by mouth daily. Must have office visit for refills 06/20/20 08/13/20  Gladis Elsie BROCKS, PA-C     Family History  Problem Relation Age of Onset   Colon polyps Mother    Cirrhosis Father    Cancer Maternal  Grandmother 41       breast   Breast cancer Maternal Grandmother    Colon cancer Neg Hx    Esophageal cancer Neg Hx    Rectal cancer Neg Hx    Stomach cancer Neg Hx     Social History   Socioeconomic History   Marital status: Single    Spouse name: Not on file   Number of children: 4   Years of education: 14   Highest education level: Some college, no degree  Occupational History   Occupation: Dietary Supervisor    Comment: [employer]  Tobacco Use   Smoking status: Former    Current packs/day: 0.00    Average packs/day: 0.5 packs/day for 4.0 years (2.0 ttl pk-yrs)    Types: Cigarettes    Start date: 06/04/2013    Quit date: 06/04/2017    Years since quitting: 6.6    Passive exposure: Past   Smokeless tobacco: Former  Building Services Engineer status: Never Used  Substance and Sexual Activity   Alcohol use: No   Drug use: Yes    Types: Marijuana    Comment: 09/30/20 maybe once a week   Sexual activity: Yes    Birth control/protection: Surgical  Other Topics Concern   Not on file  Social History Narrative   Patient lives at home with children.    Patient has 4 children.    Patient is single.    Patient has 2 years of college.    Patient is right handed.    Patient is working full time. [employer].    Social Drivers of Corporate Investment Banker Strain: Not on file  Food Insecurity: No Food Insecurity (04/10/2022)   Hunger Vital Sign    Worried About Running Out of Food in the Last Year: Never true    Ran Out of Food in the Last Year: Never true  Transportation Needs: No Transportation Needs (04/10/2022)   PRAPARE - Administrator, Civil Service (Medical): No    Lack of Transportation (Non-Medical): No  Physical Activity: Not on file  Stress: Not on file  Social Connections: Not on file    Review of Systems: A 12 point ROS discussed and pertinent positives are indicated in the HPI above.  All other systems are negative.  Review of  Systems  Vital Signs: There were no vitals taken for this visit.    Physical Exam Constitutional:      General: She is not in acute distress.    Appearance: Normal appearance.  HENT:     Head: Normocephalic and atraumatic.  Eyes:     General: No scleral icterus. Pulmonary:     Effort: Pulmonary effort is normal.  Neurological:     Mental Status: She is alert and oriented to person, place, and time.  Psychiatric:  Mood and Affect: Mood normal.        Behavior: Behavior normal.       Imaging: No results found.  Labs:  CBC: Recent Labs    05/17/23 1551 10/22/23 1424 12/05/23 0855  WBC 7.3 7.1 7.8  HGB 12.7 12.6 12.8  HCT 38.9 39.1 39.8  PLT 434 399 392    COAGS: No results for input(s): INR, APTT in the last 8760 hours.  BMP: Recent Labs    05/17/23 1551 11/02/23 1654 12/05/23 0855  NA 141 140 138  K 3.6 4.3 3.8  CL 102 103 106  CO2 24 23 22   GLUCOSE 120* 82 110*  BUN 10 13 11   CALCIUM  9.3 9.4 8.7*  CREATININE 0.67 1.06* 0.71  GFRNONAA  --   --  >60    LIVER FUNCTION TESTS: Recent Labs    05/17/23 1551 12/05/23 0855  BILITOT <0.2 0.4  AST 14 18  ALT 22 24  ALKPHOS 79 65  PROT 6.9 6.9  ALBUMIN 3.9 3.6    TUMOR MARKERS: No results for input(s): AFPTM, CEA, CA199, CHROMGRNA in the last 8760 hours.  Assessment and Plan:  Very pleasant 48 yo female with a history of symptomatic and bulky uterine fibroids now 8 months post UAE.  She reports 100% improvement and is pleased with her outcome.   No further scheduled f/u but she knows she can call and be seen at any time in the future.     Electronically Signed: Wilkie MARLA Lent 02/10/2024, 4:11 PM   This encounter was conducted via the [employer] providing interactive audio and visual communication.  The patient provided verbal consent to conduct a virtual appointment.  The patient was located at their primary residence during this encounter.   I spent a  total of  15 Minutes in face to face in clinical consultation, greater than 50% of which was counseling/coordinating care for uterine fibroids

## 2024-02-24 ENCOUNTER — Ambulatory Visit: Admitting: Internal Medicine

## 2024-03-07 ENCOUNTER — Ambulatory Visit: Attending: Cardiology

## 2024-03-07 DIAGNOSIS — I639 Cerebral infarction, unspecified: Secondary | ICD-10-CM

## 2024-03-15 ENCOUNTER — Ambulatory Visit: Attending: Nurse Practitioner | Admitting: Nurse Practitioner

## 2024-03-15 VITALS — BP 135/87 | HR 87 | Resp 18 | Ht 67.0 in | Wt 234.6 lb

## 2024-03-15 DIAGNOSIS — Z1231 Encounter for screening mammogram for malignant neoplasm of breast: Secondary | ICD-10-CM

## 2024-03-15 DIAGNOSIS — E559 Vitamin D deficiency, unspecified: Secondary | ICD-10-CM | POA: Diagnosis not present

## 2024-03-15 DIAGNOSIS — K219 Gastro-esophageal reflux disease without esophagitis: Secondary | ICD-10-CM | POA: Diagnosis not present

## 2024-03-15 DIAGNOSIS — D500 Iron deficiency anemia secondary to blood loss (chronic): Secondary | ICD-10-CM

## 2024-03-15 DIAGNOSIS — I10 Essential (primary) hypertension: Secondary | ICD-10-CM

## 2024-03-15 MED ORDER — PANTOPRAZOLE SODIUM 40 MG PO TBEC: 40.0000 mg | DELAYED_RELEASE_TABLET | Freq: Every day | ORAL | 0 refills | Status: AC

## 2024-03-15 MED ORDER — LOSARTAN POTASSIUM 50 MG PO TABS
50.0000 mg | ORAL_TABLET | Freq: Every day | ORAL | 1 refills | Status: AC
  Filled 2024-05-09: qty 90, 90d supply, fill #0

## 2024-03-15 NOTE — Progress Notes (Unsigned)
 Assessment & Plan:  Connie Herrera was seen today for hypertension.  Diagnoses and all orders for this visit:  Primary hypertension -     losartan  (COZAAR ) 50 MG tablet; Take 1 tablet (50 mg total) by mouth daily. -     Basic metabolic panel with GFR  Encounter for screening mammogram for malignant neoplasm of breast -     MM 3D SCREENING MAMMOGRAM BILATERAL BREAST; Future  Gastroesophageal reflux disease, unspecified whether esophagitis present -     pantoprazole  (PROTONIX ) 40 MG tablet; Take 1 tablet (40 mg total) by mouth daily.  Vitamin D  deficiency disease -     VITAMIN D  25 Hydroxy (Vit-D Deficiency, Fractures)  Iron deficiency anemia due to chronic blood loss -     CBC with Differential    Patient has been counseled on age-appropriate routine health concerns for screening and prevention. These are reviewed and up-to-date. Referrals have been placed accordingly. Immunizations are up-to-date or declined.    Subjective:   Chief Complaint  Patient presents with   Hypertension    Connie Herrera 48 y.o. female presents to office today      BP Readings from Last 3 Encounters:  03/15/24 135/87  02/07/24 128/85  01/20/24 118/81     ROS  Past Medical History:  Diagnosis Date   Brain lesion    Carotid artery occlusion    COVID-19 04/2020   CVA (cerebral vascular accident) (HCC) 04/2022   Diplopia    Hypertension    Iron deficiency anemia     Past Surgical History:  Procedure Laterality Date   AORTOGRAM N/A 04/14/2022   Procedure: DIAGNOSTIC AORTOGRAM;  Surgeon: Lanis Fonda BRAVO, MD;  Location: [facility];  Service: Vascular;  Laterality: N/A;   BUBBLE STUDY  06/15/2022   Procedure: BUBBLE STUDY;  Surgeon: Alvan Ronal BRAVO, MD;  Location: [facility] ENDOSCOPY;  Service: Cardiovascular;;   ENDARTERECTOMY Right 04/14/2022   Procedure: CAROTID CUTDOWN WITH BRACHIOCEPHALIC STENTING, WITH ULTRASOUND OF THE RIGHT FEMORAL VEIN;  Surgeon: Lanis Fonda BRAVO, MD;  Location:  [facility] OR;  Service: Vascular;  Laterality: Right;   IR EMBO TUMOR ORGAN ISCHEMIA INFARCT INC GUIDE ROADMAPPING  06/08/2023   IR RADIOLOGIST EVAL & MGMT  05/20/2023   IR RADIOLOGIST EVAL & MGMT  06/22/2023   TEE WITHOUT CARDIOVERSION N/A 06/15/2022   Procedure: TRANSESOPHAGEAL ECHOCARDIOGRAM (TEE);  Surgeon: Alvan Ronal BRAVO, MD;  Location: [facility] ENDOSCOPY;  Service: Cardiovascular;  Laterality: N/A;   TUBAL LIGATION      Family History  Problem Relation Age of Onset   Colon polyps Mother    Cirrhosis Father    Cancer Maternal Grandmother 27       breast   Breast cancer Maternal Grandmother    Colon cancer Neg Hx    Esophageal cancer Neg Hx    Rectal cancer Neg Hx    Stomach cancer Neg Hx     Social History Reviewed with no changes to be made today.   Outpatient Medications Prior to Visit  Medication Sig Dispense Refill   acyclovir  (ZOVIRAX ) 400 MG tablet TAKE 1 TABLET BY MOUTH 3 TIMES DAILY FOR 5 DAYS. 15 tablet 1   amLODipine  (NORVASC ) 10 MG tablet Take 1 tablet (10 mg total) by mouth daily. 90 tablet 3   aspirin  EC 81 MG tablet Take 1 tablet (81 mg total) by mouth daily. Take daily at 6 am. Swallow whole. 90 tablet 3   Blood Pressure Monitor DEVI Please provide patient with insurance approved blood pressure  monitor 1 each 0   ferrous sulfate  325 (65 FE) MG EC tablet Take 1 tablet (325 mg total) by mouth 2 (two) times daily. 180 tablet 3   gabapentin  (NEURONTIN ) 100 MG capsule Take 1-4 capsules (100-400 mg total) by mouth at bedtime as needed. 50 capsule 0   naloxone  (NARCAN ) nasal spray 4 mg/0.1 mL Use 1 spray every 3 minutes, spray 1 dose into ONE nostril, alternate nostrils with each dose until help arrives 2 each 0   ondansetron  (ZOFRAN ) 8 MG tablet Take 1 tablet (8 mg total) by mouth every 8 (eight) hours as needed for nausea or vomiting. 30 tablet 0   promethazine  (PHENERGAN ) 12.5 MG tablet Take 1 tablet (12.5 mg total) by mouth every 4 (four) hours as needed for  nausea or vomiting. 30 tablet 0   rosuvastatin  (CRESTOR ) 10 MG tablet TAKE 1 TABLET BY MOUTH EVERY DAY 90 tablet 3   triamcinolone  ointment (KENALOG ) 0.1 % Apply topically 2 (two) times daily. 30 g 3   losartan  (COZAAR ) 50 MG tablet Take 1 tablet (50 mg total) by mouth daily. 90 tablet 1   pantoprazole  (PROTONIX ) 40 MG tablet Take 1 tablet (40 mg total) by mouth daily. 90 tablet 0   No facility-administered medications prior to visit.    No Known Allergies     Objective:    BP 135/87 (BP Location: Left Arm, Patient Position: Sitting, Cuff Size: Normal)   Pulse 87   Resp 18   Ht 5' 7 (1.702 m)   Wt 234 lb 9.6 oz (106.4 kg)   SpO2 99%   BMI 36.74 kg/m  Wt Readings from Last 3 Encounters:  03/15/24 234 lb 9.6 oz (106.4 kg)  02/07/24 236 lb 6.4 oz (107.2 kg)  01/20/24 236 lb 3.2 oz (107.1 kg)    Physical Exam       Patient has been counseled extensively about nutrition and exercise as well as the importance of adherence with medications and regular follow-up. The patient was given clear instructions to go to ER or return to medical center if symptoms don't improve, worsen or new problems develop. The patient verbalized understanding.   Follow-up: No follow-ups on file.   Haze LELON Servant, FNP-BC [facility] [place], [place] [phone]   03/15/2024, 4:24 PM

## 2024-03-16 LAB — CBC WITH DIFFERENTIAL/PLATELET
Basophils Absolute: 0 x10E3/uL (ref 0.0–0.2)
Basos: 0 %
EOS (ABSOLUTE): 0.1 x10E3/uL (ref 0.0–0.4)
Eos: 1 %
Hematocrit: 39.6 % (ref 34.0–46.6)
Hemoglobin: 12.6 g/dL (ref 11.1–15.9)
Immature Grans (Abs): 0 x10E3/uL (ref 0.0–0.1)
Immature Granulocytes: 0 %
Lymphocytes Absolute: 2.1 x10E3/uL (ref 0.7–3.1)
Lymphs: 30 %
MCH: 27.6 pg (ref 26.6–33.0)
MCHC: 31.8 g/dL (ref 31.5–35.7)
MCV: 87 fL (ref 79–97)
Monocytes Absolute: 0.5 x10E3/uL (ref 0.1–0.9)
Monocytes: 7 %
Neutrophils Absolute: 4.3 x10E3/uL (ref 1.4–7.0)
Neutrophils: 62 %
Platelets: 426 x10E3/uL (ref 150–450)
RBC: 4.56 x10E6/uL (ref 3.77–5.28)
RDW: 13.1 % (ref 11.7–15.4)
WBC: 6.9 x10E3/uL (ref 3.4–10.8)

## 2024-03-16 LAB — BASIC METABOLIC PANEL WITH GFR
BUN/Creatinine Ratio: 16 (ref 9–23)
BUN: 12 mg/dL (ref 6–24)
CO2: 21 mmol/L (ref 20–29)
Calcium: 9.3 mg/dL (ref 8.7–10.2)
Chloride: 104 mmol/L (ref 96–106)
Creatinine, Ser: 0.74 mg/dL (ref 0.57–1.00)
Glucose: 106 mg/dL — ABNORMAL HIGH (ref 70–99)
Potassium: 3.4 mmol/L — ABNORMAL LOW (ref 3.5–5.2)
Sodium: 140 mmol/L (ref 134–144)
eGFR: 100 mL/min/1.73 (ref >59)

## 2024-03-16 LAB — VITAMIN D 25 HYDROXY (VIT D DEFICIENCY, FRACTURES): Vit D, 25-Hydroxy: 35.4 ng/mL (ref 30.0–100.0)

## 2024-03-25 ENCOUNTER — Ambulatory Visit: Payer: Self-pay | Admitting: Nurse Practitioner

## 2024-03-28 ENCOUNTER — Ambulatory Visit

## 2024-03-28 ENCOUNTER — Encounter: Payer: Self-pay | Admitting: Nurse Practitioner

## 2024-03-28 ENCOUNTER — Inpatient Hospital Stay: Admission: RE | Admit: 2024-03-28 | Source: Ambulatory Visit

## 2024-04-10 DIAGNOSIS — I639 Cerebral infarction, unspecified: Secondary | ICD-10-CM | POA: Diagnosis not present

## 2024-04-11 ENCOUNTER — Ambulatory Visit: Payer: Self-pay | Admitting: Cardiology

## 2024-04-25 ENCOUNTER — Ambulatory Visit

## 2024-04-25 ENCOUNTER — Other Ambulatory Visit: Payer: Self-pay | Admitting: Nurse Practitioner

## 2024-04-25 DIAGNOSIS — B009 Herpesviral infection, unspecified: Secondary | ICD-10-CM

## 2024-05-03 ENCOUNTER — Other Ambulatory Visit (HOSPITAL_COMMUNITY): Payer: Self-pay

## 2024-05-03 ENCOUNTER — Telehealth: Admitting: Physician Assistant

## 2024-05-03 ENCOUNTER — Ambulatory Visit
Admission: RE | Admit: 2024-05-03 | Discharge: 2024-05-03 | Disposition: A | Source: Ambulatory Visit | Attending: Nurse Practitioner

## 2024-05-03 DIAGNOSIS — Z1231 Encounter for screening mammogram for malignant neoplasm of breast: Secondary | ICD-10-CM

## 2024-05-03 DIAGNOSIS — B3731 Acute candidiasis of vulva and vagina: Secondary | ICD-10-CM | POA: Diagnosis not present

## 2024-05-03 MED ORDER — FLUCONAZOLE 150 MG PO TABS
150.0000 mg | ORAL_TABLET | ORAL | 0 refills | Status: AC | PRN
  Filled 2024-05-03: qty 2, 6d supply, fill #0

## 2024-05-03 NOTE — Progress Notes (Signed)

## 2024-05-09 ENCOUNTER — Other Ambulatory Visit (HOSPITAL_COMMUNITY): Payer: Self-pay
